# Patient Record
Sex: Female | Born: 1960 | Race: White | Hispanic: No | Smoking: Former smoker
Health system: Southern US, Community
[De-identification: ages and names within clinical notes are randomized; demographics above are authoritative.]

## PROBLEM LIST (undated history)

## (undated) DIAGNOSIS — I491 Atrial premature depolarization: Secondary | ICD-10-CM

## (undated) DIAGNOSIS — I493 Ventricular premature depolarization: Secondary | ICD-10-CM

## (undated) DIAGNOSIS — M545 Low back pain, unspecified: Secondary | ICD-10-CM

## (undated) DIAGNOSIS — I509 Heart failure, unspecified: Secondary | ICD-10-CM

## (undated) DIAGNOSIS — J40 Bronchitis, not specified as acute or chronic: Secondary | ICD-10-CM

## (undated) DIAGNOSIS — I38 Endocarditis, valve unspecified: Secondary | ICD-10-CM

## (undated) DIAGNOSIS — F419 Anxiety disorder, unspecified: Secondary | ICD-10-CM

## (undated) DIAGNOSIS — J9611 Chronic respiratory failure with hypoxia: Secondary | ICD-10-CM

## (undated) DIAGNOSIS — Z8701 Personal history of pneumonia (recurrent): Secondary | ICD-10-CM

## (undated) DIAGNOSIS — R002 Palpitations: Secondary | ICD-10-CM

## (undated) DIAGNOSIS — R001 Bradycardia, unspecified: Secondary | ICD-10-CM

## (undated) DIAGNOSIS — M5030 Other cervical disc degeneration, unspecified cervical region: Secondary | ICD-10-CM

## (undated) DIAGNOSIS — H9190 Unspecified hearing loss, unspecified ear: Secondary | ICD-10-CM

## (undated) DIAGNOSIS — I4719 Other supraventricular tachycardia: Secondary | ICD-10-CM

## (undated) DIAGNOSIS — E875 Hyperkalemia: Secondary | ICD-10-CM

## (undated) DIAGNOSIS — J449 Chronic obstructive pulmonary disease, unspecified: Secondary | ICD-10-CM

## (undated) DIAGNOSIS — I719 Aortic aneurysm of unspecified site, without rupture: Secondary | ICD-10-CM

## (undated) DIAGNOSIS — G8929 Other chronic pain: Secondary | ICD-10-CM

## (undated) DIAGNOSIS — W57XXXA Bitten or stung by nonvenomous insect and other nonvenomous arthropods, initial encounter: Secondary | ICD-10-CM

## (undated) DIAGNOSIS — I351 Nonrheumatic aortic (valve) insufficiency: Secondary | ICD-10-CM

## (undated) DIAGNOSIS — I2510 Atherosclerotic heart disease of native coronary artery without angina pectoris: Secondary | ICD-10-CM

## (undated) DIAGNOSIS — R0602 Shortness of breath: Secondary | ICD-10-CM

## (undated) DIAGNOSIS — R58 Hemorrhage, not elsewhere classified: Secondary | ICD-10-CM

## (undated) DIAGNOSIS — I4892 Unspecified atrial flutter: Secondary | ICD-10-CM

## (undated) DIAGNOSIS — R350 Frequency of micturition: Secondary | ICD-10-CM

## (undated) DIAGNOSIS — I5032 Chronic diastolic (congestive) heart failure: Secondary | ICD-10-CM

## (undated) DIAGNOSIS — I350 Nonrheumatic aortic (valve) stenosis: Secondary | ICD-10-CM

## (undated) DIAGNOSIS — I471 Supraventricular tachycardia: Secondary | ICD-10-CM

## (undated) DIAGNOSIS — R112 Nausea with vomiting, unspecified: Secondary | ICD-10-CM

## (undated) DIAGNOSIS — F32A Depression, unspecified: Secondary | ICD-10-CM

## (undated) DIAGNOSIS — M1990 Unspecified osteoarthritis, unspecified site: Secondary | ICD-10-CM

## (undated) DIAGNOSIS — F329 Major depressive disorder, single episode, unspecified: Secondary | ICD-10-CM

## (undated) DIAGNOSIS — I499 Cardiac arrhythmia, unspecified: Secondary | ICD-10-CM

## (undated) DIAGNOSIS — Z9889 Other specified postprocedural states: Secondary | ICD-10-CM

## (undated) DIAGNOSIS — G43909 Migraine, unspecified, not intractable, without status migrainosus: Secondary | ICD-10-CM

## (undated) DIAGNOSIS — N393 Stress incontinence (female) (male): Secondary | ICD-10-CM

## (undated) DIAGNOSIS — K219 Gastro-esophageal reflux disease without esophagitis: Secondary | ICD-10-CM

## (undated) HISTORY — DX: Chronic respiratory failure with hypoxia: J96.11

## (undated) HISTORY — PX: ABDOMINAL HYSTERECTOMY: SHX81

## (undated) HISTORY — DX: Unspecified atrial flutter: I48.92

## (undated) HISTORY — DX: Heart failure, unspecified: I50.9

## (undated) HISTORY — DX: Supraventricular tachycardia: I47.1

## (undated) HISTORY — DX: Hemorrhage, not elsewhere classified: R58

## (undated) HISTORY — DX: Chronic diastolic (congestive) heart failure: I50.32

## (undated) HISTORY — PX: TYMPANOSTOMY TUBE PLACEMENT: SHX32

## (undated) HISTORY — DX: Bitten or stung by nonvenomous insect and other nonvenomous arthropods, initial encounter: W57.XXXA

## (undated) HISTORY — DX: Endocarditis, valve unspecified: I38

## (undated) HISTORY — PX: ARTHROSCOPIC REPAIR ACL: SUR80

## (undated) HISTORY — DX: Other chronic pain: G89.29

## (undated) HISTORY — PX: TUBAL LIGATION: SHX77

## (undated) HISTORY — DX: Nonrheumatic aortic (valve) insufficiency: I35.1

## (undated) HISTORY — DX: Atherosclerotic heart disease of native coronary artery without angina pectoris: I25.10

## (undated) HISTORY — DX: Chronic obstructive pulmonary disease, unspecified: J44.9

## (undated) HISTORY — DX: Other supraventricular tachycardia: I47.19

## (undated) HISTORY — PX: ANTERIOR CRUCIATE LIGAMENT REPAIR: SHX115

## (undated) HISTORY — PX: KNEE ARTHROSCOPY: SUR90

## (undated) HISTORY — DX: Bradycardia, unspecified: R00.1

## (undated) HISTORY — DX: Low back pain, unspecified: M54.50

## (undated) HISTORY — PX: CHOLECYSTECTOMY: SHX55

## (undated) HISTORY — PX: MYRINGOPLASTY W/ FAT GRAFT: SHX2058

## (undated) HISTORY — DX: Hyperkalemia: E87.5

## (undated) HISTORY — PX: SIGMOIDOSCOPY: SUR1295

## (undated) HISTORY — PX: AORTIC VALVE REPLACEMENT: SHX41

## (undated) HISTORY — DX: Nonrheumatic aortic (valve) stenosis: I35.0

## (undated) HISTORY — DX: Ventricular premature depolarization: I49.3

## (undated) HISTORY — PX: DIAGNOSTIC LAPAROSCOPY: SUR761

## (undated) HISTORY — DX: Bronchitis, not specified as acute or chronic: J40

## (undated) HISTORY — DX: Migraine, unspecified, not intractable, without status migrainosus: G43.909

## (undated) HISTORY — DX: Palpitations: R00.2

## (undated) HISTORY — DX: Unspecified osteoarthritis, unspecified site: M19.90

## (undated) HISTORY — DX: Atrial premature depolarization: I49.1

---
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
## 1997-08-06 ENCOUNTER — Ambulatory Visit: Admission: RE | Admit: 1997-08-06 | Discharge: 1997-08-06 | Payer: Self-pay | Admitting: Pulmonary Disease

## 2000-08-21 ENCOUNTER — Ambulatory Visit (HOSPITAL_COMMUNITY): Admission: RE | Admit: 2000-08-21 | Discharge: 2000-08-21 | Payer: Self-pay | Admitting: Cardiology

## 2000-12-29 ENCOUNTER — Ambulatory Visit (HOSPITAL_COMMUNITY): Admission: RE | Admit: 2000-12-29 | Discharge: 2000-12-29 | Payer: Self-pay | Admitting: Pulmonary Disease

## 2002-09-20 ENCOUNTER — Ambulatory Visit (HOSPITAL_COMMUNITY): Admission: RE | Admit: 2002-09-20 | Discharge: 2002-09-20 | Payer: Self-pay | Admitting: Cardiology

## 2002-09-20 ENCOUNTER — Ambulatory Visit (HOSPITAL_COMMUNITY): Admission: RE | Admit: 2002-09-20 | Discharge: 2002-09-20 | Payer: Self-pay | Admitting: Pulmonary Disease

## 2002-12-19 ENCOUNTER — Encounter: Payer: Self-pay | Admitting: Emergency Medicine

## 2002-12-19 ENCOUNTER — Emergency Department (HOSPITAL_COMMUNITY): Admission: EM | Admit: 2002-12-19 | Discharge: 2002-12-19 | Payer: Self-pay | Admitting: Emergency Medicine

## 2003-03-01 HISTORY — PX: TONSILLECTOMY: SUR1361

## 2004-03-05 ENCOUNTER — Ambulatory Visit (HOSPITAL_COMMUNITY): Admission: RE | Admit: 2004-03-05 | Discharge: 2004-03-05 | Payer: Self-pay | Admitting: Pulmonary Disease

## 2004-10-19 ENCOUNTER — Ambulatory Visit: Payer: Self-pay | Admitting: Cardiology

## 2004-11-04 ENCOUNTER — Ambulatory Visit: Payer: Self-pay | Admitting: Cardiology

## 2004-11-04 ENCOUNTER — Ambulatory Visit: Payer: Self-pay

## 2005-09-09 ENCOUNTER — Ambulatory Visit: Payer: Self-pay | Admitting: Gastroenterology

## 2005-09-19 ENCOUNTER — Ambulatory Visit: Payer: Self-pay | Admitting: Gastroenterology

## 2005-09-19 ENCOUNTER — Ambulatory Visit (HOSPITAL_COMMUNITY): Admission: RE | Admit: 2005-09-19 | Discharge: 2005-09-19 | Payer: Self-pay | Admitting: Gastroenterology

## 2005-10-13 ENCOUNTER — Ambulatory Visit (HOSPITAL_COMMUNITY): Admission: RE | Admit: 2005-10-13 | Discharge: 2005-10-13 | Payer: Self-pay | Admitting: Gastroenterology

## 2005-10-13 HISTORY — PX: COLONOSCOPY: SHX174

## 2005-11-09 ENCOUNTER — Encounter
Admission: RE | Admit: 2005-11-09 | Discharge: 2006-02-07 | Payer: Self-pay | Admitting: Physical Medicine & Rehabilitation

## 2005-11-09 ENCOUNTER — Ambulatory Visit: Payer: Self-pay | Admitting: Physical Medicine & Rehabilitation

## 2005-12-13 ENCOUNTER — Ambulatory Visit: Payer: Self-pay | Admitting: Gastroenterology

## 2005-12-20 ENCOUNTER — Ambulatory Visit: Payer: Self-pay | Admitting: Cardiology

## 2005-12-29 ENCOUNTER — Ambulatory Visit: Payer: Self-pay | Admitting: Cardiology

## 2006-01-02 ENCOUNTER — Ambulatory Visit: Payer: Self-pay | Admitting: Cardiology

## 2006-01-12 ENCOUNTER — Ambulatory Visit: Payer: Self-pay | Admitting: Cardiology

## 2006-01-17 ENCOUNTER — Ambulatory Visit: Payer: Self-pay | Admitting: Gastroenterology

## 2006-01-26 ENCOUNTER — Ambulatory Visit: Payer: Self-pay | Admitting: Physical Medicine & Rehabilitation

## 2006-02-07 ENCOUNTER — Ambulatory Visit: Payer: Self-pay | Admitting: Cardiology

## 2006-02-16 ENCOUNTER — Encounter (INDEPENDENT_AMBULATORY_CARE_PROVIDER_SITE_OTHER): Payer: Self-pay | Admitting: Gastroenterology

## 2006-02-16 ENCOUNTER — Ambulatory Visit: Payer: Self-pay | Admitting: Gastroenterology

## 2006-03-02 ENCOUNTER — Ambulatory Visit: Payer: Self-pay | Admitting: Gastroenterology

## 2006-05-26 ENCOUNTER — Encounter
Admission: RE | Admit: 2006-05-26 | Discharge: 2006-08-24 | Payer: Self-pay | Admitting: Physical Medicine & Rehabilitation

## 2006-05-30 ENCOUNTER — Ambulatory Visit: Payer: Self-pay | Admitting: Physical Medicine & Rehabilitation

## 2006-07-19 ENCOUNTER — Ambulatory Visit: Payer: Self-pay | Admitting: Gastroenterology

## 2006-08-23 ENCOUNTER — Ambulatory Visit: Payer: Self-pay | Admitting: Cardiology

## 2006-09-12 ENCOUNTER — Encounter: Payer: Self-pay | Admitting: Cardiology

## 2006-09-12 ENCOUNTER — Ambulatory Visit: Payer: Self-pay

## 2006-09-19 ENCOUNTER — Ambulatory Visit: Payer: Self-pay | Admitting: Cardiology

## 2006-09-19 LAB — CONVERTED CEMR LAB
BUN: 14 mg/dL (ref 6–23)
Bilirubin, Direct: 0.1 mg/dL (ref 0.0–0.3)
Cholesterol: 239 mg/dL (ref 0–200)
GFR calc Af Amer: 87 mL/min
HDL: 40.9 mg/dL (ref >39.0)
Potassium: 3.7 meq/L (ref 3.5–5.1)
Total Bilirubin: 1.2 mg/dL (ref 0.3–1.2)
Total CHOL/HDL Ratio: 5.8

## 2007-07-30 ENCOUNTER — Ambulatory Visit: Payer: Self-pay | Admitting: Cardiology

## 2007-08-08 ENCOUNTER — Ambulatory Visit: Payer: Self-pay

## 2007-08-08 ENCOUNTER — Ambulatory Visit: Payer: Self-pay | Admitting: Cardiology

## 2007-08-08 ENCOUNTER — Encounter: Payer: Self-pay | Admitting: Cardiology

## 2007-08-08 LAB — CONVERTED CEMR LAB
AST: 19 units/L (ref 0–37)
Albumin: 3.8 g/dL (ref 3.5–5.2)
Alkaline Phosphatase: 59 units/L (ref 39–117)
BUN: 12 mg/dL (ref 6–23)
Bilirubin, Direct: 0.1 mg/dL (ref 0.0–0.3)
CO2: 30 meq/L (ref 19–32)
Creatinine, Ser: 0.9 mg/dL (ref 0.4–1.2)
Glucose, Bld: 78 mg/dL (ref 70–99)
LDL Cholesterol: 77 mg/dL (ref 0–99)
Potassium: 4.5 meq/L (ref 3.5–5.1)
Sodium: 137 meq/L (ref 135–145)
Total Bilirubin: 0.7 mg/dL (ref 0.3–1.2)
Total CHOL/HDL Ratio: 3.4
Total Protein: 6.7 g/dL (ref 6.0–8.3)
Triglycerides: 158 mg/dL — ABNORMAL HIGH (ref 0–149)
VLDL: 32 mg/dL (ref 0–40)

## 2007-09-06 ENCOUNTER — Ambulatory Visit: Payer: Self-pay | Admitting: Cardiology

## 2007-10-04 ENCOUNTER — Ambulatory Visit: Payer: Self-pay | Admitting: Cardiology

## 2008-04-03 ENCOUNTER — Ambulatory Visit: Payer: Self-pay | Admitting: Cardiology

## 2008-04-18 ENCOUNTER — Emergency Department (HOSPITAL_COMMUNITY): Admission: EM | Admit: 2008-04-18 | Discharge: 2008-04-18 | Payer: Self-pay | Admitting: Emergency Medicine

## 2008-04-27 ENCOUNTER — Emergency Department (HOSPITAL_COMMUNITY): Admission: EM | Admit: 2008-04-27 | Discharge: 2008-04-27 | Payer: Self-pay | Admitting: Emergency Medicine

## 2008-04-28 ENCOUNTER — Emergency Department (HOSPITAL_COMMUNITY): Admission: EM | Admit: 2008-04-28 | Discharge: 2008-04-28 | Payer: Self-pay | Admitting: Emergency Medicine

## 2008-07-18 ENCOUNTER — Encounter: Admission: RE | Admit: 2008-07-18 | Discharge: 2008-07-18 | Payer: Self-pay | Admitting: Orthopedic Surgery

## 2008-08-21 DIAGNOSIS — J45909 Unspecified asthma, uncomplicated: Secondary | ICD-10-CM | POA: Insufficient documentation

## 2008-08-21 DIAGNOSIS — R002 Palpitations: Secondary | ICD-10-CM | POA: Insufficient documentation

## 2008-08-21 DIAGNOSIS — J449 Chronic obstructive pulmonary disease, unspecified: Secondary | ICD-10-CM | POA: Insufficient documentation

## 2008-08-21 DIAGNOSIS — J40 Bronchitis, not specified as acute or chronic: Secondary | ICD-10-CM | POA: Insufficient documentation

## 2008-08-21 DIAGNOSIS — M129 Arthropathy, unspecified: Secondary | ICD-10-CM | POA: Insufficient documentation

## 2008-08-21 DIAGNOSIS — G43909 Migraine, unspecified, not intractable, without status migrainosus: Secondary | ICD-10-CM | POA: Insufficient documentation

## 2008-08-21 DIAGNOSIS — I38 Endocarditis, valve unspecified: Secondary | ICD-10-CM | POA: Insufficient documentation

## 2008-08-21 DIAGNOSIS — I509 Heart failure, unspecified: Secondary | ICD-10-CM | POA: Insufficient documentation

## 2008-08-29 ENCOUNTER — Ambulatory Visit: Payer: Self-pay | Admitting: Cardiology

## 2008-08-29 DIAGNOSIS — R0989 Other specified symptoms and signs involving the circulatory and respiratory systems: Secondary | ICD-10-CM

## 2008-08-29 DIAGNOSIS — R0609 Other forms of dyspnea: Secondary | ICD-10-CM | POA: Insufficient documentation

## 2008-08-29 DIAGNOSIS — R0602 Shortness of breath: Secondary | ICD-10-CM | POA: Insufficient documentation

## 2008-09-09 ENCOUNTER — Telehealth (INDEPENDENT_AMBULATORY_CARE_PROVIDER_SITE_OTHER): Payer: Self-pay

## 2008-09-10 ENCOUNTER — Encounter: Payer: Self-pay | Admitting: Cardiology

## 2008-09-10 ENCOUNTER — Ambulatory Visit: Payer: Self-pay

## 2008-12-16 ENCOUNTER — Ambulatory Visit: Payer: Self-pay | Admitting: Pain Medicine

## 2008-12-24 ENCOUNTER — Ambulatory Visit: Payer: Self-pay | Admitting: Pain Medicine

## 2008-12-24 ENCOUNTER — Encounter: Payer: Self-pay | Admitting: Cardiology

## 2009-01-27 ENCOUNTER — Ambulatory Visit: Payer: Self-pay | Admitting: Pain Medicine

## 2009-02-02 ENCOUNTER — Ambulatory Visit: Payer: Self-pay | Admitting: Pain Medicine

## 2009-02-25 ENCOUNTER — Ambulatory Visit: Payer: Self-pay | Admitting: Cardiology

## 2009-02-25 DIAGNOSIS — F17200 Nicotine dependence, unspecified, uncomplicated: Secondary | ICD-10-CM | POA: Insufficient documentation

## 2009-02-25 DIAGNOSIS — I712 Thoracic aortic aneurysm, without rupture, unspecified: Secondary | ICD-10-CM | POA: Insufficient documentation

## 2009-03-05 ENCOUNTER — Ambulatory Visit: Payer: Self-pay | Admitting: Pain Medicine

## 2009-07-16 ENCOUNTER — Ambulatory Visit (HOSPITAL_COMMUNITY): Admission: RE | Admit: 2009-07-16 | Discharge: 2009-07-16 | Payer: Self-pay | Admitting: Pulmonary Disease

## 2009-08-13 ENCOUNTER — Ambulatory Visit: Payer: Self-pay | Admitting: Cardiology

## 2009-09-22 ENCOUNTER — Encounter (INDEPENDENT_AMBULATORY_CARE_PROVIDER_SITE_OTHER): Payer: Self-pay | Admitting: *Deleted

## 2009-09-22 ENCOUNTER — Encounter: Payer: Self-pay | Admitting: Cardiology

## 2009-09-22 ENCOUNTER — Ambulatory Visit: Payer: Self-pay

## 2009-09-22 ENCOUNTER — Ambulatory Visit: Payer: Self-pay | Admitting: Cardiovascular Disease

## 2009-09-22 ENCOUNTER — Ambulatory Visit (HOSPITAL_COMMUNITY): Admission: RE | Admit: 2009-09-22 | Discharge: 2009-09-22 | Payer: Self-pay | Admitting: Cardiology

## 2009-10-16 ENCOUNTER — Ambulatory Visit (HOSPITAL_COMMUNITY): Admission: RE | Admit: 2009-10-16 | Discharge: 2009-10-16 | Payer: Self-pay | Admitting: Pulmonary Disease

## 2010-02-12 ENCOUNTER — Ambulatory Visit: Payer: Self-pay | Admitting: Cardiology

## 2010-02-12 ENCOUNTER — Encounter: Payer: Self-pay | Admitting: Cardiology

## 2010-03-21 ENCOUNTER — Encounter: Payer: Self-pay | Admitting: Pulmonary Disease

## 2010-04-01 NOTE — Assessment & Plan Note (Signed)
Summary: per checko ut/sf  Medications Added ZYRTEC ALLERGY 10 MG TABS (CETIRIZINE HCL) 1 tab once daily FLEXERIL 10 MG TABS (CYCLOBENZAPRINE HCL) 2 tab at bedtime HYDROXYZINE HCL 10 MG TABS (HYDROXYZINE HCL) 1 tab once daily QVAR 40 MCG/ACT AERS (BECLOMETHASONE DIPROPIONATE) take 2 puffs once daily MOBIC 15 MG TABS (MELOXICAM) 1 tab once daily        Visit Type:  6 mo f/u Primary Provider:  Shaune Pollack MD  CC:  pt's weight is up 9 lb's since 01/2009.....sob...Marland KitchenMarland KitchenMarland Kitchenedema/feet/legs....cp.  History of Present Illness: Lindsay Bonilla returns today for followup of her multiple cardiac and pulmonary issues.  Please see assessment and plan.  Her dyspnea is actually improved and she is cut way back on smoking. She's also on Xolair which also seems to be helpful.  She denies any palpitations. She's had no syncope or presyncope. She denies any orthopnea, PND or edema.  Current Medications (verified): 1)  Singulair 10 Mg Tabs (Montelukast Sodium) .Marland Kitchen.. 1 Tab Once Daily 2)  Zyrtec Allergy 10 Mg Tabs (Cetirizine Hcl) .Marland Kitchen.. 1 Tab Once Daily 3)  Klor-Con M20 20 Meq Cr-Tabs (Potassium Chloride Crys Cr) .Marland Kitchen.. 1 Tab Two Times A Day 4)  Xolair 150 Mg Solr (Omalizumab) .Marland Kitchen.. 1 Injection 2 Times A Month 5)  Crestor 10 Mg Tabs (Rosuvastatin Calcium) .Marland Kitchen.. 1 Tab Once Daily 6)  Lasix 40 Mg Tabs (Furosemide) .Marland Kitchen.. 1 Tab Two Times A Day 7)  Symbicort 160-4.5 Mcg/act Aero (Budesonide-Formoterol Fumarate) .... Use As Directed 8)  Nexium 40 Mg Cpdr (Esomeprazole Magnesium) .Marland Kitchen.. 1 Tab Two Times A Day 9)  Epipen 0.3 Mg/0.91ml (1:1000) Devi (Epinephrine Hcl (Anaphylaxis)) .... As Needed 10)  Optivar 0.05 % Soln (Azelastine Hcl) .... As Needed 11)  Hydrocodone-Acetaminophen 10-500 Mg Tabs (Hydrocodone-Acetaminophen) .... As Needed 12)  Replax 40mg  .... As Needed 13)  Xanax 1 Mg Tabs (Alprazolam) .... As Needed 14)  Flexeril 10 Mg Tabs (Cyclobenzaprine Hcl) .... 2 Tab At Bedtime 15)  Hydroxyzine Hcl 10 Mg Tabs (Hydroxyzine  Hcl) .Marland Kitchen.. 1 Tab Once Daily 16)  Ventolin Hfa 108 (90 Base) Mcg/act Aers (Albuterol Sulfate) .... Use As Directed 17)  Sertraline Hcl 100 Mg Tabs (Sertraline Hcl) .Marland Kitchen.. 1 Tab Once Daily 18)  Amoxicillin 500 Mg Caps (Amoxicillin) .... Take As Directed For Dental Appt 19)  Qvar 40 Mcg/act Aers (Beclomethasone Dipropionate) .... Take 2 Puffs Once Daily 20)  Mobic 15 Mg Tabs (Meloxicam) .Marland Kitchen.. 1 Tab Once Daily  Allergies: 1)  ! Cleocin 2)  ! Septra 3)  ! Cipro 4)  ! * Avelox 5)  ! Beta Blockers 6)  ! * Bee Stings 7)  ! * Peanuts 8)  ! 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 09/19/2005 Colonoscopy....10/13/2005 aortic valve replacement Cholecystectomy hysterectomy Knee Arthroscopy Tonsillectomy tubal ligation   Family History: Last updated: 09/08/2008 Father: Deceased at 7 ? heart attack Family History of Cancer: Mother..type unkown  Social History: Last updated: 2008/09/08 Tobacco Use - Yes.  Alcohol Use - yes Drug Use - no  Risk Factors: Smoking Status: current (Sep 08, 2008)  Review of Systems       negative other than history of present illness  Vital Signs:  Patient profile:   50 year old female Height:  64 inches Weight:      165 pounds BMI:     28.42 Pulse rate:   74 / minute Pulse rhythm:   irregular BP sitting:   122 / 60  (left arm) Cuff size:   large  Vitals Entered By: Lindsay Bonilla, CMA (August 13, 2009 1:55 PM)  Physical Exam  General:  looks lower than stated age, Head:  normocephalic and atraumatic Eyes:  PERRLA/EOM intact; conjunctiva and lids  normal. Neck:  Neck supple, no JVD. No masses, thyromegaly or abnormal cervical nodes. Chest [name]:  no deformities or breast masses noted Lungs:  less rhonchi and wheezes in usual, decreased breath sounds throughout Heart:  PMI nondisplaced, regular rate and rhythm, normally splitting S2 with aspiration, 3/6 diastolic murmur along the left upper sternal border. No gallop Abdomen:  Bowel sounds positive; abdomen soft and non-tender without masses, organomegaly, or hernias noted. No hepatosplenomegaly. Msk:  Back normal, normal gait. Muscle strength and tone normal. Pulses:  pulses normal in all 4 extremities Extremities:  No clubbing or cyanosis. Neurologic:  Alert and oriented x 3. Skin:  Intact without lesions or rashes. Psych:  Normal affect.   Impression & Recommendations:  Problem # 1:  VALVULAR HEART DISEASE (ICD-424.90) Assessment Unchanged her That diastolic murmur sounds worse. Obtain 2-D echocardiogram has been a year. No change in treatment today. 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 1 tab two times a day  Problem # 3:  * PULMONARY VALVE HOMOGRAFT Assessment: Unchanged  Problem # 4:  TOBACCO USER (ICD-305.1) Assessment: Improved  Problem # 5:  DYSPNEA ON EXERTION (ICD-786.09) Assessment: Improved  Her updated medication list for this problem includes:    Lasix 40 Mg Tabs (Furosemide) .Marland Kitchen... 1 tab two times a day  Problem # 6:  ASCENDING AORTIC ANEURYSM (ICD-441.2) Assessment: Unchanged Ascending aorta dilatation on last echo last year. We'll reevaluate with repeat echo  Other Orders: EKG w/ Interpretation (93000)  Patient Instructions: 1)  Your physician recommends that you schedule a follow-up appointment in: 6 MONTHS WITH DR Kristjan Derner 2)  Your physician recommends that you continue on  your current medications as directed. Please refer to the Current Medication list given to you today. 3)  Your physician has requested that you have an echocardiogram.  Echocardiography is a painless test that uses sound waves to create images of your heart. It provides your doctor with information about the size and shape of your heart and how well your heart's chambers and valves are working.  This procedure takes approximately one hour. There are no restrictions for this procedure.

## 2010-04-01 NOTE — Assessment & Plan Note (Signed)
Summary: 6 m o f/u ./cy  Medications Added SYMBICORT 160-4.5 MCG/ACT AERO (BUDESONIDE-FORMOTEROL FUMARATE) 2 puffs two times a day PATADAY 0.2 % SOLN (OLOPATADINE HCL) as directed PERCOCET 10-325 MG TABS (OXYCODONE-ACETAMINOPHEN) as needed CYMBALTA 60 MG CPEP (DULOXETINE HCL) 1 cap once daily AMBIEN 10 MG TABS (ZOLPIDEM TARTRATE) 1 tab at bedtime OPANA ER 20 MG XR12H-TAB (OXYMORPHONE HCL) 1 tab two times a day METOPROLOL SUCCINATE 25 MG XR24H-TAB (METOPROLOL SUCCINATE) Take one tablet by mouth daily at bedtime        Visit Type:  6 mo f/u Primary Provider:  Shaune Pollack MD  CC:  cold sweats...heart racing...sob...edema/hands yesterday.  History of Present Illness: Ms Lindsay Bonilla comes in today for nocturnal palpitations. There she says her heart races at night and she just can't get any sleep. Ambien is not even helping.  She remembers taking Toprol in the past which was the only thing that helped or palpitations. He is listed as an allergy but she denies any wheezing or any other symptoms of respiratory compromise. She really like to go back on it.  A- stress Myoview in July of 2010 was negative for ischemia and she had a stable echocardiogram in July. I reviewed these results with her. Her aortic insufficiency is only mild. Her ascending aortic root is stable. Findings compared to last year's echocardiogram.  She is now smoking a half-pack service a day as opposed to a couple of PACs. Encouraged her to quit.  Current Medications (verified): 1)  Singulair 10 Mg Tabs (Montelukast Sodium) .Marland Kitchen.. 1 Tab Once Daily 2)  Zyrtec Allergy 10 Mg Tabs (Cetirizine Hcl) .Marland Kitchen.. 1 Tab Once Daily 3)  Klor-Con M20 20 Meq Cr-Tabs (Potassium Chloride Crys Cr) .Marland Kitchen.. 1 Tab Two Times A Day 4)  Xolair 150 Mg Solr (Omalizumab) .Marland Kitchen.. 1 Injection 2 Times A Month 5)  Crestor 10 Mg Tabs (Rosuvastatin Calcium) .Marland Kitchen.. 1 Tab Once Daily 6)  Lasix 40 Mg Tabs (Furosemide) .Marland Kitchen.. 1 Tab Two Times A Day 7)  Symbicort 160-4.5 Mcg/act  Aero (Budesonide-Formoterol Fumarate) .... 2 Puffs Two Times A Day 8)  Nexium 40 Mg Cpdr (Esomeprazole Magnesium) .Marland Kitchen.. 1 Tab Two Times A Day 9)  Epipen 0.3 Mg/0.62ml (1:1000) Devi (Epinephrine Hcl (Anaphylaxis)) .... As Needed 10)  Pataday 0.2 % Soln (Olopatadine Hcl) .... As Directed 11)  Percocet 10-325 Mg Tabs (Oxycodone-Acetaminophen) .... As Needed 12)  Replax 40mg  .... As Needed 13)  Xanax 1 Mg Tabs (Alprazolam) .... As Needed 14)  Flexeril 10 Mg Tabs (Cyclobenzaprine Hcl) .... 2 Tab At Bedtime 15)  Hydroxyzine Hcl 10 Mg Tabs (Hydroxyzine Hcl) .Marland Kitchen.. 1 Tab Once Daily 16)  Ventolin Hfa 108 (90 Base) Mcg/act Aers (Albuterol Sulfate) .... Use As Directed 17)  Cymbalta 60 Mg Cpep (Duloxetine Hcl) .Marland Kitchen.. 1 Cap Once Daily 18)  Amoxicillin 500 Mg Caps (Amoxicillin) .... Take As Directed For Dental Appt 19)  Qvar 40 Mcg/act Aers (Beclomethasone Dipropionate) .... Take 2 Puffs Once Daily 20)  Mobic 15 Mg Tabs (Meloxicam) .Marland Kitchen.. 1 Tab Once Daily 21)  Ambien 10 Mg Tabs (Zolpidem Tartrate) .Marland Kitchen.. 1 Tab At Bedtime 22)  Opana Er 20 Mg Xr12h-Tab (Oxymorphone Hcl) .Marland Kitchen.. 1 Tab Two Times A Day  Allergies: 1)  ! Cleocin 2)  ! Septra 3)  ! Cipro 4)  ! * Avelox 5)  ! Beta Blockers 6)  ! * Bee Stings 7)  ! * Peanuts 8)  ! 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 09/19/2005 Colonoscopy....10/13/2005 aortic valve replacement Cholecystectomy hysterectomy Knee Arthroscopy Tonsillectomy tubal ligation   Family History: Last updated: 09-10-2008 Father: Deceased at 38 ? heart attack Family History of Cancer:  Mother..type unkown  Social History: Last updated: September 10, 2008 Tobacco Use - Yes.  Alcohol Use - yes Drug Use - no  Risk Factors: Smoking Status: current (09-10-08)  Review of Systems       negative other history of present illness  Clinical Reports Reviewed:  Nuclear Study:  09/10/2008:  Excerise capacity: Adenosine study with no exercise  Blood Pressure response: Normal blood pressure response  Clinical symptoms: Chest burn  ECG impression: No significant ST segment change suggestive of ischemia  Overall impression: The images are similar to 2006. There is no scar or ischemia. It is of interest that the nuclear uptake is not uniform. There is question of interference from visceral nulcear material. This gives the appearance of decreased activity in the anterior Tom Rags[place]. But there is no evidence that this is scar or ischemia  Willa Rough, MF, [facility]  11/04/2004:  Interpretation: 1. No clearly diagnostic ST-segment changes noted during adenosine infusion. The patient was symptomatic as reported. No sustained arrhythmias were noted. Small, fixed apical defect that is most likely due to attenuation artifact. There were no large reverisble perfusion defects to suggest ischemia. The ejction fraction is normal at 65%.  Jonelle Sidle, MD, [facility]   Vital Signs:  Patient profile:   50 year old female Height:      64 inches Weight:      164 pounds BMI:     28.25 Pulse rate:   73 / minute Pulse rhythm:   irregular BP sitting:   106 / 60  (left arm) Cuff size:   large  Vitals Entered By: Danielle Rankin, CMA (February 12, 2010 11:22 AM)  Physical Exam  General:  no acute distress, Head:  normocephalic and atraumatic Eyes:  PERRLA/EOM intact; conjunctiva and lids normal. Neck:  Neck supple, no JVD. No masses, thyromegaly or abnormal cervical nodes. Chest Kingsly Kloepfer:  no deformities or breast masses noted Lungs:  no wheezes or rhonchi, decreased breath sounds throughout Heart:  PMI  nondisplaced, regular rate and rhythm, minimal aortic insufficiency heard. S2 splits. No carotid bruits Msk:  Back normal, normal gait. Muscle strength and tone normal. Pulses:  pulses normal in all 4 extremities Extremities:  No clubbing or cyanosis. Neurologic:  Alert and oriented x 3. Skin:  Intact without lesions or rashes. Psych:  Normal affect.   Impression & Recommendations:  Problem # 1:  ASCENDING AORTIC ANEURYSM (ICD-441.2) Assessment Unchanged  Problem # 2:  TOBACCO USER (ICD-305.1) Assessment: Improved  Problem # 3:  DYSPNEA ON EXERTION (ICD-786.09) Assessment: Unchanged  Her updated medication list for this problem includes:    Lasix 40 Mg Tabs (Furosemide) .Marland Kitchen... 1 tab two times a day    Metoprolol Succinate 25 Mg Xr24h-tab (Metoprolol succinate) .Marland Kitchen... 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 1 tab two times a day    Metoprolol Succinate 25 Mg Xr24h-tab (Metoprolol  succinate) .Marland Kitchen... Take one tablet by mouth daily at bedtime  Problem # 6:  PALPITATIONS (ICD-785.1) Assessment: Deteriorated  We will try low-dose beta blocker with metoprolol succinate 25 mg q.h.s. If she has any wheezing she's been instructed to stop altogether.  Her updated medication list for this problem includes:    Metoprolol Succinate 25 Mg Xr24h-tab (Metoprolol succinate) .Marland Kitchen... Take one tablet by mouth daily at bedtime  Other Orders: EKG w/ Interpretation (93000)  Patient Instructions: 1)  Your physician recommends that you schedule a follow-up appointment in: 3 months with Dr. Daleen Squibb 2)  Your physician has recommended you make the following change in your medication:  3)  Your physician discussed the hazards of tobacco use.  Tobacco use  cessation is recommended and techniques and options to help you quit were discussed. Prescriptions: METOPROLOL SUCCINATE 25 MG XR24H-TAB (METOPROLOL SUCCINATE) Take one tablet by mouth daily at bedtime  #30 x 11   Entered by:   Lisabeth Devoid RN   Authorized by:   Gaylord Shih, MD, [facility]   Signed by:   Lisabeth Devoid RN on 02/12/2010   Method used:   Electronically to        [facility]* (retail)       [address]       [place], [place]  [address]       Ph: [phone]       Fax: [phone]   RxID:   [id number]

## 2010-04-01 NOTE — Letter (Signed)
Summary: Outpatient Coinsurance Notice  Outpatient Coinsurance Notice   Imported By: Marylou Mccoy 10/07/2009 17:46:23  _____________________________________________________________________  External Attachment:    Type:   Image     Comment:   External Document

## 2010-04-12 DIAGNOSIS — I359 Nonrheumatic aortic valve disorder, unspecified: Secondary | ICD-10-CM

## 2010-05-13 ENCOUNTER — Ambulatory Visit (INDEPENDENT_AMBULATORY_CARE_PROVIDER_SITE_OTHER): Payer: Medicaid Other | Admitting: Cardiology

## 2010-05-13 ENCOUNTER — Encounter: Payer: Self-pay | Admitting: Cardiology

## 2010-05-13 DIAGNOSIS — F17200 Nicotine dependence, unspecified, uncomplicated: Secondary | ICD-10-CM

## 2010-05-13 DIAGNOSIS — R079 Chest pain, unspecified: Secondary | ICD-10-CM | POA: Insufficient documentation

## 2010-05-18 NOTE — Assessment & Plan Note (Signed)
Summary: f50m/dfg/agh  Medications Added HYDROXYZINE HCL 10 MG TABS (HYDROXYZINE HCL) 1 tab every 6 hours as needed      Allergies Added:   Visit Type:  3 mo follow up, [facility] Primary Provider:  Shaune Pollack MD  CC:  chest pain and pressure few weeks ago. sob. Marland Kitchen  History of Present Illness: [name] returns after post hospital a [facility]. She presented ith [facility] that awoke her during the night. She was admitted and ruled out. She signed out AMA because dissatisfaction with care. I have received no records. She was told she ruled out for MI.  She has had no further CP. She has been under a tremendous amount of stress.  Current Medications (verified): 1)  Singulair 10 Mg Tabs (Montelukast Sodium) .Marland Kitchen.. 1 Tab Once Daily 2)  Zyrtec Allergy 10 Mg Tabs (Cetirizine Hcl) .Marland Kitchen.. 1 Tab Once Daily 3)  Klor-Con M20 20 Meq Cr-Tabs (Potassium Chloride Crys Cr) .Marland Kitchen.. 1 Tab Two Times A Day 4)  Xolair 150 Mg Solr (Omalizumab) .Marland Kitchen.. 1 Injection 2 Times A Month 5)  Crestor 10 Mg Tabs (Rosuvastatin Calcium) .Marland Kitchen.. 1 Tab Once Daily 6)  Lasix 40 Mg Tabs (Furosemide) .Marland Kitchen.. 1 Tab Two Times A Day 7)  Symbicort 160-4.5 Mcg/act Aero (Budesonide-Formoterol Fumarate) .... 2 Puffs Two Times A Day 8)  Nexium 40 Mg Cpdr (Esomeprazole Magnesium) .Marland Kitchen.. 1 Tab Two Times A Day 9)  Epipen 0.3 Mg/0.77ml (1:1000) Devi (Epinephrine Hcl (Anaphylaxis)) .... As Needed 10)  Pataday 0.2 % Soln (Olopatadine Hcl) .... As Directed 11)  Percocet 10-325 Mg Tabs (Oxycodone-Acetaminophen) .... As Needed 12)  Replax 40mg  .... As Needed 13)  Xanax 1 Mg Tabs (Alprazolam) .... As Needed 14)  Flexeril 10 Mg Tabs (Cyclobenzaprine Hcl) .... 2 Tab At Bedtime 15)  Hydroxyzine Hcl 10 Mg Tabs (Hydroxyzine Hcl) .Marland Kitchen.. 1 Tab Every 6 Hours As Needed 16)  Ventolin Hfa 108 (90 Base) Mcg/act Aers (Albuterol Sulfate) .... Use As Directed 17)  Cymbalta 60 Mg Cpep (Duloxetine Hcl) .Marland Kitchen.. 1 Cap Once Daily 18)  Amoxicillin 500 Mg Caps (Amoxicillin) .... Take As  Directed For Dental Appt 19)  Qvar 40 Mcg/act Aers (Beclomethasone Dipropionate) .... Take 2 Puffs Once Daily 20)  Mobic 15 Mg Tabs (Meloxicam) .Marland Kitchen.. 1 Tab Once Daily 21)  Ambien 10 Mg Tabs (Zolpidem Tartrate) .Marland Kitchen.. 1 Tab At Bedtime 22)  Opana Er 20 Mg Xr12h-Tab (Oxymorphone Hcl) .Marland Kitchen.. 1 Tab Two Times A Day 23)  Metoprolol Succinate 25 Mg Xr24h-Tab (Metoprolol Succinate) .... Take One Tablet By Mouth Daily At Bedtime  Allergies (verified): 1)  ! Cleocin 2)  ! Septra 3)  ! Cipro 4)  ! * Avelox 5)  ! Beta Blockers 6)  ! * Bee Stings 7)  ! * Peanuts 8)  ! 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 09/19/2005 Colonoscopy....10/13/2005 aortic valve replacement Cholecystectomy hysterectomy Knee Arthroscopy Tonsillectomy tubal ligation   Family History: Last updated: 26-Aug-2008 Father: Deceased at 47 ? heart attack Family History of Cancer: Mother..type unkown  Social History: Last updated: 2008/08/26 Tobacco Use - Yes.  Alcohol Use - yes Drug Use - no  Risk Factors: Smoking Status: current (08/26/2008)  Review of Systems       negative other than HPI  Vital Signs:  Patient profile:   50 year old female Height:      64 inches Weight:      166.75 pounds BMI:     28.73 Pulse rate:   60 / minute Resp:     18 per minute BP sitting:   102 / 70  (left arm) Cuff size:   large  Vitals Entered By: Celestia Khat, CMA (May 13, 2010 11:32 AM)  Physical Exam  General:  NAD Head:  normocephalic and atraumatic Eyes:  PERRLA/EOM intact; conjunctiva and lids normal. Neck:  Neck supple,  no JVD. No masses, thyromegaly or abnormal cervical nodes. Lungs:  INSP EXP RHONCHI Heart:  RRR, NL S1 AND S2 , No AI heard, no bruits Msk:  Back normal, normal gait. Muscle strength and tone normal. Pulses:  pulses normal in all 4 extremities Extremities:  No clubbing or cyanosis. Neurologic:  Alert and oriented x 3. Skin:  Intact without lesions or rashes. Psych:  anxious.     Problems:  Medical Problems Added: 1)  Dx of Chest Pain-unspecified  (ICD-786.50)  Impression & Recommendations:  Problem # 1:  CHEST PAIN-UNSPECIFIED (ICD-786.50) Assessment Improved Does not sound cardiac. EKG is stable today. Reassurance. Continue omeprazole and stop smoking. Her updated medication list for this problem includes:    Metoprolol Succinate 25 Mg Xr24h-tab (Metoprolol succinate) .Marland Kitchen... Take one tablet by mouth daily at bedtime  Orders: Echocardiogram (Echo)  Problem # 2:  TOBACCO USER (ICD-305.1) Assessment: Unchanged Encouraged to stop.  Problem # 3:  ASCENDING AORTIC ANEURYSM (ICD-441.2) Assessment: Unchanged No AI heard. Post stenotic dilatation. 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 1 tab two times a day    Metoprolol Succinate 25 Mg Xr24h-tab (Metoprolol succinate) .Marland Kitchen... Take one tablet by mouth daily at bedtime  Patient Instructions: 1)  Your physician recommends that you schedule a follow-up appointment in: July 2012 with Dr. Daleen Squibb 2)  Your physician recommends that you continue on your current medications as directed. Please refer to the Current Medication list given to you today. 3)  Your physician has requested that you have an echocardiogram.  Echocardiography is a painless test that uses sound waves to create images of your heart. It  provides your doctor with information about the size and shape of your heart and how well your heart's chambers and valves are working.  This procedure takes approximately one hour. There are no restrictions for this procedure. SAME DAY AS APPT WITH DR. Daleen Squibb

## 2010-06-15 LAB — CBC
HCT: 41.7 % (ref 36.0–46.0)
Hemoglobin: 14.2 g/dL (ref 12.0–15.0)
RBC: 4.63 MIL/uL (ref 3.87–5.11)

## 2010-06-15 LAB — DIFFERENTIAL: Lymphocytes Relative: 40 % (ref 12–46)

## 2010-07-13 NOTE — Assessment & Plan Note (Signed)
A 50 year old female returns today who had sacroiliac injections at last  visit on June 15, 2006.  She states that she actually felt more pain  for a couple of days after the injection but now it has eased off.  She  has some good days, some bad days, and in fact today she feels like her  pain is zero out of 10 at rest but may increase up to 8 out of 10 with  activity.  She has had no significant relief from prior medial branch  blocks as well as L2-L3 transluminal lumbar epidural steroid injections.  She did have an L4-L5 transluminal injection which gave her 2 weeks of  pain relief.  She has had spine surgery evaluation and not felt to be a  good candidate for surgical intervention.   She has had no new medical complications in the interval time.  She  would like, however, for our office to take over pain management in  terms of medication prescriptions.   ALLERGIES:  1. CIPRO.  2. PERCOCET.  3. AVELOX.  4. SEPTRA.  5. CLEOCIN.   She states her current medication for pain is hydrocodone 10/500 q.i.d.  but has not brought in her last prescription bottle.  She is using a  TENS unit at home and has done some physical therapy and has noted this  has been helpful.   PHYSICAL EXAMINATION:  GENERAL:  No acute distress.  Mood and affect  appropriate.  BACK:  Some mild tenderness in PSIS area bilaterally.  She has pain with  forward flexion and extension.  Lower extremity strength and range of  motion is normal and deep tendon reflexes are normal.   IMPRESSION:  Lumbar pain.  It does not appear to be facet mediated.  She  had some relief with sacroiliac injection but it is difficult to say  whether this is true pain generator.  In fact, no obvious pain generator  identified thus far.  She has not had any diskogram but given that she  is felt to need surgery at this time, would not order this to further  evaluate for diskogenic type pain.   PLAN:  1. We will go ahead and continue  physical therapy.  2. We will get a urine drug screen and if showing just the prescribed      and no illicit drugs or undisclosed opiates, can assume      prescription and adjust meds as needed.  3. Given her pain is more episodic, would not use a long-acting      narcotic analgesic.  4. I will see her back in approximately one month.  5. She will bring in her pill bottles next week for the nurses to do a      check on.      Erick Colace, M.D.  Electronically Signed     AEK/MedQ  D:  07/10/2006 16:25:10  T:  07/10/2006 23:28:50  Job #:  [id number]   cc:   Ramon Dredge L. Juanetta Gosling, M.D.  Fax: [phone]

## 2010-07-13 NOTE — Assessment & Plan Note (Signed)
[facility]                            CARDIOLOGY OFFICE NOTE   Lindsay, Bonilla                       MRN:          [id number]  DATE:07/30/2007                            DOB:          1960/12/18    Lindsay Bonilla comes in today for followup.   1. History of congenital bicuspid aortic valve stenosis status post      Ross procedure at [place] in 1998.  A 2-D echocardiogram a year ago      was stable with mild aortic insufficiency.  No significant gradient      across the aortic valve or the pulmonic valve.  Her left      ventricular chamber size was normal as was the function.  There was      mild aortic root dilatation with ascending aortic diameter of 41      mmHg.  There was mild mitral regurgitation.  She is having some      atypical chest pain well localized under the left breast.  This is      not exertion related.  She has dyspnea on exertion, but she is a      heavy smoker and has a history of asthma.  2. History of tachy palpitations under good control.  3. Lower extremity edema, well controlled with Lasix.  4. Hyperlipidemia.  She is now taking Crestor and has been intolerant      of other statins in the past.  She is due lipids.  5. History of asthma.  She has been advised not to take beta blockers.   CURRENT MEDICATIONS:  1. Singulair 10 mg a day.  2. Xyzal 5 mg q.a.m.  3. Potassium 20 mEq b.i.d.  4. Zegerid 2 daily.  5. Pulmicort 1 mg 1 p.o. daily.  6. Xolair shots two times a month.  7. Diltiazem extended release 120 daily.  8. Symbicort b.i.d.  9. Lasix 40 mg p.o. b.i.d.  10.Crestor 10 mg daily.   PHYSICAL EXAMINATION:  VITAL SIGNS:  Blood pressure 110/64, pulse 72 and  regular.  Weight is 166.  Respiratory rate is 20.  HEENT:  Normocephalic atraumatic.  PERRLA.  Extraocular movements  intact.  Sclerae are slightly injected.  Facial symmetry is normal.  Dentition is satisfactory.  She is extremely tan from a tanning bed.  GENERAL:   She smells heavily of cigarettes.  NECK:  Carotid upstrokes were equal bilaterally without bruits, no JVD.  Thyroid is not enlarged.  Neck is supple.  LUNGS:  Revealed inspiratory and expiratory rhonchi.  HEART:  Reveals a nondisplaced PMI.  She has a tambouric S2 with a  diastolic murmur that is louder than I remember.  It is along the left  sternal border and right upper sternal border.  S2 does split with  respiration.  There is no obvious gallop.  ABDOMEN:  Soft, good bowel sounds.  No midline bruit.  No hepatomegaly.  EXTREMITIES:  No cyanosis, clubbing or edema.  Pulses are intact.  NEUROLOGIC:  Exam is intact.   This EKG shows normal sinus rhythm with a rightward axis, right  atrial  enlargement with a pulmonary disease pattern.  There has been no  substantial change from before.   ASSESSMENT AND PLAN:  I am very concerned about this more intense  diastolic murmur.  I am worried she may have more aortic insufficiency.   PLAN:  1. A 2-D echocardiogram.  2. Fasting comprehensive metabolic panel and lipids.  3. I will see her back in several weeks so we can discuss this.  We      will call with results on an interim basis.     Thomas C. Daleen Squibb, MD, [facility]  Electronically Signed    TCW/MedQ  DD: 07/30/2007  DT: 07/30/2007  Job #: [id number]   cc:   Ramon Dredge L. Juanetta Gosling, M.D.

## 2010-07-13 NOTE — Assessment & Plan Note (Signed)
Lindsay Bonilla                            CARDIOLOGY OFFICE NOTE   Lindsay Bonilla, Lindsay Bonilla                       MRN:          [id number]  DATE:10/04/2007                            DOB:          [birth year]    Miel comes in today for followup of her palpitations.  These are  mostly at night.   We placed her on verapamil at 180 mg nightly.  She has had significant  improvement.   She is also walking on a regular basis.  Her family thinks she is over-  walking.   Her blood pressure today is 124/82, her pulse is 84 and regular.  Her  weight is 163, down 6.  GENERAL APPEARANCE:  She looks remarkably good.  LUNGS:  Her physical examination is remarkable for mild  inspiratory/expiratory rhonchi.  HEART:  Regular rate and rhythm.  No gallop.  ABDOMEN:  Abdominal exam is soft.  EXTREMITIES:  There is no edema.  Pulses are intact.   I am delighted that she has gotten a good response to verapamil.  We  have made no changes.  I will see her back in 6 months.     Thomas C. Daleen Squibb, MD, [facility]  Electronically Signed    TCW/MedQ  DD: 10/04/2007  DT: 10/05/2007  Job #: [id number]   cc:   Ramon Dredge L. Juanetta Gosling, M.D.

## 2010-07-13 NOTE — Assessment & Plan Note (Signed)
[place] [place] [facility]                         GASTROENTEROLOGY OFFICE NOTE   Lindsay Bonilla, Lindsay Bonilla                       MRN:          [id number]  DATE:07/19/2006                            DOB:          1960-09-21    Lindsay Bonilla comes in on May 21.  She says she has been having some choking  feeling in her throat.  She says her symptoms have gotten worse  recently.  We talked about her last endoscopic procedure, which revealed  a 2 cm hernia but either with the Schatzki's ring or the stricture.  There is scar tissue, which I think is probably more likely she had some  mild enteritis.  This was done in December of last year.  She has had  this before.  I told her that being in [place], that she could get the  barium swallow tablet there, and she was satisfied with that.  Then if  she needed an endoscopic procedure there, she could get it done by Dr.  Karilyn Cota, or she could come back there, that we would be more than happy  to do it for her.  She seemed to be satisfied with this.  I told I had  the utmost confidence in Dr. Karilyn Cota and that he could certainly do a  satisfactory procedure for her if she was so inclined.   PHYSICAL EXAMINATION:  GENERAL:  She looks about the same.  VITAL SIGNS:  Weight 166.  Blood pressure 120/72.  Pulse 80 and regular.  NECK:  Unremarkable.  HEART:  A grade 2/3 systolic ejection murmur.  EXTREMITIES:  Unremarkable.   IMPRESSION:  1. Gastroesophageal reflux disease with dysphagia related to the      hiatal hernia and stricture.  2. Irritable bowel syndrome with minimal diverticular changes.  3. Aortic valve replacement.  4. Status post hysterectomy and tubal ligation.  5. Multiple allergies.  6. Arthritis.  7. Asthma.  8. History of anxiety and depression.  9. Gastroesophageal reflux disease with dysphagia, as described.  10.History of rectal prolapse.   RECOMMENDATIONS:  Get the barium swallow, as prescribed, and have the  procedure  done, if necessary, as determined from the barium swallow, as  noted.     Ulyess Mort, MD  Electronically Signed    SML/MedQ  DD: 07/19/2006  DT: 07/20/2006  Job #: [id number]

## 2010-07-13 NOTE — Assessment & Plan Note (Signed)
[facility]                            CARDIOLOGY OFFICE NOTE   NAME:ODELL, DANISSA RUNDLE                       MRN:          [id number]  DATE:09/06/2007                            DOB:          Apr 18, 1960    Kealohilani comes back in today to review the results of her echocardiogram  and also because of increased palpitations particularly at night when  she is lying in a tanning bed.   She cannot take beta-blockers because of the history of bronchospasm and  asthma.  Diltiazem really did not help her in the past.   Her echocardiogram was surprisingly stable.  She does have mild-to-  moderate aortic insufficiency, which is louder on exam.  Her pulmonic  valve is stable.  Her LV function is normal.   MEDICATIONS:  1. She currently takes Symbicort 160/4.5 two puffs b.i.d.  2. Lasix 40 mg p.o. b.i.d.  3. Crestor 10 mg a day.  4. Xolair shots 2 times a month.  5. Pulmicort 1 mg q.a.m.  6. Zegerid 2 daily.  7. Potassium 20 mEq b.i.d.  8. Xyzal 5 mg q.a.m.  9. Singulair 10 mg a day.   Her last lipids showed a total cholesterol of 154; triglycerides 158;  and HDL 45, which is actually an improvement.  LDL was 77, this was on  Crestor, which has done a great job for her.  LFTs and chemistries were  normal.  Her potassium was specifically 4.5.   PHYSICAL EXAMINATION:  GENERAL:  Her exam compared to her last exam is  unchanged.  VITAL SIGNS:  Her blood pressure is 115/72, her pulse is 85 and regular,  and weight is 169 up 3.  LUNGS:  Decreased breath sounds, but no rhonchi or wheezes.  HEART:  Regular rate and rhythm.  No extrasystoles.  EXTREMITIES:  No edema.  Pulses were intact.   Unfortunately, I think Persis will not tolerate beta-blockers with her  pulmonary disease.  Diltiazem did not work in the past.  We will try  verapamil SR 180 at bedtime since they happen mostly at night.  I have  told her that hopefully this will help about 75%, but not to  expect  total cessation.   I will see her back in about 4 weeks for careful followup.     Thomas C. Daleen Squibb, MD, [facility]  Electronically Signed    TCW/MedQ  DD: 09/06/2007  DT: 09/06/2007  Job #: [id number]

## 2010-07-13 NOTE — Assessment & Plan Note (Signed)
[facility]                            CARDIOLOGY OFFICE NOTE   Lindsay Bonilla, Lindsay Bonilla                       MRN:          [id number]  DATE:04/03/2008                            DOB:          Sep 30, 1960    Lindsay Bonilla comes in today.  She has been under a tremendous amount of  stress having had her husband leave her on New Year's Day.  She has  other issues at home including a challenging situation with her mentally  challenged son.   She has been smoking a lot more than usual.   She complains of intermittent numbness in all of her fingers in her left  hand.  It is usually in the tips.  Sometimes, it extends up into the  arm.  She has no other associated neurological symptoms.   She carries a diagnosis of valvular heart disease.  Specifically, she  has a history of severe aortic stenosis with bicuspid valve and has had  a previous Ross procedure at [place] in 1998.  Her last echo showed mild-to-  moderate aortic insufficiency with no significant stenosis.  She has a  pulmonary valve homograft.  There was no significant gradient.  EF was  55-60%.   She has had no previous history of stroke or TIAs.  She has no known  carotid disease.  Again, she does smoke, however.  There is no major  history of hypertension.   The verapamil caused her to feel nauseous.  She quit it.  She is having  some palpitations, but this does not seem to be a major issue.   Her meds are unchanged from last visit except she is no longer on  verapamil.   PHYSICAL EXAMINATION:  She looks really tired and stressed.  Her blood  pressure is 122/74.  She has a ruddy complexion.  Her hands and feet  have a very healthy red color tone.  Blood pressure is 122/74, pulse 70  and regular, weight is 168.  HEENT is normal, nonfocal exam.  Neck is  supple.  Carotid upstrokes were equal bilaterally without bruits.  No  JVD.  Thyroid is not enlarged.  Lungs are clear with some expiratory  rhonchi.  She has good pulses in both upper extremities.  There is no  obvious subclavian bruits.  Heart reveals a nondisplaced PMI.  She has a  soft systolic murmur along the left sternal border.  She has a diastolic  murmur that is short, left upper sternal border.  S2 splits.  There is  no right ventricular lift.  Abdominal exam is soft.  Good bowel sounds.  No midline bruits.  Extremities reveal no cyanosis, clubbing, or edema.  Pulses are present but reduced.  She is independent rubor.  There is no  sign of DVT.  Skin is unremarkable.   ASSESSMENT AND PLAN:  I am very concerned that Lindsay Bonilla could be having  some intermittent transient ischemic attacks they could be lacunar in  nature.  This could very well be from her smoking, since she is really  not hypertensive or diabetic.  Her heart is stable at this point in  time.   She stopped taking her aspirin which I have asked her to renew at 81 mg  a day.  She has been told that if she has prolonged numbness in her hand  or her body to report to the emergency room.  I have asked her to  decrease her smoking again.  We have made no other changes in her  medical program.  I will plan on seeing her back again in 6 months.     Thomas C. Daleen Squibb, MD, [facility]  Electronically Signed    TCW/MedQ  DD: 04/03/2008  DT: 04/03/2008  Job #: [id number]

## 2010-07-13 NOTE — Assessment & Plan Note (Signed)
[facility]                            CARDIOLOGY OFFICE NOTE   Lindsay Bonilla, Lindsay Bonilla                       MRN:          [id number]  DATE:08/23/2006                            DOB:          21-Jan-1961    Lindsay Bonilla comes in for management and followup of the following issues.  1. History of congenital aortic stenosis status post Ross procedure at      [facility] in 1998.  Her last 2D echocardiogram was stable.  She is due a      followup.  She has normal left ventricular function and relative      hypokinesia to the septum, probably post surgical.  2. History of tachy palpitations, which have been under fairly good      control.  3. Lower extremity edema requiring sometimes twice a day Lasix.  She      is on potassium supplementation.  4. Hyperlipidemia.  She has been intolerant of Statins and is no      longer taking same.  5. History of asthma.  She has been advised not to take beta blockers.   She had taken verapamil in the past for her tachy palpitations.  She  says it makes her nauseated.  Will try Diltiazem.  She has multiple drug  allergies.   MEDICATIONS:  1. Singulair 10 mg a day.  2. Xyzal 5 mg 1 p.o. q. a.m.  3. Viramist 2 puffs daily.  4. Potassium 20 mEq b.i.d.  5. Lasix 40 mg p.o. daily.  6. Zegerid 40 mg 2 daily.  7. Albuterol neb p.r.n.  8. Pulmicort neb 1 mg p.o. q. a.m.  9. Brovana nebulizer 2 times a day.  10.Xolair shots 2 times a month.  11.She carries an EpiPen.   EXAM:  Blood pressure is 113/70, pulse is 81 and regular, weight is 164,  which is significantly up.  HEENT:  Normocephalic, atraumatic.  PERRLA.  Extraocular movements are  intact.  Sclerae clear.  Facial symmetry is normal.  Dentition is  satisfactory.  Carotid upstrokes are equal bilaterally without bruits.  No JVD.  Thyroid is not enlarged.  Trachea is midline.  LUNGS:  Clear with no rhonchi or wheezes.  HEART:  Normal S1, S2.  No obvious aortic  insufficiency.  She has a soft  systolic murmur along the left sternal border.  ABDOMEN:  Soft with good bowel sounds.  No midline bruit.  No  hepatomegaly.  EXTREMITIES:  No cyanosis, clubbing, or edema.  Pulses are intact.  NEURO:  Intact.  SKIN:  Markedly tan.  No other lesions.   ELECTROCARDIOGRAM:  Essentially normal, except for a rightward axis and  poor R wave progression across the anterior precordium.  This is  unchanged.   ASSESSMENT AND PLAN:  Lindsay Bonilla is doing well.  She really wants to take  Lasix sometimes twice a day because of lower extremity edema.  I have  told her when she does that to take an extra 20 mEq of potassium.   Because she cannot take a beta blocker, but is having palpitations, I  have prescribed Diltiazem XR 120 mg p.o. daily.  Verapamil makes her  nauseated.  I hope she can tolerate this.   I have also arranged for a 2D echocardiogram to follow up on her  congenital aortic stenosis and Ross procedure.   Assuming she is stable, I will see her back in a year.     Thomas C. Daleen Squibb, MD, [facility]  Electronically Signed    TCW/MedQ  DD: 08/23/2006  DT: 08/23/2006  Job #: [id number]   cc:   Ramon Dredge L. Juanetta Gosling, M.D.

## 2010-07-16 NOTE — Assessment & Plan Note (Signed)
[place]                         GASTROENTEROLOGY OFFICE NOTE   JEANNETTA, CERUTTI                       MRN:          [id number]  DATE:03/02/2006                            DOB:          11-25-1960    Lindsay Bonilla comes in March 02, 2006, after endoscopy and sigmoidoscopy.  I  reviewed all of this with her and her findings.  We were not sure what  we would find since she said she was so sleepy.  I told her about her  hiatal hernia and her stricture and/or Schatzki's ring, the gastritis or  antritis, and I explained to her about the ring.  I also told her about  the colon exam being normal as she was worried but that is well.  She  says she is doing fairly well.  She said that she had been taking  Zegerid.  She does not have any more left.  She does get some burping  and belching and we told her that maybe besides the Zegerid we could add  a little Reglan, 1/2 before meals and see if this would give her any  more satisfaction.  I treated her previously with some Xifaxan and some  Flora-Q.  I told her to try the above measures and see how they work and  to let me know what is not improved.  She seemed to be satisfied with  this.  She does take her other medicines including:  Benadryl, Xanax,  Relpax, hydrocodone, Lasix, Flovent, ProAir, Toprol, and potassium as  well as Lasix.     Ulyess Mort, MD  Electronically Signed    SML/MedQ  DD: 03/02/2006  DT: 03/02/2006  Job #: [id number]   cc:   Thomas C. Wall, MD, [facility]

## 2010-07-16 NOTE — Procedures (Signed)
Lindsay Bonilla, Lindsay Bonilla                ACCOUNT NO.:  [id number]   MEDICAL RECORD NO.:  [id number]          PATIENT TYPE:  REC   LOCATION:  [facility]                          FACILITY:  [facility]   PHYSICIAN:  Erick Colace, M.D.DATE OF BIRTH:  11-Nov-1960   DATE OF PROCEDURE:  12/05/2005  DATE OF DISCHARGE:                                 OPERATIVE REPORT   PROCEDURE:  L3-4 left paramedian lumbar epidural steroid injection under  fluoroscopic guidance, contrast-enhanced.   INDICATIONS:  Back pain, mainly axial, only partially relieved by oral  medications.  No significant improvements with lumbar facet injections.   Pain level is 6/10.   Informed consent was obtained after describing risks and benefits of the  procedure with the patient.  These include bleeding, bruising, infection,  loss of bowel and bladder function, temporary or permanent paralysis.  She  elects to proceed and has given written consent.   The patient was placed prone on fluoroscopy table, Betadine prep, sterile  drape.  A 25-gauge 1-1/2 inch needle was used to anesthetize the skin and  subcu with 1% lidocaine x2 mL.  Then an 18-gauge Hustead needle was inserted  into the L4-5 translaminar space, paramedian approach.  AP and lateral  imaging utilized.  Loss of resistance utilized, positive loss of resistance  at 7 cm.  Omnipaque 180 under live fluoroscopy demonstrated a good epidural  spread, no intravascular uptake.  Then a solution containing 1.5 mL of 40  mg/mL Depo-Medrol and 2 mL of 1% MPF lidocaine were injected.  The patient  tolerated the procedure well.  Postinjection pain level was 1/10.  Return in  3-4 weeks for repeat injection.  I have written for her to be off her  classes until I see her back and repeat injection if needed.  Postinjection  instructions given.      Erick Colace, M.D.  Electronically Signed     AEK/MEDQ  D:  12/05/2005 11:32:19  T:  12/06/2005 11:52:57  Job:  [id number]   cc:    Benn Moulder, M.D.   Edward L. Juanetta Gosling, M.D.  Fax: [phone]

## 2010-07-16 NOTE — Group Therapy Note (Signed)
MEDICAL RECORD NUMBER:  [id number]   [name] is a 50 year old female with history of low back pain that she  states is about 20 years in duration.  She does not indicate any abrupt  onset to this pain.  She states that it has gotten worse over the last year  or so.  She indicates there is a pain that is centered in the low back which  intermittently is severe, but she has a constant and less severe pain in the  mid back region.  She states her pain is a 10/10 on average and interferes  with general activity, relationship with others and enjoyment of life at a  9/10 level.  Her sleep is poor.  She walks without an assistive device.  She  drives, she is not employed.  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  She needs assistance with shopping and household duties  but is able to do all her basic self-care.   PAST MEDICAL HISTORY:  As noted above.  Aortic valve replacement in 1998.  Hysterectomy in 1998.  She had ACL repair 2000 on the right, gallbladder  surgery 2006.   SOCIAL HISTORY:  She was married in June, lives with her son.  She smokes a  half a pack a day.   FAMILY HISTORY:  Heart disease and diabetes.  Mother died of cancer  recently.   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  Her thoracic MRI was report unremarkable March 13, 2004; no  significant cord abnormalities or significant abnormalities noted in the  spine.  Her lumbar MRI March 17, 2005, showed superior endplate  abnormalities L1 and L3 thought to be Schmorl's nodes, no disk herniations.  I do not have the original films on these.   She has undergone orthopedic evaluation and has been sent for spine  injections.   EXAMINATION:  Blood pressure 126/86, pulse 80, respirations 18, O2  saturation 98% room air.  In general, no acute distress, mood and affect  appropriate, although frustrated.   Her back has tenderness at the lumbosacral  junction with increases with  extension and is not painful with forward flexion; in fact, she is able to  touch her toes but can lean backward only slightly.  She is able to toe-walk  and heel-walk.  She has full strength in the lower extremities and negative  straight leg raising sign.  She has normal deep tendon reflexes and normal  sensation.   IMPRESSION:  Lumbar axial pain, no radicular component.  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 L4-5 and L5-  S1.   PLAN:  Will do lumbar facet medial branch blocks diagnostically and add  corticosteroid to the solution for a therapeutic effect.  I will see her  back neck week for this, given that she does not have a driver for today's  appointment.      Erick Colace, M.D.  Electronically Signed     AEK/MedQ  D:  11/10/2005 17:56:21  T:  11/11/2005 17:06:00  Job #:  [id number]

## 2010-07-16 NOTE — Procedures (Signed)
NAMEPAULENA, SERVAIS                ACCOUNT NO.:  [id number]   MEDICAL RECORD NO.:  [id number]          PATIENT TYPE:  REC   LOCATION:  [facility]                          FACILITY:  [facility]   PHYSICIAN:  Erick Colace, M.D.DATE OF BIRTH:  [birth year]   DATE OF PROCEDURE:  06/15/2006  DATE OF DISCHARGE:                               OPERATIVE REPORT   PROCEDURE:  Bilateral sacroiliac injection under fluoroscopic guidance.   INDICATIONS:  Buttocks pain unrelieved by hydrocodone.  Has pain with  favor maneuver.  Informed consent was obtained after describing the  risks.  The pain is inhibiting her ability to participate in physical  therapy.   Informed consent was obtained after describing risks and benefits of the  procedure to the patient including bleeding, bruising, infection, loss  of bowel and bladder function, temporary or permanent paralysis.  She  elects proceed and has given her consent.   DESCRIPTION OF PROCEDURE:  The patient placed prone on fluoroscopy  table.  Betadine prep, sterile drape.  Because of discomfort with the  prone position, we added another pillow which helped somewhat Betadine  prep, sterile drape.  A 25-gauge inch and a half needle was used to  incise skin and subcutaneous tissue and  2 mL of lidocaine at each of  two sites.  Then a 25-gauge Tuohy two and half inch spinal needle was  inserted under fluoroscopic guidance.  First is the left SI joint.  AP  and lateral imaging demonstrated proper needle location.  Omnipaque 18 x  1.5 mL demonstrated good joint outline.  No intravascular uptake.  Then  a solution containing 1/2 mL of 40 mL Depo-Medrol and 1 mL of 2% MPF  lidocaine was injected.  The same procedure was repeated on the right  side using same equipment and technique as well as injectate.  The  patient tolerated procedure well.  Preinjection pain level 5/10.  Post  injection 10/10 which she attributes to lying on her stomach.  Will have  her keep  track of her pain.  Follow up in 3 weeks.  Possible  reinjection.  If this procedure was not helpful, would with consider  intracuticular facet injections rather than medial branch blocks given  they were not particularly helpful last time.      Erick Colace, M.D.  Electronically Signed     AEK/MEDQ  D:  06/15/2006 09:37:55  T:  06/15/2006 14:09:17  Job:  [id number]

## 2010-07-16 NOTE — Consult Note (Signed)
NAMELILLION, Lindsay                ACCOUNT NO.:  [id number]   MEDICAL RECORD NO.:  [id number]           PATIENT TYPE:  AMB   LOCATION:                                FACILITY:  [facility]   PHYSICIAN:  Kassie Mends, M.D.      DATE OF BIRTH:  1960-08-24   DATE OF CONSULTATION:  DATE OF DISCHARGE:                                   CONSULTATION   Dear Dr. Ralph Dowdy:   I am seeing Lindsay Bonilla as a new the patient in consultation per your request.  I am seeing her for rectal bleeding.   Lindsay Bonilla is a 50 year old female who has had rectal bleeding off and on  since 1998.  She has complaints of bulging in her rectum when she has a  bowel movement.  She reports being told she had colitis in the past.  She  sees blood when she wipes often.  She sees blood in the toilet one to two  times a month.  She denies any diarrhea or constipation.  She has to one to  two bowel movements a day, and then perhaps couple of days later she will  have no bowel movement.  She has some mild abdominal pain off and on,  associated with her ovarian cyst, and sexual intercourse.  She denies any  rectal pain.  She had an intentional 60-pound weight loss.  She attributes  her rectal itching to a yeast infection.Marland Kitchen   PAST MEDICAL HISTORY:  1.  Asthma.  2.  Allergies.  3.  Ovarian cyst.  4.  Hyperlipidemia.  5.  Anxiety.  6.  Gallstones.  7.  History of rectocele.   PAST SURGICAL HISTORY:  1.  Open heart surgery for valvular disease.  2.  Hysterectomy, with rectocele repair.  3.  Cholecystectomy.  4.  Right knee surgery.   She is allergic to CIPRO, SEPTRA, and CLEOCIN.   MEDICATION LIST:  1.  Lasix 40 mg a day.  2.  Flovent.  3.  Maxair.  4.  Xanax 1 mg 1 q.i.d.  5.  Crestor.  6.  Benadryl.  7.  Vicodin 10/500, 1 q. 6 h. as needed.  8.  Relpax.  9.  Allergy Shots.   She has no family history of colon polyps.  She had an uncle who had colon  cancer at an unknown age.  She denies any breast or uterine cancer in  the  family.  She had a maternal aunt with ovarian cancer.  Her father died at 22  from an MI, and her mother diet at 37 from lung cancer.  She has three  children and is married.  She has been disabled from her heart disease and  asthma.  She smokes half a pack a day.  She denies any alcohol use.   REVIEW OF SYSTEMS:  Per the HPI. Otherwise, all systems are negative.   PHYSICAL EXAMINATION:  VITAL SIGNS:  Weight is 165 pounds, height 5 feet 5  inches, temperature 98, blood pressure 122/80,  pulse 70.  GENERAL:  She is in no apparent  distress, and alert and oriented x4.  HEENT:  Atraumatic, normocephalic.  Pupils equal and reactive to light.  Mouth:  No  oral lesions. Posterior pharynx without erythema or exudate.  NECK:  Full  range of motion, and no lymphadenopathy.  LUNGS:  Clear to auscultation  bilaterally.CARDIOVASCULAR:  Regular rhythm, no murmur, normal S1 and S2.  ABDOMEN:  Bowel sounds are present, soft, nontender, nondistended.  No  rebound or guarding, no hepatosplenomegaly.  NEUROLOGIC:  She has no focal  neurologic deficits.EXTREMITIES:  Without cyanosis, clubbing, or edema.  RECTAL:  Positive for internal hemorrhoids visualized with the Valsalva  manuever, heme negative, no masses felt, good sphincter tone.   Lindsay Bonilla is a 50 year old female who has had rectal bleeding for 8-9 years.  The differential diagnosis includes internal hemorrhoids and rectal polyp  and a low likelihood of proctitis.  Thank you for allowing me to see [name] in consultation.  My list of recommendations follows.   I recommend taken to [facility] on today.  She should be scheduled for a flexible  sigmoidoscopy.  That will be arranged for next week.  I recommend stool  softeners 100 mg b.i.d., and she may titrate to have soft stool.  I do not  want her to strain, and I explained that to her, to have a bowel movement.  She will follow up in 3 months.   Please feel free to contact me with additional  questions, [phone].      Kassie Mends, M.D.  Electronically Signed     SM/MEDQ  D:  09/09/2005  T:  09/09/2005  Job:  [id number]   cc:   Ernestina Penna  Fax: [phone]

## 2010-07-16 NOTE — Procedures (Signed)
Lindsay Bonilla, MORDECAI                           ACCOUNT NO.:  [id number]   MEDICAL RECORD NO.:  [id number]                    PATIENT TYPE:  OUT   LOCATION:                                       FACILITY:   PHYSICIAN:  Vida Roller, M.D.                DATE OF BIRTH:   DATE OF PROCEDURE:  09/20/2002  DATE OF DISCHARGE:                                  ECHOCARDIOGRAM   TAPE NUMBER:  [id number]   TAPE COUNT:  [id number]   CLINICAL INFORMATION:  This is a 50 year old woman status post a Ross  procedure for congenital aortic stenosis.  This is a follow up  echocardiogram.   TECHNICAL QUALITY:  Adequate.   M-MODE TRACINGS:  1. The aorta is 36 mm.  2. The left atrium is 33 mm.  3. The septum is 12 mm.  4. The left ventricular posterior wall is 10 mm.  5. The left ventricular diastolic dimension is 44 mm.  6. The left ventricular systolic dimension is 28 mm.   2-D AND DOPPLER IMAGING:  1. The left ventricle is normal size.  The left ventricular function     estimated at 50-55%.  There is evidence of paradoxical septal motion     which may be related to a bundle branch block.  The septum, however,     thickens normally.  Diastolic function was not assessed.  2. The right ventricle is normal size with normal systolic function.  3. Both atria appear to be normal size.  4. The aortic valve appears to be bioprosthetic.  There is mild     insufficiency in a linear manner from 9 o'clock to 3 o'clock on the clock     face in the short access view.  No stenosis is seen.  5. The mitral valve is morphologically unremarkable with trace-to-mild     insufficiency.  No stenosis is seen.  6. The tricuspid valve is not well seen.  7. The pulmonic valve is not well seen.  8. The ascending aorta appears to be mildly-to-moderately dilated.  It is     clearly postoperative. I am uncertain as to whether or not there was a     conduit placed here.  9.     The pericardial surfaces appear normal.  10.       The inferior vena cava is normal size.  11.      In review of the previous echocardiogram there appears to be a mild     decrement in left ventricular function.                                               Vida Roller, M.D.    JH/MEDQ  D:  09/20/2002  T:  09/20/2002  Job:  [id number]

## 2010-07-16 NOTE — Procedures (Signed)
Lindsay Bonilla, Lindsay Bonilla                ACCOUNT NO.:  [id number]   MEDICAL RECORD NO.:  [id number]          PATIENT TYPE:  REC   LOCATION:  [facility]                          FACILITY:  [facility]   PHYSICIAN:  Erick Colace, M.D.DATE OF BIRTH:  25-Sep-1960   DATE OF PROCEDURE:  11/14/2005  DATE OF DISCHARGE:                                 OPERATIVE REPORT   PROCEDURE:  Bilateral L4 and L3 medial branch blocks as well as bilateral L5  dorsal ramus injection under fluoroscopic guidance.   INDICATIONS:  Low back pain, axial, with facet arthropathy noted, L3-4, L4-  5, on prior lumbar MR in 2004, no radicular discomfort.  The pain is only  partially responsive to medication management.  She is not taking any  anticoagulants or any antibiotics at the current time.   Informed consent was obtained after describing risks and benefits of the  procedure to the patient.  These include bleeding, bruising, loss of bowel  and bladder function, temporary or permanent paralysis.  She elected to  proceed and has given her consent.   The patient was placed prone on the fluoroscopy table, Betadine prep and  sterile drape.  A 25-gauge 1-1/2 inch needle was used to anesthetize the  skin and subcu tissues.  Lidocaine 1% x2 mL at each site, then a 22-gauge 3-  1/2 inch spinal needle was inserted under fluoroscopic guidance, first  targeting the left S1 SAP-sacral ala junction, bone contact made, confirmed  with lateral imaging.  Omnipaque 180 x0.5 mL demonstrated no intravascular  uptake.  Then 0.5 mL of a Depo-Medrol-lidocaine solution was injected.  Next, the right S1 SAP-transverse process junction was targeted, bone  contact made, confirmed with lateral imaging, Omnipaque 180 x0.5 mL  demonstrated no intravascular uptake.  Then 0.5 mL of the Depo-Medrol-  lidocaine solution injected.  Next the right L5 SAP-transverse process  junction targeted, bone contact made, confirmed with lateral imaging.  Omnipaque  180 x0.5 mL demonstrated no intravascular uptake.  Then 0.5 mL of  the Depo-Medrol-lidocaine solution was injected.  Next the right L4 SAP-  transverse process junction targeted, bone contact made, confirmed with  lateral imaging, Omnipaque 180 0.5 mL demonstrated no intravascular uptake.  Then 0.5 mL of the Depo-Medrol-lidocaine solution was injected.  Then the  left L4 SAP-transverse process junction targeted and bone contact made,  confirmed with lateral imaging.  Omnipaque 180 x0.5 mL demonstrated no  intravascular uptake.  Then 0.5 mL of the Depo-Medrol-lidocaine solution  injected.  Then the left L5 SAP-transverse process junction targeted and  bone contact made, confirmed with lateral imaging.  Omnipaque 180 x0.5 mL  demonstrated no intravascular uptake.  Then 0.5 mL of the Depo-Medrol-  lidocaine solution was injected.  The patient tolerated the procedure well.  Post injection instructions were given.      Erick Colace, M.D.  Electronically Signed     AEK/MEDQ  D:  11/14/2005 14:31:06  T:  11/15/2005 11:34:32  Job:  [id number]

## 2010-07-16 NOTE — Assessment & Plan Note (Signed)
[facility]                              CARDIOLOGY OFFICE NOTE   Lindsay Bonilla, Lindsay Bonilla                       MRN:          [id number]  DATE:01/12/2006                            DOB:          [birth year]    Lindsay Bonilla comes in today for further management and followup of the  following issues:   PROBLEMS:  1. Tachy palpitations.  She was seen on December 20, 2005 at our office      here and was found to have a potassium of 3.1.  After this was      replaced, felt that the rapid heart rate decreased, but she is still      having palpitations.  It keeps her awake at night, and she is not      sleeping well.  2. History of congenital aortic stenosis, status post Ross procedure at      [place] in 1998.  She recently had a stress echo of her chest pain in [facility]      by Dr. Diona Browner on January 02, 2006.  This showed normal left      ventricular function, relative hypokinesia of the anterior septum,      probably post surgical.  No obvious aortic valve problems.  No evidence      of ischemia with dobutamine infusion.  3. Hyperlipidemia:  Treated with Crestor.  Lipitor at goal recently.  4. Chronic back pain.  5. History of asthma.   She had an endoscopy about three months ago in [place].  Ever since then, she  has had abdominal pain and diarrhea.  She has lost weight.  She seems very  distressed and depressed.   Of note, she did have a TSH and BMP checked in October, which were within  normal limits except for the potassium.  A follow-up potassium was at 4.1.  This was after potassium supplementation of 20 mEq b.i.d.   Her medicines today are Benadryl 50 mg p.o. q.i.d., allergy shots two times  a week, albuterol inhaler, Crestor 10 mg a day, Lasix 40 mg a day, Carafate  q.i.d., Zantac over-the-counter, potassium 20 mEq b.i.d.   PHYSICAL EXAMINATION:  VITAL SIGNS:  Her blood pressure today is 120/84.  Pulse is 90 and irregular.  She is having frequent PACs  just listening to  her.  Her weight is down 5 pounds since October to 146.  HEENT:  Normocephalic and atraumatic.  Extraocular movements are intact.  PERRLA.  She looks extremely tired.  Dentition satisfactory.  NECK:  Carotids are equal bilaterally without bruits.  No JVD.  Thyroid is  not enlarged.  LUNGS:  Clear.  HEART:  A systolic murmur along the left upper sternal border.  S2 splits.  There is a soft diastolic component.  There are frequent extra systoles.  ABDOMEN:  Soft with good bowel sounds.  There is no tenderness, no  hepatomegaly.  EXTREMITIES:  No clubbing, cyanosis or edema.  Pulses are intact.   I spent about 20 minutes talking with Lindsay Bonilla and her husband, per her  request.  I  have made the following recommendations:   RECOMMENDATIONS:  1. Second opinion from a gastroenterologist from our group.  She mentioned      this, and I have followed up on it.  2. Check potassium and magnesium today.  3. Metoprolol extended release 25 mg per day.  4. Discontinue Crestor.  It could be that this is causing some      extraordinary fatigue and possibly some GI side effects.  I told her      that if it was not markedly better in two weeks, it was not the      Crestor.   I am going to follow up with her in about 4-6 weeks to see how she is doing.     Thomas C. Daleen Squibb, MD, [facility]  Electronically Signed    TCW/MedQ  DD: 01/12/2006  DT: 01/12/2006  Job #: [id number]   cc:   Ramon Dredge L. Juanetta Gosling, M.D.

## 2010-07-16 NOTE — Op Note (Signed)
Lindsay Bonilla, Lindsay Bonilla                ACCOUNT NO.:  [id number]   MEDICAL RECORD NO.:  [id number]          PATIENT TYPE:  AMB   LOCATION:  DAY                           FACILITY:  [facility]   PHYSICIAN:  Kassie Mends, M.D.      DATE OF BIRTH:  04-10-1960   DATE OF PROCEDURE:  10/13/2005  DATE OF DISCHARGE:                                 OPERATIVE REPORT   PROCEDURE:  Sigmoidoscopy.   REFERRING PHYSICIAN:  Ernestina Penna, M.D.   INDICATIONS FOR EXAM:  [name] IS a 50 year old female with rectal  bleeding.   MEDICATIONS:  1. Demerol 150 mg IV.  2. Versed 10 mg IV.  3. Phenergan 50 mg IV.   FINDINGS:  The scope advanced into the sigmoid colon.  Even after large  doses of Demerol, Versed and Phenergan, the patient was unable to tolerate  the exam due to abdominal pain.  She requested the examination be stopped.  Norma sigmoid colon.   RECOMMENDATIONS:  1. Return for colonoscopy with propofol.  2. Follow up with Dr. Ralph Dowdy.   PROCEDURE TECHNIQUE:  Physical exam was performed.  Informed consent was  obtained from the patient after explaining the risks, benefits and  alternatives to the procedure.  The patient was connected to the monitoring  device and placed in the left lateral position.  Continuous oxygen was  provided by nasal cannula and IV medicine administered through an indwelling  cannula.  After rectal exam and sedation, the patient's rectum was intubated  and  the scope was advanced under direct visualization to the sigmoid colon.  The  scope was subsequently removed slowly by carefully examining the color,  texture, anatomy and integrity of the mucosa on the way out.  The patient  was recovered in the endoscopy suite and discharged to home in satisfactory  condition.      Kassie Mends, M.D.  Electronically Signed     SM/MEDQ  D:  10/13/2005  T:  10/13/2005  Job:  [id number]   cc:   Ernestina Penna  Fax: [phone]

## 2010-07-16 NOTE — Procedures (Signed)
Lindsay Bonilla, Lindsay Bonilla                ACCOUNT NO.:  [id number]   MEDICAL RECORD NO.:  [id number]          PATIENT TYPE:  REC   LOCATION:  [facility]                          FACILITY:  [facility]   PHYSICIAN:  Erick Colace, M.D.DATE OF BIRTH:  Jul 10, 1960   DATE OF PROCEDURE:  01/26/2006  DATE OF DISCHARGE:                               OPERATIVE REPORT   PROCEDURE:  L2-L3 translaminar lumbar epidural steroid injection under  fluoroscopic guidance.   REASON FOR PROCEDURE:  She has had prior L4-L5 injection which was  performed December 05, 2005.  She went from a 6 to a 1 and this was a 2-  week pain relief.  She does note some pain a little bit higher up in her  spine.  She had no show earlier this month as well.  She had facet  injections performed on November 14, 2005, without significant  improvement.   Informed consent was obtained after describing risks and benefits of the  procedure to the patient.  These include bleeding, bruising, infection,  loss of bowel and bladder function, temporary or paralysis.  She elects  to proceed and has given written consent.  The patient placed prone on  fluoroscopy table with Betadine prep and sterile drape.  A 25 gauge, 1.5-  inch needle was used to incise skin and subcutaneous tissue with 1%  lidocaine x2 mL.  Then an 18 gauge Hustead needle was inserted under  fluoroscopic guidance targeting the inferior lamina of L2.  Bone contact  made and needle redirected.  Loss of resistance technique utilized along  with lateral imaging.  Positive loss of resistance obtained.  Under live  fluoroscopy, 1.5 mL of Omnipaque 180 was injected showing good epidural  spread.  This was followed by an injection of solution containing 2 mL  of 40 mg/cc of Depo-Medrol and 2 mL of 1% preservative-free lidocaine  and 1 mL of normal saline preservative-free.  The patient tolerated the  procedure well.  She will follow up and further treatment will be  determined.      Erick Colace, M.D.  Electronically Signed     AEK/MEDQ  D:  01/26/2006 17:36:16  T:  01/26/2006 21:34:36  Job:  [id number]   cc:   [facility],    Edward L. Juanetta Gosling, M.D.  Fax: [phone]

## 2010-07-16 NOTE — Op Note (Signed)
NAMEQUANTIA, Bonilla                ACCOUNT NO.:  [id number]   MEDICAL RECORD NO.:  [id number]          PATIENT TYPE:  AMB   LOCATION:  DAY                           FACILITY:  [facility]   PHYSICIAN:  Kassie Mends, M.D.      DATE OF BIRTH:  December 24, 1960   DATE OF PROCEDURE:  10/13/2005  DATE OF DISCHARGE:                                 OPERATIVE REPORT   PROCEDURE:  Colonoscopy.   REFERRING PHYSICIAN:  Dr. Ralph Dowdy.   INDICATIONS FOR EXAMINATION:  [name] is a 50 year old female with rectal  bleeding since 1998.  She has never had a full colonoscopy.  She had an  attempt at colonoscopy with conscious sedation but was unable to be  adequately sedated.  She has been rescheduled on today for colonoscopy with  propofol.   MEDICATIONS:  Provided by anesthesia.   FINDINGS:  1. Normal colon without evidence of polyps, masses, inflammatory changes      or vascular ectasias.  2. Small internal hemorrhoids.   RECOMMENDATIONS:  1. High-fiber diet.  2. Screening colonoscopy in 10 years.  3. Follow up with Dr. Ralph Dowdy.   PROCEDURE TECHNIQUE:  A physical exam was performed, and informed consent  was obtained from the patient after explaining all the benefits, risks and  alternatives to the procedure.  The patient was connected to the monitoring  device and placed in the left lateral position after airway was secured by  anesthesia.  Continuous oxygen and IV sedation was provided by anesthesia.  After administration of sedation and rectal exam, the patient's rectum  was intubated, and the scope was advanced under direct visualization to the  cecum.  The scope was subsequently removed slowly by carefully examining the  color, texture, anatomy and integrity of the mucosa on the way out.  The  patient was recovered in the endoscopy suite and discharged home in  satisfactory condition.      Kassie Mends, M.D.  Electronically Signed     SM/MEDQ  D:  10/13/2005  T:  10/13/2005  Job:  [id number]   cc:   Ernestina Penna  Fax: [phone]

## 2010-07-16 NOTE — Assessment & Plan Note (Signed)
[facility]                           GASTROENTEROLOGY OFFICE NOTE   Lindsay Bonilla, Lindsay Bonilla                       MRN:          [id number]  DATE:01/17/2006                            DOB:          10/16/1960    This very nice patient was referred by Dr. Juanito Doom.  He has been following  her for her cardiac disorders.  She has a history of severe aortic stenosis  with a bicuspid aortic valve, and has undergone a Ross procedure at [facility].  This was done in 1998.  She has had some recent complaints of palpitation  intermittently.  When she was seen by Dr. Daleen Squibb recently she indicated that  she had been having some gnawing epigastric pain and diarrhea, with some  weight loss.  Because of this, Dr. Daleen Squibb has suggested she get further  consultation, and referred her to our GI division.  She did describe some  difficult studies done at [facility], which was humiliating to her, which  she was unhappy with, and thus she was hoping to see a different doctor.  In  any case, she notes GI symptoms going back to 2 years ago, when she had a  cholecystectomy, but she said she really has had no problems after the  surgical procedure.  She says her diarrhea did not start until a recent  colonoscopic examination, and she describes quite a difficult time with the  procedure, and it appears that she only had it partially completed when she  was brought to the hospital and put under full anesthesia for it to be  completed.  She describes her watery stools starting at that time.  She says  it occurs 2 to 3 times a day, always seems to depend on her eating.  She  says almost every time she eats this precipitates cramping abdominal pain  and diarrhea.  She has seen no blood in her stools, no mucus.  It is always  just watery, loose, and she has lost approximately 20 pounds since August,  with a decrease in her appetite.  She has noted some bright red blood per  rectum, but relates  this to probable hemorrhoidal causes.  She says that she  has pain over her entire stomach, tenderness, not in any one specific place.   As far as her upper gastrointestinal system is concerned, she denies any  heartburn, but she has had some nausea and chest pain for several weeks,  which has not been thought of as related to any cardiac condition.  For her  upper GI symptoms, she has tried Carafate which made her sick, and she says  Nexium was of no help to her as far as reducing her symptoms.   PAST MEDICAL HISTORY:  Reveals that she has had some arthritis, asthma, had  a number of allergies, is status post cholecystectomy as we indicated,  hysterectomy, a tubal ligation, and she has had the Ross heart valve surgery  as we described as well.   SOCIAL HISTORY:  Reveals she does smoke a half pack to a pack a day, even  though she knows better.  She has had a college education.  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  She has 3 children.   REVIEW OF SYSTEMS:  Reveals some sleeping problems, severe fatigue,  shortness of breath, and some muscle cramps.   FAMILY HISTORY:  For GI disease is really noncontributory.   PHYSICAL EXAMINATION:  She is 5 feet 4 inches, weighs 147, blood pressure  108/68, pulse is 78 and regular.  Her neck, heart, extremities are all unremarkable except for the abdomen  being slightly tender on palpation overall.  There were no bruits or rubs.  EXTREMITIES:  Unremarkable.  RECTAL:  Deferred.   IMPRESSION:  1. Recurrent nausea and chest discomfort after eating as well as abdominal      pain of questionable etiology, rule out alkaline gastritis secondary to      her cholecystectomy, gastroesophageal reflux disease.  2. Diarrhea post colonoscopy, rule out irritable bowel syndrome,      infectious colitis, inflammatory bowel disease.  3. Aortic valve replacement.  4.  Status post hysterectomy, tubal ligation.  5. History of multiple allergies - CIPRO.  6. Arthritis.  7. Asthma in a patient who is continuing to smoke.  8. Probable anxiety-depression.  9. Probable gastroesophageal reflux disease.  10.History of possible rectal prolapse.   RECOMMENDATION:  I am going to try her on some Xifaxan for the diarrhea,  since she is ALLERGIC TO CIPRO, and I thought this might be more helpful.  I  am going to use 200 mg, just to take 1 twice daily.  We will give her some  NuLev to take 1 to 2 before meals, give her some AnaMantle cream for her  hemorrhoids, switch her from Nexium, which causes diarrhea at times, to  Zegerid, although she said her diarrhea did not start until right after, and  she had been on Nexium for quite some time before the diarrhea.  I am also  going to put her on some fluoroq daily, schedule her for a upper endoscopy  and a flexible sigmoid, and I would like desperately to get more information  from Rowan Blase, and her previous gastroenterologist who did the  colonoscopic examination on her.  There had to be some lower GI  symptomatology that would precipitate a colonoscopy on a 50 year old  initially, therefore I would like to get his notes to have a better  understanding of his reasoning for these procedures, and his findings and  thoughts after the procedures.  I will follow her back after the above  studies are done and the treatment is instituted.  I also will obtain some  stools for O&P and culture on her, and ColoScreens, to check her stools for  blood as well.     Ulyess Mort, MD  Electronically Signed    SML/MedQ  DD: 01/17/2006  DT: 01/18/2006  Job #: [id number]   cc:   Jesse Sans. Wall, MD, [facility]

## 2010-07-16 NOTE — Op Note (Signed)
NAMECHARLA, CRISCIONE                ACCOUNT NO.:  [id number]   MEDICAL RECORD NO.:  [id number]          PATIENT TYPE:  AMB   LOCATION:  DAY                           FACILITY:  [facility]   PHYSICIAN:  Kassie Mends, M.D.      DATE OF BIRTH:  November 26, 1960   DATE OF PROCEDURE:  09/19/2005  DATE OF DISCHARGE:                                 OPERATIVE REPORT   REFERRING PHYSICIAN:  Dr. Ralph Dowdy.   PROCEDURE:  Sigmoidoscopy.   INDICATION FOR EXAM:  Mrs. [name] is a 50 year old female, who presents with  constipation and intermittent rectal bleeding since 1998.  She has no family  history of colon cancer or  colon polyps.   MEDICATIONS:  1.  Demerol 50 mg IV.  2.  Versed 4 mg IV.   FINDINGS:  1.  Normal sigmoidoscopy.  No diverticula, polyps, masses, inflammatory      changes or gastric vascular ectasia seen.  2.  Normal retroflex view of the rectum, no source for intermittent rectal      bleeding identified.   RECOMMENDATIONS:  1.  Continue high-fiber diet.  2.  Mrs. Odell has a follow-up appointment with me to reassess the nature of      her rectal bleeding and determine whether a colonoscopy is warranted.   PROCEDURE TECHNIQUE:  A physical exam was performed and informed consent was  obtained from the patient after explaining all risks, benefits and  alternatives to the procedure, which the patient appeared to understand and  so stated.  The patient was connected to the monitoring device and placed in  the left lateral position.  Continuous suction was provided by nasal cannula  and IV medicine administered through an indwelling cannula.  After  administration of sedation and rectal exam, the patient's rectum was  intubated.  The scope was advanced under direct visualization to the  proximal descending colon.  The scope was subsequently removed slowly by  carefully examining the color, texture, anatomy and integrity of the mucosa  on the way out.  The patient was recovered in the endoscopy  suite  and  discharged home in satisfactory condition.     Kassie Mends, M.D.  Electronically Signed    SM/MEDQ  D:  09/20/2005  T:  09/21/2005  Job:  [id number]

## 2010-07-16 NOTE — Assessment & Plan Note (Signed)
[facility]                            CARDIOLOGY OFFICE NOTE   Lindsay Bonilla, Lindsay Bonilla                       MRN:          [id number]  DATE:02/07/2006                            DOB:          06/27/1960    Lindsay Bonilla returns today for further management of problems listed on  January 12, 2006.  We started her on low dose beta blocker which has  helped significantly with her palpitations.  We told her stay off of her  Crestor because of generalized fatigue and weakness.  This seems to have  improved.   I also sent her to Dr. Victorino Dike for a second opinion with all her  diarrhea and other issues.  It is well outlined by him on his note  January 24, 2006.  Please refer to that.   She is scheduled for endoscopy and sigmoid on the 20th of December.   She has lost a significant amount of weight, down from 146 to 121.   VITAL SIGNS:  Her blood pressure today is 123/78.  Pulse is 58, and she  is in sinus brady.  EKG is stable.  Her weight is 121.  NECK:  Her carotid upstrokes are equal bilaterally without bruits. There  is no JVD.  Thyroid is not enlarged.  HEART:  She has a soft systolic murmur with a split S2 along the left  sternal border.  LUNGS:  Remarkable for inspiratory/expiratory rhonchi.  ABDOMEN:  Soft.  EXTREMITIES:  Reveal no edema.  Pulses are present.   I am delighted that Lindsay Bonilla is better from the tachy palpitations  and the fatigue.  For now we will continue Toprol XL 25 mg a day, and  keep her off of her Crestor.  I plan on seeing her back in 6 months.  She will follow up with GI.     Thomas C. Daleen Squibb, MD, [facility]  Electronically Signed    TCW/MedQ  DD: 02/07/2006  DT: 02/07/2006  Job #: [id number]   cc:   Ramon Dredge L. Juanetta Gosling, M.D.

## 2010-07-16 NOTE — Assessment & Plan Note (Signed)
The patient is a 50 year old female who I know from previous  evaluations.  I last saw her January 26, 2006, at which time I did an  L2-3 translaminar epidural steroid injection with fluoroscopic guidance.  This did not result in significant pain relief.  Her prior injection,  which was done at L4-5 level, resulted in reduction in pain from a 6 to  a 1 that lasted at least 2 weeks.  She, in the interval time has  followed up with Dr. Juanetta Gosling and had been referred to Dr. Alveda Reasons, spine  surgeon here in [place].  Dr. Alveda Reasons did a new x-ray of her lumbar  spine showing some traction spurs at L2-3, very mild thoracal scoliosis  at T9 and L4.  He did not feel any surgical intervention was necessary  or likely to be of benefit.  She has had MRI of the lumbar spine in  2007, which had been previously reviewed.  The patient complains of a  catch in her low back down near her sacral area, per her report.  She  does not have any lower extremity weakness or significant radiating  pain.  She has some constant pain in the mid-back area.  Her current  pain is rated as 7/10 but can get up to a 10/10 at times when it  catches.  She is not employed and on disability since 1999.  Problems  with tachycardia, COPD, asthma.  She has a history of aortic valve  replacement but is not on Coumadin.   REVIEW OF SYSTEMS:  Positive for anxiety.  Negative for depression.  She  is on Zoloft 50 mg a day started last fall but no escalation in dose  recently.   SOCIAL HISTORY:  Married, lives with her son and step-daughter.  Smokes.  She had been caring for her elderly mother and has a son with behavioral  problems.   EXAMINATION:  VITAL SIGNS:  Blood pressure 114/73, pulse 80,  respirations 16, O2 sat 97% on room air.  GENERAL:  No acute distress.  Mood and affect art moderately labile.  Gait is normal.  Strength is normal in upper and lower extremities.  Range of motion of neck is normal.  Back range of motion is  extremely  limited, very hesitant and scared to move in any direction.  She has no  significant tenderness to palpation in the lumbar spine.  She has normal  deep tendon reflexes and sensation in lower extremities.  Faber's  testing shows no significant pain with this maneuver, negative straight-  leg raising.   IMPRESSION:  The patient with lumbar axial pain, not clearly related to  facet problems given that she has had not significant relief with medial  branch blocks.  Therefore, I do not think radiofrequency neurotomy would  be indicated.  She does not have significantly degenerated disks on her  MRI, which leaves the question of whether or not her pain may be from  the sacroiliac joint.   PLAN:  We will have her come back for sacroiliac joint injection under  fluoroscopic guidance and failing this, would likely lead to make some  changes in her medication management.  In addition, I think she would  benefit from  the TENS unit and have sent her to physical therapy on 2-3 visits to  accomplish this.  I will see her back for the injections.      Erick Colace, M.D.  Electronically Signed     AEK/MedQ  D:  05/30/2006 14:34:30  T:  05/30/2006 15:21:04  Job #:  [id number]   cc:   Ramon Dredge L. Juanetta Gosling, M.D.  Fax: [phone]   Dr. Meredith/[facility]  [place], [place]

## 2010-07-16 NOTE — Assessment & Plan Note (Signed)
[facility]                              CARDIOLOGY OFFICE NOTE   Lindsay Bonilla, Lindsay Bonilla                       MRN:          [id number]  DATE:12/20/2005                            DOB:          1960-05-29    HISTORY OF PRESENT ILLNESS:  This is a 50 year old married white female,  patient of Dr. Juanito Doom, who has a history of severe aortic stenosis with a  bicuspid aortic valve, who underwent a Ross procedure at [facility] in  1998.  She had a cath at that time that was within normal limits.  She was  last evaluated here a year ago, at which time she had an adenosine Myoview  that was negative for ischemia, and a 2D echo that showed normal LV  function, ejection fraction 50%-55%, aortic homograph is well seated with  normal gradients and mild regurgitation.  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   The patient comes in today with complaints of palpitations.  She says she  has had these off and on over the years, but in the past 2 weeks it has  progressively worsened.  She says it is worse at night when she lays down,  but she had an episode that kept her awake until 4 a.m. and she claims that  her pulse was 160 beats per minute and almost went to the emergency room.  This was her worst episode.  She has occasional palpitations during the day,  but it is not as noticeable as at night when she is lying down.  During the  day, if she has them, it may last a few minutes, subside, and then come  back, but it has never been prolonged like this.  She denies any excessive  caffeine intake.  She drinks about a half a cup of coffee a day.  No sodas  or tea.  She has recently been seen by GI for a lot of gnawing epigastric  pain and diarrhea, and has had some weight loss with this.  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  When she has the palpitations, she does become diaphoretic  and short of breath.  She has not had any syncope.  She has occasional  sharp, shooting chest pains not associated with the palpitations, but they  are short-lived and stabbing in nature.   CURRENT MEDICATIONS:  1. Benadryl 50 mg q.i.d.  2. Allergy shots twice a week.  3. Albuterol inhaler.  4. Crestor 10 mg nightly.  5. Lasix 40 mg daily.   PHYSICAL EXAM:  This is a pleasant 50 year old white female in no acute  distress.  Blood pressure 108/70, pulse 76, weight 151.  NECK:  Without JVD, HR, bruit, or thyroid enlargement.  LUNGS:  Decreased breath sounds, but they are clear anterior, posterior, and  lateral.  HEART:  Regular rate and rhythm at 76  beats per minute.  Normal S1, S2 with  a 2/6 systolic murmur at the right sternal border and left sternal border.  I cannot appreciate a diastolic murmur or gallop.  ABDOMEN:  Soft without organomegaly, masses, lesions, or abnormal  tenderness.  EXTREMITIES:  Without cyanosis, clubbing, or edema.  She has good distal  pulses.   EKG:  Normal sinus rhythm with right atrial enlargement, rightward axis,  poor R wave progression, no acute change.   IMPRESSION:  1. Palpitations with heart rate up to 160, per patient, lasting several      hours.  2. 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  Last echo in September 2006 with mild AI.  3. Tobacco abuse, ongoing.  4. Hypercholesterolemia, treated.  5. Chronic back pain.  6. History of asthma.   PLAN:  At this time, we will check a BMET, TSH, fasting lipid panel, and  LFTs today.  Will also place an event recorder on her to document any  arrhythmias.  She has been on Cardizem in the past for palpitations and this  has not helped at all.  She will then followup with Dr. Daleen Squibb in 1 month.  She is  told to come to the emergency room if she has prolonged tachycardia.     ______________________________  Jacolyn Reedy, PA-C    ______________________________  Bevelyn Buckles. Bensimhon, MD   ML/MedQ  DD: 12/20/2005  DT: 12/20/2005  Job #: [id number]

## 2010-09-16 ENCOUNTER — Encounter: Payer: Self-pay | Admitting: Cardiology

## 2010-09-16 ENCOUNTER — Other Ambulatory Visit (HOSPITAL_COMMUNITY): Payer: Self-pay | Admitting: Cardiology

## 2010-09-16 DIAGNOSIS — Z952 Presence of prosthetic heart valve: Secondary | ICD-10-CM

## 2010-09-20 ENCOUNTER — Encounter: Payer: Self-pay | Admitting: Cardiology

## 2010-09-20 ENCOUNTER — Ambulatory Visit (HOSPITAL_COMMUNITY): Payer: Medicaid Other | Attending: Cardiology | Admitting: Radiology

## 2010-09-20 ENCOUNTER — Ambulatory Visit (INDEPENDENT_AMBULATORY_CARE_PROVIDER_SITE_OTHER): Payer: Medicaid Other | Admitting: Cardiology

## 2010-09-20 VITALS — BP 118/60 | HR 79 | Ht 64.0 in | Wt 159.0 lb

## 2010-09-20 DIAGNOSIS — F17200 Nicotine dependence, unspecified, uncomplicated: Secondary | ICD-10-CM

## 2010-09-20 DIAGNOSIS — R002 Palpitations: Secondary | ICD-10-CM

## 2010-09-20 DIAGNOSIS — I38 Endocarditis, valve unspecified: Secondary | ICD-10-CM

## 2010-09-20 DIAGNOSIS — I080 Rheumatic disorders of both mitral and aortic valves: Secondary | ICD-10-CM | POA: Insufficient documentation

## 2010-09-20 DIAGNOSIS — R079 Chest pain, unspecified: Secondary | ICD-10-CM

## 2010-09-20 DIAGNOSIS — I359 Nonrheumatic aortic valve disorder, unspecified: Secondary | ICD-10-CM

## 2010-09-20 DIAGNOSIS — Z952 Presence of prosthetic heart valve: Secondary | ICD-10-CM

## 2010-09-20 NOTE — Progress Notes (Signed)
HPI Ms Lindsay Bonilla returns today for evaluation management history of bicuspid aortic valve, severe aortic stenosis, status post homograft aortic valve replacement, history of palpitations, history of aortic root dilatation in stable, tobacco use, and history of atypical chest pain. He  Has had a negative stress nuclear study in the past.  She's cut way back on smoking. Her dyspnea is improved and she's had no chest pain or palpitations. She had a 2-D echocardiogram today, results pending.  EKG today shows normal sinus rhythm, normal EKG other than some left atrium enlargement and a slight rightward axis, stable. Past Medical History  Diagnosis Date  . Palpitations   . 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 Aortic stenosis, severe     Bicuspid valve  . Valvular heart disease   . Migraine headache   . Bronchitis   . COPD (chronic obstructive pulmonary disease)   . CHF (congestive heart failure)   . Asthma     Past Surgical History  Procedure Date  . Sigmoidoscopy 10/13/05, 09/19/05  . Colonoscopy 10/13/05  . Aortic valve replacement   . Cholecystectomy   . Abdominal hysterectomy   . Knee arthroscopy   . Tonsillectomy   . Tubal ligation     Family History  Problem Relation Age of Onset  . Cancer Mother     Type unknown  . Heart attack Father   . Diabetes      family history  . Asthma Brother   . Hyperlipidemia      family history    History   Social History  . Marital Status: Legally Separated    Spouse Name: N/A    Number of Children: N/A  . Years of Education: N/A   Occupational History  . Not on file.   Social History Main Topics  . Smoking status: Current Everyday Smoker  . Smokeless tobacco: Not on file  . Alcohol Use: Yes  . Drug Use: No  . Sexually Active:    Other Topics Concern  . Not on file   Social History Narrative  . No narrative on file    Allergies  Allergen Reactions  . Beta Adrenergic Blockers   . Ciprofloxacin    . Moxifloxacin   . Peanut-Containing Drug Products   . Shrimp (Shellfish Allergy)   . Sulfamethoxazole W/Trimethoprim     Current Outpatient Prescriptions  Medication Sig Dispense Refill  . albuterol (VENTOLIN HFA) 108 (90 BASE) MCG/ACT inhaler Inhale into the lungs as directed.        Marland Kitchen ALPRAZolam (XANAX) 1 MG tablet Take by mouth as needed.        Marland Kitchen amoxicillin (AMOXIL) 500 MG capsule Take by mouth as directed. For dental appt.       . beclomethasone (QVAR) 40 MCG/ACT inhaler Inhale 2 puffs into the lungs daily.        . cetirizine (ZYRTEC) 10 MG tablet Take 10 mg by mouth daily.        . cyclobenzaprine (FLEXERIL) 10 MG tablet Take 20 mg by mouth at bedtime.        . DULoxetine (CYMBALTA) 60 MG capsule Take 60 mg by mouth daily.        Marland Kitchen EPINEPHrine (EPIPEN) 0.3 mg/0.3 mL DEVI Inject into the muscle as needed.        Marland Kitchen esomeprazole (NEXIUM) 40 MG capsule Take 40 mg by mouth 2 (two) times daily.        . furosemide (LASIX) 40  MG tablet Take 40 mg by mouth 2 (two) times daily.        . hydrOXYzine (ATARAX) 10 MG tablet Take 10 mg by mouth every 6 (six) hours as needed.        . meloxicam (MOBIC) 15 MG tablet Take 15 mg by mouth daily.        . montelukast (SINGULAIR) 10 MG tablet Take 10 mg by mouth daily.        Marland Kitchen omalizumab (XOLAIR) 150 MG injection Inject 150 mg into the skin. 2 times a month.       . oxyCODONE-acetaminophen (PERCOCET) 10-325 MG per tablet Take by mouth as needed.        Marland Kitchen oxymorphone (OPANA ER) 20 MG 12 hr tablet Take 20 mg by mouth 2 (two) times daily.        . potassium chloride SA (K-DUR,KLOR-CON) 20 MEQ tablet Take 20 mEq by mouth 2 (two) times daily.        . rosuvastatin (CRESTOR) 10 MG tablet Take 10 mg by mouth daily.        Marland Kitchen zolpidem (AMBIEN) 10 MG tablet Take 10 mg by mouth at bedtime.          ROS Negative other than HPI.   PE General Appearance: well developed, well nourished in no acute distress HEENT: symmetrical face, PERRLA, good dentition    Neck: no JVD, thyromegaly, or adenopathy, trachea midline Chest: symmetric without deformity Cardiac: PMI non-displaced, RRR, normal S1, S2, 2/6 systolic murmur along the right upper sternal border, 2/6 diastolic murmur consistent with aortic insufficiency. Lung: clear to ausculation and percussion Vascular: all pulses full without bruits  Abdominal: nondistended, nontender, good bowel sounds, no HSM, no bruits Extremities: no cyanosis, clubbing or edema, no sign of DVT, no varicosities  Skin: normal color, no rashes Neuro: alert and oriented x 3, non-focal Pysch: normal affect Filed Vitals:   09/20/10 1421  BP: 118/60  Pulse: 79  Height: 5\' 4"  (1.626 m)  Weight: 159 lb (72.122 kg)    EKG  Labs and Studies Reviewed.   Lab Results  Component Value Date   WBC 8.3 04/18/2008   HGB 14.2 04/18/2008   HCT 41.7 04/18/2008   MCV 90.1 04/18/2008   PLT 197 04/18/2008      Chemistry      Component Value Date/Time   NA 137 08/08/2007 0933   K 4.5 08/08/2007 0933   CL 101 08/08/2007 0933   CO2 30 08/08/2007 0933   BUN 12 08/08/2007 0933   CREATININE 0.9 08/08/2007 0933      Component Value Date/Time   CALCIUM 8.8 08/08/2007 0933   ALKPHOS 59 08/08/2007 0933   AST 19 08/08/2007 0933   ALT 15 08/08/2007 0933   BILITOT 0.7 08/08/2007 0933       Lab Results  Component Value Date   CHOL 154 08/08/2007   CHOL 239* 09/19/2006   Lab Results  Component Value Date   HDL 45.5 08/08/2007   HDL 40.9 09/19/2006   Lab Results  Component Value Date   LDLCALC 77 08/08/2007   Lab Results  Component Value Date   TRIG 158* 08/08/2007   TRIG 142 09/19/2006   Lab Results  Component Value Date   CHOLHDL 3.4 CALC 08/08/2007   CHOLHDL 5.8 CALC 09/19/2006   No results found for this basename: HGBA1C   Lab Results  Component Value Date   ALT 15 08/08/2007   AST 19 08/08/2007   ALKPHOS 59  08/08/2007   BILITOT 0.7 08/08/2007   No results found for this basename: TSH

## 2010-09-20 NOTE — Assessment & Plan Note (Signed)
Improved

## 2010-09-20 NOTE — Assessment & Plan Note (Signed)
Her aortic valve sounds stable on exam with mild regurgitation. 2-D echocardiogram was performed today and results are pending. We will review in color. We'll also look at her aortic root. Do not feel contrast study is necessary if this looks stable.

## 2010-09-20 NOTE — Patient Instructions (Addendum)
Your physician recommends that you schedule a follow-up appointment in: 6 months with Dr. Daleen Squibb  Your physician discussed the hazards of tobacco use. Tobacco use cessation is recommended and techniques and options to help you quit were discussed. Congratulations on reducing your smoking!! Continue your hard work !!

## 2010-09-20 NOTE — Assessment & Plan Note (Signed)
She has cut way back. Positive reinforcement given and encouraged to quit.

## 2010-09-22 ENCOUNTER — Telehealth: Payer: Self-pay | Admitting: *Deleted

## 2010-09-22 NOTE — Telephone Encounter (Signed)
lmtcb Debbie Leovanni Bjorkman RN  

## 2010-09-22 NOTE — Telephone Encounter (Signed)
Message copied by Theda Belfast on Wed Sep 22, 2010 12:46 PM ------      Message from: Valera Castle C      Created: Wed Sep 22, 2010 10:40 AM       Stable. Reassurance

## 2010-09-22 NOTE — Telephone Encounter (Signed)
Pt is aware of test results. Reassurance given. Mylo Red RN

## 2010-11-27 ENCOUNTER — Other Ambulatory Visit: Payer: Self-pay | Admitting: Cardiology

## 2011-01-28 ENCOUNTER — Other Ambulatory Visit: Payer: Self-pay | Admitting: Cardiology

## 2011-01-28 NOTE — Telephone Encounter (Signed)
..   Requested Prescriptions   Pending Prescriptions Disp Refills  . CRESTOR 10 MG tablet [Pharmacy Med Name: CRESTOR 10MG  TABLET] 30 each 6    Sig: TAKE (1) TABLET BY MOUTH ONCE DAILY.

## 2011-03-15 ENCOUNTER — Ambulatory Visit (INDEPENDENT_AMBULATORY_CARE_PROVIDER_SITE_OTHER): Payer: Medicaid Other | Admitting: Cardiology

## 2011-03-15 ENCOUNTER — Encounter: Payer: Self-pay | Admitting: Cardiology

## 2011-03-15 VITALS — BP 112/82 | HR 84 | Ht 64.0 in | Wt 156.0 lb

## 2011-03-15 DIAGNOSIS — I712 Thoracic aortic aneurysm, without rupture, unspecified: Secondary | ICD-10-CM

## 2011-03-15 DIAGNOSIS — R0609 Other forms of dyspnea: Secondary | ICD-10-CM

## 2011-03-15 DIAGNOSIS — I38 Endocarditis, valve unspecified: Secondary | ICD-10-CM

## 2011-03-15 DIAGNOSIS — F17200 Nicotine dependence, unspecified, uncomplicated: Secondary | ICD-10-CM

## 2011-03-15 DIAGNOSIS — J449 Chronic obstructive pulmonary disease, unspecified: Secondary | ICD-10-CM

## 2011-03-15 NOTE — Progress Notes (Signed)
HPI [name] returns for evaluation and management of her history of severe bicuspid aortic stenosis, status post pulmonary homograft replacement of the aortic valve, palpitations, history of subclinical CAD, COPD, ongoing tobacco use, and mild aortic root dilatation.  She has cut back on smoking. Her dyspnea stable. She denies any angina or chest pain. Her edema has improved.  Her biggest complaint is distress at time with her son and also going through menopause.  Echocardiogram in July 2012 was stable. She has mild aortic insufficiency with normal left ventricular chamber size and function. She is mild aortic root dilatation at 44 mm.  Past Medical History  Diagnosis Date  . Palpitations   . 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 Aortic stenosis, severe     Bicuspid valve  . Valvular heart disease   . Migraine headache   . Bronchitis   . COPD (chronic obstructive pulmonary disease)   . CHF (congestive heart failure)   . Asthma     Current Outpatient Prescriptions  Medication Sig Dispense Refill  . albuterol (VENTOLIN HFA) 108 (90 BASE) MCG/ACT inhaler Inhale into the lungs as directed.        Marland Kitchen ALPRAZolam (XANAX) 1 MG tablet Take by mouth as needed.        Marland Kitchen amoxicillin (AMOXIL) 500 MG capsule Take by mouth as directed. For dental appt.       Marland Kitchen azelastine (OPTIVAR) 0.05 % ophthalmic solution Place 1 drop into both eyes 2 (two) times daily.      . benzonatate (TESSALON) 100 MG capsule Take 200 mg by mouth 3 (three) times daily as needed.      . cetirizine (ZYRTEC) 10 MG tablet Take 10 mg by mouth daily.        . CRESTOR 10 MG tablet TAKE (1) TABLET BY MOUTH ONCE DAILY.  30 each  6  . cyclobenzaprine (FLEXERIL) 10 MG tablet Take 20 mg by mouth at bedtime.        Marland Kitchen EPINEPHrine (EPIPEN) 0.3 mg/0.3 mL DEVI Inject into the muscle as needed.        Marland Kitchen esomeprazole (NEXIUM) 40 MG capsule Take 40 mg by mouth 2 (two) times daily.        . hydrOXYzine (ATARAX)  10 MG tablet Take 10 mg by mouth every 6 (six) hours as needed.        Marland Kitchen LASIX 40 MG tablet TAKE (1) TABLET TWICE DAILY.  60 each  12  . lubiprostone (AMITIZA) 24 MCG capsule Take 24 mcg by mouth 2 (two) times daily with a meal.      . meloxicam (MOBIC) 15 MG tablet Take 15 mg by mouth daily.        . montelukast (SINGULAIR) 10 MG tablet Take 10 mg by mouth daily.        Marland Kitchen oxyCODONE-acetaminophen (PERCOCET) 10-325 MG per tablet Take by mouth as needed.        Marland Kitchen oxymorphone (OPANA ER) 20 MG 12 hr tablet Take 20 mg by mouth 2 (two) times daily.        . potassium chloride SA (K-DUR,KLOR-CON) 20 MEQ tablet Take 20 mEq by mouth 2 (two) times daily.        . sertraline (ZOLOFT) 100 MG tablet Take 100 mg by mouth daily.      . SUMAtriptan (IMITREX) 100 MG tablet Take 100 mg by mouth as needed.        Allergies  Allergen Reactions  .  Avelox (Moxifloxacin Hcl In Nacl)   . Beta Adrenergic Blockers   . Ciprofloxacin   . Moxifloxacin   . Peanut-Containing Drug Products   . Shrimp (Shellfish Allergy)   . Sulfamethoxazole W/Trimethoprim     Family History  Problem Relation Age of Onset  . Cancer Mother     Type unknown  . Heart attack Father   . Diabetes      family history  . Asthma Brother   . Hyperlipidemia      family history    History   Social History  . Marital Status: Legally Separated    Spouse Name: N/A    Number of Children: N/A  . Years of Education: N/A   Occupational History  . Not on file.   Social History Main Topics  . Smoking status: Current Everyday Smoker  . Smokeless tobacco: Not on file  . Alcohol Use: Yes  . Drug Use: No  . Sexually Active:    Other Topics Concern  . Not on file   Social History Narrative  . No narrative on file    ROS ALL NEGATIVE EXCEPT THOSE NOTED IN HPI  PE  General Appearance: well developed, well nourished in no acute distress HEENT: symmetrical face, PERRLA, good dentition  Neck: no JVD, thyromegaly, or adenopathy,  trachea midline Chest: symmetric without deformity Cardiac: PMI non-displaced, RRR, normal S1, S2, 2/6 systolic murmur, soft diastolic murmur left upper sternal border and right upper sternal border Lung: clear to ausculation and percussion Vascular: all pulses full without bruits  Abdominal: nondistended, nontender, good bowel sounds, no HSM, no bruits Extremities: no cyanosis, clubbing or edema, no sign of DVT, no varicosities  Skin: normal color, no rashes Neuro: alert and oriented x 3, non-focal Pysch: normal affect  EKG  Not repeated BMET    Component Value Date/Time   NA 137 08/08/2007 0933   K 4.5 08/08/2007 0933   CL 101 08/08/2007 0933   CO2 30 08/08/2007 0933   GLUCOSE 78 08/08/2007 0933   BUN 12 08/08/2007 0933   CREATININE 0.9 08/08/2007 0933   CALCIUM 8.8 08/08/2007 0933   GFRNONAA 72 08/08/2007 0933   GFRAA 87 08/08/2007 0933    Lipid Panel     Component Value Date/Time   CHOL 154 08/08/2007 0933   TRIG 158* 08/08/2007 0933   HDL 45.5 08/08/2007 0933   CHOLHDL 3.4 CALC 08/08/2007 0933   VLDL 32 08/08/2007 0933   LDLCALC 77 08/08/2007 0933    CBC    Component Value Date/Time   WBC 8.3 04/18/2008 2054   RBC 4.63 04/18/2008 2054   HGB 14.2 04/18/2008 2054   HCT 41.7 04/18/2008 2054   PLT 197 04/18/2008 2054   MCV 90.1 04/18/2008 2054   MCHC 34.1 04/18/2008 2054   RDW 12.8 04/18/2008 2054   LYMPHSABS 3.3 04/18/2008 2054   MONOABS 0.6 04/18/2008 2054   EOSABS 0.1 04/18/2008 2054   BASOSABS 0.1 04/18/2008 2054

## 2011-03-15 NOTE — Patient Instructions (Signed)
Your physician wants you to follow-up in: 6 months (July 2013). You will receive a reminder letter in the mail two months in advance. If you don't receive a letter, please call our office to schedule the follow-up appointment.  Your physician has requested that you have an echocardiogram just prior to July 2013 follow-up appt.. Echocardiography is a painless test that uses sound waves to create images of your heart. It provides your doctor with information about the size and shape of your heart and how well your heart's chambers and valves are working. This procedure takes approximately one hour. There are no restrictions for this procedure.

## 2011-03-17 ENCOUNTER — Other Ambulatory Visit: Payer: Self-pay | Admitting: *Deleted

## 2011-03-17 ENCOUNTER — Encounter: Payer: Self-pay | Admitting: *Deleted

## 2011-03-17 DIAGNOSIS — E782 Mixed hyperlipidemia: Secondary | ICD-10-CM

## 2011-03-17 MED ORDER — ATORVASTATIN CALCIUM 20 MG PO TABS: 20.0000 mg | ORAL_TABLET | Freq: Every day | ORAL | Status: DC

## 2011-03-21 ENCOUNTER — Other Ambulatory Visit: Payer: Self-pay | Admitting: Cardiology

## 2011-06-01 ENCOUNTER — Other Ambulatory Visit: Payer: Self-pay | Admitting: Cardiology

## 2011-06-02 LAB — LIPID PANEL
LDL Cholesterol: 116 mg/dL — ABNORMAL HIGH (ref 0–99)
VLDL: 26 mg/dL (ref 0–40)

## 2011-06-09 ENCOUNTER — Telehealth: Payer: Self-pay | Admitting: *Deleted

## 2011-06-09 MED ORDER — ROSUVASTATIN CALCIUM 10 MG PO TABS: 10.0000 mg | ORAL_TABLET | Freq: Every day | ORAL | Status: DC

## 2011-06-09 NOTE — Telephone Encounter (Signed)
Message copied by Barrie Folk on Thu Jun 09, 2011  4:23 PM ------      Message from: Valera Castle C      Created: Mon Jun 06, 2011  7:59 AM       Is she taking her cholesterol medicine.

## 2011-06-09 NOTE — Telephone Encounter (Signed)
I talked with pharmacist at [facility] regarding prior authorization on Crestor 10mg  for pt. He has run it and it went through. They will call pt and she will restart her Crestor. Pt was very concerned that Lipitor did not work as well as her Crestor. Mylo Red RN

## 2011-07-06 ENCOUNTER — Other Ambulatory Visit (HOSPITAL_COMMUNITY): Payer: Self-pay | Admitting: Pulmonary Disease

## 2011-07-06 DIAGNOSIS — R200 Anesthesia of skin: Secondary | ICD-10-CM

## 2011-07-08 ENCOUNTER — Ambulatory Visit (HOSPITAL_COMMUNITY)
Admission: RE | Admit: 2011-07-08 | Discharge: 2011-07-08 | Disposition: A | Discharge status: Home / Self Care | Payer: Medicaid Other | Source: Ambulatory Visit | Attending: Pulmonary Disease | Admitting: Pulmonary Disease

## 2011-07-08 DIAGNOSIS — M5020 Other cervical disc displacement, unspecified cervical region: Secondary | ICD-10-CM | POA: Insufficient documentation

## 2011-07-08 DIAGNOSIS — R200 Anesthesia of skin: Secondary | ICD-10-CM

## 2011-07-08 DIAGNOSIS — M47812 Spondylosis without myelopathy or radiculopathy, cervical region: Secondary | ICD-10-CM | POA: Insufficient documentation

## 2011-07-08 DIAGNOSIS — M542 Cervicalgia: Secondary | ICD-10-CM | POA: Insufficient documentation

## 2011-07-08 DIAGNOSIS — R209 Unspecified disturbances of skin sensation: Secondary | ICD-10-CM | POA: Insufficient documentation

## 2011-07-09 ENCOUNTER — Emergency Department (HOSPITAL_COMMUNITY)
Admission: EM | Admit: 2011-07-09 | Discharge: 2011-07-09 | Disposition: A | Discharge status: Home / Self Care | Payer: Medicaid Other | Attending: Emergency Medicine | Admitting: Emergency Medicine

## 2011-07-09 ENCOUNTER — Encounter (HOSPITAL_COMMUNITY): Payer: Self-pay | Admitting: *Deleted

## 2011-07-09 DIAGNOSIS — S30860A Insect bite (nonvenomous) of lower back and pelvis, initial encounter: Secondary | ICD-10-CM | POA: Insufficient documentation

## 2011-07-09 DIAGNOSIS — W57XXXA Bitten or stung by nonvenomous insect and other nonvenomous arthropods, initial encounter: Secondary | ICD-10-CM | POA: Insufficient documentation

## 2011-07-09 DIAGNOSIS — J449 Chronic obstructive pulmonary disease, unspecified: Secondary | ICD-10-CM | POA: Insufficient documentation

## 2011-07-09 DIAGNOSIS — J4489 Other specified chronic obstructive pulmonary disease: Secondary | ICD-10-CM | POA: Insufficient documentation

## 2011-07-09 DIAGNOSIS — F17200 Nicotine dependence, unspecified, uncomplicated: Secondary | ICD-10-CM | POA: Insufficient documentation

## 2011-07-09 MED ORDER — DOXYCYCLINE HYCLATE 100 MG PO CAPS: 100.0000 mg | ORAL_CAPSULE | Freq: Two times a day (BID) | ORAL | Status: AC

## 2011-07-09 MED ORDER — DOXYCYCLINE HYCLATE 100 MG PO TABS
100.0000 mg | ORAL_TABLET | Freq: Once | ORAL | Status: AC
  Administered 2011-07-09: 100 mg via ORAL
  Filled 2011-07-09: qty 1

## 2011-07-09 MED ORDER — DOXYCYCLINE HYCLATE 100 MG PO TABS
ORAL_TABLET | ORAL | Status: AC
  Administered 2011-07-09: 200 mg
  Filled 2011-07-09: qty 2

## 2011-07-09 NOTE — ED Provider Notes (Signed)
History     CSN: [id number]  Arrival date & time 07/09/11  1527   First MD Initiated Contact with Patient 07/09/11 1615      Chief Complaint  Patient presents with  . Insect Bite    (Consider location/radiation/quality/duration/timing/severity/associated sxs/prior treatment) HPI Comments: Pt pulled engorged tick off her back today.  Concerned that "head is still in there".  Currently on ceftin for "bronchitis".  Has h/o COPD.  Feels nauseous from ceftin.  "i would rather be treated for possible tick diseases".  The history is provided by the patient. No language interpreter was used.    Past Medical History  Diagnosis Date  . Palpitations   . 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 Aortic stenosis, severe     Bicuspid valve  . Valvular heart disease   . Migraine headache   . Bronchitis   . COPD (chronic obstructive pulmonary disease)   . CHF (congestive heart failure)   . Asthma     Past Surgical History  Procedure Date  . Sigmoidoscopy 10/13/05, 09/19/05  . Colonoscopy 10/13/05  . Aortic valve replacement   . Cholecystectomy   . Abdominal hysterectomy   . Knee arthroscopy   . Tonsillectomy   . Tubal ligation     Family History  Problem Relation Age of Onset  . Cancer Mother     Type unknown  . Heart attack Father   . Diabetes      family history  . Asthma Brother   . Hyperlipidemia      family history    History  Substance Use Topics  . Smoking status: Current Everyday Smoker  . Smokeless tobacco: Not on file  . Alcohol Use: Yes    OB History    Grav Para Term Preterm Abortions TAB SAB Ect Mult Living                  Review of Systems  Constitutional: Negative for fever and chills.  Skin: Positive for wound.  All other systems reviewed and are negative.    Allergies  Avelox; Beta adrenergic blockers; Ciprofloxacin; Moxifloxacin; Peanut-containing drug products; Shrimp; and Sulfamethoxazole w-trimethoprim  Home  Medications   Current Outpatient Rx  Name Route Sig Dispense Refill  . ALBUTEROL SULFATE HFA 108 (90 BASE) MCG/ACT IN AERS Inhalation Inhale into the lungs as directed.      Marland Kitchen ALPRAZOLAM 1 MG PO TABS Oral Take by mouth as needed.      . AMOXICILLIN 500 MG PO CAPS Oral Take by mouth as directed. For dental appt.     . AZELASTINE HCL 0.05 % OP SOLN Both Eyes Place 1 drop into both eyes 2 (two) times daily.    Marland Kitchen BENZONATATE 100 MG PO CAPS Oral Take 200 mg by mouth 3 (three) times daily as needed.    Marland Kitchen CETIRIZINE HCL 10 MG PO TABS Oral Take 10 mg by mouth daily.      . CYCLOBENZAPRINE HCL 10 MG PO TABS Oral Take 20 mg by mouth at bedtime.      Marland Kitchen DOXYCYCLINE HYCLATE 100 MG PO CAPS Oral Take 1 capsule (100 mg total) by mouth 2 (two) times daily. 20 capsule 0  . EPINEPHRINE 0.3 MG/0.3ML IJ DEVI Intramuscular Inject into the muscle as needed.      Marland Kitchen ESOMEPRAZOLE MAGNESIUM 40 MG PO CPDR Oral Take 40 mg by mouth 2 (two) times daily.      Marland Kitchen HYDROXYZINE HCL 10  MG PO TABS Oral Take 10 mg by mouth every 6 (six) hours as needed.      Marland Kitchen K-DUR 20 MEQ PO TBCR  TAKE (1) TABLET TWICE DAILY. 60 each 5  . LASIX 40 MG PO TABS  TAKE (1) TABLET TWICE DAILY. 60 each 12  . LUBIPROSTONE 24 MCG PO CAPS Oral Take 24 mcg by mouth 2 (two) times daily with a meal.    . MELOXICAM 15 MG PO TABS Oral Take 15 mg by mouth daily.      Marland Kitchen MONTELUKAST SODIUM 10 MG PO TABS Oral Take 10 mg by mouth daily.      . OXYCODONE-ACETAMINOPHEN 10-325 MG PO TABS Oral Take by mouth as needed.      Marland Kitchen OXYMORPHONE HCL ER 20 MG PO TB12 Oral Take 20 mg by mouth 2 (two) times daily.      Marland Kitchen ROSUVASTATIN CALCIUM 10 MG PO TABS Oral Take 1 tablet (10 mg total) by mouth at bedtime. 30 tablet 6  . SERTRALINE HCL 100 MG PO TABS Oral Take 100 mg by mouth daily.    . SUMATRIPTAN SUCCINATE 100 MG PO TABS Oral Take 100 mg by mouth as needed.      BP 133/85  Pulse 78  Temp(Src) 98.1 F (36.7 C) (Oral)  Resp 18  Ht 5\' 5"  (1.651 m)  Wt 143 lb (64.864 kg)   BMI 23.80 kg/m2  SpO2 98%  Physical Exam  Nursing note and vitals reviewed. Constitutional: She is oriented to person, place, and time. She appears well-developed and well-nourished. No distress.  HENT:  Head: Normocephalic and atraumatic.  Eyes: EOM are normal.  Neck: Normal range of motion.  Cardiovascular: Normal rate, regular rhythm and normal heart sounds.   Pulmonary/Chest: Effort normal and breath sounds normal.  Abdominal: Soft. She exhibits no distension. There is no tenderness.  Musculoskeletal: Normal range of motion.       Back:  Neurological: She is alert and oriented to person, place, and time.  Skin: Skin is warm and dry.  Psychiatric: She has a normal mood and affect. Judgment normal.    ED Course  Procedures (including critical care time)  Labs Reviewed - No data to display Mr Cervical Spine Wo Contrast  07/08/2011  *RADIOLOGY REPORT*  Clinical Data: Chronic neck pain with bilateral hand numbness, worse on the right.  No known injury or prior relevant surgery.  MRI CERVICAL SPINE WITHOUT CONTRAST  Technique:  Multiplanar and multiecho pulse sequences of the cervical spine, to include the craniocervical junction and cervicothoracic junction, were obtained according to standard protocol without intravenous contrast.  Comparison: Cervical spine radiographs 07/16/2009.  Findings: Study is mildly motion degraded but sufficiently diagnostic.  The cervical alignment is normal.  There is no evidence of acute fracture or paraspinal abnormality.  The craniocervical junction appears normal.  The cervical cord is normal in signal and caliber.  There are bilateral vertebral artery flow voids.  There are no significant disc space findings at or above C4-C5.  C5-C6:  There is spondylosis with loss of disc height and posterior osteophytes covering diffusely bulging disc material.  There is no resulting cord deformity.  However, mild foraminal stenosis is present bilaterally.  There is mild  bilateral facet hypertrophy.  C6-C7:  There is spondylosis with loss of disc height and posterior osteophytes covering diffusely bulging disc material.  There is a small central disc protrusion.  There is no resulting cord deformity or significant foraminal stenosis.  C7-T1:  Normal  interspace.  Small perineural cysts are noted in the upper thoracic region.  IMPRESSION:  1.  Mild cervical spondylosis inferiorly as described.  There is mild biforaminal stenosis at C5-C6 and a small central disc protrusion at C6-C7. 2.  No large disc herniation or cord deformity. 3.  No acute osseous findings or malalignment.  Original Report Authenticated By: Gerrianne Scale, M.D.     1. Tick bite       MDM  Pt told to d/c ceftin and take   Doxycycline.    She is to follow up with dr. Juanetta Gosling prn.        Worthy Rancher, PA 07/09/11 1643  Worthy Rancher, PA 07/09/11 [id number]

## 2011-07-09 NOTE — ED Notes (Signed)
Tick bite to left side of back

## 2011-07-09 NOTE — Discharge Instructions (Signed)
Insect Bite Mosquitoes, flies, fleas, bedbugs, and many other insects can bite. Insect bites are different from insect stings. A sting is when venom is injected into the skin. Some insect bites can transmit infectious diseases. SYMPTOMS  Insect bites usually turn red, swell, and itch for 2 to 4 days. They often go away on their own. TREATMENT  Your caregiver may prescribe antibiotic medicines if a bacterial infection develops in the bite. HOME CARE INSTRUCTIONS  Do not scratch the bite area.   Keep the bite area clean and dry. Wash the bite area thoroughly with soap and water.   Put ice or cool compresses on the bite area.   Put ice in a plastic bag.   Place a towel between your skin and the bag.   Leave the ice on for 20 minutes, 4 times a day for the first 2 to 3 days, or as directed.   You may apply a baking soda paste, cortisone cream, or calamine lotion to the bite area as directed by your caregiver. This can help reduce itching and swelling.   Only take over-the-counter or prescription medicines as directed by your caregiver.   If you are given antibiotics, take them as directed. Finish them even if you start to feel better.  You may need a tetanus shot if:  You cannot remember when you had your last tetanus shot.   You have never had a tetanus shot.   The injury broke your skin.  If you get a tetanus shot, your arm may swell, get red, and feel warm to the touch. This is common and not a problem. If you need a tetanus shot and you choose not to have one, there is a rare chance of getting tetanus. Sickness from tetanus can be serious. SEEK IMMEDIATE MEDICAL CARE IF:   You have increased pain, redness, or swelling in the bite area.   You see a red line on the skin coming from the bite.   You have a fever.   You have joint pain.   You have a headache or neck pain.   You have unusual weakness.   You have a rash.   You have chest pain or shortness of breath.   You  have abdominal pain, nausea, or vomiting.   You feel unusually tired or sleepy.  MAKE SURE YOU:   Understand these instructions.   Will watch your condition.   Will get help right away if you are not doing well or get worse.  Document Released: 03/24/2004 Document Revised: 02/03/2011 Document Reviewed: 09/15/2010 [facility] Patient Information 2012 [place], [place].   Take the doxycycline as directed.  Follow up with dr. Juanetta Gosling as needed.

## 2011-07-09 NOTE — ED Notes (Signed)
Pt states pharmacy closed today and tomorrow. Per r miller pa can give 2 doxy to go.

## 2011-07-09 NOTE — ED Provider Notes (Signed)
Medical screening examination/treatment/procedure(s) were performed by non-physician practitioner and as supervising physician I was immediately available for consultation/collaboration.  Dolly Harbach, MD 07/09/11 2033 

## 2011-07-14 ENCOUNTER — Encounter (HOSPITAL_COMMUNITY): Payer: Self-pay | Admitting: *Deleted

## 2011-07-14 ENCOUNTER — Emergency Department (HOSPITAL_COMMUNITY)
Admission: EM | Admit: 2011-07-14 | Discharge: 2011-07-15 | Disposition: A | Discharge status: Home / Self Care | Payer: Medicaid Other | Attending: Emergency Medicine | Admitting: Emergency Medicine

## 2011-07-14 DIAGNOSIS — J4489 Other specified chronic obstructive pulmonary disease: Secondary | ICD-10-CM | POA: Insufficient documentation

## 2011-07-14 DIAGNOSIS — R5381 Other malaise: Secondary | ICD-10-CM | POA: Insufficient documentation

## 2011-07-14 DIAGNOSIS — IMO0001 Reserved for inherently not codable concepts without codable children: Secondary | ICD-10-CM | POA: Insufficient documentation

## 2011-07-14 DIAGNOSIS — I509 Heart failure, unspecified: Secondary | ICD-10-CM | POA: Insufficient documentation

## 2011-07-14 DIAGNOSIS — B349 Viral infection, unspecified: Secondary | ICD-10-CM

## 2011-07-14 DIAGNOSIS — R1310 Dysphagia, unspecified: Secondary | ICD-10-CM | POA: Insufficient documentation

## 2011-07-14 DIAGNOSIS — J449 Chronic obstructive pulmonary disease, unspecified: Secondary | ICD-10-CM | POA: Insufficient documentation

## 2011-07-14 DIAGNOSIS — B9789 Other viral agents as the cause of diseases classified elsewhere: Secondary | ICD-10-CM | POA: Insufficient documentation

## 2011-07-14 DIAGNOSIS — J040 Acute laryngitis: Secondary | ICD-10-CM | POA: Insufficient documentation

## 2011-07-14 DIAGNOSIS — R5383 Other fatigue: Secondary | ICD-10-CM | POA: Insufficient documentation

## 2011-07-14 NOTE — ED Provider Notes (Signed)
History   This chart was scribed for [employer]. Colon Branch, MD by Shari Heritage. The patient was seen in room APA15/APA15. Patient's care was started at 2158.     CSN: [id number]  Arrival date & time 07/14/11  2158   First MD Initiated Contact with Patient 07/14/11 2308      Chief Complaint  Patient presents with  . Generalized Body Aches    (Consider location/radiation/quality/duration/timing/severity/associated sxs/prior treatment) The history is provided by the patient. No language interpreter was used.   Lindsay Bonilla is a 51 y.o. female who presents to the Emergency Department complaining of persistent, mild malaise and laryngitis 7 days ago associated with nausea. Patient visited the ED on week ago after being bitten by a tick 10 days ago and is currently on antibiotics (both Ceftin and doxycycline). Patient fears she has lyme disease or [facility] fever. She and her daughter are asking that blood work be done.  Patient is also hoarse. Patient denies fever, chills, abdominal pain, chest pain, HA, visual disturbance and congestion. Patient with h/o COPD, aortic stenosis, CHF, asthma, aortic valve replacement, cholecystectomy and abdominal hysterectomy. Patient is a current every day smoker.  PCP - Juanetta Gosling  Past Medical History  Diagnosis Date  . Palpitations   . 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 Aortic stenosis, severe     Bicuspid valve  . Valvular heart disease   . Migraine headache   . Bronchitis   . COPD (chronic obstructive pulmonary disease)   . CHF (congestive heart failure)   . Asthma     Past Surgical History  Procedure Date  . Sigmoidoscopy 10/13/05, 09/19/05  . Colonoscopy 10/13/05  . Aortic valve replacement   . Cholecystectomy   . Abdominal hysterectomy   . Knee arthroscopy   . Tonsillectomy   . Tubal ligation     Family History  Problem Relation Age of Onset  . Cancer Mother     Type unknown  . Heart attack Father     . Diabetes      family history  . Asthma Brother   . Hyperlipidemia      family history    History  Substance Use Topics  . Smoking status: Current Everyday Smoker  . Smokeless tobacco: Not on file  . Alcohol Use: Yes    OB History    Grav Para Term Preterm Abortions TAB SAB Ect Mult Living                  Review of Systems  Constitutional: Negative for fever.       10 Systems reviewed and are negative for acute change except as noted in the HPI. General malaise  HENT: Positive for voice change. Negative for congestion.   Eyes: Negative for discharge and redness.  Respiratory: Negative for cough and shortness of breath.   Cardiovascular: Negative for chest pain.  Gastrointestinal: Negative for vomiting and abdominal pain.  Musculoskeletal: Negative for back pain.  Skin: Negative for rash.  Neurological: Negative for syncope, numbness and headaches.  Psychiatric/Behavioral:       No behavior change.     Allergies  Beta adrenergic blockers; Peanut-containing drug products; Shrimp; Avelox; Ciprofloxacin; Moxifloxacin; and Sulfamethoxazole w-trimethoprim  Home Medications   Current Outpatient Rx  Name Route Sig Dispense Refill  . ALBUTEROL SULFATE HFA 108 (90 BASE) MCG/ACT IN AERS Inhalation Inhale 1 puff into the lungs daily as needed. For shortness of breath    .  ALPRAZOLAM 1 MG PO TABS Oral Take 1 mg by mouth 4 (four) times daily.     . AMOXICILLIN 500 MG PO CAPS Oral Take by mouth as directed. For dental appt.     . AZELASTINE HCL 0.05 % OP SOLN Both Eyes Place 1 drop into both eyes 2 (two) times daily.    Marland Kitchen BENZONATATE 100 MG PO CAPS Oral Take 200 mg by mouth 3 (three) times daily as needed.    . CEFTIN PO Oral Take 1 tablet by mouth 2 (two) times daily.    Marland Kitchen CETIRIZINE HCL 10 MG PO TABS Oral Take 10 mg by mouth daily.      . CYCLOBENZAPRINE HCL 10 MG PO TABS Oral Take 20 mg by mouth at bedtime.      Marland Kitchen DOXYCYCLINE HYCLATE 100 MG PO CAPS Oral Take 1 capsule (100  mg total) by mouth 2 (two) times daily. 20 capsule 0  . ESOMEPRAZOLE MAGNESIUM 40 MG PO CPDR Oral Take 40 mg by mouth 2 (two) times daily.      Marland Kitchen HYDROXYZINE HCL 10 MG PO TABS Oral Take 10 mg by mouth every 6 (six) hours as needed.      Marland Kitchen K-DUR 20 MEQ PO TBCR  TAKE (1) TABLET TWICE DAILY. 60 each 5  . LASIX 40 MG PO TABS  TAKE (1) TABLET TWICE DAILY. 60 each 12  . LUBIPROSTONE 24 MCG PO CAPS Oral Take 24 mcg by mouth 2 (two) times daily with a meal.    . MELOXICAM 15 MG PO TABS Oral Take 15 mg by mouth daily.      Marland Kitchen MONTELUKAST SODIUM 10 MG PO TABS Oral Take 10 mg by mouth daily.      . OXYCODONE-ACETAMINOPHEN 10-325 MG PO TABS Oral Take 1 tablet by mouth 5 (five) times daily.     Marland Kitchen OXYMORPHONE HCL ER 20 MG PO TB12 Oral Take 20 mg by mouth 2 (two) times daily.      Marland Kitchen ROSUVASTATIN CALCIUM 10 MG PO TABS Oral Take 1 tablet (10 mg total) by mouth at bedtime. 30 tablet 6  . SERTRALINE HCL 100 MG PO TABS Oral Take 100 mg by mouth daily.    . SUMATRIPTAN SUCCINATE 100 MG PO TABS Oral Take 100 mg by mouth as needed.    Marland Kitchen EPINEPHRINE 0.3 MG/0.3ML IJ DEVI Intramuscular Inject into the muscle as needed.        BP 129/73  Pulse 78  Temp(Src) 98.1 F (36.7 C) (Oral)  Resp 18  Ht 5\' 5"  (1.651 m)  Wt 143 lb (64.864 kg)  BMI 23.80 kg/m2  SpO2 99%  Physical Exam  Nursing note and vitals reviewed. Constitutional: She is oriented to person, place, and time. She appears well-developed and well-nourished.  HENT:  Head: Normocephalic and atraumatic.  Eyes: Conjunctivae and EOM are normal. Pupils are equal, round, and reactive to light.  Neck: Normal range of motion. Neck supple.  Cardiovascular: Normal rate and regular rhythm.   Murmur (Systolic murmur) heard. Pulmonary/Chest: Effort normal and breath sounds normal.  Abdominal: Soft. Bowel sounds are normal.  Musculoskeletal: Normal range of motion.  Neurological: She is alert and oriented to person, place, and time.  Skin: Skin is warm and dry.    Psychiatric: She has a normal mood and affect.    ED Course  Procedures (including critical care time) DIAGNOSTIC STUDIES: Oxygen Saturation is 99% on room air, normal by my interpretation.    COORDINATION OF CARE: 11:55PM -  Patient informed of current plan for treatment and evaluation and agrees with plan at this time.       MDM  Patient here with complaint of general malaise and laryngitis. She is being treated for bronchitis with Ceftin by her primary care physician and doxycycline for prevention [facility] spotted fever or due to a tick bite. She and her daughter both are asking that labs be drawn to show that she does or does not have either RSMF or Lyme disease. I spoke with them about titers, the frequency and course of the illness. They still are asking that labs be drawn. CBC, BMET, acute and convalescent titers for RSMF and the Lyme antibody have been drawn. Follow up will be with her PCP. Pt stable in ED with no significant deterioration in condition.The patient appears reasonably screened and/or stabilized for discharge and I doubt any other medical condition or other [facility] requiring further screening, evaluation, or treatment in the ED at this time prior to discharge.  I personally performed the services described in this documentation, which was scribed in my presence. The recorded information has been reviewed and considered.   MDM Reviewed: nursing note and vitals Interpretation: labs           Nicoletta Dress. Colon Branch, MD 07/15/11 2130

## 2011-07-14 NOTE — ED Notes (Signed)
Pt reports she was here a week ago after having a tick bite, reports she is currently on abts but does not feel like she is getting any better

## 2011-07-15 LAB — BASIC METABOLIC PANEL
Chloride: 99 mEq/L (ref 96–112)
Creatinine, Ser: 0.79 mg/dL (ref 0.50–1.10)
GFR calc Af Amer: >90 mL/min (ref >90)
GFR calc non Af Amer: >90 mL/min (ref >90)
Potassium: 3.7 mEq/L (ref 3.5–5.1)

## 2011-07-15 LAB — DIFFERENTIAL
Basophils Absolute: 0 10*3/uL (ref 0.0–0.1)
Basophils Relative: 0 % (ref 0–1)
Eosinophils Absolute: 0.2 10*3/uL (ref 0.0–0.7)
Monocytes Absolute: 0.6 10*3/uL (ref 0.1–1.0)
Neutro Abs: 3 10*3/uL (ref 1.7–7.7)
Neutrophils Relative %: 41 % — ABNORMAL LOW (ref 43–77)

## 2011-07-15 LAB — CBC
MCHC: 34.3 g/dL (ref 30.0–36.0)
RDW: 12.7 % (ref 11.5–15.5)

## 2011-07-15 NOTE — Discharge Instructions (Signed)
Drink lots of fluids. Finish all of your antibiotics. Your bloodwork has been drawn. Dr. Juanetta Gosling can check those results. Followup with Dr. Abbe Amsterdam.

## 2011-07-17 LAB — ROCKY MTN SPOTTED FVR AB, IGG-BLOOD: RMSF IgG: 0.14 IV

## 2011-08-29 ENCOUNTER — Ambulatory Visit (HOSPITAL_COMMUNITY): Payer: Medicaid Other | Attending: Cardiology | Admitting: Radiology

## 2011-08-29 DIAGNOSIS — I319 Disease of pericardium, unspecified: Secondary | ICD-10-CM | POA: Insufficient documentation

## 2011-08-29 DIAGNOSIS — J4489 Other specified chronic obstructive pulmonary disease: Secondary | ICD-10-CM | POA: Insufficient documentation

## 2011-08-29 DIAGNOSIS — I517 Cardiomegaly: Secondary | ICD-10-CM | POA: Insufficient documentation

## 2011-08-29 DIAGNOSIS — J449 Chronic obstructive pulmonary disease, unspecified: Secondary | ICD-10-CM | POA: Insufficient documentation

## 2011-08-29 DIAGNOSIS — I2510 Atherosclerotic heart disease of native coronary artery without angina pectoris: Secondary | ICD-10-CM | POA: Insufficient documentation

## 2011-08-29 DIAGNOSIS — I059 Rheumatic mitral valve disease, unspecified: Secondary | ICD-10-CM | POA: Insufficient documentation

## 2011-08-29 DIAGNOSIS — R0989 Other specified symptoms and signs involving the circulatory and respiratory systems: Secondary | ICD-10-CM | POA: Insufficient documentation

## 2011-08-29 DIAGNOSIS — R0609 Other forms of dyspnea: Secondary | ICD-10-CM | POA: Insufficient documentation

## 2011-08-29 DIAGNOSIS — F17200 Nicotine dependence, unspecified, uncomplicated: Secondary | ICD-10-CM | POA: Insufficient documentation

## 2011-08-29 DIAGNOSIS — I38 Endocarditis, valve unspecified: Secondary | ICD-10-CM

## 2011-08-29 NOTE — Progress Notes (Signed)
Echocardiogram performed.  

## 2011-08-31 ENCOUNTER — Encounter: Payer: Self-pay | Admitting: Cardiology

## 2011-08-31 ENCOUNTER — Ambulatory Visit (INDEPENDENT_AMBULATORY_CARE_PROVIDER_SITE_OTHER): Payer: Medicaid Other | Admitting: Cardiology

## 2011-08-31 VITALS — BP 112/70 | HR 91 | Ht 65.0 in | Wt 144.0 lb

## 2011-08-31 DIAGNOSIS — R002 Palpitations: Secondary | ICD-10-CM

## 2011-08-31 DIAGNOSIS — R0609 Other forms of dyspnea: Secondary | ICD-10-CM

## 2011-08-31 DIAGNOSIS — J4489 Other specified chronic obstructive pulmonary disease: Secondary | ICD-10-CM

## 2011-08-31 DIAGNOSIS — J449 Chronic obstructive pulmonary disease, unspecified: Secondary | ICD-10-CM

## 2011-08-31 DIAGNOSIS — F17200 Nicotine dependence, unspecified, uncomplicated: Secondary | ICD-10-CM

## 2011-08-31 DIAGNOSIS — I38 Endocarditis, valve unspecified: Secondary | ICD-10-CM

## 2011-08-31 DIAGNOSIS — I712 Thoracic aortic aneurysm, without rupture, unspecified: Secondary | ICD-10-CM

## 2011-08-31 DIAGNOSIS — R0989 Other specified symptoms and signs involving the circulatory and respiratory systems: Secondary | ICD-10-CM

## 2011-08-31 NOTE — Assessment & Plan Note (Signed)
Increased for echocardiography. Clinical exam is stable and she only has mild aortic insufficiency. Repeat in one year.

## 2011-08-31 NOTE — Progress Notes (Signed)
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  She has no complaints he said going through menopause. She denies any chest pain, palpitations, presyncope or syncope orthopnea PND or edema.  Her echocardiogram last summer was stable. Her aortic root measured 44 mm. Of  Past Medical History  Diagnosis Date  . Palpitations   . 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 Aortic stenosis, severe     Bicuspid valve  . Valvular heart disease   . Migraine headache   . Bronchitis   . COPD (chronic obstructive pulmonary disease)   . CHF (congestive heart failure)   . Asthma     Current Outpatient Prescriptions  Medication Sig Dispense Refill  . albuterol (VENTOLIN HFA) 108 (90 BASE) MCG/ACT inhaler Inhale 1 puff into the lungs daily as needed. For shortness of breath      . ALPRAZolam (XANAX) 1 MG tablet Take 1 mg by mouth 4 (four) times daily.       Marland Kitchen amoxicillin (AMOXIL) 500 MG capsule Take by mouth as directed. For dental appt.       Marland Kitchen azelastine (OPTIVAR) 0.05 % ophthalmic solution Place 1 drop into both eyes 2 (two) times daily.      . benzonatate (TESSALON) 100 MG capsule Take 200 mg by mouth 3 (three) times daily as needed.      . cetirizine (ZYRTEC) 10 MG tablet Take 10 mg by mouth daily.        . cyclobenzaprine (FLEXERIL) 10 MG tablet Take 20 mg by mouth at bedtime.        Marland Kitchen EPINEPHrine (EPIPEN) 0.3 mg/0.3 mL DEVI Inject into the muscle as needed.        Marland Kitchen esomeprazole (NEXIUM) 40 MG capsule Take 40 mg by mouth 2 (two) times daily.        . hydrOXYzine (ATARAX) 10 MG tablet Take 10 mg by mouth every 6 (six) hours as needed.        Marland Kitchen K-DUR 20 MEQ tablet TAKE (1) TABLET TWICE DAILY.  60 each  5  . LASIX 40 MG tablet TAKE  (1) TABLET TWICE DAILY.  60 each  12  . lubiprostone (AMITIZA) 24 MCG capsule Take 24 mcg by mouth 2 (two) times daily with a meal.      . meloxicam (MOBIC) 15 MG tablet Take 15 mg by mouth daily.        . montelukast (SINGULAIR) 10 MG tablet Take 10 mg by mouth daily.        Marland Kitchen oxyCODONE-acetaminophen (PERCOCET) 10-325 MG per tablet Take 1 tablet by mouth 5 (five) times daily.       Marland Kitchen oxymorphone (OPANA ER) 20 MG 12 hr tablet Take 20 mg by mouth 2 (two) times daily.        . rosuvastatin (CRESTOR) 10 MG tablet Take 1 tablet (10 mg total) by mouth at bedtime.  30 tablet  6  . sertraline (ZOLOFT) 100 MG tablet Take 100 mg by mouth daily.      . SUMAtriptan (IMITREX) 100 MG tablet Take 100 mg by mouth as needed.        Allergies  Allergen Reactions  . Beta Adrenergic Blockers Anaphylaxis  . Peanut-Containing Drug Products Shortness Of Breath and Swelling    Swelling  of the throat  . Shrimp (Shellfish Allergy) Anaphylaxis  . Avelox (Moxifloxacin Hcl In Nacl) Rash  . Ciprofloxacin Rash  . Moxifloxacin Rash  . Sulfamethoxazole W-Trimethoprim Rash    Family History  Problem Relation Age of Onset  . Cancer Mother     Type unknown  . Heart attack Father   . Diabetes      family history  . Asthma Brother   . Hyperlipidemia      family history    History   Social History  . Marital Status: Legally Separated    Spouse Name: N/A    Number of Children: N/A  . Years of Education: N/A   Occupational History  . Not on file.   Social History Main Topics  . Smoking status: Current Everyday Smoker  . Smokeless tobacco: Not on file  . Alcohol Use: Yes  . Drug Use: No  . Sexually Active:    Other Topics Concern  . Not on file   Social History Narrative  . No narrative on file    ROS ALL NEGATIVE EXCEPT THOSE NOTED IN HPI  PE  General Appearance: well developed, well nourished in no acute distress HEENT: symmetrical face, PERRLA, good dentition  Neck: no JVD, thyromegaly,  or adenopathy, trachea midline Chest: symmetric without deformity Cardiac: PMI non-displaced, RRR, normal S1, S2, no gallop or murmur Lung: clear to ausculation and percussion Vascular: all pulses full without bruits  Abdominal: nondistended, nontender, good bowel sounds, no HSM, no bruits Extremities: no cyanosis, clubbing or edema, no sign of DVT, no varicosities  Skin: normal color, no rashes Neuro: alert and oriented x 3, non-focal Pysch: normal affect  EKG Normal sinus rhythm, biatrial enlargement, rightward axis, pulmonary disease pattern, nonspecific ST segment changes BMET    Component Value Date/Time   NA 135 07/15/2011 0041   K 3.7 07/15/2011 0041   CL 99 07/15/2011 0041   CO2 27 07/15/2011 0041   GLUCOSE 132* 07/15/2011 0041   BUN 23 07/15/2011 0041   CREATININE 0.79 07/15/2011 0041   CALCIUM 9.0 07/15/2011 0041   GFRNONAA >90 07/15/2011 0041   GFRAA >90 07/15/2011 0041    Lipid Panel     Component Value Date/Time   CHOL 193 06/01/2011 1330   TRIG 129 06/01/2011 1330   HDL 51 06/01/2011 1330   CHOLHDL 3.8 06/01/2011 1330   VLDL 26 06/01/2011 1330   LDLCALC 116* 06/01/2011 1330    CBC    Component Value Date/Time   WBC 7.2 07/15/2011 0041   RBC 4.91 07/15/2011 0041   HGB 14.8 07/15/2011 0041   HCT 43.2 07/15/2011 0041   PLT 217 07/15/2011 0041   MCV 88.0 07/15/2011 0041   MCH 30.1 07/15/2011 0041   MCHC 34.3 07/15/2011 0041   RDW 12.7 07/15/2011 0041   LYMPHSABS 3.4 07/15/2011 0041   MONOABS 0.6 07/15/2011 0041   EOSABS 0.2 07/15/2011 0041   BASOSABS 0.0 07/15/2011 0041

## 2011-08-31 NOTE — Assessment & Plan Note (Signed)
Echocardiogram from 08/29/2011 shows an EF of 55-60%, no LV dilatation, no regional Yehia Mcbain motion abnormalities, mild diastolic dysfunction, mild aortic regurgitation with a slightly thickened aortic valve homograft, and moderate dilatation of the descending aorta. The aorta now measures 48.6.  Is stable overall clinically. She'll need repeat echocardiogram in a year. She developed significant aortic insufficiency and/or aortic root greater than 55 mm she'll need surgical referral. Discussed with her today.

## 2011-08-31 NOTE — Patient Instructions (Addendum)
Your physician discussed the hazards of tobacco use. Tobacco use cessation is recommended and techniques and options to help you quit were discussed.   Your physician wants you to follow-up in: 1 year. You will receive a reminder letter in the mail two months in advance. If you don't receive a letter, please call our office to schedule the follow-up appointment.   Your physician recommends that you continue on your current medications as directed. Please refer to the Current Medication list given to you today.

## 2011-08-31 NOTE — Assessment & Plan Note (Signed)
Advised to quit.  

## 2011-09-05 ENCOUNTER — Telehealth: Payer: Self-pay | Admitting: Cardiology

## 2011-09-05 ENCOUNTER — Encounter: Payer: Self-pay | Admitting: *Deleted

## 2011-09-05 NOTE — Telephone Encounter (Signed)
Please return call to patient at [phone] regarding med treatment questions.

## 2011-09-05 NOTE — Telephone Encounter (Signed)
Pt calls upset about what she has read on Web MD about aneuyrsm.  She has been reassured that she can continue to care for her son. She does not have to lift her son. She would like to come back to the office in 6 months not 1 year. 6 month recall placed for pt. Reassurance given to pt. She will call back if she has any concerns/problems. Mylo Red RN

## 2011-09-05 NOTE — Telephone Encounter (Signed)
Patient called stated she was confused about what Dr.Wall told her at her last visit.Stated she was told her thoracic aneurysm had increased to a 48 and Dr.Wall said he did not want it to get past a 50.States she normally sees Dr.Wall every 6 months and this time was told to come back in 1 year.Patient states she did not understand since aneurysm had increased why she coming back in 1 year and not 6 months.Also would like to know her restrictions, can she be lifting objects and she has a handicapp son can she be helping him which requires lifting.Patient was told Dr.Wall's nurse not in office today will forward message to her and she will call her back Wed 09/07/11.

## 2011-09-23 ENCOUNTER — Encounter: Payer: Self-pay | Admitting: *Deleted

## 2011-09-23 ENCOUNTER — Telehealth: Payer: Self-pay | Admitting: Cardiology

## 2011-09-23 ENCOUNTER — Telehealth: Payer: Self-pay | Admitting: *Deleted

## 2011-09-23 NOTE — Telephone Encounter (Signed)
Pt walked in requesting a need for surgical Clearance from Dr Daleen Squibb for neck surgery before date can be set. Letter to go to Dr Jeral Fruit, please call pt and fax letter. Will route to DR/Nurse

## 2011-09-23 NOTE — Telephone Encounter (Signed)
Pt aware she has been cleared for surgery by Dr. Daleen Squibb. Letter of clearance sent to Dr. Jeral Fruit. Mylo Red RN

## 2011-09-23 NOTE — Telephone Encounter (Signed)
Walk In pt Form " Pt Needs Surgical Clearance" sent to Message Nurse 09/23/11/KM

## 2011-09-23 NOTE — Telephone Encounter (Signed)
She can be cleared. She stopped smoking for at least a couple weeks before surgery. That is her greatest risk for surgery.

## 2011-09-26 ENCOUNTER — Encounter (HOSPITAL_COMMUNITY): Payer: Self-pay | Admitting: Pharmacy Technician

## 2011-09-26 ENCOUNTER — Other Ambulatory Visit: Payer: Self-pay | Admitting: Neurosurgery

## 2011-09-27 ENCOUNTER — Other Ambulatory Visit: Payer: Self-pay | Admitting: Cardiology

## 2011-09-29 NOTE — Consult Note (Addendum)
Anesthesia Chart Review:  Patient is a 51 year old female posted for two level ACDF by Dr. Jeral Fruit on 10/05/11.  Her PAT visit is on 09/30/11.  Today I am reviewing her available Cardiac notes.  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  Her Cardiologist is Dr. Daleen Squibb.  He saw her on 08/31/11.  EKG then showed Normal sinus rhythm, biatrial enlargement, rightward axis, pulmonary disease pattern, nonspecific ST segment changes. He cleared her for this procedure without any additional cardiac testing until next year.  He plans to refer her for CT surgery evaluation when and if she develops significant aortic insufficiency and/or aortic root greater than 55 mm.  2D echo on 08/29/11 showed: - Left ventricle: The cavity size was normal. Wall thickness was normal. Systolic function was normal. The estimated ejection fraction was in the range of 55% to 60%. Wall motion was normal; there were no regional wall motion abnormalities. Features are consistent with a pseudonormal left ventricular filling pattern, with concomitant abnormal relaxation and increased filling pressure (grade 2 diastolic dysfunction). - Aortic valve: Mild regurgitation. - Ascending aorta: The ascending aorta was moderately dilated. (Mid-AAo 48.62 mm)  - Mitral valve: Calcified annulus. Mild regurgitation. - Pulmonic valve: Peak gradient: 14mm Hg (S). Impressions: - S/P Ross procedure; no significant PS; mild AI; ascending aorta moderately dilated; suggest CTA or MRA to better Assess.  (Dr. Daleen Squibb did not order a CTA or MRI.  His note says he will reassess in 1 year.)  Additional studies pending her PAT visit.  Shonna Chock, PA-C 09/29/11 1727  Addendum: 10/04/11 1445 Preoperative labs noted.    CXR from 09/30/11 showed: 1. No acute cardiopulmonary disease.  2. Faint  density at the left apex suspicious for small pulmonary nodule. Follow-up noncontrast chest CT recommended.  This was called to Shanda Bumps at Dr. Cassandria Santee office earlier this week.  Patient subsequently had a chest CT without contrast on 10/04/11 that showed: 1. Moderate to severe emphysema.  2. Small nodule in the left upper lobe measures 3.9 mm.If the patient is at high risk for bronchogenic carcinoma, follow-up chest CT at 1 year is recommended. If the patient is at low risk, no follow-up is needed. This recommendation follows the consensus statement: Guidelines for Management of Small Pulmonary Nodules Detected on CT Scans: A Statement from the Fleischner Society as  published in Radiology 2005; 237:395-400.  Dr. Jeral Fruit can make recommendations regarding future follow-up.  If not acute cardiopulmonary symptoms then anticipate she can proceed as planned.

## 2011-09-30 ENCOUNTER — Encounter (HOSPITAL_COMMUNITY)
Admission: RE | Admit: 2011-09-30 | Discharge: 2011-09-30 | Disposition: A | Discharge status: Home / Self Care | Payer: Medicaid Other | Source: Ambulatory Visit | Attending: Neurosurgery | Admitting: Neurosurgery

## 2011-09-30 ENCOUNTER — Encounter (HOSPITAL_COMMUNITY): Payer: Self-pay

## 2011-09-30 ENCOUNTER — Other Ambulatory Visit: Payer: Self-pay | Admitting: Cardiology

## 2011-09-30 HISTORY — DX: Cardiac arrhythmia, unspecified: I49.9

## 2011-09-30 HISTORY — DX: Anxiety disorder, unspecified: F41.9

## 2011-09-30 HISTORY — DX: Gastro-esophageal reflux disease without esophagitis: K21.9

## 2011-09-30 HISTORY — DX: Unspecified osteoarthritis, unspecified site: M19.90

## 2011-09-30 HISTORY — DX: Aortic aneurysm of unspecified site, without rupture: I71.9

## 2011-09-30 LAB — SURGICAL PCR SCREEN: MRSA, PCR: NEGATIVE

## 2011-09-30 LAB — CBC
HCT: 44.1 % (ref 36.0–46.0)
Hemoglobin: 15 g/dL (ref 12.0–15.0)
MCH: 30.1 pg (ref 26.0–34.0)
MCHC: 34 g/dL (ref 30.0–36.0)
MCV: 88.6 fL (ref 78.0–100.0)
RDW: 13.9 % (ref 11.5–15.5)

## 2011-09-30 LAB — BASIC METABOLIC PANEL
BUN: 14 mg/dL (ref 6–23)
Chloride: 101 mEq/L (ref 96–112)
Creatinine, Ser: 0.78 mg/dL (ref 0.50–1.10)
GFR calc Af Amer: >90 mL/min (ref >90)
Glucose, Bld: 100 mg/dL — ABNORMAL HIGH (ref 70–99)

## 2011-09-30 MED ORDER — ROSUVASTATIN CALCIUM 10 MG PO TABS: 10.0000 mg | ORAL_TABLET | Freq: Every day | ORAL | Status: DC

## 2011-09-30 NOTE — Progress Notes (Addendum)
Note from dr wall, echo, ekg, stress in epic from 7/13, clearance on chart

## 2011-09-30 NOTE — Telephone Encounter (Signed)
Patient calling for refill of crestor. She has not had her medicine in over a month now  Due to prior authorization.  Please return call to patient who would like refill expedited as well as crestor samples. She can be reached at hm#

## 2011-09-30 NOTE — Pre-Procedure Instructions (Addendum)
21 Lindsay Bonilla  09/30/2011   Your procedure is scheduled on:  10/05/11  Report to [facility] at 700 AM.  Call this number if you have problems the morning of surgery: [phone]   Remember:   Do not eat food or drink:After Midnight.    Take these medicines the morning of surgery with A SIP OF WATER: singulair,zrytec, inhaler, xanax, eye drops, pain med, flexeril, nexium  STOP nsaids, aspirin, herbal meds , blood thinners now   Do not wear jewelry, make-up or nail polish.  Do not wear lotions, powders, or perfumes. You may wear deodorant.  Do not shave 48 hours prior to surgery. Men may shave face and neck.  Do not bring valuables to the hospital.  Contacts, dentures or bridgework may not be worn into surgery.  Leave suitcase in the car. After surgery it may be brought to your room.  For patients admitted to the hospital, checkout time is 11:00 AM the day of discharge.   Patients discharged the day of surgery will not be allowed to drive home.  Name and phone number of your driver: Marchelle Folks dtr [phone]  Spouse danny [phone]  Special Instructions: CHG Shower Use Special Wash: 1/2 bottle night before surgery and 1/2 bottle morning of surgery.   Please read over the following fact sheets that you were given: Pain Booklet, Coughing and Deep Breathing, MRSA Information and Surgical Site Infection Prevention

## 2011-09-30 NOTE — Telephone Encounter (Signed)
Patient walked in office wanting crestor samples.Crestor 10 mg samples given to patient.

## 2011-10-03 ENCOUNTER — Other Ambulatory Visit (HOSPITAL_COMMUNITY): Payer: Self-pay | Admitting: Neurosurgery

## 2011-10-03 DIAGNOSIS — R911 Solitary pulmonary nodule: Secondary | ICD-10-CM

## 2011-10-04 ENCOUNTER — Ambulatory Visit (HOSPITAL_COMMUNITY)
Admission: RE | Admit: 2011-10-04 | Discharge: 2011-10-04 | Disposition: A | Discharge status: Home / Self Care | Payer: Medicaid Other | Source: Ambulatory Visit | Attending: Neurosurgery | Admitting: Neurosurgery

## 2011-10-04 DIAGNOSIS — R911 Solitary pulmonary nodule: Secondary | ICD-10-CM

## 2011-10-04 MED ORDER — CEFAZOLIN SODIUM-DEXTROSE 2-3 GM-% IV SOLR
2.0000 g | INTRAVENOUS | Status: AC
  Administered 2011-10-05: 2 g via INTRAVENOUS
  Filled 2011-10-04: qty 50

## 2011-10-05 ENCOUNTER — Encounter (HOSPITAL_COMMUNITY): Payer: Self-pay

## 2011-10-05 ENCOUNTER — Inpatient Hospital Stay (HOSPITAL_COMMUNITY): Payer: Medicaid Other

## 2011-10-05 ENCOUNTER — Inpatient Hospital Stay (HOSPITAL_COMMUNITY): Payer: Medicaid Other | Admitting: Vascular Surgery

## 2011-10-05 ENCOUNTER — Encounter (HOSPITAL_COMMUNITY): Payer: Self-pay | Admitting: *Deleted

## 2011-10-05 ENCOUNTER — Encounter (HOSPITAL_COMMUNITY)
Admission: RE | Disposition: A | Discharge status: Home / Self Care | Payer: Self-pay | Source: Ambulatory Visit | Attending: Neurosurgery

## 2011-10-05 ENCOUNTER — Encounter (HOSPITAL_COMMUNITY): Payer: Self-pay | Admitting: Vascular Surgery

## 2011-10-05 ENCOUNTER — Inpatient Hospital Stay (HOSPITAL_COMMUNITY)
Admission: RE | Admit: 2011-10-05 | Discharge: 2011-10-07 | DRG: 473 | Disposition: A | Discharge status: Home / Self Care | Payer: Medicaid Other | Source: Ambulatory Visit | Attending: Neurosurgery | Admitting: Neurosurgery

## 2011-10-05 DIAGNOSIS — F411 Generalized anxiety disorder: Secondary | ICD-10-CM | POA: Diagnosis present

## 2011-10-05 DIAGNOSIS — F17200 Nicotine dependence, unspecified, uncomplicated: Secondary | ICD-10-CM | POA: Diagnosis present

## 2011-10-05 DIAGNOSIS — I509 Heart failure, unspecified: Secondary | ICD-10-CM | POA: Diagnosis present

## 2011-10-05 DIAGNOSIS — Z882 Allergy status to sulfonamides status: Secondary | ICD-10-CM

## 2011-10-05 DIAGNOSIS — Z90710 Acquired absence of both cervix and uterus: Secondary | ICD-10-CM

## 2011-10-05 DIAGNOSIS — J4489 Other specified chronic obstructive pulmonary disease: Secondary | ICD-10-CM | POA: Diagnosis present

## 2011-10-05 DIAGNOSIS — M5020 Other cervical disc displacement, unspecified cervical region: Principal | ICD-10-CM | POA: Diagnosis present

## 2011-10-05 DIAGNOSIS — J449 Chronic obstructive pulmonary disease, unspecified: Secondary | ICD-10-CM | POA: Diagnosis present

## 2011-10-05 DIAGNOSIS — K219 Gastro-esophageal reflux disease without esophagitis: Secondary | ICD-10-CM | POA: Diagnosis present

## 2011-10-05 DIAGNOSIS — M129 Arthropathy, unspecified: Secondary | ICD-10-CM | POA: Diagnosis present

## 2011-10-05 DIAGNOSIS — Z888 Allergy status to other drugs, medicaments and biological substances status: Secondary | ICD-10-CM

## 2011-10-05 DIAGNOSIS — R911 Solitary pulmonary nodule: Secondary | ICD-10-CM | POA: Diagnosis present

## 2011-10-05 DIAGNOSIS — Z9089 Acquired absence of other organs: Secondary | ICD-10-CM

## 2011-10-05 DIAGNOSIS — Z954 Presence of other heart-valve replacement: Secondary | ICD-10-CM

## 2011-10-05 DIAGNOSIS — M5030 Other cervical disc degeneration, unspecified cervical region: Secondary | ICD-10-CM | POA: Diagnosis present

## 2011-10-05 DIAGNOSIS — Z881 Allergy status to other antibiotic agents status: Secondary | ICD-10-CM

## 2011-10-05 HISTORY — PX: ANTERIOR CERVICAL DECOMP/DISCECTOMY FUSION: SHX1161

## 2011-10-05 HISTORY — DX: Shortness of breath: R06.02

## 2011-10-05 SURGERY — ANTERIOR CERVICAL DECOMPRESSION/DISCECTOMY FUSION 2 LEVELS
Anesthesia: General | Site: Neck | Wound class: Clean

## 2011-10-05 MED ORDER — PANTOPRAZOLE SODIUM 40 MG PO TBEC
40.0000 mg | DELAYED_RELEASE_TABLET | Freq: Every day | ORAL | Status: DC
  Administered 2011-10-06 – 2011-10-07 (×2): 40 mg via ORAL
  Filled 2011-10-05: qty 1

## 2011-10-05 MED ORDER — ROCURONIUM BROMIDE 100 MG/10ML IV SOLN
INTRAVENOUS | Status: DC | PRN
  Administered 2011-10-05: 50 mg via INTRAVENOUS

## 2011-10-05 MED ORDER — PNEUMOCOCCAL VAC POLYVALENT 25 MCG/0.5ML IJ INJ
0.5000 mL | INJECTION | INTRAMUSCULAR | Status: AC
  Administered 2011-10-06: 0.5 mL via INTRAMUSCULAR
  Filled 2011-10-05: qty 0.5

## 2011-10-05 MED ORDER — FENTANYL CITRATE 0.05 MG/ML IJ SOLN
INTRAMUSCULAR | Status: DC | PRN
  Administered 2011-10-05 (×3): 50 ug via INTRAVENOUS
  Administered 2011-10-05: 100 ug via INTRAVENOUS

## 2011-10-05 MED ORDER — PROMETHAZINE HCL 25 MG/ML IJ SOLN: 6.2500 mg | INTRAMUSCULAR | Status: DC | PRN

## 2011-10-05 MED ORDER — HYDROXYZINE HCL 10 MG PO TABS
10.0000 mg | ORAL_TABLET | Freq: Four times a day (QID) | ORAL | Status: DC | PRN
  Filled 2011-10-05: qty 1

## 2011-10-05 MED ORDER — LACTATED RINGERS IV SOLN
INTRAVENOUS | Status: DC | PRN
  Administered 2011-10-05 (×2): via INTRAVENOUS

## 2011-10-05 MED ORDER — ONDANSETRON HCL 4 MG/2ML IJ SOLN: 4.0000 mg | INTRAMUSCULAR | Status: DC | PRN

## 2011-10-05 MED ORDER — ALBUTEROL SULFATE HFA 108 (90 BASE) MCG/ACT IN AERS
1.0000 | INHALATION_SPRAY | RESPIRATORY_TRACT | Status: DC | PRN
  Filled 2011-10-05: qty 6.7

## 2011-10-05 MED ORDER — DEXAMETHASONE SODIUM PHOSPHATE 4 MG/ML IJ SOLN
4.0000 mg | Freq: Four times a day (QID) | INTRAMUSCULAR | Status: DC
  Administered 2011-10-05 – 2011-10-06 (×3): 4 mg via INTRAVENOUS
  Filled 2011-10-05 (×11): qty 1

## 2011-10-05 MED ORDER — MIDAZOLAM HCL 5 MG/5ML IJ SOLN
INTRAMUSCULAR | Status: DC | PRN
  Administered 2011-10-05: 2 mg via INTRAVENOUS

## 2011-10-05 MED ORDER — FUROSEMIDE 40 MG PO TABS
40.0000 mg | ORAL_TABLET | Freq: Two times a day (BID) | ORAL | Status: DC
  Administered 2011-10-05 – 2011-10-07 (×4): 40 mg via ORAL
  Filled 2011-10-05 (×6): qty 1

## 2011-10-05 MED ORDER — HYDROMORPHONE HCL PF 1 MG/ML IJ SOLN
0.5000 mg | INTRAMUSCULAR | Status: DC | PRN
  Administered 2011-10-05 – 2011-10-06 (×5): 0.5 mg via INTRAVENOUS
  Filled 2011-10-05 (×5): qty 1

## 2011-10-05 MED ORDER — DIAZEPAM 5 MG PO TABS
5.0000 mg | ORAL_TABLET | Freq: Four times a day (QID) | ORAL | Status: DC | PRN
  Administered 2011-10-05 (×2): 5 mg via ORAL
  Administered 2011-10-05: 13:00:00 via ORAL
  Administered 2011-10-06 (×2): 5 mg via ORAL
  Filled 2011-10-05 (×3): qty 1

## 2011-10-05 MED ORDER — MONTELUKAST SODIUM 10 MG PO TABS
10.0000 mg | ORAL_TABLET | Freq: Every day | ORAL | Status: DC
  Administered 2011-10-05 – 2011-10-06 (×2): 10 mg via ORAL
  Filled 2011-10-05 (×3): qty 1

## 2011-10-05 MED ORDER — MEPERIDINE HCL 25 MG/ML IJ SOLN: 6.2500 mg | INTRAMUSCULAR | Status: DC | PRN

## 2011-10-05 MED ORDER — GABAPENTIN 300 MG PO CAPS
300.0000 mg | ORAL_CAPSULE | Freq: Three times a day (TID) | ORAL | Status: DC
  Administered 2011-10-05 – 2011-10-07 (×6): 300 mg via ORAL
  Filled 2011-10-05 (×8): qty 1

## 2011-10-05 MED ORDER — THROMBIN 5000 UNITS EX SOLR
CUTANEOUS | Status: DC | PRN
  Administered 2011-10-05 (×3): 5000 [IU] via TOPICAL

## 2011-10-05 MED ORDER — SODIUM CHLORIDE 0.9 % IV SOLN
INTRAVENOUS | Status: DC
  Administered 2011-10-05: 14:00:00 via INTRAVENOUS

## 2011-10-05 MED ORDER — GLYCOPYRROLATE 0.2 MG/ML IJ SOLN
INTRAMUSCULAR | Status: DC | PRN
  Administered 2011-10-05: .4 mg via INTRAVENOUS

## 2011-10-05 MED ORDER — SODIUM CHLORIDE 0.9 % IJ SOLN: 3.0000 mL | INTRAMUSCULAR | Status: DC | PRN

## 2011-10-05 MED ORDER — ATORVASTATIN CALCIUM 10 MG PO TABS
10.0000 mg | ORAL_TABLET | Freq: Every day | ORAL | Status: DC
  Administered 2011-10-06: 10 mg via ORAL
  Filled 2011-10-05 (×2): qty 1

## 2011-10-05 MED ORDER — ONDANSETRON HCL 4 MG/2ML IJ SOLN
INTRAMUSCULAR | Status: DC | PRN
  Administered 2011-10-05: 4 mg via INTRAVENOUS

## 2011-10-05 MED ORDER — 0.9 % SODIUM CHLORIDE (POUR BTL) OPTIME
TOPICAL | Status: DC | PRN
  Administered 2011-10-05: 1000 mL

## 2011-10-05 MED ORDER — LIDOCAINE HCL (CARDIAC) 20 MG/ML IV SOLN
INTRAVENOUS | Status: DC | PRN
  Administered 2011-10-05: 80 mg via INTRAVENOUS

## 2011-10-05 MED ORDER — ACETAMINOPHEN 325 MG PO TABS: 650.0000 mg | ORAL_TABLET | ORAL | Status: DC | PRN

## 2011-10-05 MED ORDER — SUMATRIPTAN SUCCINATE 100 MG PO TABS
100.0000 mg | ORAL_TABLET | ORAL | Status: DC | PRN
  Filled 2011-10-05: qty 1

## 2011-10-05 MED ORDER — HYDROMORPHONE HCL PF 1 MG/ML IJ SOLN
0.2500 mg | INTRAMUSCULAR | Status: DC | PRN
  Administered 2011-10-05 (×3): 0.5 mg via INTRAVENOUS

## 2011-10-05 MED ORDER — HYDROMORPHONE HCL PF 1 MG/ML IJ SOLN
INTRAMUSCULAR | Status: AC
  Administered 2011-10-05: 14:00:00
  Filled 2011-10-05: qty 1

## 2011-10-05 MED ORDER — MORPHINE SULFATE 4 MG/ML IJ SOLN
4.0000 mg | INTRAMUSCULAR | Status: DC | PRN
  Filled 2011-10-05: qty 1

## 2011-10-05 MED ORDER — LORATADINE 10 MG PO TABS
10.0000 mg | ORAL_TABLET | Freq: Every day | ORAL | Status: DC
  Administered 2011-10-06 – 2011-10-07 (×2): 10 mg via ORAL
  Filled 2011-10-05 (×2): qty 1

## 2011-10-05 MED ORDER — SODIUM CHLORIDE 0.9 % IJ SOLN
3.0000 mL | Freq: Two times a day (BID) | INTRAMUSCULAR | Status: DC
  Administered 2011-10-05: 16:00:00 via INTRAVENOUS
  Administered 2011-10-06 – 2011-10-07 (×3): 3 mL via INTRAVENOUS

## 2011-10-05 MED ORDER — POTASSIUM CHLORIDE CRYS ER 20 MEQ PO TBCR
20.0000 meq | EXTENDED_RELEASE_TABLET | Freq: Every day | ORAL | Status: DC
  Administered 2011-10-06 – 2011-10-07 (×2): 20 meq via ORAL
  Filled 2011-10-05 (×2): qty 1

## 2011-10-05 MED ORDER — ACETAMINOPHEN 650 MG RE SUPP: 650.0000 mg | RECTAL | Status: DC | PRN

## 2011-10-05 MED ORDER — BENZONATATE 100 MG PO CAPS
100.0000 mg | ORAL_CAPSULE | Freq: Three times a day (TID) | ORAL | Status: DC
  Administered 2011-10-05 – 2011-10-07 (×6): 100 mg via ORAL
  Filled 2011-10-05 (×8): qty 1

## 2011-10-05 MED ORDER — DEXAMETHASONE SODIUM PHOSPHATE 4 MG/ML IJ SOLN
INTRAMUSCULAR | Status: DC | PRN
  Administered 2011-10-05: 4 mg via INTRAVENOUS

## 2011-10-05 MED ORDER — PROPOFOL 10 MG/ML IV EMUL
INTRAVENOUS | Status: DC | PRN
  Administered 2011-10-05: 100 mg via INTRAVENOUS
  Administered 2011-10-05: 50 mg via INTRAVENOUS

## 2011-10-05 MED ORDER — MENTHOL 3 MG MT LOZG: 1.0000 | LOZENGE | OROMUCOSAL | Status: DC | PRN

## 2011-10-05 MED ORDER — DEXAMETHASONE 4 MG PO TABS
4.0000 mg | ORAL_TABLET | Freq: Four times a day (QID) | ORAL | Status: DC
  Administered 2011-10-06 – 2011-10-07 (×5): 4 mg via ORAL
  Filled 2011-10-05 (×11): qty 1

## 2011-10-05 MED ORDER — PHENOL 1.4 % MT LIQD
1.0000 | OROMUCOSAL | Status: DC | PRN
  Administered 2011-10-05: 1 via OROMUCOSAL
  Filled 2011-10-05: qty 177

## 2011-10-05 MED ORDER — LUBIPROSTONE 24 MCG PO CAPS
24.0000 ug | ORAL_CAPSULE | Freq: Two times a day (BID) | ORAL | Status: DC
  Administered 2011-10-06 – 2011-10-07 (×3): 24 ug via ORAL
  Filled 2011-10-05 (×6): qty 1

## 2011-10-05 MED ORDER — DIAZEPAM 5 MG PO TABS
ORAL_TABLET | ORAL | Status: AC
  Filled 2011-10-05: qty 1

## 2011-10-05 MED ORDER — OXYCODONE-ACETAMINOPHEN 5-325 MG PO TABS
1.0000 | ORAL_TABLET | ORAL | Status: DC | PRN
  Administered 2011-10-05 – 2011-10-07 (×9): 2 via ORAL
  Filled 2011-10-05 (×9): qty 2

## 2011-10-05 MED ORDER — SODIUM CHLORIDE 0.9 % IV SOLN
250.0000 mL | INTRAVENOUS | Status: DC
  Administered 2011-10-05: 14:00:00 via INTRAVENOUS

## 2011-10-05 MED ORDER — NEOSTIGMINE METHYLSULFATE 1 MG/ML IJ SOLN
INTRAMUSCULAR | Status: DC | PRN
  Administered 2011-10-05: 3 mg via INTRAVENOUS

## 2011-10-05 MED ORDER — HYDROMORPHONE HCL PF 1 MG/ML IJ SOLN
INTRAMUSCULAR | Status: AC
  Administered 2011-10-05: 13:00:00
  Filled 2011-10-05: qty 1

## 2011-10-05 MED ORDER — CEFAZOLIN SODIUM 1-5 GM-% IV SOLN
1.0000 g | Freq: Three times a day (TID) | INTRAVENOUS | Status: AC
  Administered 2011-10-05 – 2011-10-06 (×2): 1 g via INTRAVENOUS
  Filled 2011-10-05 (×3): qty 50

## 2011-10-05 SURGICAL SUPPLY — 52 items
APL SKNCLS STERI-STRIP NONHPOA (GAUZE/BANDAGES/DRESSINGS) ×1
BANDAGE GAUZE ELAST BULKY 4 IN (GAUZE/BANDAGES/DRESSINGS) ×4 IMPLANT
BENZOIN TINCTURE PRP APPL 2/3 (GAUZE/BANDAGES/DRESSINGS) ×2 IMPLANT
BIT DRILL SM SPINE QC 12 (BIT) ×1 IMPLANT
BLADE ULTRA TIP 2M (BLADE) ×2 IMPLANT
BUR BARREL STRAIGHT FLUTE 4.0 (BURR) IMPLANT
BUR MATCHSTICK NEURO 3.0 LAGG (BURR) ×2 IMPLANT
CANISTER SUCTION 2500CC (MISCELLANEOUS) ×2 IMPLANT
CLOTH BEACON ORANGE TIMEOUT ST (SAFETY) ×2 IMPLANT
CONT SPEC 4OZ CLIKSEAL STRL BL (MISCELLANEOUS) ×2 IMPLANT
COVER MAYO STAND STRL (DRAPES) ×2 IMPLANT
DRAPE LAPAROTOMY 100X72 PEDS (DRAPES) ×2 IMPLANT
DRAPE MICROSCOPE LEICA (MISCELLANEOUS) ×2 IMPLANT
DRAPE POUCH INSTRU U-SHP 10X18 (DRAPES) ×2 IMPLANT
DURAPREP 6ML APPLICATOR 50/CS (WOUND CARE) ×2 IMPLANT
ELECT REM PT RETURN 9FT ADLT (ELECTROSURGICAL) ×2
ELECTRODE REM PT RTRN 9FT ADLT (ELECTROSURGICAL) ×1 IMPLANT
GAUZE SPONGE 4X4 16PLY XRAY LF (GAUZE/BANDAGES/DRESSINGS) IMPLANT
GLOVE BIOGEL M 8.0 STRL (GLOVE) ×2 IMPLANT
GLOVE BIOGEL PI IND STRL 8 (GLOVE) IMPLANT
GLOVE BIOGEL PI INDICATOR 8 (GLOVE) ×3
GLOVE ECLIPSE 7.5 STRL STRAW (GLOVE) ×1 IMPLANT
GLOVE EXAM NITRILE LRG STRL (GLOVE) IMPLANT
GLOVE EXAM NITRILE MD LF STRL (GLOVE) IMPLANT
GLOVE EXAM NITRILE XL STR (GLOVE) IMPLANT
GLOVE EXAM NITRILE XS STR PU (GLOVE) IMPLANT
GLOVE INDICATOR 8.0 STRL GRN (GLOVE) ×1 IMPLANT
GOWN BRE IMP SLV AUR LG STRL (GOWN DISPOSABLE) ×3 IMPLANT
GOWN BRE IMP SLV AUR XL STRL (GOWN DISPOSABLE) ×1 IMPLANT
GOWN STRL REIN 2XL LVL4 (GOWN DISPOSABLE) IMPLANT
HEAD HALTER (SOFTGOODS) ×2 IMPLANT
HEMOSTAT POWDER KIT SURGIFOAM (HEMOSTASIS) ×1 IMPLANT
KIT BASIN OR (CUSTOM PROCEDURE TRAY) ×2 IMPLANT
KIT ROOM TURNOVER OR (KITS) ×2 IMPLANT
NS IRRIG 1000ML POUR BTL (IV SOLUTION) ×2 IMPLANT
PACK LAMINECTOMY NEURO (CUSTOM PROCEDURE TRAY) ×2 IMPLANT
PATTIES SURGICAL .5 X1 (DISPOSABLE) ×2 IMPLANT
PLATE ANT CERV XTEND 2 LV 36 (Plate) ×1 IMPLANT
PUTTY BONE GRAFT KIT 2.5ML (Bone Implant) ×1 IMPLANT
RUBBERBAND STERILE (MISCELLANEOUS) ×4 IMPLANT
SCREW XTD VAR 4.2 SELF TAP 12 (Screw) ×6 IMPLANT
SPACER ACDF SM LORDOTIC 7 (Spacer) ×2 IMPLANT
SPONGE GAUZE 4X4 12PLY (GAUZE/BANDAGES/DRESSINGS) ×2 IMPLANT
SPONGE INTESTINAL PEANUT (DISPOSABLE) ×2 IMPLANT
SPONGE SURGIFOAM ABS GEL SZ50 (HEMOSTASIS) ×2 IMPLANT
STRIP CLOSURE SKIN 1/2X4 (GAUZE/BANDAGES/DRESSINGS) ×2 IMPLANT
SUT VIC AB 3-0 SH 8-18 (SUTURE) ×3 IMPLANT
SYR 20ML ECCENTRIC (SYRINGE) ×2 IMPLANT
TAPE CLOTH SURG 4X10 WHT LF (GAUZE/BANDAGES/DRESSINGS) ×1 IMPLANT
TOWEL OR 17X24 6PK STRL BLUE (TOWEL DISPOSABLE) ×2 IMPLANT
TOWEL OR 17X26 10 PK STRL BLUE (TOWEL DISPOSABLE) ×2 IMPLANT
WATER STERILE IRR 1000ML POUR (IV SOLUTION) ×2 IMPLANT

## 2011-10-05 NOTE — Progress Notes (Signed)
Stable  No weakness  

## 2011-10-05 NOTE — Anesthesia Preprocedure Evaluation (Addendum)
Anesthesia Evaluation  Patient identified by MRN, date of birth, ID band Patient awake    Reviewed: Allergy & Precautions, H&P , NPO status   Airway Mallampati: I TM Distance: >3 FB Neck ROM: Full    Dental  (+) Dental Advisory Given, Teeth Intact and Partial Upper   Pulmonary asthma , COPD COPD inhaler, Current Smoker,  breath sounds clear to auscultation        Cardiovascular +CHF Rhythm:Regular Rate:Normal + Systolic murmurs and + Diastolic murmurs S/p Ross procedure at [facility] 98', echo shows EF 55-60%, mild aortic regurge, mild aortic dilitation   Neuro/Psych  Headaches, PSYCHIATRIC DISORDERS Anxiety    GI/Hepatic GERD-  ,  Endo/Other    Renal/GU      Musculoskeletal   Abdominal   Peds  Hematology   Anesthesia Other Findings   Reproductive/Obstetrics                         Anesthesia Physical Anesthesia Plan  ASA: III  Anesthesia Plan: General   Post-op Pain Management:    Induction: Intravenous  Airway Management Planned: Oral ETT  Additional Equipment:   Intra-op Plan:   Post-operative Plan: Extubation in OR  Informed Consent: I have reviewed the patients History and Physical, chart, labs and discussed the procedure including the risks, benefits and alternatives for the proposed anesthesia with the patient or authorized representative who has indicated his/her understanding and acceptance.   Dental advisory given  Plan Discussed with: Anesthesiologist, Surgeon and CRNA  Anesthesia Plan Comments:        Anesthesia Quick Evaluation

## 2011-10-05 NOTE — Progress Notes (Signed)
Orthopedic Tech Progress Note Patient Details:  Lindsay Bonilla [birth year] [id number]  Patient ID: Lindsay Bonilla, female   DOB: Jul 09, 1960, 51 y.o.   MRN: [id number]   [name] 10/05/2011, 10:38 AM Drop off in OR

## 2011-10-05 NOTE — Anesthesia Postprocedure Evaluation (Signed)
  Anesthesia Post-op Note  Patient: Lindsay Bonilla  Procedure(s) Performed: Procedure(s) (LRB): ANTERIOR CERVICAL DECOMPRESSION/DISCECTOMY FUSION 2 LEVELS (N/A)  Patient Location: PACU  Anesthesia Type: General  Level of Consciousness: awake  Airway and Oxygen Therapy: Patient Spontanous Breathing  Post-op Pain: mild  Post-op Assessment: Post-op Vital signs reviewed  Post-op Vital Signs: stable  Complications: No apparent anesthesia complications

## 2011-10-05 NOTE — Preoperative (Signed)
Beta Blockers   Reason not to administer Beta Blockers:Not Applicable 

## 2011-10-05 NOTE — Anesthesia Procedure Notes (Signed)
Procedure Name: Intubation Date/Time: 10/05/2011 10:22 AM Performed by: Elon Alas Pre-anesthesia Checklist: Patient identified, Timeout performed, Emergency Drugs available, Suction available and Patient being monitored Patient Re-evaluated:Patient Re-evaluated prior to inductionOxygen Delivery Method: Circle system utilized Preoxygenation: Pre-oxygenation with 100% oxygen Intubation Type: IV induction Ventilation: Mask ventilation without difficulty Laryngoscope Size: Mac and 3 Grade View: Grade I Tube type: Oral Tube size: 7.5 mm Number of attempts: 1 Airway Equipment and Method: Stylet Placement Confirmation: positive ETCO2,  ETT inserted through vocal cords under direct vision and breath sounds checked- equal and bilateral Secured at: 21 cm Tube secured with: Tape Dental Injury: Teeth and Oropharynx as per pre-operative assessment

## 2011-10-05 NOTE — Progress Notes (Signed)
RN notified of abnormal BP

## 2011-10-05 NOTE — Transfer of Care (Signed)
Immediate Anesthesia Transfer of Care Note  Patient: Lindsay Bonilla  Procedure(s) Performed: Procedure(s) (LRB): ANTERIOR CERVICAL DECOMPRESSION/DISCECTOMY FUSION 2 LEVELS (N/A)  Patient Location: PACU  Anesthesia Type: General  Level of Consciousness: sedated and patient cooperative  Airway & Oxygen Therapy: Patient Spontanous Breathing and Patient connected to face mask  Post-op Assessment: Report given to PACU RN and Patient moving all extremities X 4  Post vital signs: Reviewed and stable  Complications: No apparent anesthesia complications

## 2011-10-05 NOTE — Progress Notes (Signed)
Op note [phone]

## 2011-10-05 NOTE — H&P (Signed)
Lindsay Bonilla is an 51 y.o. female.   Chief Complaint: neck pain HPI: patient came with the history on neck pain with radiation to both upper extremities associated with weakness and no better with treatment  Past Medical History  Diagnosis Date  . Palpitations   . 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 Aortic stenosis, severe     Bicuspid valve  . Valvular heart disease   . Migraine headache   . Bronchitis   . COPD (chronic obstructive pulmonary disease)   . CHF (congestive heart failure)   . Asthma   . Aortic aneurysm   . Dysrhythmia     palpitations  . Anxiety   . GERD (gastroesophageal reflux disease)   . Arthritis   . Shortness of breath     Nodule left lung CT done 8/6    Past Surgical History  Procedure Date  . Sigmoidoscopy 10/13/05, 09/19/05  . Colonoscopy 10/13/05  . Aortic valve replacement   . Cholecystectomy   . Abdominal hysterectomy   . Knee arthroscopy     rt x4  x1 lft  . Tonsillectomy   . Tubal ligation   . Arthroscopic repair acl rt  . Diagnostic laparoscopy   . Tympanostomy tube placement   . Myringoplasty w/ fat graft     graft from behind ear    Family History  Problem Relation Age of Onset  . Cancer Mother     Type unknown  . Heart attack Father   . Diabetes      family history  . Asthma Brother   . Hyperlipidemia      family history   Social History:  reports that she has been smoking Cigarettes.  She has a 30 pack-year smoking history. She does not have any smokeless tobacco history on file. She reports that she drinks alcohol. She reports that she does not use illicit drugs.  Allergies:  Allergies  Allergen Reactions  . Beta Adrenergic Blockers Anaphylaxis  . Peanut-Containing Drug Products Shortness Of Breath and Swelling    Swelling of the throat  . Shrimp (Shellfish Allergy) Anaphylaxis  . Avelox (Moxifloxacin Hcl In Nacl) Rash  . Ciprofloxacin Nausea And Vomiting  . Cleocin (Clindamycin Hcl) Rash   . Moxifloxacin Rash  . Sulfamethoxazole W-Trimethoprim Rash  . Tape     White clear itches, rash    Medications Prior to Admission  Medication Sig Dispense Refill  . albuterol (VENTOLIN HFA) 108 (90 BASE) MCG/ACT inhaler Inhale 1 puff into the lungs 4 (four) times daily as needed. For shortness of breath      . ALPRAZolam (XANAX) 1 MG tablet Take 1 mg by mouth 4 (four) times daily as needed. For anxiety      . azelastine (OPTIVAR) 0.05 % ophthalmic solution Place 1 drop into both eyes 2 (two) times daily as needed. For allergies      . benzonatate (TESSALON) 100 MG capsule Take 200 mg by mouth 3 (three) times daily as needed. For cough      . cetirizine (ZYRTEC) 10 MG tablet Take 10 mg by mouth daily.        . cyclobenzaprine (FLEXERIL) 10 MG tablet Take 20 mg by mouth at bedtime.        Marland Kitchen esomeprazole (NEXIUM) 40 MG capsule Take 40 mg by mouth 2 (two) times daily.        . furosemide (LASIX) 40 MG tablet Take 40 mg by mouth 2 (two)  times daily.      Marland Kitchen gabapentin (NEURONTIN) 300 MG capsule Take 300 mg by mouth 3 (three) times daily.      . hydrOXYzine (ATARAX) 10 MG tablet Take 10 mg by mouth every 6 (six) hours as needed. For itching from allergies      . K-DUR 20 MEQ tablet TAKE (1) TABLET TWICE DAILY.  60 each  11  . lubiprostone (AMITIZA) 24 MCG capsule Take 24 mcg by mouth 2 (two) times daily with a meal.      . montelukast (SINGULAIR) 10 MG tablet Take 10 mg by mouth daily.        Marland Kitchen oxyCODONE-acetaminophen (PERCOCET) 10-325 MG per tablet Take 1 tablet by mouth 5 (five) times daily. As needed for pain      . oxymorphone (OPANA ER) 20 MG 12 hr tablet Take 20 mg by mouth 2 (two) times daily.        . rosuvastatin (CRESTOR) 10 MG tablet Take 1 tablet (10 mg total) by mouth daily.  30 tablet  6  . amoxicillin (AMOXIL) 500 MG capsule Take 500 mg by mouth once. For dental appt.      Marland Kitchen EPINEPHrine (EPIPEN) 0.3 mg/0.3 mL DEVI Inject 0.3 mg into the muscle as needed. For allergic reaction        . SUMAtriptan (IMITREX) 100 MG tablet Take 100 mg by mouth as needed. For migraines        No results found for this or any previous visit (from the past 48 hour(s)). Ct Chest Wo Contrast  10/04/2011  *RADIOLOGY REPORT*  Clinical Data: Solitary pulmonary nodule.  CT CHEST WITHOUT CONTRAST  Technique:  Multidetector CT imaging of the chest was performed following the standard protocol without IV contrast.  Comparison: None  Findings:  No axillary or its there is no mediastinal or hilar adenopathy. Calcified atherosclerotic disease affects the thoracic aorta.  No pericardial or pleural effusion.  Moderate to severe changes of emphysema.  No airspace consolidation or pulmonary mass identified.  Within the left upper lobe there is a nodule which measures 3.9 mm, image number 11.  No additional nodules or masses identified.  Review of the visualized bony structures is unremarkable.  Limited images through the upper abdomen shows cholecystectomy clips.  IMPRESSION:  1.  Moderate to severe emphysema. 2.  Small nodule in the left upper lobe measures 3.9 mm.If the patient is at high risk for bronchogenic carcinoma, follow-up chest CT at 1 year is recommended.  If the patient is at low risk, no follow-up is needed.  This recommendation follows the consensus statement: Guidelines for Management of Small Pulmonary Nodules Detected on CT Scans:  A Statement from the Fleischner Society as published in Radiology 2005; 237:395-400.  Original Report Authenticated By: Rosealee Albee, M.D.    Review of Systems  Constitutional: Negative.   HENT: Positive for neck pain.   Eyes: Negative.   Respiratory:       Copd  Cardiovascular:       Aorta valve disease  Gastrointestinal: Negative.   Genitourinary: Negative.   Skin: Negative.   Neurological: Positive for focal weakness.  Endo/Heme/Allergies: Negative.   Psychiatric/Behavioral: Negative.     Blood pressure 121/73, pulse 76, temperature 97.8 F (36.6 C), SpO2  94.00%. Physical Exam hentdtr, 1 plusradiological w/u shows lack of lordosis, ddd at c56 and 67, nl. Neck, decrease of movement secondary to pain. Cv, nl, lungs, wheezing bilaterally. Abdomen,normal. Extremities, nl.NEURO weakness of both biceps and wrist extensors.  Assessment/Plan Decompression and fusion at c56 and67 . Patient knows about risks and benefits.  Addylynn Balin M 10/05/2011, 9:45 AM

## 2011-10-06 MED ORDER — ENSURE COMPLETE PO LIQD
237.0000 mL | Freq: Two times a day (BID) | ORAL | Status: DC
  Administered 2011-10-06: 237 mL via ORAL

## 2011-10-06 NOTE — Progress Notes (Signed)
Patient ID: Lindsay Bonilla, female   DOB: 03/19/1960, 51 y.o.   MRN: [id number] Swallows better, c/o pain between shoulders. No weakness. To be discharge in am. One of my partners to take over her care. To see me in 4 weeks

## 2011-10-06 NOTE — Op Note (Signed)
NAMEJERAE, Lindsay Bonilla                ACCOUNT NO.:  [id number]  MEDICAL RECORD NO.:  [id number]  LOCATION:  [id number]                        FACILITY:  [facility]  PHYSICIAN:  Hilda Lias, M.D.   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  Chronic obstructive pulmonary disease.  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  Chronic obstructive pulmonary disease.  PROCEDURE:  Anterior 5-6, 6-7 diskectomy, decompression of spinal cord, bilateral foraminotomy, interbody fusion with cages, plate.  Microscope.  SURGEON:  Hilda Lias, M.D.  ASSISTANT:  Hewitt Shorts, M.D.  CLINICAL HISTORY:  [name] is a lady who came to my office complaining of neck pain radiates to both upper extremity associated with weakness of the biceps.  The pain is getting worse.  X-ray showed that she has a degenerative disk disease at the level of C5-6, C6-7.  The patient was cleared by the cardiologist.  She has had a chest CT scan, which showed no tumor of the lung.  The report was sent to her primary doctor, Dr. Juanetta Gosling.  PROCEDURE:  The patient was taken to the OR, and after intubation, the left side of the neck was cleaned with DuraPrep.  Drapes were applied. A transverse incision through the skin, subcutaneous tissue, platysma was carried down to the cervical spine.  X-rays showed that we were at the level of L5-6.  The patient had a large osteophyte, which was removed.  We brought the microscope into the area and with the drill, we went straight posteriorly to the posterior ligament.  The patient had quite a bit of osteophyte and decompression of the spinal cord as well as the C6 nerve root was achieved.  The endplate were drilled.  Then, at the level of C6-7, the same procedure was done with the decompression  of the C7 nerve root as well as the spinal cord.  The endplates were drilled and 2 cages with 7 mm height, lordotic with autograft and morselized bone was inserted.  This was followed by a plate using 6 screws.  Lateral cervical spine x-ray showed good position of the plate and screws and the cages.  The area was irrigated.  We waited 10 minutes just to be sure that we achieved good hemostasis.  Once this was done, the wound was closed with Vicryl and Steri-Strips.         ______________________________ Hilda Lias, M.D.    EB/MEDQ  D:  10/05/2011  T:  10/06/2011  Job:  [id number]

## 2011-10-06 NOTE — Care Management Note (Signed)
    Page 1 of 2   10/06/2011     1:59:17 PM   CARE MANAGEMENT NOTE 10/06/2011  Patient:  Lindsay Bonilla, Lindsay Bonilla   Account Number:  [id number]  Date Initiated:  10/06/2011  Documentation initiated by:  Anette Guarneri  Subjective/Objective Assessment:   POD#1 s/p ACDF  needs [facility] services and DME     Action/Plan:   Home with [facility] services   Anticipated DC Date:  10/07/2011   Anticipated DC Plan:  HOME W HOME HEALTH SERVICES      DC Planning Services  CM consult      [facility] Choice  DURABLE MEDICAL EQUIPMENT  HOME HEALTH   Choice offered to / List presented to:  C-1 Patient   DME arranged  3-N-1  Ronita Hipps      DME agency  [facility]     HH arranged  HH-2 PT      [facility] agency  [facility]   Status of service:  Completed, signed off Medicare Important Message given?   (If response is "NO", the following Medicare IM given date fields will be blank) Date Medicare IM given:   Date Additional Medicare IM given:    Discharge Disposition:  HOME W HOME HEALTH SERVICES  Per UR Regulation:  Reviewed for med. necessity/level of care/duration of stay  If discussed at Long Length of Stay Meetings, dates discussed:    Comments:  10/06/11  13:56  Anette Guarneri RN/CM spoke with patient regarding d/c needs. Patient choice for [facility] services is [facility] Patient and OT request shower chair and BSC Contacted [facility] for HHPT, services to be started after discharge shower chair and BSC to be delivered to room prior to discharge.

## 2011-10-06 NOTE — Progress Notes (Signed)
Utilization review completed. Berthold Glace, RN, BSN. 

## 2011-10-06 NOTE — Progress Notes (Signed)
Occupational Therapy Evaluation Patient Details Name: Lindsay Bonilla MRN: [id number] DOB: [birth year] Today's Date: 10/06/2011 Time: 4098-1191 OT Time Calculation (min): 30 min  OT Assessment / Plan / Recommendation Clinical Impression  Pt s/p cervical fusion thus affecting PLOF.  Pt able to perform BADLs and functionaly mobility within room at supervision level.  Educated pt on cervical precautions, and pt verbalized understanding.  No further acute OT needs.    OT Assessment  Patient does not need any further OT services    Follow Up Recommendations  Supervision/Assistance - 24 hour    Barriers to Discharge      Equipment Recommendations  3 in 1 bedside comode;Tub/shower seat (TBD by PT)    Recommendations for Other Services    Frequency       Precautions / Restrictions Precautions Precautions: Fall;Cervical Precaution Comments: Pt given cervical precaution handout.  Educated pt on cervical precautions and pt verbalized understanding. Required Braces or Orthoses: Cervical Brace Cervical Brace: Soft collar   Pertinent Vitals/Pain See vitals    ADL  Eating/Feeding: Performed;Modified independent Where Assessed - Eating/Feeding: Chair Grooming: Performed;Wash/dry hands;Supervision/safety Where Assessed - Grooming: Unsupported standing Lower Body Bathing: Simulated;Supervision/safety Where Assessed - Lower Body Bathing: Unsupported sitting Lower Body Dressing: Performed;Supervision/safety Where Assessed - Lower Body Dressing: Unsupported sitting Toilet Transfer: Performed;Supervision/safety Toilet Transfer Method: Sit to Barista: Comfort height toilet Toileting - Clothing Manipulation and Hygiene: Performed;Supervision/safety Where Assessed - Engineer, mining and Hygiene: Standing Tub/Shower Transfer: Landscape architect Method: Ambulating Equipment Used:  (cervical collar) Transfers/Ambulation Related to  ADLs: Supervision throughout room ADL Comments: Educated on ADL techniques to assist in maintaining cervical precautions.  Pt able to cross ankles over knees to perform LB bathing/dressing.  Discussed with pt use of shower chair in tub to maximize safety and independence.  Educated pt on home safety awareness and adaptations to minimize fall risk.    OT Diagnosis:    OT Problem List:   OT Treatment Interventions:     OT Goals    Visit Information  Last OT Received On: 10/06/11 Assistance Needed: +1    Subjective Data      Prior Functioning  Vision/Perception  Home Living Lives With: Daughter Available Help at Discharge: Other (Comment);Available PRN/intermittently (ex-husband can help all the time, daughter works) Type of Home: House Home Access: Stairs to enter [employer] of Steps: 3 Entrance Stairs-Rails: Right Home Layout: Other (Comment);One level (split level) Alternate Level Stairs-Number of Steps: 3 steps to go up to bedroom Alternate Level Stairs-Rails: Right Bathroom Shower/Tub: Engineer, manufacturing systems: Handicapped height Home Adaptive Equipment: Grab bars in shower Prior Function Level of Independence: Independent Able to Take Stairs?: Yes Driving: Yes Vocation: On disability Comments: has been limited recently Communication Communication: No difficulties Dominant Hand: Right      Cognition  Overall Cognitive Status: Appears within functional limits for tasks assessed/performed Arousal/Alertness: Awake/alert Orientation Level: Appears intact for tasks assessed Behavior During Session: [facility] for tasks performed    Extremity/Trunk Assessment Right Upper Extremity Assessment RUE ROM/Strength/Tone: WFL for tasks assessed RUE Sensation: WFL - Light Touch;WFL - Proprioception RUE Coordination: WFL - gross/fine motor Left Upper Extremity Assessment LUE ROM/Strength/Tone: Within functional levels LUE Sensation: WFL - Light Touch;WFL -  Proprioception LUE Coordination: WFL - gross/fine motor   Mobility Bed Mobility Bed Mobility: Not assessed Supine to Sit: 5: Supervision;HOB elevated (70 degree) Details for Bed Mobility Assistance: cues for safety with cervical precautions Transfers Transfers: Sit to Stand;Stand to Sit Sit  to Stand: 5: Supervision;From chair/3-in-1;From toilet;With upper extremity assist Stand to Sit: 5: Supervision;To chair/3-in-1;With upper extremity assist;With armrests;To toilet Details for Transfer Assistance: superivsion for safety   Exercise    Balance Static Standing Balance Static Standing - Balance Support: No upper extremity supported Static Standing - Level of Assistance: 5: Stand by assistance  End of Session OT - End of Session Equipment Utilized During Treatment: Gait belt Activity Tolerance: Patient tolerated treatment well Patient left: in chair;with call bell/phone within reach  GO    10/06/2011 Cipriano Mile OTR/L Pager [phone] Office [phone]  Cipriano Mile 10/06/2011, 1:58 PM

## 2011-10-06 NOTE — Evaluation (Addendum)
Physical Therapy Evaluation Patient Details Name: Lindsay Bonilla MRN: [id number] DOB: 12-20-1960 Today's Date: 10/06/2011 Time:  4098-1191    PT Assessment / Plan / Recommendation Clinical Impression  [name] is 51 y/o female s/p cervical fusion. Presents with limited mobility following surgery with generalized weakness and pain. Will benefit physical therapy in the acute setting to address these and the below impairments so as to maximize functional independence and safety for d/c home. Rec HHPT and supervision for mobility.     PT Assessment  Patient needs continued PT services    Follow Up Recommendations  Home health PT;Supervision for mobility/OOB    Barriers to Discharge Decreased caregiver support      Equipment Recommendations  Other (comment) (tbd)    Recommendations for Other Services OT consult   Frequency Min 5X/week    Precautions / Restrictions Precautions Precautions: Fall;Cervical Required Braces or Orthoses: Cervical Brace Cervical Brace: Soft collar         Mobility  Bed Mobility Bed Mobility: Supine to Sit Supine to Sit: 5: Supervision;HOB elevated (70 degree) Details for Bed Mobility Assistance: cues for safety with cervical precautions Transfers Transfers: Sit to Stand;Stand to Sit Sit to Stand: 4: Min assist;With upper extremity assist;From bed Stand to Sit: 5: Supervision;With upper extremity assist;To bed;To chair/3-in-1 Details for Transfer Assistance: cues for safety,and hand placement, minA for stability upon standing Ambulation/Gait Ambulation/Gait Assistance: 4: Min guard;4: Min assist Ambulation Distance (Feet): 80 Feet Assistive device: None Ambulation/Gait Assistance Details: minA-mingaurdA for stability during gait Gait Pattern: Wide base of support;Right flexed knee in stance;Left flexed knee in stance Gait velocity: slow General Gait Details: decreased step height    Exercises     PT Diagnosis: Abnormality of gait;Generalized  weakness;Acute pain;Difficulty walking  PT Problem List: Decreased activity tolerance;Decreased balance;Decreased mobility;Decreased knowledge of use of DME;Decreased knowledge of precautions;Pain PT Treatment Interventions: DME instruction;Gait training;Stair training;Functional mobility training;Therapeutic activities;Therapeutic exercise;Patient/family education;Neuromuscular re-education;Balance training   PT Goals Acute Rehab PT Goals PT Goal Formulation: With patient Time For Goal Achievement: 10/13/11 Potential to Achieve Goals: Good Pt will Roll Supine to Right Side: with modified independence PT Goal: Rolling Supine to Right Side - Progress: Goal set today Pt will Roll Supine to Left Side: with modified independence PT Goal: Rolling Supine to Left Side - Progress: Goal set today Pt will go Supine/Side to Sit: with modified independence PT Goal: Supine/Side to Sit - Progress: Goal set today Pt will go Sit to Supine/Side: with modified independence PT Goal: Sit to Supine/Side - Progress: Goal set today Pt will go Sit to Stand: with modified independence PT Goal: Sit to Stand - Progress: Goal set today Pt will go Stand to Sit: with modified independence PT Goal: Stand to Sit - Progress: Goal set today Pt will Transfer Bed to Chair/Chair to Bed: with modified independence PT Transfer Goal: Bed to Chair/Chair to Bed - Progress: Goal set today Pt will Ambulate: >150 feet;with modified independence;with least restrictive assistive device PT Goal: Ambulate - Progress: Goal set today Pt will Go Up / Down Stairs: 3-5 stairs;with supervision;with rail(s) PT Goal: Up/Down Stairs - Progress: Goal set today Additional Goals Additional Goal #1: Pt will verbalize and demonstrate knowledge of cervical precautions with all mobility.  PT Goal: Additional Goal #1 - Progress: Goal set today  Visit Information  Last PT Received On: 10/06/11 Assistance Needed: +1    Subjective Data  Subjective:  Im hurting.  Patient Stated Goal: home   Prior Functioning  Home Living  Lives With: Daughter Available Help at Discharge: Other (Comment);Available PRN/intermittently (ex-husband can help all the time, daughter works) Type of Home: House Home Access: Stairs to enter [employer] of Steps: 3 Entrance Stairs-Rails: Right Home Layout: Other (Comment);One level (split level) Alternate Level Stairs-Number of Steps: 3 steps to go up to bedroom Alternate Level Stairs-Rails: Right Bathroom Shower/Tub: Tub/shower unit Home Adaptive Equipment: Grab bars in shower Prior Function Level of Independence: Independent Able to Take Stairs?: Yes Driving: Yes Vocation: On disability Comments: has been limited recently Communication Communication: No difficulties    Cognition  Overall Cognitive Status: Appears within functional limits for tasks assessed/performed Arousal/Alertness: Awake/alert Orientation Level: Appears intact for tasks assessed Behavior During Session: [facility] for tasks performed    Extremity/Trunk Assessment Right Upper Extremity Assessment RUE ROM/Strength/Tone: WFL for tasks assessed RUE Sensation: WFL - Light Touch RUE Coordination: WFL - gross/fine motor Left Upper Extremity Assessment LUE ROM/Strength/Tone: WFL for tasks assessed LUE Sensation: WFL - Light Touch LUE Coordination: WFL - gross/fine motor Right Lower Extremity Assessment RLE ROM/Strength/Tone: WFL for tasks assessed RLE Sensation: WFL - Light Touch RLE Coordination: WFL - gross/fine motor Left Lower Extremity Assessment LLE ROM/Strength/Tone: WFL for tasks assessed LLE Sensation: WFL - Light Touch LLE Coordination: WFL - gross/fine motor Trunk Assessment Trunk Assessment: Kyphotic   Balance Static Standing Balance Static Standing - Balance Support: No upper extremity supported Static Standing - Level of Assistance: 5: Stand by assistance  End of Session PT - End of Session Equipment  Utilized During Treatment: Gait belt Activity Tolerance: Patient tolerated treatment well Patient left: in chair;with call bell/phone within reach Nurse Communication: Mobility status  GP     [facility] HELEN 10/06/2011, 10:53 AM

## 2011-10-06 NOTE — Progress Notes (Signed)
INITIAL ADULT NUTRITION ASSESSMENT Date: 10/06/2011   Time: 12:16 PM Reason for Assessment: Nutrition Risk - weight loss  INTERVENTION: Ensure Complete po BID, each supplement provides 350 kcal and 13 grams of protein. Encouraged supplement intake at home if po intake is poor at a meal, pt agreeable.    ASSESSMENT: Female 51 y.o.  Dx: Neck Pain  Hx:  Past Medical History  Diagnosis Date  . Palpitations   . 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 Aortic stenosis, severe     Bicuspid valve  . Valvular heart disease   . Migraine headache   . Bronchitis   . COPD (chronic obstructive pulmonary disease)   . CHF (congestive heart failure)   . Asthma   . Aortic aneurysm   . Dysrhythmia     palpitations  . Anxiety   . GERD (gastroesophageal reflux disease)   . Arthritis   . Shortness of breath     Nodule left lung CT done 8/6   Past Surgical History  Procedure Date  . Sigmoidoscopy 10/13/05, 09/19/05  . Colonoscopy 10/13/05  . Aortic valve replacement   . Cholecystectomy   . Abdominal hysterectomy   . Knee arthroscopy     rt x4  x1 lft  . Tonsillectomy   . Tubal ligation   . Arthroscopic repair acl rt  . Diagnostic laparoscopy   . Tympanostomy tube placement   . Myringoplasty w/ fat graft     graft from behind ear    Related Meds:     . atorvastatin  10 mg Oral q1800  . benzonatate  100 mg Oral TID  .  ceFAZolin (ANCEF) IV  1 g Intravenous Q8H  . dexamethasone  4 mg Intravenous Q6H   Or  . dexamethasone  4 mg Oral Q6H  . furosemide  40 mg Oral BID  . gabapentin  300 mg Oral TID  . HYDROmorphone      . HYDROmorphone      . loratadine  10 mg Oral Daily  . lubiprostone  24 mcg Oral BID WC  . montelukast  10 mg Oral QHS  . pantoprazole  40 mg Oral Daily  . pneumococcal 23 valent vaccine  0.5 mL Intramuscular Tomorrow-1000  . potassium chloride SA  20 mEq Oral Daily  . sodium chloride  3 mL Intravenous Q12H   Ht: 5\' 3"  (160  cm)  Wt: 150 lb (68.04 kg)  Ideal Wt: 52.2 kg % Ideal Wt: 130%  Usual Wt:  Wt Readings from Last 10 Encounters:  10/05/11 150 lb (68.04 kg)  10/05/11 150 lb (68.04 kg)  09/30/11 151 lb 8 oz (68.72 kg)  08/31/11 144 lb (65.318 kg)  07/14/11 143 lb (64.864 kg)  07/09/11 143 lb (64.864 kg)  03/15/11 156 lb (70.761 kg)  09/20/10 159 lb (72.122 kg)  05/13/10 166 lb 12 oz (75.637 kg)  02/12/10 164 lb (74.39 kg)   % Usual Wt: 90%  Body mass index is 26.57 kg/(m^2).  Food/Nutrition Related Hx: Pt reports that she has had a 10% wt loss over the last 17 months. She states that her weight loss is likely due to variable intake which is dependent on her pain level.   Labs:  CMP     Component Value Date/Time   NA 138 09/30/2011 1411   K 4.0 09/30/2011 1411   CL 101 09/30/2011 1411   CO2 30 09/30/2011 1411   GLUCOSE 100* 09/30/2011 1411   BUN  14 09/30/2011 1411   CREATININE 0.78 09/30/2011 1411   CALCIUM 8.9 09/30/2011 1411   PROT 6.7 08/08/2007 0933   ALBUMIN 3.8 08/08/2007 0933   AST 19 08/08/2007 0933   ALT 15 08/08/2007 0933   ALKPHOS 59 08/08/2007 0933   BILITOT 0.7 08/08/2007 0933   GFRNONAA >90 09/30/2011 1411   GFRAA >90 09/30/2011 1411  CBG (last 3)  No results found for this basename: GLUCAP:3 in the last 72 hours Lipid Panel     Component Value Date/Time   CHOL 193 06/01/2011 1330   TRIG 129 06/01/2011 1330   HDL 51 06/01/2011 1330   CHOLHDL 3.8 06/01/2011 1330   VLDL 26 06/01/2011 1330   LDLCALC 116* 06/01/2011 1330    Intake/Output Summary (Last 24 hours) at 10/06/11 1218 Last data filed at 10/06/11 0319  Gross per 24 hour  Intake    100 ml  Output      0 ml  Net    100 ml     Diet Order: General  Supplements/Tube Feeding: none  IVF:    sodium chloride Last Rate: 1 mL/hr at 10/05/11 1425  sodium chloride Last Rate: 75 mL/hr at 10/05/11 1832    Estimated Nutritional Needs:   Kcal:  1700-1900 Protein:  80-90 grams Fluid:  >1.7 L/day  NUTRITION DIAGNOSIS: -Inadequate oral  intake (NI-2.1).  Status: Ongoing  RELATED TO: pain  AS EVIDENCE BY: weight loss PTA  MONITORING/EVALUATION(Goals): Goal: Pt to meet >/= 90% of their estimated nutrition needs. Monitor: PO intake, weight  EDUCATION NEEDS: -Education needs addressed.    DOCUMENTATION CODES Per approved criteria  -Not Applicable    Kendell Bane RD, LDN, CNSC [phone] Pager [phone] After Hours Pager  10/06/2011, 12:16 PM

## 2011-10-07 ENCOUNTER — Encounter (HOSPITAL_COMMUNITY): Payer: Self-pay | Admitting: Neurosurgery

## 2011-10-07 NOTE — Progress Notes (Signed)
Physical Therapy Treatment Patient Details Name: Lindsay Bonilla MRN: [id number] DOB: [birth year] Today's Date: 10/07/2011 Time: 0752-0806 PT Time Calculation (min): 14 min  PT Assessment / Plan / Recommendation Comments on Treatment Session  Pt's gait stability improved over yesterday's session and pt reports feeling more stable. Pt's ambulation distance improved and pt encouraged to ambulate with nursing 2-3 times today.    Follow Up Recommendations  No PT follow up;Supervision for mobility/OOB    Barriers to Discharge        Equipment Recommendations  None recommended by PT (defer to OT)    Recommendations for Other Services    Frequency Min 5X/week   Plan Discharge plan needs to be updated    Precautions / Restrictions Precautions Precautions: Cervical Precaution Comments: Pt demonstrated cervical precautions during functional mobility. Required Braces or Orthoses: Cervical Brace Cervical Brace: Soft collar       Mobility  Bed Mobility Bed Mobility: Sitting - Scoot to Edge of Bed Sitting - Scoot to Edge of Bed: 6: Modified independent (Device/Increase time) Details for Bed Mobility Assistance: Pt sitting up in bed upon presentation and was modified independent with scooting to EOB. Transfers Transfers: Sit to Stand;Stand to Sit Sit to Stand: From bed;With upper extremity assist;5: Supervision Stand to Sit: To chair/3-in-1;With upper extremity assist;5: Supervision Details for Transfer Assistance: required supervision for safety during transfers. Ambulation/Gait Ambulation/Gait Assistance: 5: Supervision Ambulation Distance (Feet): 400 Feet Assistive device: None Ambulation/Gait Assistance Details: Pt more stable during gait this session, requiring supervision for safety. Gait Pattern: Step-through pattern;Right flexed knee in stance;Left flexed knee in stance Stairs: Yes Stairs Assistance: 5: Supervision Stairs Assistance Details (indicate cue type and reason): Pt  required supervision for safety and verbal cues for step to pattern. Stair Management Technique: One rail Right Number of Stairs: 6       PT Goals Acute Rehab PT Goals PT Goal: Sit to Stand - Progress: Progressing toward goal PT Goal: Stand to Sit - Progress: Progressing toward goal PT Goal: Ambulate - Progress: Progressing toward goal PT Goal: Up/Down Stairs - Progress: Met Additional Goals PT Goal: Additional Goal #1 - Progress: Progressing toward goal  Visit Information  Last PT Received On: 10/07/11 Assistance Needed: +1    Subjective Data  Subjective: "I am feeling much better."   Cognition  Overall Cognitive Status: Appears within functional limits for tasks assessed/performed Arousal/Alertness: Awake/alert Orientation Level: Appears intact for tasks assessed Behavior During Session: [facility] for tasks performed    Balance     End of Session PT - End of Session Equipment Utilized During Treatment: Gait belt Activity Tolerance: Patient tolerated treatment well Patient left: in chair;with call bell/phone within reach   GP     Lindsay Bonilla 10/07/2011, 8:20 AM

## 2011-10-07 NOTE — Progress Notes (Signed)
Clinical Social Worker received referral for possible placement needs. CSW reviewed chart and patient is to be discharge home with home health services. CSW to sign off.   Rozetta Nunnery MSW, LCSWA ([address] [place]) [phone]

## 2011-10-07 NOTE — Discharge Summary (Signed)
Physician Discharge Summary  Patient ID: Lindsay Bonilla MRN: [id number] DOB/AGE: January 10, 1961 51 y.o.  Admit date: 10/05/2011 Discharge date: 10/07/2011  Admission Diagnoses: Cervical HNP    Discharge Diagnoses: Cervical HNP   Discharged Condition: good  Hospital Course: Patient was admitted by Dr. Hilda Lias who performed a 2 level CV-6 and C6-7 ACDF. Patient had moderate pain postoperatively which is diminished. She is up and ambulating, and is asking to be discharged. She's been given instructions regarding wound care and activities. A 3 in one was recommended by the physical therapist, and has been ordered. She is to followup with Dr. turned the office. Dressing was removed and wound is clean and dry.   Discharge Exam: Blood pressure 130/81, pulse 81, temperature 98 F (36.7 C), temperature source Oral, resp. rate 18, height 5\' 3"  (1.6 m), weight 68.04 kg (150 lb), SpO2 96.00%.   Disposition: Home    Medication List  As of 10/07/2011  8:22 AM   TAKE these medications         ALPRAZolam 1 MG tablet   Commonly known as: XANAX   Take 1 mg by mouth 4 (four) times daily as needed. For anxiety      AMITIZA 24 MCG capsule   Generic drug: lubiprostone   Take 24 mcg by mouth 2 (two) times daily with a meal.      amoxicillin 500 MG capsule   Commonly known as: AMOXIL   Take 500 mg by mouth once. For dental appt.      azelastine 0.05 % ophthalmic solution   Commonly known as: OPTIVAR   Place 1 drop into both eyes 2 (two) times daily as needed. For allergies      benzonatate 100 MG capsule   Commonly known as: TESSALON   Take 200 mg by mouth 3 (three) times daily as needed. For cough      cetirizine 10 MG tablet   Commonly known as: ZYRTEC   Take 10 mg by mouth daily.      cyclobenzaprine 10 MG tablet   Commonly known as: FLEXERIL   Take 20 mg by mouth at bedtime.      EPIPEN 0.3 mg/0.3 mL Devi   Generic drug: EPINEPHrine   Inject 0.3 mg into the muscle as needed. For  allergic reaction      esomeprazole 40 MG capsule   Commonly known as: NEXIUM   Take 40 mg by mouth 2 (two) times daily.      furosemide 40 MG tablet   Commonly known as: LASIX   Take 40 mg by mouth 2 (two) times daily.      gabapentin 300 MG capsule   Commonly known as: NEURONTIN   Take 300 mg by mouth 3 (three) times daily.      hydrOXYzine 10 MG tablet   Commonly known as: ATARAX/VISTARIL   Take 10 mg by mouth every 6 (six) hours as needed. For itching from allergies      K-DUR 20 MEQ tablet   Generic drug: potassium chloride SA   TAKE (1) TABLET TWICE DAILY.      montelukast 10 MG tablet   Commonly known as: SINGULAIR   Take 10 mg by mouth daily.      oxyCODONE-acetaminophen 10-325 MG per tablet   Commonly known as: PERCOCET   Take 1 tablet by mouth 5 (five) times daily. As needed for pain      oxymorphone 20 MG 12 hr tablet   Commonly known as: OPANA  ER   Take 20 mg by mouth 2 (two) times daily.      rosuvastatin 10 MG tablet   Commonly known as: CRESTOR   Take 1 tablet (10 mg total) by mouth daily.      SUMAtriptan 100 MG tablet   Commonly known as: IMITREX   Take 100 mg by mouth as needed. For migraines      VENTOLIN HFA 108 (90 BASE) MCG/ACT inhaler   Generic drug: albuterol   Inhale 1 puff into the lungs 4 (four) times daily as needed. For shortness of breath             Signed: Hewitt Shorts, MD 10/07/2011, 8:22 AM

## 2011-10-07 NOTE — Progress Notes (Signed)
I have read and agree with below treatment note and plan.  Strider Vallance Helen Whitlow PT, DPT Pager: [phone] 

## 2011-11-30 ENCOUNTER — Other Ambulatory Visit: Payer: Self-pay | Admitting: Cardiology

## 2012-01-24 ENCOUNTER — Other Ambulatory Visit: Payer: Self-pay | Admitting: *Deleted

## 2012-01-24 MED ORDER — FUROSEMIDE 40 MG PO TABS: 40.0000 mg | ORAL_TABLET | Freq: Every day | ORAL | Status: DC

## 2012-03-01 ENCOUNTER — Other Ambulatory Visit: Payer: Self-pay

## 2012-03-01 DIAGNOSIS — J449 Chronic obstructive pulmonary disease, unspecified: Secondary | ICD-10-CM

## 2012-03-08 ENCOUNTER — Ambulatory Visit (HOSPITAL_COMMUNITY): Payer: Medicaid Other

## 2012-03-15 ENCOUNTER — Ambulatory Visit (HOSPITAL_COMMUNITY)
Admission: RE | Admit: 2012-03-15 | Discharge: 2012-03-15 | Disposition: A | Discharge status: Home / Self Care | Payer: Medicaid Other | Source: Ambulatory Visit | Attending: Pulmonary Disease | Admitting: Pulmonary Disease

## 2012-03-15 DIAGNOSIS — J4489 Other specified chronic obstructive pulmonary disease: Secondary | ICD-10-CM | POA: Insufficient documentation

## 2012-03-15 DIAGNOSIS — J449 Chronic obstructive pulmonary disease, unspecified: Secondary | ICD-10-CM | POA: Insufficient documentation

## 2012-03-15 LAB — BLOOD GAS, ARTERIAL
Patient temperature: 37
pCO2 arterial: 38.9 mmHg (ref 35.0–45.0)
pH, Arterial: 7.389 (ref 7.350–7.450)

## 2012-03-15 MED ORDER — ALBUTEROL SULFATE (5 MG/ML) 0.5% IN NEBU
2.5000 mg | INHALATION_SOLUTION | Freq: Once | RESPIRATORY_TRACT | Status: AC
  Administered 2012-03-15: 2.5 mg via RESPIRATORY_TRACT

## 2012-03-19 ENCOUNTER — Ambulatory Visit: Payer: Medicaid Other | Admitting: Cardiology

## 2012-03-29 NOTE — Procedures (Signed)
NAMEERCIA, CRISAFULLI                ACCOUNT NO.:  [id number]  MEDICAL RECORD NO.:  [id number]  LOCATION:                                 FACILITY:  PHYSICIAN:  Jaceyon Strole L. Juanetta Gosling, M.D.DATE OF BIRTH:  04-30-1960  DATE OF PROCEDURE:  03/28/2012 DATE OF DISCHARGE:                           PULMONARY FUNCTION TEST   PULMONARY FUNCTION TEST:  Reason for pulmonary function testing is COPD.  1. Spirometry shows a severe ventilatory defect with evidence of     airflow obstruction. 2. Lung volumes show severe air trapping. 3. DLCO is mildly reduced. 4. Airway resistance is elevated confirming the presence of airflow     obstruction. 5. Arterial blood gas shows mild hypoxemia. 6. There is significant bronchodilator improvement.     Marianita Botkin L. Juanetta Gosling, M.D.     [facility]/MEDQ  D:  03/28/2012  T:  03/28/2012  Job:  [id number]

## 2012-04-03 ENCOUNTER — Other Ambulatory Visit (HOSPITAL_COMMUNITY): Payer: Self-pay | Admitting: Pulmonary Disease

## 2012-04-03 DIAGNOSIS — M545 Low back pain, unspecified: Secondary | ICD-10-CM

## 2012-04-03 DIAGNOSIS — R911 Solitary pulmonary nodule: Secondary | ICD-10-CM

## 2012-04-05 ENCOUNTER — Ambulatory Visit (HOSPITAL_COMMUNITY)
Admission: RE | Admit: 2012-04-05 | Discharge: 2012-04-05 | Disposition: A | Discharge status: Home / Self Care | Payer: Medicaid Other | Source: Ambulatory Visit | Attending: Pulmonary Disease | Admitting: Pulmonary Disease

## 2012-04-05 ENCOUNTER — Encounter (HOSPITAL_COMMUNITY): Payer: Self-pay

## 2012-04-05 ENCOUNTER — Ambulatory Visit (HOSPITAL_COMMUNITY): Admission: RE | Admit: 2012-04-05 | Payer: Medicaid Other | Source: Ambulatory Visit

## 2012-04-05 DIAGNOSIS — M5126 Other intervertebral disc displacement, lumbar region: Secondary | ICD-10-CM | POA: Insufficient documentation

## 2012-04-05 DIAGNOSIS — M545 Low back pain, unspecified: Secondary | ICD-10-CM | POA: Insufficient documentation

## 2012-04-05 LAB — PULMONARY FUNCTION TEST

## 2012-07-18 ENCOUNTER — Other Ambulatory Visit: Payer: Self-pay | Admitting: Cardiology

## 2012-07-29 ENCOUNTER — Other Ambulatory Visit: Payer: Self-pay | Admitting: Cardiology

## 2012-08-06 ENCOUNTER — Telehealth: Payer: Self-pay | Admitting: Cardiology

## 2012-08-06 NOTE — Telephone Encounter (Signed)
Lindsay Bonilla from [facility] requested records on 08/03/2012  asw  Faxed 14 pages; last Office Note, ekg, echo, and labs on 08/03/2012  asw

## 2012-08-23 ENCOUNTER — Telehealth: Payer: Self-pay | Admitting: Cardiology

## 2012-08-23 NOTE — Telephone Encounter (Signed)
Pt will see Dr. Daleen Squibb first.

## 2012-08-23 NOTE — Telephone Encounter (Signed)
New Problem  Pt has an upcoming appt with Dr Daleen Squibb and she said she usually has an echo before seeing him. She wants to know does she need to have one before this appt.

## 2012-08-29 ENCOUNTER — Ambulatory Visit (INDEPENDENT_AMBULATORY_CARE_PROVIDER_SITE_OTHER): Payer: Medicaid Other | Admitting: Cardiology

## 2012-08-29 ENCOUNTER — Encounter: Payer: Self-pay | Admitting: Cardiology

## 2012-08-29 VITALS — BP 114/72 | HR 80 | Ht 63.0 in | Wt 163.0 lb

## 2012-08-29 DIAGNOSIS — I712 Thoracic aortic aneurysm, without rupture, unspecified: Secondary | ICD-10-CM

## 2012-08-29 DIAGNOSIS — R0609 Other forms of dyspnea: Secondary | ICD-10-CM

## 2012-08-29 DIAGNOSIS — R0989 Other specified symptoms and signs involving the circulatory and respiratory systems: Secondary | ICD-10-CM

## 2012-08-29 DIAGNOSIS — F17200 Nicotine dependence, unspecified, uncomplicated: Secondary | ICD-10-CM

## 2012-08-29 DIAGNOSIS — I2510 Atherosclerotic heart disease of native coronary artery without angina pectoris: Secondary | ICD-10-CM

## 2012-08-29 DIAGNOSIS — I38 Endocarditis, valve unspecified: Secondary | ICD-10-CM

## 2012-08-29 NOTE — Assessment & Plan Note (Signed)
Status post Ross procedure. Stable. We'll schedule followup with Dr.McAlhaney in one year.

## 2012-08-29 NOTE — Assessment & Plan Note (Signed)
With a lung nodule which needs followup with a CT scan, and moderate severe emphysema, wearing oxygen at night, she has to quit. I have spent a long time encouraging her today.

## 2012-08-29 NOTE — Assessment & Plan Note (Signed)
We'll repeat CT with contrast to assess this. He has never been called an aneurysm but moderate dilatation.

## 2012-08-29 NOTE — Assessment & Plan Note (Signed)
I made her aware that her CT scan showed calcification aorta and she most likely has some coronary artery disease.

## 2012-08-29 NOTE — Patient Instructions (Addendum)
Non-Cardiac CT of the chest scanning at [facility], (CAT scanning), is a noninvasive, special x-ray that produces cross-sectional images of the body using x-rays and a computer. CT scans help physicians diagnose and treat medical conditions. For some CT exams, a contrast material is used to enhance visibility in the area of the body being studied. CT scans provide greater clarity and reveal more details than regular x-ray exams.  Your physician recommends that you continue on your current medications as directed. Please refer to the Current Medication list given to you today.  Your physician wants you to follow-up in: 1 year with Dr. Verne Carrow.  You will receive a reminder letter in the mail two months in advance. If you don't receive a letter, please call our office to schedule the follow-up appointment.  Your physician discussed the hazards of tobacco use. Tobacco use cessation is recommended and techniques and options to help you quit were discussed.

## 2012-08-29 NOTE — Assessment & Plan Note (Signed)
Secondary to COPD

## 2012-08-29 NOTE — Progress Notes (Signed)
HPI Lindsay Bonilla comes in today for evaluation and management of her history of a bicuspid aortic valve, Ross procedure, history of a descending aortic root dilatation, minimal aortic insufficiency, severe COPD, and cardiac risk factors.  She has dyspnea on exertion which is unchanged. She's always had atypical chest pain. CT scan last August showed moderate to severe emphysema with a left upper lobe nodule. No mention was made of her aorta but it was a noncontrast study. She did have calcification aorta and coronary arteries.  Most recent echo was a year ago and was stable. She has good left ventricular function.  Past Medical History  Diagnosis Date  . Palpitations   . 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 Aortic stenosis, severe     Bicuspid valve  . Valvular heart disease   . Migraine headache   . Bronchitis   . COPD (chronic obstructive pulmonary disease)   . CHF (congestive heart failure)   . Asthma   . Aortic aneurysm   . Dysrhythmia     palpitations  . Anxiety   . GERD (gastroesophageal reflux disease)   . Arthritis   . Shortness of breath     Nodule left lung CT done 8/6    Current Outpatient Prescriptions  Medication Sig Dispense Refill  . albuterol (VENTOLIN HFA) 108 (90 BASE) MCG/ACT inhaler Inhale 1 puff into the lungs 4 (four) times daily as needed. For shortness of breath      . ALPRAZolam (XANAX) 1 MG tablet Take 1 mg by mouth 4 (four) times daily as needed. For anxiety      . amoxicillin (AMOXIL) 500 MG capsule Take 500 mg by mouth once. For dental appt.      Marland Kitchen azelastine (OPTIVAR) 0.05 % ophthalmic solution Place 1 drop into both eyes 2 (two) times daily as needed. For allergies      . benzonatate (TESSALON) 100 MG capsule Take 200 mg by mouth 3 (three) times daily as needed. For cough      . Budesonide-Formoterol Fumarate (SYMBICORT IN) Inhale into the lungs as directed.      . cetirizine (ZYRTEC) 10 MG tablet Take 10 mg by mouth daily.         . CRESTOR 10 MG tablet TAKE (1) TABLET BY MOUTH ONCE DAILY.  30 tablet  2  . cyclobenzaprine (FLEXERIL) 10 MG tablet Take 20 mg by mouth at bedtime.        Marland Kitchen EPINEPHrine (EPIPEN) 0.3 mg/0.3 mL DEVI Inject 0.3 mg into the muscle as needed. For allergic reaction      . esomeprazole (NEXIUM) 40 MG capsule Take 40 mg by mouth 2 (two) times daily.        Marland Kitchen gabapentin (NEURONTIN) 300 MG capsule Take 300 mg by mouth 3 (three) times daily.      . hydrOXYzine (ATARAX) 10 MG tablet Take 10 mg by mouth every 6 (six) hours as needed. For itching from allergies      . K-DUR 20 MEQ tablet TAKE (1) TABLET TWICE DAILY.  60 each  11  . lubiprostone (AMITIZA) 24 MCG capsule Take 24 mcg by mouth 2 (two) times daily with a meal.      . montelukast (SINGULAIR) 10 MG tablet Take 10 mg by mouth daily.        Marland Kitchen oxyCODONE-acetaminophen (PERCOCET) 10-325 MG per tablet Take 1 tablet by mouth 5 (five) times daily. As needed for pain      .  oxymorphone (OPANA ER) 20 MG 12 hr tablet Take 20 mg by mouth 2 (two) times daily.        . SUMAtriptan (IMITREX) 100 MG tablet Take 100 mg by mouth as needed. For migraines      . furosemide (LASIX) 40 MG tablet TAKE (1) TABLET TWICE DAILY.  60 tablet  5   No current facility-administered medications for this visit.    Allergies  Allergen Reactions  . Beta Adrenergic Blockers Anaphylaxis  . Peanut-Containing Drug Products Shortness Of Breath and Swelling    Swelling of the throat  . Shrimp (Shellfish Allergy) Anaphylaxis  . Avelox (Moxifloxacin Hcl In Nacl) Rash  . Ciprofloxacin Nausea And Vomiting  . Cleocin (Clindamycin Hcl) Rash  . Moxifloxacin Rash  . Sulfamethoxazole W-Trimethoprim Rash  . Tape     White clear itches, rash    Family History  Problem Relation Age of Onset  . Cancer Mother     Type unknown  . Heart attack Father   . Diabetes      family history  . Asthma Brother   . Hyperlipidemia      family history    History   Social History  .  Marital Status: Legally Separated    Spouse Name: N/A    Number of Children: N/A  . Years of Education: N/A   Occupational History  . Not on file.   Social History Main Topics  . 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 Smokeless tobacco: Not on file  . Alcohol Use: Yes  . Drug Use: No  . Sexually Active: No   Other Topics Concern  . Not on file   Social History Narrative  . No narrative on file    ROS ALL NEGATIVE EXCEPT THOSE NOTED IN HPI  PE  General Appearance: well developed, well nourished in no acute distress, looks older than stated age. HEENT: symmetrical face, PERRLA, good dentition  Neck: no JVD, thyromegaly, or adenopathy, trachea midline Chest: symmetric without deformity Cardiac: PMI non-displaced, RRR, normal S1, S2, no gallop or murmur Lung: Decreased breath sounds throughout Vascular: all pulses full without bruits  Abdominal: nondistended, nontender, good bowel sounds, no HSM, no bruits Extremities: no cyanosis, clubbing, minimal edema, no sign of DVT, no varicosities  Skin: normal color, no rashes Neuro: alert and oriented x 3, non-focal Pysch: normal affect, emotional  EKG Normal sinus rhythm, biatrial enlargement, rightward axis, pulmonary disease pattern BMET    Component Value Date/Time   NA 138 09/30/2011 1411   K 4.0 09/30/2011 1411   CL 101 09/30/2011 1411   CO2 30 09/30/2011 1411   GLUCOSE 100* 09/30/2011 1411   BUN 14 09/30/2011 1411   CREATININE 0.78 09/30/2011 1411   CALCIUM 8.9 09/30/2011 1411   GFRNONAA >90 09/30/2011 1411   GFRAA >90 09/30/2011 1411    Lipid Panel     Component Value Date/Time   CHOL 193 06/01/2011 1330   TRIG 129 06/01/2011 1330   HDL 51 06/01/2011 1330   CHOLHDL 3.8 06/01/2011 1330   VLDL 26 06/01/2011 1330   LDLCALC 116* 06/01/2011 1330    CBC    Component Value Date/Time   WBC 9.7 09/30/2011 1411   RBC 4.98 09/30/2011 1411   HGB 15.0 09/30/2011 1411   HCT 44.1 09/30/2011 1411    PLT 256 09/30/2011 1411   MCV 88.6 09/30/2011 1411   MCH 30.1 09/30/2011 1411  MCHC 34.0 09/30/2011 1411   RDW 13.9 09/30/2011 1411   LYMPHSABS 3.4 07/15/2011 0041   MONOABS 0.6 07/15/2011 0041   EOSABS 0.2 07/15/2011 0041   BASOSABS 0.0 07/15/2011 0041

## 2012-09-05 ENCOUNTER — Ambulatory Visit (HOSPITAL_COMMUNITY)
Admission: RE | Admit: 2012-09-05 | Discharge: 2012-09-05 | Disposition: A | Discharge status: Home / Self Care | Payer: Medicaid Other | Source: Ambulatory Visit | Attending: Cardiology | Admitting: Cardiology

## 2012-09-05 DIAGNOSIS — I712 Thoracic aortic aneurysm, without rupture, unspecified: Secondary | ICD-10-CM | POA: Insufficient documentation

## 2012-09-05 DIAGNOSIS — R911 Solitary pulmonary nodule: Secondary | ICD-10-CM | POA: Insufficient documentation

## 2012-09-05 DIAGNOSIS — J438 Other emphysema: Secondary | ICD-10-CM | POA: Insufficient documentation

## 2012-09-05 DIAGNOSIS — Z954 Presence of other heart-valve replacement: Secondary | ICD-10-CM | POA: Insufficient documentation

## 2012-09-05 MED ORDER — IOHEXOL 350 MG/ML SOLN
100.0000 mL | Freq: Once | INTRAVENOUS | Status: AC | PRN
  Administered 2012-09-05: 100 mL via INTRAVENOUS

## 2012-10-08 ENCOUNTER — Other Ambulatory Visit: Payer: Self-pay | Admitting: *Deleted

## 2012-10-08 ENCOUNTER — Other Ambulatory Visit: Payer: Self-pay | Admitting: Cardiology

## 2012-10-08 MED ORDER — POTASSIUM CHLORIDE ER 20 MEQ PO TBCR: 20.0000 meq | EXTENDED_RELEASE_TABLET | Freq: Two times a day (BID) | ORAL | Status: DC

## 2012-11-22 ENCOUNTER — Telehealth: Payer: Self-pay | Admitting: *Deleted

## 2012-11-22 NOTE — Telephone Encounter (Signed)
Approval from [facility] Medicaid Tracks for Crestor 10mg   through 11/22/2013, [facility] notified, they run through computer for $3.00 and will notify patient.

## 2013-01-30 ENCOUNTER — Other Ambulatory Visit: Payer: Self-pay | Admitting: Cardiology

## 2013-05-04 ENCOUNTER — Emergency Department (HOSPITAL_COMMUNITY): Payer: Medicaid Other

## 2013-05-04 ENCOUNTER — Encounter (HOSPITAL_COMMUNITY): Payer: Self-pay | Admitting: Emergency Medicine

## 2013-05-04 ENCOUNTER — Emergency Department (HOSPITAL_COMMUNITY)
Admission: EM | Admit: 2013-05-04 | Discharge: 2013-05-05 | Disposition: A | Discharge status: Home / Self Care | Payer: Medicaid Other | Attending: Emergency Medicine | Admitting: Emergency Medicine

## 2013-05-04 DIAGNOSIS — J441 Chronic obstructive pulmonary disease with (acute) exacerbation: Secondary | ICD-10-CM

## 2013-05-04 DIAGNOSIS — G43909 Migraine, unspecified, not intractable, without status migrainosus: Secondary | ICD-10-CM | POA: Insufficient documentation

## 2013-05-04 DIAGNOSIS — F411 Generalized anxiety disorder: Secondary | ICD-10-CM | POA: Insufficient documentation

## 2013-05-04 DIAGNOSIS — M129 Arthropathy, unspecified: Secondary | ICD-10-CM | POA: Insufficient documentation

## 2013-05-04 DIAGNOSIS — IMO0002 Reserved for concepts with insufficient information to code with codable children: Secondary | ICD-10-CM | POA: Insufficient documentation

## 2013-05-04 DIAGNOSIS — I509 Heart failure, unspecified: Secondary | ICD-10-CM | POA: Insufficient documentation

## 2013-05-04 DIAGNOSIS — F17200 Nicotine dependence, unspecified, uncomplicated: Secondary | ICD-10-CM | POA: Insufficient documentation

## 2013-05-04 DIAGNOSIS — K219 Gastro-esophageal reflux disease without esophagitis: Secondary | ICD-10-CM | POA: Insufficient documentation

## 2013-05-04 DIAGNOSIS — Z79899 Other long term (current) drug therapy: Secondary | ICD-10-CM | POA: Insufficient documentation

## 2013-05-04 DIAGNOSIS — Z792 Long term (current) use of antibiotics: Secondary | ICD-10-CM | POA: Insufficient documentation

## 2013-05-04 LAB — CBC WITH DIFFERENTIAL/PLATELET
Basophils Absolute: 0 10*3/uL (ref 0.0–0.1)
Basophils Relative: 0 % (ref 0–1)
EOS ABS: 0.1 10*3/uL (ref 0.0–0.7)
EOS PCT: 2 % (ref 0–5)
HEMATOCRIT: 40.5 % (ref 36.0–46.0)
HEMOGLOBIN: 13.8 g/dL (ref 12.0–15.0)
LYMPHS ABS: 2.4 10*3/uL (ref 0.7–4.0)
Lymphocytes Relative: 47 % — ABNORMAL HIGH (ref 12–46)
MCH: 30.1 pg (ref 26.0–34.0)
MCHC: 34.1 g/dL (ref 30.0–36.0)
MCV: 88.4 fL (ref 78.0–100.0)
MONOS PCT: 9 % (ref 3–12)
Monocytes Absolute: 0.5 10*3/uL (ref 0.1–1.0)
Neutro Abs: 2.2 10*3/uL (ref 1.7–7.7)
Neutrophils Relative %: 42 % — ABNORMAL LOW (ref 43–77)
Platelets: 197 10*3/uL (ref 150–400)
RBC: 4.58 MIL/uL (ref 3.87–5.11)
RDW: 12.5 % (ref 11.5–15.5)
WBC: 5.1 10*3/uL (ref 4.0–10.5)

## 2013-05-04 LAB — BASIC METABOLIC PANEL
BUN: 23 mg/dL (ref 6–23)
CALCIUM: 8.8 mg/dL (ref 8.4–10.5)
CO2: 25 mEq/L (ref 19–32)
Chloride: 102 mEq/L (ref 96–112)
Creatinine, Ser: 1.06 mg/dL (ref 0.50–1.10)
GFR calc Af Amer: 69 mL/min — ABNORMAL LOW (ref >90)
GFR, EST NON AFRICAN AMERICAN: 60 mL/min — AB (ref >90)
GLUCOSE: 103 mg/dL — AB (ref 70–99)
POTASSIUM: 4 meq/L (ref 3.7–5.3)
Sodium: 141 mEq/L (ref 137–147)

## 2013-05-04 MED ORDER — CLARITHROMYCIN 500 MG PO TABS
500.0000 mg | ORAL_TABLET | Freq: Once | ORAL | Status: AC
  Administered 2013-05-04: 500 mg via ORAL
  Filled 2013-05-04: qty 1

## 2013-05-04 MED ORDER — ALBUTEROL SULFATE (2.5 MG/3ML) 0.083% IN NEBU
2.5000 mg | INHALATION_SOLUTION | Freq: Once | RESPIRATORY_TRACT | Status: AC
  Administered 2013-05-04: 2.5 mg via RESPIRATORY_TRACT
  Filled 2013-05-04: qty 3

## 2013-05-04 MED ORDER — PREDNISONE 50 MG PO TABS
60.0000 mg | ORAL_TABLET | Freq: Once | ORAL | Status: AC
  Administered 2013-05-04: 60 mg via ORAL
  Filled 2013-05-04 (×2): qty 1

## 2013-05-04 MED ORDER — HYDROCOD POLST-CHLORPHEN POLST 10-8 MG/5ML PO LQCR
5.0000 mL | Freq: Once | ORAL | Status: AC
  Administered 2013-05-04: 5 mL via ORAL
  Filled 2013-05-04: qty 5

## 2013-05-04 NOTE — ED Notes (Signed)
Cough, SOB x 1 week w/myalgias.  Has taken Tamiflu full dosing and Keflex since speaking w/Dr. Luan Pulling on Monday.  Symptoms have not gotten any better.

## 2013-05-04 NOTE — ED Provider Notes (Signed)
CSN: [id number]     Arrival date & time 05/04/13  2058 History   First MD Initiated Contact with Patient 05/04/13 2218     Chief Complaint  Patient presents with  . Cough  . Shortness of Breath     (Consider location/radiation/quality/duration/timing/severity/associated sxs/prior Treatment) HPI Comments: Patient is a 53 year old female with a history of chronic obstructive pulmonary disease who presents to the emergency department with one week of cough, shortness of breath, body aches, and generally not feeling well. The patient states that earlier during the week she called her doctor's office and was sent Keflex and Tamiflu. She had mentioned to the doctor's office personnel that she had brownish-looking phlegm present. The patient is not aware of any high fevers reported. She does note however that her shortness of breath seems to be getting progressively worse. She usually only uses oxygen at night, and over the last few days has had to use oxygen all day. She has albuterol nebulizers at home, and has had to use them on a more frequent basis than usual. No hemoptysis reported.  Patient is a 53 y.o. female presenting with cough and shortness of breath. The history is provided by the patient and a relative.  Cough Associated symptoms: shortness of breath   Associated symptoms: no chest pain, no eye discharge and no wheezing   Shortness of Breath Associated symptoms: cough   Associated symptoms: no abdominal pain, no chest pain, no neck pain and no wheezing     Past Medical History  Diagnosis Date  . Palpitations   . 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 Aortic stenosis, severe     Bicuspid valve  . Valvular heart disease   . Migraine headache   . Bronchitis   . COPD (chronic obstructive pulmonary disease)   . CHF (congestive heart failure)   . Asthma   . Aortic aneurysm   . Dysrhythmia     palpitations  . Anxiety   . GERD (gastroesophageal reflux  disease)   . Arthritis   . Shortness of breath     Nodule left lung CT done 8/6   Past Surgical History  Procedure Laterality Date  . Sigmoidoscopy  10/13/05, 09/19/05  . Colonoscopy  10/13/05  . Aortic valve replacement    . Cholecystectomy    . Abdominal hysterectomy    . Knee arthroscopy      rt x4  x1 lft  . Tonsillectomy    . Tubal ligation    . Arthroscopic repair acl  rt  . Diagnostic laparoscopy    . Tympanostomy tube placement    . Myringoplasty w/ fat graft      graft from behind ear  . Anterior cervical decomp/discectomy fusion  10/05/2011    Procedure: ANTERIOR CERVICAL DECOMPRESSION/DISCECTOMY FUSION 2 LEVELS;  Surgeon: Floyce Stakes, MD;  Location: [facility];  Service: Neurosurgery;  Laterality: N/A;  Cervical five - six , six - seven Anterior cervical decompression/diskectomy/fusion/plate   Family History  Problem Relation Age of Onset  . Cancer Mother     Type unknown  . Heart attack Father   . Diabetes      family history  . Asthma Brother   . Hyperlipidemia      family history   History  Substance Use Topics  . Smoking status: Current Every Day Smoker -- 0.10 packs/day for 30 years    Types: Cigarettes  . Smokeless tobacco: Not on file  .  Alcohol Use: No   OB History   Grav Para Term Preterm Abortions TAB SAB Ect Mult Living                 Review of Systems  Constitutional: Negative for activity change.       All ROS Neg except as noted in HPI  HENT: Positive for congestion. Negative for nosebleeds.   Eyes: Negative for photophobia and discharge.  Respiratory: Positive for cough and shortness of breath. Negative for wheezing.   Cardiovascular: Negative for chest pain and palpitations.  Gastrointestinal: Negative for abdominal pain and blood in stool.  Genitourinary: Negative for dysuria, frequency and hematuria.  Musculoskeletal: Negative for arthralgias, back pain and neck pain.  Skin: Negative.   Neurological: Negative for dizziness,  seizures and speech difficulty.  Psychiatric/Behavioral: Negative for hallucinations and confusion.      Allergies  Beta adrenergic blockers; Peanut-containing drug products; Shrimp; Avelox; Ciprofloxacin; Cleocin; Moxifloxacin; Sulfamethoxazole-trimethoprim; and Tape  Home Medications   Current Outpatient Rx  Name  Route  Sig  Dispense  Refill  . albuterol (VENTOLIN HFA) 108 (90 BASE) MCG/ACT inhaler   Inhalation   Inhale 1 puff into the lungs 4 (four) times daily as needed. For shortness of breath         . ALPRAZolam (XANAX) 1 MG tablet   Oral   Take 1 mg by mouth 4 (four) times daily as needed. For anxiety         . amoxicillin (AMOXIL) 500 MG capsule   Oral   Take 500 mg by mouth once. For dental appt.         . benzonatate (TESSALON) 100 MG capsule   Oral   Take 200 mg by mouth 2 (two) times daily. For cough         . budesonide-formoterol (SYMBICORT) 160-4.5 MCG/ACT inhaler   Inhalation   Inhale 2 puffs into the lungs 2 (two) times daily.         Marland Kitchen buPROPion (WELLBUTRIN SR) 150 MG 12 hr tablet   Oral   Take 150 mg by mouth 2 (two) times daily.         . cephALEXin (KEFLEX) 500 MG capsule   Oral   Take 500 mg by mouth 3 (three) times daily. 10 day course starting on 04/26/2013. Patient completed day 7 of course on 05/03/2013. Patient had started course prior to MD prescription. Patient had this medication leftover from previous         . cetirizine (ZYRTEC) 10 MG tablet   Oral   Take 10 mg by mouth every morning.          . diphenhydrAMINE (BENADRYL) 25 MG tablet   Oral   Take 25 mg by mouth 2 (two) times daily as needed for itching or allergies.         Marland Kitchen EPINEPHrine (EPIPEN) 0.3 mg/0.3 mL DEVI   Intramuscular   Inject 0.3 mg into the muscle as needed. For allergic reaction         . esomeprazole (NEXIUM) 40 MG capsule   Oral   Take 40 mg by mouth 2 (two) times daily.           . furosemide (LASIX) 40 MG tablet   Oral   Take 40 mg  by mouth 2 (two) times daily.         Marland Kitchen ibuprofen (ADVIL,MOTRIN) 200 MG tablet   Oral   Take 200 mg by mouth every 6 (six) hours  as needed.         . montelukast (SINGULAIR) 10 MG tablet   Oral   Take 10 mg by mouth at bedtime.          Marland Kitchen oxyCODONE-acetaminophen (PERCOCET) 10-325 MG per tablet   Oral   Take 1 tablet by mouth 5 (five) times daily as needed for pain. As needed for pain         . oxymorphone (OPANA ER) 20 MG 12 hr tablet   Oral   Take 20 mg by mouth 2 (two) times daily.           . potassium chloride (K-DUR) 10 MEQ tablet   Oral   Take 20 mEq by mouth 2 (two) times daily.         . rosuvastatin (CRESTOR) 10 MG tablet   Oral   Take 10 mg by mouth every morning.         . SUMAtriptan (IMITREX) 100 MG tablet   Oral   Take 100 mg by mouth as needed. For migraines         . tiotropium (SPIRIVA) 18 MCG inhalation capsule   Inhalation   Place 18 mcg into inhaler and inhale at bedtime.          BP 116/86  Pulse 86  Temp(Src) 98.4 F (36.9 C) (Oral)  Resp 18  Ht 5\' 4"  (1.626 m)  Wt 153 lb (69.4 kg)  BMI 26.25 kg/m2  SpO2 95% Physical Exam  Nursing note and vitals reviewed. Constitutional: She is oriented to person, place, and time. She appears well-developed and well-nourished.  Non-toxic appearance.  HENT:  Head: Normocephalic.  Right Ear: Tympanic membrane and external ear normal.  Left Ear: Tympanic membrane and external ear normal.  Nasal congestion present.  Eyes: EOM and lids are normal. Pupils are equal, round, and reactive to light.  Neck: Normal range of motion. Neck supple. Carotid bruit is not present.  Cardiovascular: Normal rate, regular rhythm, normal heart sounds, intact distal pulses and normal pulses.   Pulmonary/Chest: Breath sounds normal. No respiratory distress.  Rhonchi and end expiratory wheezes present on auscultation. Respirations are mildly labored. Patient speaks however in complete sentences.  Abdominal: Soft.  Bowel sounds are normal. There is no tenderness. There is no guarding.  Musculoskeletal: Normal range of motion. She exhibits no edema.  Lymphadenopathy:       Head (right side): No submandibular adenopathy present.       Head (left side): No submandibular adenopathy present.    She has no cervical adenopathy.  Neurological: She is alert and oriented to person, place, and time. She has normal strength. No cranial nerve deficit or sensory deficit.  Skin: Skin is warm and dry.  Psychiatric: She has a normal mood and affect. Her speech is normal.    ED Course  Procedures (including critical care time) Labs Review Labs Reviewed  CBC WITH DIFFERENTIAL - Abnormal; Notable for the following:    Neutrophils Relative % 42 (*)    Lymphocytes Relative 47 (*)    All other components within normal limits  BASIC METABOLIC PANEL - Abnormal; Notable for the following:    Glucose, Bld 103 (*)    GFR calc non Af Amer 60 (*)    GFR calc Af Amer 69 (*)    All other components within normal limits   Imaging Review Dg Chest 2 View  05/04/2013   CLINICAL DATA:  Dyspnea and cough and wheezing, history of COPD, on  home oxygen  EXAM: CHEST  2 VIEW  COMPARISON:  CT ANGIO CHEST W/CM &/OR WO/CM dated 09/05/2012; DG CHEST 2 VIEW dated 09/30/2011  FINDINGS: The lungs remain hyperinflated and hyperlucent. There is no pneumothorax or pneumomediastinum or pleural effusion. The cardiac silhouette is normal in size. The pulmonary vascularity is not engorged. The mediastinum is normal in width. There are 7 intact sternal wires present. The patient has undergone lower anterior cervicothoracic fusion.  IMPRESSION: There is hyperinflation consistent with COPD. There is no evidence of pneumonia. One cannot exclude acute bronchitis in the appropriate clinical setting. There is no evidence of CHF.   Electronically Signed   By: David  Martinique   On: 05/04/2013 20:46     EKG Interpretation None     Pusle ox 95% on room air.WNL by my  interpretation. MDM The chest x-ray shows evidence of chronic obstructive pulmonary disease. There is no evidence for pneumonia or congestive heart failure. Bronchitis cannot be excluded. Patient was treated with prednisone, albuterol nebulizer, and Tussionex.  After treatment and observation, pt breathing some better. She feels she can management this at home with her nebulizer machine. Will add biaxin and decadron to current medications.   Final diagnoses:  None    *I have reviewed nursing notes, vital signs, and all appropriate lab and imaging results for this patient.Lenox Ahr, PA-C 05/05/13 1610

## 2013-05-05 MED ORDER — CLARITHROMYCIN 500 MG PO TABS: 500.0000 mg | ORAL_TABLET | Freq: Two times a day (BID) | ORAL | Status: DC

## 2013-05-05 MED ORDER — DEXAMETHASONE 4 MG PO TABS: 4.0000 mg | ORAL_TABLET | Freq: Two times a day (BID) | ORAL | Status: DC

## 2013-05-05 NOTE — Discharge Instructions (Signed)
Your chest x-ray is negative for pneumonia, there is some question of bronchitis. Please stop the Keflex, use Biaxin 2 times daily until all taken. Please add Decadron to your current medications. Please use your albuterol every 4 hours if needed for difficulty with her breathing. Please see your primary physician, or return to the emergency department if any changes in your breathing, high fever, or deterioration in your general condition.

## 2013-05-05 NOTE — ED Provider Notes (Signed)
Medical screening examination/treatment/procedure(s) were performed by non-physician practitioner and as supervising physician I was immediately available for consultation/collaboration.   EKG Interpretation   Date/Time:  Saturday May 04 2013 21:09:43 EST Ventricular Rate:  72 PR Interval:  142 QRS Duration: 98 QT Interval:  408 QTC Calculation: 446 R Axis:   89 Text Interpretation:  Normal sinus rhythm Possible Left atrial enlargement  Cannot rule out Anterior infarct , age undetermined No previous ECGs  available Confirmed by [facility]  MD, Jenny Reichmann ([id number]) on 05/04/2013 11:26:58 PM       Babette Relic, MD 05/05/13 1425

## 2013-05-06 NOTE — ED Provider Notes (Signed)
Medical screening examination/treatment/procedure(s) were performed by non-physician practitioner and as supervising physician I was immediately available for consultation/collaboration.   EKG Interpretation   Date/Time:  Saturday May 04 2013 21:09:43 EST Ventricular Rate:  72 PR Interval:  142 QRS Duration: 98 QT Interval:  408 QTC Calculation: 446 R Axis:   89 Text Interpretation:  Normal sinus rhythm Possible Left atrial enlargement  Cannot rule out Anterior infarct , age undetermined No previous ECGs  available Confirmed by [facility]  MD, Jenny Reichmann ([id number]) on 05/04/2013 11:26:58 PM       Babette Relic, MD 05/06/13 0225

## 2013-05-09 ENCOUNTER — Other Ambulatory Visit: Payer: Self-pay | Admitting: Cardiology

## 2013-09-04 ENCOUNTER — Ambulatory Visit: Payer: Medicaid Other | Admitting: Cardiovascular Disease

## 2013-09-30 ENCOUNTER — Other Ambulatory Visit: Payer: Self-pay | Admitting: Cardiovascular Disease

## 2013-10-21 ENCOUNTER — Ambulatory Visit (INDEPENDENT_AMBULATORY_CARE_PROVIDER_SITE_OTHER): Payer: Medicaid Other | Admitting: Cardiovascular Disease

## 2013-10-21 ENCOUNTER — Encounter: Payer: Self-pay | Admitting: Cardiovascular Disease

## 2013-10-21 VITALS — BP 124/68 | HR 75 | Ht 64.0 in | Wt 161.0 lb

## 2013-10-21 DIAGNOSIS — Z01810 Encounter for preprocedural cardiovascular examination: Secondary | ICD-10-CM

## 2013-10-21 DIAGNOSIS — I350 Nonrheumatic aortic (valve) stenosis: Secondary | ICD-10-CM

## 2013-10-21 DIAGNOSIS — I712 Thoracic aortic aneurysm, without rupture, unspecified: Secondary | ICD-10-CM

## 2013-10-21 DIAGNOSIS — F17200 Nicotine dependence, unspecified, uncomplicated: Secondary | ICD-10-CM

## 2013-10-21 DIAGNOSIS — I359 Nonrheumatic aortic valve disorder, unspecified: Secondary | ICD-10-CM

## 2013-10-21 LAB — BASIC METABOLIC PANEL
BUN: 24 mg/dL — ABNORMAL HIGH (ref 6–23)
CALCIUM: 9 mg/dL (ref 8.4–10.5)
CO2: 31 mEq/L (ref 19–32)
CREATININE: 0.8 mg/dL (ref 0.4–1.2)
Chloride: 102 mEq/L (ref 96–112)
GFR: 84.84 mL/min (ref >60.00)
Glucose, Bld: 76 mg/dL (ref 70–99)
Potassium: 4.5 mEq/L (ref 3.5–5.1)
Sodium: 139 mEq/L (ref 135–145)

## 2013-10-21 NOTE — Patient Instructions (Addendum)
Your physician wants you to follow-up in:  12 months.  You will receive a reminder letter in the mail two months in advance. If you don't receive a letter, please call our office to schedule the follow-up appointment.  Non-Cardiac CT Angiography (CTA), is a special type of CT scan that uses a computer to produce multi-dimensional views of major blood vessels throughout the body. In CT angiography, a contrast material is injected through an IV to help visualize the blood vessels. To be done in [place].  Your physician has requested that you have an echocardiogram. Echocardiography is a painless test that uses sound waves to create images of your heart. It provides your doctor with information about the size and shape of your heart and how well your heart's chambers and valves are working. This procedure takes approximately one hour. There are no restrictions for this procedure. To be done in [place].

## 2013-10-21 NOTE — Progress Notes (Signed)
History of Present Illness: 53 yo female with history of bicuspid aortic valve s/p Ross procedure in 1998, thoracic aortic aneurysm, O2 dependent COPD, GERD, anxiety, ongoing tobacco abuse here today for cardiac follow up. Lindsay Bonilla has been followed in the past by Dr. [name] and I am seeing her for the first time today. Lindsay Bonilla needs a right  knee replacement soon (Dr. Alvan Dame). Lindsay Bonilla has no chest pain or change in her breathing. Her knee pain is her main complaint. Overall feeling well.   Primary Care Physician: [name]  Last Lipid Profile: Followed in primary care   Past Medical History  Diagnosis Date  . Palpitations   . 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 Aortic stenosis, severe     Bicuspid valve  . Valvular heart disease   . Migraine headache   . Bronchitis   . COPD (chronic obstructive pulmonary disease)   . CHF (congestive heart failure)   . Asthma   . Aortic aneurysm   . Dysrhythmia     palpitations  . Anxiety   . GERD (gastroesophageal reflux disease)   . Arthritis   . Shortness of breath     Nodule left lung CT done 8/6    Past Surgical History  Procedure Laterality Date  . Sigmoidoscopy  10/13/05, 09/19/05  . Colonoscopy  10/13/05  . Aortic valve replacement    . Cholecystectomy    . Abdominal hysterectomy    . Knee arthroscopy      rt x4  x1 lft  . Tonsillectomy    . Tubal ligation    . Arthroscopic repair acl  rt  . Diagnostic laparoscopy    . Tympanostomy tube placement    . Myringoplasty w/ fat graft      graft from behind ear  . Anterior cervical decomp/discectomy fusion  10/05/2011    Procedure: ANTERIOR CERVICAL DECOMPRESSION/DISCECTOMY FUSION 2 LEVELS;  Surgeon: Floyce Stakes, MD;  Location: [facility];  Service: Neurosurgery;  Laterality: N/A;  Cervical five - six , six - seven Anterior cervical decompression/diskectomy/fusion/plate    Current Outpatient Prescriptions  Medication Sig Dispense Refill  . albuterol  (VENTOLIN HFA) 108 (90 BASE) MCG/ACT inhaler Inhale 1 puff into the lungs 4 (four) times daily as needed. For shortness of breath      . ALPRAZolam (XANAX) 1 MG tablet Take 1 mg by mouth 4 (four) times daily as needed. For anxiety      . cetirizine (ZYRTEC) 10 MG tablet Take 10 mg by mouth every morning.       Marland Kitchen EPINEPHrine (EPIPEN) 0.3 mg/0.3 mL DEVI Inject 0.3 mg into the muscle as needed. For allergic reaction      . esomeprazole (NEXIUM) 40 MG capsule Take 40 mg by mouth 2 (two) times daily.        . montelukast (SINGULAIR) 10 MG tablet Take 10 mg by mouth at bedtime.       Marland Kitchen oxyCODONE-acetaminophen (PERCOCET) 10-325 MG per tablet Take 1 tablet by mouth 5 (five) times daily as needed for pain. As needed for pain      . oxymorphone (OPANA ER) 20 MG 12 hr tablet Take 20 mg by mouth 2 (two) times daily.        . SUMAtriptan (IMITREX) 100 MG tablet Take 100 mg by mouth as needed. For migraines      . budesonide-formoterol (SYMBICORT) 160-4.5 MCG/ACT inhaler Inhale 2 puffs into the lungs 2 (two)  times daily.      Marland Kitchen buPROPion (WELLBUTRIN SR) 150 MG 12 hr tablet Take 150 mg by mouth 2 (two) times daily.      . CRESTOR 10 MG tablet TAKE 1 TABLET ONCE DAILY.  30 tablet  0  . cyclobenzaprine (FLEXERIL) 10 MG tablet Take 10 mg by mouth at bedtime. Taking two tablets daily at bedtime.      . diphenhydrAMINE (BENADRYL) 25 MG tablet Take 25 mg by mouth 2 (two) times daily as needed for itching or allergies.      . furosemide (LASIX) 40 MG tablet TAKE (1) TABLET TWICE DAILY.  60 tablet  3  . Mometasone Furoate (NASONEX NA) Place 1 spray into the nose daily.      . Olopatadine HCl (PATADAY) 0.2 % SOLN Apply 1 drop to eye as needed.      . potassium chloride (K-DUR) 10 MEQ tablet Take 20 mEq by mouth 2 (two) times daily.      . sertraline (ZOLOFT) 100 MG tablet Take 100 mg by mouth daily.      Marland Kitchen tiotropium (SPIRIVA) 18 MCG inhalation capsule Place 18 mcg into inhaler and inhale at bedtime.       No current  facility-administered medications for this visit.    Allergies  Allergen Reactions  . Beta Adrenergic Blockers Anaphylaxis  . Peanut-Containing Drug Products Shortness Of Breath and Swelling    Swelling of the throat  . Shrimp [Shellfish Allergy] Anaphylaxis  . Avelox [Moxifloxacin Hcl In Nacl] Rash  . Ciprofloxacin Nausea And Vomiting  . Cleocin [Clindamycin Hcl] Rash  . Moxifloxacin Rash  . Sulfamethoxazole-Trimethoprim Rash  . Tape     White clear itches, rash    History   Social History  . Marital Status: Legally Separated    Spouse Name: N/A    Number of Children: N/A  . Years of Education: N/A   Occupational History  . Not on file.   Social History Main Topics  . Smoking status: Current Every Day Smoker -- 0.10 packs/day for 30 years    Types: Cigarettes  . Smokeless tobacco: Not on file  . Alcohol Use: No  . Drug Use: No  . Sexual Activity: No   Other Topics Concern  . Not on file   Social History Narrative  . No narrative on file    Family History  Problem Relation Age of Onset  . Cancer Mother     Type unknown  . Heart attack Father   . Diabetes      family history  . Asthma Brother   . Hyperlipidemia      family history    Review of Systems:  As stated in the HPI and otherwise negative.   BP 124/68  Pulse 75  Ht 5\' 4"  (1.626 m)  Wt 161 lb (73.029 kg)  BMI 27.62 kg/m2  Physical Examination: General: Well developed, well nourished, NAD HEENT: OP clear, mucus membranes moist SKIN: warm, dry. No rashes. Neuro: No focal deficits Musculoskeletal: Muscle strength 5/5 all ext Psychiatric: Mood and affect normal Neck: No JVD, no carotid bruits, no thyromegaly, no lymphadenopathy. Lungs:Clear bilaterally, no wheezes, rhonci, crackles Cardiovascular: Regular rate and rhythm. No murmurs, gallops or rubs. Abdomen:Soft. Bowel sounds present. Non-tender.  Extremities: No lower extremity edema. Pulses are 2 + in the bilateral DP/PT.  Echo  08/29/11: Left ventricle: The cavity size was normal. Wall thickness was normal. Systolic function was normal. The estimated ejection fraction was in the range of  55% to 60%. Wall motion was normal; there were no regional wall motion abnormalities. Features are consistent with a pseudonormal left ventricular filling pattern, with concomitant abnormal relaxation and increased filling pressure (grade 2 diastolic dysfunction). - Aortic valve: Mild regurgitation. - Ascending aorta: The ascending aorta was moderately dilated. - Mitral valve: Calcified annulus. Mild regurgitation. - Pulmonic valve: Peak gradient: 49mm Hg (S). Impressions:  - S/P Ross procedure; no significant PS; mild AI; ascending aorta moderately dilated; suggest CTA or MRA to better assess.  Chest CTA:  08/29/12: 1. Stable 4 millimeter left upper lobe pulmonary nodule, considered  benign.  2. Status post Ross procedure. Unchanged enlargement of the  aortic root, 4.8 cm in maximal dimension. Unchanged mild  constriction at the level of the pulmonic valve.  3. Coronary artery atherosclerosis. Centrilobular emphysema.  Assessment and Plan:   1. Aortic valve disease: s/p Ross procedure. Appears stable. No echo since July 2013. Will repeat echo now.   2. Thoracic aortic aneurysm: Stable by CTA 2014. Will need repeat Chest CTA now before her planned surgery. Will arrange now.   3. Tobacco abuse, ongoing: Lindsay Bonilla is trying to stop smoking. Lindsay Bonilla is doing well and is now down to 5 cigarettes per week.   4. Pulmonary nodules: Will evaluate with CT.   5. Pre-operative cardiovascular examination: Will arrange testing and then write letter to Dr. Alvan Dame.

## 2013-10-23 ENCOUNTER — Ambulatory Visit (HOSPITAL_COMMUNITY)
Admission: RE | Admit: 2013-10-23 | Discharge: 2013-10-23 | Disposition: A | Discharge status: Home / Self Care | Payer: Medicaid Other | Source: Ambulatory Visit | Attending: Cardiovascular Disease | Admitting: Cardiovascular Disease

## 2013-10-23 DIAGNOSIS — I712 Thoracic aortic aneurysm, without rupture, unspecified: Secondary | ICD-10-CM | POA: Insufficient documentation

## 2013-10-23 DIAGNOSIS — I359 Nonrheumatic aortic valve disorder, unspecified: Secondary | ICD-10-CM

## 2013-10-23 DIAGNOSIS — R911 Solitary pulmonary nodule: Secondary | ICD-10-CM | POA: Diagnosis not present

## 2013-10-23 DIAGNOSIS — F17200 Nicotine dependence, unspecified, uncomplicated: Secondary | ICD-10-CM | POA: Diagnosis not present

## 2013-10-23 DIAGNOSIS — Z954 Presence of other heart-valve replacement: Secondary | ICD-10-CM | POA: Insufficient documentation

## 2013-10-23 DIAGNOSIS — I509 Heart failure, unspecified: Secondary | ICD-10-CM | POA: Diagnosis not present

## 2013-10-23 DIAGNOSIS — J438 Other emphysema: Secondary | ICD-10-CM | POA: Insufficient documentation

## 2013-10-23 DIAGNOSIS — I350 Nonrheumatic aortic (valve) stenosis: Secondary | ICD-10-CM

## 2013-10-23 MED ORDER — SODIUM CHLORIDE 0.9 % IJ SOLN
INTRAMUSCULAR | Status: AC
  Filled 2013-10-23: qty 45

## 2013-10-23 MED ORDER — IOHEXOL 350 MG/ML SOLN
100.0000 mL | Freq: Once | INTRAVENOUS | Status: AC | PRN
  Administered 2013-10-23: 100 mL via INTRAVENOUS

## 2013-10-23 NOTE — Progress Notes (Signed)
  Echocardiogram 2D Echocardiogram has been performed.  [place], [place] 10/23/2013, 11:43 AM

## 2013-10-24 ENCOUNTER — Encounter: Payer: Self-pay | Admitting: Cardiovascular Disease

## 2013-11-05 ENCOUNTER — Other Ambulatory Visit: Payer: Self-pay | Admitting: Cardiovascular Disease

## 2013-11-12 ENCOUNTER — Other Ambulatory Visit: Payer: Self-pay | Admitting: Cardiology

## 2013-12-12 ENCOUNTER — Telehealth: Payer: Self-pay | Admitting: Cardiovascular Disease

## 2013-12-12 ENCOUNTER — Telehealth: Payer: Self-pay | Admitting: *Deleted

## 2013-12-12 DIAGNOSIS — E785 Hyperlipidemia, unspecified: Secondary | ICD-10-CM

## 2013-12-12 NOTE — Telephone Encounter (Signed)
Crestor samples placed at the front desk for patient. 

## 2013-12-12 NOTE — Telephone Encounter (Signed)
Sent to refill CMA's

## 2013-12-12 NOTE — Telephone Encounter (Signed)
New message     Pt is out of crestor.  Did we get a prior authorization for this medication?  Please call and let pt know

## 2013-12-12 NOTE — Telephone Encounter (Signed)
Will check to see if prior auth was done or is progress

## 2013-12-13 NOTE — Telephone Encounter (Signed)
Crestor was approved last September for one year. I spoke with Medicaid--[phone] to do prior approval.  Pt needs to have tried 2 other preferred medications. She has tried Lipitor and failed this.  Other preferred medications are Lovastatin, Pravastatin and Simvastatin. I do not see in chart pt has been on these medications. I placed call to pt to see if she has ever been on these medications. Left message to call back

## 2013-12-17 NOTE — Telephone Encounter (Signed)
I will forward to dr/nurse 

## 2013-12-17 NOTE — Telephone Encounter (Signed)
Patient called and was very upset that her PA for crestor had not been approved yet. She stated that the pharmacy has sent it to [place] three times and it "looks bad" that it still is not approved. I made her aware that she needed to try two preferred medications before medicaid would approve it, but per our records she has only tried and failed lipitor. She said that she had tried another med but she cannot remember the name and she should not have to, it should be in her record. I made her aware that the emr system changed years ago, and I was unsure if anyone had access to this or not. I mentioned that Fraser Din called her last week and left her a vm to call back and discuss whether or not she had tried anything else, but she stated that she did not receive a call or a vm. She is going to consult with [employer] and get her call history printed to prove this. She says that she was without this medication for two weeks. She was offered samples last week but stated that she is not driving that far to pick them up. I reiterated that she could pick up at least a month supply of samples, but again she told me that she was not driving that far and it was sad that she had to get some from her pcp because prior auth had not been taken care of.  After a 25 minute conversation she hung up on me. Please advise. Thanks, MI

## 2013-12-18 MED ORDER — SIMVASTATIN 40 MG PO TABS
40.0000 mg | ORAL_TABLET | Freq: Every day | ORAL | Status: DC
Start: 2013-12-18 — End: 2014-12-25

## 2013-12-18 NOTE — Telephone Encounter (Signed)
Agree. cdm 

## 2013-12-18 NOTE — Telephone Encounter (Signed)
Spoke with pt. She thinks she may have been on another cholesterol medication in the past but does not know name.  She is willing to try Simvastatin. She will continue 2 week supply of Crestor given to her by primary care and then change to Simvastatin 40 mg by mouth daily. She will come in for fasting labs on March 11, 2014

## 2014-01-06 ENCOUNTER — Telehealth: Payer: Self-pay | Admitting: Cardiovascular Disease

## 2014-01-06 NOTE — Telephone Encounter (Signed)
New message           Pt needs an ok for knee replacement surgery to be faxed to [phone] to [facility] / pt would like for someone to notify her when it has been faxed

## 2014-01-06 NOTE — Progress Notes (Signed)
Patient ID: Lindsay Bonilla, female   DOB: June 07, 1960, 53 y.o.   MRN: [id number] Placed back samples on 11.6.15 that were placed up front on 10.15.15

## 2014-01-06 NOTE — Telephone Encounter (Signed)
Spoke with pt and told her letter had been sent to Dr. Alvan Dame. Pt states she saw Dr. Theda Sers today and he is going to do surgery now and was unable to find clearance letter. Will refax letter.  I spoke with [facility] and confirmed fax number listed below is correct.

## 2014-01-16 ENCOUNTER — Other Ambulatory Visit: Payer: Self-pay | Admitting: Cardiovascular Disease

## 2014-01-25 ENCOUNTER — Emergency Department (HOSPITAL_COMMUNITY)
Admission: EM | Admit: 2014-01-25 | Discharge: 2014-01-25 | Disposition: A | Discharge status: Home / Self Care | Payer: Medicaid Other | Attending: Emergency Medicine | Admitting: Emergency Medicine

## 2014-01-25 ENCOUNTER — Encounter (HOSPITAL_COMMUNITY): Payer: Self-pay | Admitting: Emergency Medicine

## 2014-01-25 DIAGNOSIS — Z792 Long term (current) use of antibiotics: Secondary | ICD-10-CM | POA: Insufficient documentation

## 2014-01-25 DIAGNOSIS — M1990 Unspecified osteoarthritis, unspecified site: Secondary | ICD-10-CM | POA: Insufficient documentation

## 2014-01-25 DIAGNOSIS — J449 Chronic obstructive pulmonary disease, unspecified: Secondary | ICD-10-CM | POA: Insufficient documentation

## 2014-01-25 DIAGNOSIS — K219 Gastro-esophageal reflux disease without esophagitis: Secondary | ICD-10-CM | POA: Diagnosis not present

## 2014-01-25 DIAGNOSIS — Z7952 Long term (current) use of systemic steroids: Secondary | ICD-10-CM | POA: Diagnosis not present

## 2014-01-25 DIAGNOSIS — R011 Cardiac murmur, unspecified: Secondary | ICD-10-CM | POA: Diagnosis not present

## 2014-01-25 DIAGNOSIS — I509 Heart failure, unspecified: Secondary | ICD-10-CM | POA: Diagnosis not present

## 2014-01-25 DIAGNOSIS — J45909 Unspecified asthma, uncomplicated: Secondary | ICD-10-CM | POA: Diagnosis not present

## 2014-01-25 DIAGNOSIS — F419 Anxiety disorder, unspecified: Secondary | ICD-10-CM | POA: Diagnosis not present

## 2014-01-25 DIAGNOSIS — Z720 Tobacco use: Secondary | ICD-10-CM | POA: Diagnosis not present

## 2014-01-25 DIAGNOSIS — J069 Acute upper respiratory infection, unspecified: Secondary | ICD-10-CM | POA: Diagnosis not present

## 2014-01-25 DIAGNOSIS — Z79899 Other long term (current) drug therapy: Secondary | ICD-10-CM | POA: Diagnosis not present

## 2014-01-25 DIAGNOSIS — R0981 Nasal congestion: Secondary | ICD-10-CM | POA: Diagnosis present

## 2014-01-25 MED ORDER — CEPHALEXIN 500 MG PO CAPS
500.0000 mg | ORAL_CAPSULE | Freq: Once | ORAL | Status: AC
  Administered 2014-01-25: 500 mg via ORAL
  Filled 2014-01-25: qty 1

## 2014-01-25 MED ORDER — OXYMETAZOLINE HCL 0.05 % NA SOLN
2.0000 | Freq: Once | NASAL | Status: AC
  Administered 2014-01-25: 2 via NASAL

## 2014-01-25 MED ORDER — CEPHALEXIN 250 MG PO CAPS: 250.0000 mg | ORAL_CAPSULE | Freq: Four times a day (QID) | ORAL | Status: DC

## 2014-01-25 NOTE — Discharge Instructions (Signed)
Please use afrin every 8 hours for 5 days ONLY, then wait 5 days before restarting. Keflex four times daily with food. See Dr Luan Pulling for evaluation and recheck. Upper Respiratory Infection, Adult An upper respiratory infection (URI) is also sometimes known as the common cold. The upper respiratory tract includes the nose, sinuses, throat, trachea, and bronchi. Bronchi are the airways leading to the lungs. Most people improve within 1 week, but symptoms can last up to 2 weeks. A residual cough may last even longer.  CAUSES Many different viruses can infect the tissues lining the upper respiratory tract. The tissues become irritated and inflamed and often become very moist. Mucus production is also common. A cold is contagious. You can easily spread the virus to others by oral contact. This includes kissing, sharing a glass, coughing, or sneezing. Touching your mouth or nose and then touching a surface, which is then touched by another person, can also spread the virus. SYMPTOMS  Symptoms typically develop 1 to 3 days after you come in contact with a cold virus. Symptoms vary from person to person. They may include:  Runny nose.  Sneezing.  Nasal congestion.  Sinus irritation.  Sore throat.  Loss of voice (laryngitis).  Cough.  Fatigue.  Muscle aches.  Loss of appetite.  Headache.  Low-grade fever. DIAGNOSIS  You might diagnose your own cold based on familiar symptoms, since most people get a cold 2 to 3 times a year. Your caregiver can confirm this based on your exam. Most importantly, your caregiver can check that your symptoms are not due to another disease such as strep throat, sinusitis, pneumonia, asthma, or epiglottitis. Blood tests, throat tests, and X-rays are not necessary to diagnose a common cold, but they may sometimes be helpful in excluding other more serious diseases. Your caregiver will decide if any further tests are required. RISKS AND COMPLICATIONS  You may be at  risk for a more severe case of the common cold if you smoke cigarettes, have chronic heart disease (such as heart failure) or lung disease (such as asthma), or if you have a weakened immune system. The very young and very old are also at risk for more serious infections. Bacterial sinusitis, middle ear infections, and bacterial pneumonia can complicate the common cold. The common cold can worsen asthma and chronic obstructive pulmonary disease (COPD). Sometimes, these complications can require emergency medical care and may be life-threatening. PREVENTION  The best way to protect against getting a cold is to practice good hygiene. Avoid oral or hand contact with people with cold symptoms. Wash your hands often if contact occurs. There is no clear evidence that vitamin C, vitamin E, echinacea, or exercise reduces the chance of developing a cold. However, it is always recommended to get plenty of rest and practice good nutrition. TREATMENT  Treatment is directed at relieving symptoms. There is no cure. Antibiotics are not effective, because the infection is caused by a virus, not by bacteria. Treatment may include:  Increased fluid intake. Sports drinks offer valuable electrolytes, sugars, and fluids.  Breathing heated mist or steam (vaporizer or shower).  Eating chicken soup or other clear broths, and maintaining good nutrition.  Getting plenty of rest.  Using gargles or lozenges for comfort.  Controlling fevers with ibuprofen or acetaminophen as directed by your caregiver.  Increasing usage of your inhaler if you have asthma. Zinc gel and zinc lozenges, taken in the first 24 hours of the common cold, can shorten the duration and lessen the  severity of symptoms. Pain medicines may help with fever, muscle aches, and throat pain. A variety of non-prescription medicines are available to treat congestion and runny nose. Your caregiver can make recommendations and may suggest nasal or lung inhalers for  other symptoms.  HOME CARE INSTRUCTIONS   Only take over-the-counter or prescription medicines for pain, discomfort, or fever as directed by your caregiver.  Use a warm mist humidifier or inhale steam from a shower to increase air moisture. This may keep secretions moist and make it easier to breathe.  Drink enough water and fluids to keep your urine clear or pale yellow.  Rest as needed.  Return to work when your temperature has returned to normal or as your caregiver advises. You may need to stay home longer to avoid infecting others. You can also use a face mask and careful hand washing to prevent spread of the virus. SEEK MEDICAL CARE IF:   After the first few days, you feel you are getting worse rather than better.  You need your caregiver's advice about medicines to control symptoms.  You develop chills, worsening shortness of breath, or brown or red sputum. These may be signs of pneumonia.  You develop yellow or brown nasal discharge or pain in the face, especially when you bend forward. These may be signs of sinusitis.  You develop a fever, swollen neck glands, pain with swallowing, or white areas in the back of your throat. These may be signs of strep throat. SEEK IMMEDIATE MEDICAL CARE IF:   You have a fever.  You develop severe or persistent headache, ear pain, sinus pain, or chest pain.  You develop wheezing, a prolonged cough, cough up blood, or have a change in your usual mucus (if you have chronic lung disease).  You develop sore muscles or a stiff neck. Document Released: 08/10/2000 Document Revised: 05/09/2011 Document Reviewed: 05/22/2013 [facility] Patient Information 2015 [place], [place]. This information is not intended to replace advice given to you by your health care provider. Make sure you discuss any questions you have with your health care provider.

## 2014-01-25 NOTE — ED Notes (Signed)
having congestion and right ear discomfort.

## 2014-01-25 NOTE — ED Provider Notes (Addendum)
CSN: [id number]     Arrival date & time 01/25/14  1559 History   First MD Initiated Contact with Patient 01/25/14 1647     Chief Complaint  Patient presents with  . Nasal Congestion  . Otalgia     (Consider location/radiation/quality/duration/timing/severity/associated sxs/prior Treatment) Patient is a 53 y.o. female presenting with URI. The history is provided by the patient.  URI Presenting symptoms: congestion and ear pain   Presenting symptoms: no cough   Congestion:    Location:  Nasal Severity:  Moderate Onset quality:  Gradual Duration:  3 days Timing:  Intermittent Progression:  Worsening Chronicity:  New Relieved by:  Nothing Worsened by:  Nothing tried Ineffective treatments:  OTC medications Associated symptoms: headaches   Associated symptoms: no arthralgias, no neck pain and no wheezing   Risk factors: sick contacts   Risk factors: no recent travel     Past Medical History  Diagnosis Date  . Palpitations   . 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 Aortic stenosis, severe     Bicuspid valve  . Valvular heart disease   . Migraine headache   . Bronchitis   . COPD (chronic obstructive pulmonary disease)   . CHF (congestive heart failure)   . Asthma   . Aortic aneurysm   . Dysrhythmia     palpitations  . Anxiety   . GERD (gastroesophageal reflux disease)   . Arthritis   . Shortness of breath     Nodule left lung CT done 8/6   Past Surgical History  Procedure Laterality Date  . Sigmoidoscopy  10/13/05, 09/19/05  . Colonoscopy  10/13/05  . Aortic valve replacement    . Cholecystectomy    . Abdominal hysterectomy    . Knee arthroscopy      rt x4  x1 lft  . Tonsillectomy    . Tubal ligation    . Arthroscopic repair acl  rt  . Diagnostic laparoscopy    . Tympanostomy tube placement    . Myringoplasty w/ fat graft      graft from behind ear  . Anterior cervical decomp/discectomy fusion  10/05/2011    Procedure: ANTERIOR  CERVICAL DECOMPRESSION/DISCECTOMY FUSION 2 LEVELS;  Surgeon: Floyce Stakes, MD;  Location: [facility];  Service: Neurosurgery;  Laterality: N/A;  Cervical five - six , six - seven Anterior cervical decompression/diskectomy/fusion/plate   Family History  Problem Relation Age of Onset  . Cancer Mother     Type unknown  . Heart attack Father   . Diabetes      family history  . Asthma Brother   . Hyperlipidemia      family history   History  Substance Use Topics  . Smoking status: Current Every Day Smoker -- 0.10 packs/day for 30 years    Types: Cigarettes  . Smokeless tobacco: Not on file  . Alcohol Use: No   OB History    No data available     Review of Systems  Constitutional: Negative for activity change.       All ROS Neg except as noted in HPI  HENT: Positive for congestion, ear pain and postnasal drip. Negative for nosebleeds.   Eyes: Negative for photophobia and discharge.  Respiratory: Positive for shortness of breath. Negative for cough and wheezing.   Cardiovascular: Positive for palpitations. Negative for chest pain.  Gastrointestinal: Negative for abdominal pain and blood in stool.  Genitourinary: Negative for dysuria, frequency and hematuria.  Musculoskeletal:  Negative for back pain, arthralgias and neck pain.  Skin: Negative.   Neurological: Positive for headaches. Negative for dizziness, seizures and speech difficulty.  Psychiatric/Behavioral: Negative for hallucinations and confusion.      Allergies  Beta adrenergic blockers; Peanut-containing drug products; Shrimp; Avelox; Ciprofloxacin; Cleocin; Moxifloxacin; Sulfamethoxazole-trimethoprim; and Tape  Home Medications   Prior to Admission medications   Medication Sig Start Date End Date Taking? Authorizing Provider  albuterol (PROVENTIL) (2.5 MG/3ML) 0.083% nebulizer solution Take 2.5 mg by nebulization every 6 (six) hours as needed for wheezing or shortness of breath.   Yes Historical Provider, MD   albuterol (VENTOLIN HFA) 108 (90 BASE) MCG/ACT inhaler Inhale 1 puff into the lungs 4 (four) times daily as needed. For shortness of breath   Yes Historical Provider, MD  ALPRAZolam Duanne Moron) 1 MG tablet Take 1 mg by mouth 4 (four) times daily as needed. For anxiety   Yes Historical Provider, MD  budesonide-formoterol (SYMBICORT) 160-4.5 MCG/ACT inhaler Inhale 2 puffs into the lungs 2 (two) times daily.   Yes Historical Provider, MD  buPROPion (WELLBUTRIN SR) 150 MG 12 hr tablet Take 150 mg by mouth 2 (two) times daily.   Yes Historical Provider, MD  cetirizine (ZYRTEC) 10 MG tablet Take 10 mg by mouth every morning.    Yes Historical Provider, MD  cyclobenzaprine (FLEXERIL) 10 MG tablet Take 20 mg by mouth at bedtime.    Yes Historical Provider, MD  diphenhydrAMINE (BENADRYL) 25 MG tablet Take 25 mg by mouth 2 (two) times daily as needed for itching or allergies.   Yes Historical Provider, MD  furosemide (LASIX) 40 MG tablet TAKE (1) TABLET TWICE DAILY. 01/17/14  Yes [name], MD  ibuprofen (ADVIL,MOTRIN) 200 MG tablet Take 200 mg by mouth every 6 (six) hours as needed for mild pain.   Yes Historical Provider, MD  Mometasone Furoate (NASONEX NA) Place 2 sprays into the nose daily.    Yes Historical Provider, MD  montelukast (SINGULAIR) 10 MG tablet Take 10 mg by mouth at bedtime.    Yes Historical Provider, MD  neomycin-colistin-hydrocortisone-thonzonium (CORTISPORIN TC) 3.04-30-08-0.5 MG/ML otic suspension Place 3 drops into both ears as needed.   Yes Historical Provider, MD  Olopatadine HCl (PATADAY) 0.2 % SOLN Apply 1 drop to eye as needed.   Yes Historical Provider, MD  omeprazole (PRILOSEC) 20 MG capsule Take 20 mg by mouth 2 (two) times daily before a meal.   Yes Historical Provider, MD  oxyCODONE-acetaminophen (PERCOCET) 10-325 MG per tablet Take 1 tablet by mouth 5 (five) times daily as needed for pain. As needed for pain   Yes Historical Provider, MD  potassium chloride (K-DUR)  10 MEQ tablet TAKE 2 TABLETS BY MOUTH TWICE DAILY. 11/13/13  Yes [name], MD  PRESCRIPTION MEDICATION Inject as directed every 30 (thirty) days. Allergy Injections   Yes Historical Provider, MD  simvastatin (ZOCOR) 40 MG tablet Take 1 tablet (40 mg total) by mouth at bedtime. 12/18/13  Yes [name], MD  Tiotropium Bromide Monohydrate (SPIRIVA RESPIMAT) 1.25 MCG/ACT AERS Inhale 1 spray into the lungs 2 (two) times daily.   Yes Historical Provider, MD  EPINEPHrine (EPIPEN) 0.3 mg/0.3 mL DEVI Inject 0.3 mg into the muscle as needed. For allergic reaction    Historical Provider, MD  SUMAtriptan (IMITREX) 100 MG tablet Take 100 mg by mouth as needed. For migraines    Historical Provider, MD   BP 133/72 mmHg  Pulse 91  Temp(Src) 98.6 F (37 C) (Oral)  Resp 18  Ht 5\' 4"  (1.626 m)  Wt 165 lb (74.844 kg)  BMI 28.31 kg/m2  SpO2 98% Physical Exam  Constitutional: She is oriented to person, place, and time. She appears well-developed and well-nourished.  Non-toxic appearance.  HENT:  Head: Normocephalic.  Right Ear: Tympanic membrane and external ear normal.  Left Ear: Tympanic membrane and external ear normal.  There is scaring of the right EAC. Question mild fluid behind the TM. No FB of the right or left . No drainage.  Eyes: EOM and lids are normal. Pupils are equal, round, and reactive to light.  Neck: Normal range of motion. Neck supple. Carotid bruit is not present.  Cardiovascular: Normal rate, regular rhythm, intact distal pulses and normal pulses.   Murmur heard.  Systolic murmur is present with a grade of 2/6  Pulmonary/Chest: Breath sounds normal. No respiratory distress.  Abdominal: Soft. Bowel sounds are normal. There is no tenderness. There is no guarding.  Musculoskeletal: Normal range of motion.  Lymphadenopathy:       Head (right side): No submandibular adenopathy present.       Head (left side): No submandibular adenopathy present.    She has no  cervical adenopathy.  Neurological: She is alert and oriented to person, place, and time. She has normal strength. No cranial nerve deficit or sensory deficit.  Skin: Skin is warm and dry.  Psychiatric: She has a normal mood and affect. Her speech is normal.  Nursing note and vitals reviewed.   ED Course  Procedures (including critical care time) Labs Review Labs Reviewed - No data to display  Imaging Review No results found.   EKG Interpretation None      MDM   No fever noted. There is no problems of the mastoid area of the right or the left ear. There is no drainage from the ears. There is nasal congestion present. The patient has a murmur in the aortic area, but this is not new, the patient has history of aortic valve problems. There is no increased redness of the posterior pharynx, the uvula is in the midline.  Suspect the patient has an upper respiratory infection causing discomfort and otalgia. The patient is encouraged to use Afrin spray for 5 days only. She is also treated with Keflex because of her aortic valve issue. She is encouraged to increase fluids, and use Tylenol or ibuprofen for her discomfort and for any flash fevers. She is further advised to return to the emergency department or see her primary physician if any changes, problems, or concerns.    Final diagnoses:  URI (upper respiratory infection)    **I have reviewed nursing notes, vital signs, and all appropriate lab and imaging results for this patient.Lenox Ahr, PA-C 01/25/14 [place], MD 01/25/14 2104  Lenox Ahr, PA-C 02/15/14 [place], MD 02/15/14 [phone]

## 2014-02-28 HISTORY — PX: JOINT REPLACEMENT: SHX530

## 2014-03-11 ENCOUNTER — Other Ambulatory Visit: Payer: Medicaid Other | Admitting: *Deleted

## 2014-03-25 ENCOUNTER — Other Ambulatory Visit (INDEPENDENT_AMBULATORY_CARE_PROVIDER_SITE_OTHER): Payer: Medicaid Other | Admitting: *Deleted

## 2014-03-25 DIAGNOSIS — E785 Hyperlipidemia, unspecified: Secondary | ICD-10-CM

## 2014-03-25 LAB — HEPATIC FUNCTION PANEL
ALT: 15 U/L (ref 0–35)
AST: 18 U/L (ref 0–37)
Albumin: 3.9 g/dL (ref 3.5–5.2)
Alkaline Phosphatase: 76 U/L (ref 39–117)
Bilirubin, Direct: 0.1 mg/dL (ref 0.0–0.3)
TOTAL PROTEIN: 6.4 g/dL (ref 6.0–8.3)
Total Bilirubin: 0.5 mg/dL (ref 0.2–1.2)

## 2014-03-25 LAB — LIPID PANEL
CHOL/HDL RATIO: 4
Cholesterol: 150 mg/dL (ref 0–200)
HDL: 35.9 mg/dL — ABNORMAL LOW (ref >39.00)
NonHDL: 114.1
Triglycerides: 257 mg/dL — ABNORMAL HIGH (ref 0.0–149.0)
VLDL: 51.4 mg/dL — ABNORMAL HIGH (ref 0.0–40.0)

## 2014-03-26 LAB — LDL CHOLESTEROL, DIRECT: Direct LDL: 79 mg/dL

## 2014-08-31 ENCOUNTER — Encounter (HOSPITAL_COMMUNITY): Payer: Self-pay | Admitting: *Deleted

## 2014-08-31 ENCOUNTER — Emergency Department (HOSPITAL_COMMUNITY)
Admission: EM | Admit: 2014-08-31 | Discharge: 2014-08-31 | Disposition: A | Discharge status: Home / Self Care | Payer: Medicaid Other | Attending: Emergency Medicine | Admitting: Emergency Medicine

## 2014-08-31 ENCOUNTER — Emergency Department (HOSPITAL_COMMUNITY): Payer: Medicaid Other

## 2014-08-31 DIAGNOSIS — F419 Anxiety disorder, unspecified: Secondary | ICD-10-CM | POA: Insufficient documentation

## 2014-08-31 DIAGNOSIS — Z791 Long term (current) use of non-steroidal anti-inflammatories (NSAID): Secondary | ICD-10-CM | POA: Insufficient documentation

## 2014-08-31 DIAGNOSIS — M1990 Unspecified osteoarthritis, unspecified site: Secondary | ICD-10-CM | POA: Insufficient documentation

## 2014-08-31 DIAGNOSIS — Z720 Tobacco use: Secondary | ICD-10-CM | POA: Insufficient documentation

## 2014-08-31 DIAGNOSIS — G43909 Migraine, unspecified, not intractable, without status migrainosus: Secondary | ICD-10-CM | POA: Insufficient documentation

## 2014-08-31 DIAGNOSIS — Z954 Presence of other heart-valve replacement: Secondary | ICD-10-CM | POA: Insufficient documentation

## 2014-08-31 DIAGNOSIS — I509 Heart failure, unspecified: Secondary | ICD-10-CM | POA: Diagnosis not present

## 2014-08-31 DIAGNOSIS — Z7951 Long term (current) use of inhaled steroids: Secondary | ICD-10-CM | POA: Diagnosis not present

## 2014-08-31 DIAGNOSIS — K219 Gastro-esophageal reflux disease without esophagitis: Secondary | ICD-10-CM | POA: Diagnosis not present

## 2014-08-31 DIAGNOSIS — Z79899 Other long term (current) drug therapy: Secondary | ICD-10-CM | POA: Insufficient documentation

## 2014-08-31 DIAGNOSIS — Z792 Long term (current) use of antibiotics: Secondary | ICD-10-CM | POA: Insufficient documentation

## 2014-08-31 DIAGNOSIS — J441 Chronic obstructive pulmonary disease with (acute) exacerbation: Secondary | ICD-10-CM | POA: Diagnosis not present

## 2014-08-31 DIAGNOSIS — R05 Cough: Secondary | ICD-10-CM | POA: Diagnosis present

## 2014-08-31 DIAGNOSIS — J40 Bronchitis, not specified as acute or chronic: Secondary | ICD-10-CM

## 2014-08-31 LAB — COMPREHENSIVE METABOLIC PANEL
ALBUMIN: 3.8 g/dL (ref 3.5–5.0)
ALT: 18 U/L (ref 14–54)
AST: 15 U/L (ref 15–41)
Alkaline Phosphatase: 72 U/L (ref 38–126)
Anion gap: 8 (ref 5–15)
BILIRUBIN TOTAL: 0.6 mg/dL (ref 0.3–1.2)
BUN: 26 mg/dL — ABNORMAL HIGH (ref 6–20)
CO2: 27 mmol/L (ref 22–32)
Calcium: 8.2 mg/dL — ABNORMAL LOW (ref 8.9–10.3)
Chloride: 106 mmol/L (ref 101–111)
Creatinine, Ser: 0.65 mg/dL (ref 0.44–1.00)
Glucose, Bld: 99 mg/dL (ref 65–99)
Potassium: 3.7 mmol/L (ref 3.5–5.1)
Sodium: 141 mmol/L (ref 135–145)
Total Protein: 6.2 g/dL — ABNORMAL LOW (ref 6.5–8.1)

## 2014-08-31 LAB — CBC WITH DIFFERENTIAL/PLATELET
Basophils Absolute: 0 10*3/uL (ref 0.0–0.1)
Basophils Relative: 0 % (ref 0–1)
EOS ABS: 0.2 10*3/uL (ref 0.0–0.7)
Eosinophils Relative: 1 % (ref 0–5)
HCT: 43.6 % (ref 36.0–46.0)
HEMOGLOBIN: 13.8 g/dL (ref 12.0–15.0)
LYMPHS PCT: 31 % (ref 12–46)
Lymphs Abs: 4.4 10*3/uL — ABNORMAL HIGH (ref 0.7–4.0)
MCH: 29.3 pg (ref 26.0–34.0)
MCHC: 31.7 g/dL (ref 30.0–36.0)
MCV: 92.6 fL (ref 78.0–100.0)
MONO ABS: 1.3 10*3/uL — AB (ref 0.1–1.0)
Monocytes Relative: 9 % (ref 3–12)
NEUTROS ABS: 8.3 10*3/uL — AB (ref 1.7–7.7)
NEUTROS PCT: 59 % (ref 43–77)
Platelets: 318 10*3/uL (ref 150–400)
RBC: 4.71 MIL/uL (ref 3.87–5.11)
RDW: 13.9 % (ref 11.5–15.5)
WBC: 14.1 10*3/uL — AB (ref 4.0–10.5)

## 2014-08-31 LAB — BRAIN NATRIURETIC PEPTIDE: B Natriuretic Peptide: 57 pg/mL (ref 0.0–100.0)

## 2014-08-31 MED ORDER — SODIUM CHLORIDE 0.9 % IV BOLUS (SEPSIS)
1000.0000 mL | Freq: Once | INTRAVENOUS | Status: AC
  Administered 2014-08-31: 1000 mL via INTRAVENOUS

## 2014-08-31 MED ORDER — PREDNISONE 20 MG PO TABS: ORAL_TABLET | ORAL | Status: DC

## 2014-08-31 MED ORDER — AZITHROMYCIN 250 MG PO TABS: ORAL_TABLET | ORAL | Status: DC

## 2014-08-31 MED ORDER — IPRATROPIUM-ALBUTEROL 0.5-2.5 (3) MG/3ML IN SOLN
3.0000 mL | Freq: Once | RESPIRATORY_TRACT | Status: AC
  Administered 2014-08-31: 3 mL via RESPIRATORY_TRACT
  Filled 2014-08-31: qty 3

## 2014-08-31 MED ORDER — METHYLPREDNISOLONE SODIUM SUCC 125 MG IJ SOLR
125.0000 mg | Freq: Once | INTRAMUSCULAR | Status: AC
  Administered 2014-08-31: 125 mg via INTRAVENOUS
  Filled 2014-08-31: qty 2

## 2014-08-31 MED ORDER — ALBUTEROL SULFATE (2.5 MG/3ML) 0.083% IN NEBU
2.5000 mg | INHALATION_SOLUTION | Freq: Once | RESPIRATORY_TRACT | Status: AC
Start: 2014-08-31 — End: 2014-08-31
  Administered 2014-08-31: 2.5 mg via RESPIRATORY_TRACT
  Filled 2014-08-31: qty 3

## 2014-08-31 MED ORDER — AZITHROMYCIN 250 MG PO TABS
500.0000 mg | ORAL_TABLET | Freq: Once | ORAL | Status: AC
  Administered 2014-08-31: 500 mg via ORAL
  Filled 2014-08-31: qty 2

## 2014-08-31 MED ORDER — DEXTROSE 5 % IV SOLN
1.0000 g | Freq: Once | INTRAVENOUS | Status: AC
  Administered 2014-08-31: 1 g via INTRAVENOUS
  Filled 2014-08-31: qty 10

## 2014-08-31 NOTE — Discharge Instructions (Signed)
Follow up with your md this week. °

## 2014-08-31 NOTE — ED Notes (Signed)
Pt states she is currently on antibiotics and steroids for bronchitis x 2 weeks. Pt states productive cough x 2 weeeks, now green and yellow in color.

## 2014-09-02 NOTE — ED Provider Notes (Signed)
CSN: [id number]     Arrival date & time 08/31/14  1834 History   First MD Initiated Contact with Patient 08/31/14 1957     Chief Complaint  Patient presents with  . Cough     (Consider location/radiation/quality/duration/timing/severity/associated sxs/prior Treatment) Patient is a 54 y.o. female presenting with cough. The history is provided by the patient (the pt complains of a cough and sob).  Cough Cough characteristics:  Productive Severity:  Moderate Onset quality:  Gradual Timing:  Constant Progression:  Waxing and waning Chronicity:  Recurrent Context: not animal exposure   Associated symptoms: no chest pain, no eye discharge, no headaches and no rash     Past Medical History  Diagnosis Date  . Palpitations   . 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 Aortic stenosis, severe     Bicuspid valve  . Valvular heart disease   . Migraine headache   . Bronchitis   . COPD (chronic obstructive pulmonary disease)   . CHF (congestive heart failure)   . Asthma   . Aortic aneurysm   . Dysrhythmia     palpitations  . Anxiety   . GERD (gastroesophageal reflux disease)   . Arthritis   . Shortness of breath     Nodule left lung CT done 8/6   Past Surgical History  Procedure Laterality Date  . Sigmoidoscopy  10/13/05, 09/19/05  . Colonoscopy  10/13/05  . Aortic valve replacement    . Cholecystectomy    . Abdominal hysterectomy    . Knee arthroscopy      rt x4  x1 lft  . Tonsillectomy    . Tubal ligation    . Arthroscopic repair acl  rt  . Diagnostic laparoscopy    . Tympanostomy tube placement    . Myringoplasty w/ fat graft      graft from behind ear  . Anterior cervical decomp/discectomy fusion  10/05/2011    Procedure: ANTERIOR CERVICAL DECOMPRESSION/DISCECTOMY FUSION 2 LEVELS;  Surgeon: Floyce Stakes, MD;  Location: [facility];  Service: Neurosurgery;  Laterality: N/A;  Cervical five - six , six - seven Anterior cervical  decompression/diskectomy/fusion/plate   Family History  Problem Relation Age of Onset  . Cancer Mother     Type unknown  . Heart attack Father   . Diabetes      family history  . Asthma Brother   . Hyperlipidemia      family history   History  Substance Use Topics  . Smoking status: Current Some Day Smoker -- 0.10 packs/day for 30 years    Types: Cigarettes  . Smokeless tobacco: Not on file  . Alcohol Use: No   OB History    No data available     Review of Systems  Constitutional: Negative for appetite change and fatigue.  HENT: Negative for congestion, ear discharge and sinus pressure.   Eyes: Negative for discharge.  Respiratory: Positive for cough.   Cardiovascular: Negative for chest pain.  Gastrointestinal: Negative for abdominal pain and diarrhea.  Genitourinary: Negative for frequency and hematuria.  Musculoskeletal: Negative for back pain.  Skin: Negative for rash.  Neurological: Negative for seizures and headaches.  Psychiatric/Behavioral: Negative for hallucinations.      Allergies  Beta adrenergic blockers; Peanut-containing drug products; Shrimp; Avelox; Ciprofloxacin; Cleocin; Moxifloxacin; Sulfamethoxazole-trimethoprim; and Tape  Home Medications   Prior to Admission medications   Medication Sig Start Date End Date Taking? Authorizing Provider  albuterol (PROVENTIL) (2.5 MG/3ML) 0.083%  nebulizer solution Take 2.5 mg by nebulization every 6 (six) hours as needed for wheezing or shortness of breath.   Yes Historical Provider, MD  albuterol (VENTOLIN HFA) 108 (90 BASE) MCG/ACT inhaler Inhale 1 puff into the lungs 4 (four) times daily as needed. For shortness of breath   Yes Historical Provider, MD  ALPRAZolam Duanne Moron) 1 MG tablet Take 1 mg by mouth 4 (four) times daily as needed. For anxiety   Yes Historical Provider, MD  budesonide-formoterol (SYMBICORT) 160-4.5 MCG/ACT inhaler Inhale 2 puffs into the lungs 2 (two) times daily.   Yes Historical Provider,  MD  buPROPion (WELLBUTRIN SR) 150 MG 12 hr tablet Take 150 mg by mouth 2 (two) times daily.   Yes Historical Provider, MD  cephALEXin (KEFLEX) 500 MG capsule Take 500 mg by mouth 3 (three) times daily.   Yes Historical Provider, MD  cetirizine (ZYRTEC) 10 MG tablet Take 10 mg by mouth every morning.    Yes Historical Provider, MD  furosemide (LASIX) 40 MG tablet TAKE (1) TABLET TWICE DAILY. 01/17/14  Yes [name], MD  ibuprofen (ADVIL,MOTRIN) 200 MG tablet Take 600 mg by mouth every 6 (six) hours as needed for mild pain.    Yes Historical Provider, MD  meloxicam (MOBIC) 15 MG tablet Take 15 mg by mouth daily.   Yes Historical Provider, MD  montelukast (SINGULAIR) 10 MG tablet Take 10 mg by mouth at bedtime.    Yes Historical Provider, MD  Olopatadine HCl (PATADAY) 0.2 % SOLN Apply 1 drop to eye as needed.   Yes Historical Provider, MD  omeprazole (PRILOSEC) 20 MG capsule Take 20 mg by mouth 2 (two) times daily before a meal.   Yes Historical Provider, MD  OVER THE COUNTER MEDICATION Place 2 sprays into both nostrils daily. Clarispray   Yes Historical Provider, MD  OxyCODONE (OXYCONTIN) 20 mg T12A 12 hr tablet Take 20 mg by mouth every 12 (twelve) hours.   Yes Historical Provider, MD  oxyCODONE-acetaminophen (PERCOCET) 10-325 MG per tablet Take 1 tablet by mouth 5 (five) times daily as needed for pain. As needed for pain   Yes Historical Provider, MD  potassium chloride (K-DUR) 10 MEQ tablet TAKE 2 TABLETS BY MOUTH TWICE DAILY. 11/13/13  Yes [name], MD  PRESCRIPTION MEDICATION Inject as directed every 30 (thirty) days. Allergy Injections   Yes Historical Provider, MD  simvastatin (ZOCOR) 40 MG tablet Take 1 tablet (40 mg total) by mouth at bedtime. 12/18/13  Yes [name], MD  Tiotropium Bromide Monohydrate (SPIRIVA RESPIMAT) 1.25 MCG/ACT AERS Inhale 2 sprays into the lungs daily.    Yes Historical Provider, MD  azithromycin (ZITHROMAX) 250 MG tablet Take one  pill a day 08/31/14   Milton Ferguson, MD  cephALEXin (KEFLEX) 250 MG capsule Take 1 capsule (250 mg total) by mouth 4 (four) times daily. 01/25/14   Lily Kocher, PA-C  EPINEPHrine (EPIPEN) 0.3 mg/0.3 mL DEVI Inject 0.3 mg into the muscle as needed. For allergic reaction    Historical Provider, MD  predniSONE (DELTASONE) 20 MG tablet 2 tabs po daily x 3 days 08/31/14   Milton Ferguson, MD  SUMAtriptan (IMITREX) 100 MG tablet Take 100 mg by mouth as needed. For migraines    Historical Provider, MD   BP 150/71 mmHg  Pulse 74  Temp(Src) 97.9 F (36.6 C) (Oral)  Resp 16  Ht 5\' 4"  (1.626 m)  Wt 164 lb (74.39 kg)  BMI 28.14 kg/m2  SpO2 100% Physical Exam  Constitutional: She is oriented to person, place, and time. She appears well-developed.  HENT:  Head: Normocephalic.  Eyes: Conjunctivae and EOM are normal. No scleral icterus.  Neck: Neck supple. No thyromegaly present.  Cardiovascular: Normal rate and regular rhythm.  Exam reveals no gallop and no friction rub.   No murmur heard. Pulmonary/Chest: No stridor. She has wheezes. She has no rales. She exhibits no tenderness.  Abdominal: She exhibits no distension. There is no tenderness. There is no rebound.  Musculoskeletal: Normal range of motion. She exhibits no edema.  Lymphadenopathy:    She has no cervical adenopathy.  Neurological: She is oriented to person, place, and time. She exhibits normal muscle tone. Coordination normal.  Skin: No rash noted. No erythema.  Psychiatric: She has a normal mood and affect. Her behavior is normal.    ED Course  Procedures (including critical care time) Labs Review Labs Reviewed  CBC WITH DIFFERENTIAL/PLATELET - Abnormal; Notable for the following:    WBC 14.1 (*)    Neutro Abs 8.3 (*)    Lymphs Abs 4.4 (*)    Monocytes Absolute 1.3 (*)    All other components within normal limits  COMPREHENSIVE METABOLIC PANEL - Abnormal; Notable for the following:    BUN 26 (*)    Calcium 8.2 (*)    Total  Protein 6.2 (*)    All other components within normal limits  BRAIN NATRIURETIC PEPTIDE    Imaging Review Dg Chest 2 View  08/31/2014   CLINICAL DATA:  Two week history of productive cough for which the patient is currently undergoing antibiotic and corticosteroid therapy. Prior aortic valve replacement. Known ascending thoracic aortic aneurysm.  EXAM: CHEST  2 VIEW  COMPARISON:  CTA chest 10/23/2013, 09/05/2012. Chest x-rays 07/24/2013 and earlier.  FINDINGS: Prior sternotomy. Cardiac silhouette normal in size, unchanged. Ectatic ascending thoracic aorta, unchanged in appearance. Thoracic aortic atherosclerosis, unchanged. Hilar and mediastinal contours otherwise unremarkable. Hyperinflation with emphysematous changes throughout both lungs, particularly the upper lobes, unchanged. Prominent bronchovascular markings diffusely and mild to moderate central peribronchial thickening, more so than on the prior examinations. No confluent airspace consolidation. No pleural effusions. No pneumothorax. Degenerative changes involving the thoracic spine.  IMPRESSION: 1. Mild to moderate changes of acute bronchitis and/or asthma superimposed upon COPD/emphysema. 2. Stable ectatic ascending thoracic aorta.   Electronically Signed   By: Evangeline Dakin M.D.   On: 08/31/2014 19:55     EKG Interpretation None      MDM   Final diagnoses:  Bronchitis    Bronchitis and bronchospasm,  tx with zpak and prednisone and follow up     Milton Ferguson, MD 09/02/14 1220

## 2014-09-03 ENCOUNTER — Telehealth: Payer: Self-pay | Admitting: Cardiovascular Disease

## 2014-09-03 NOTE — Telephone Encounter (Signed)
New MEssage  Pt calling about sched an echo and CT- pt is aware there is no order in the syst for the tests and requests to speak w/ RN. Please call back and discuss.

## 2014-09-03 NOTE — Telephone Encounter (Signed)
Spoke with pt. She was last seen in August 2015 with 12 month follow up planned. Pt thought she was due to see Dr. [name] in July and was calling to schedule this appt.  Pt reports she is being treated for bronchitis. Complains of times where she feels heart fluttering and an overwhelming feeling in chest. Reports she doesn't ever feel good.  I offered pt appt tomorrow with Dr. [name] but she would like to wait until August.  Appt made for October 23, 2014 at 9:15.

## 2014-10-22 NOTE — Progress Notes (Addendum)
Chief Complaint  Patient presents with  . Shortness of Breath    History of Present Illness: 54 yo female with history of bicuspid aortic valve s/p Ross procedure in 1998, thoracic aortic aneurysm, O2 dependent COPD, GERD, anxiety, ongoing tobacco abuse here today for cardiac follow up. She has been followed in the past by Dr. [name]. I met her in 2015 for pre-operative evaluation before a knee replacement. Echo 10/23/13 with normal LV systolic function. Moderate AI. Dilated aortic root. CTA 10/23/13 with dilated aortic root at 3.8 x 4.7 cm.   She is here today for follow up. She describes daily SOB. She wears supplemental oxygen during the day. She has mostly stopped smoking but still smoking some when around other smokers. She has rare episodes of mild chest pressure when she is dyspneic. She has some numbness in her arms. Similar to symptoms before cervical spine surgery.     Primary Care Physician: [name]  Last Lipid Profile: Followed in primary care   Past Medical History  Diagnosis Date  . Palpitations   . 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 Aortic stenosis, severe     Bicuspid valve  . Valvular heart disease   . Migraine headache   . Bronchitis   . COPD (chronic obstructive pulmonary disease)   . CHF (congestive heart failure)   . Asthma   . Aortic aneurysm   . Dysrhythmia     palpitations  . Anxiety   . GERD (gastroesophageal reflux disease)   . Arthritis   . Shortness of breath     Nodule left lung CT done 8/6    Past Surgical History  Procedure Laterality Date  . Sigmoidoscopy  10/13/05, 09/19/05  . Colonoscopy  10/13/05  . Aortic valve replacement    . Cholecystectomy    . Abdominal hysterectomy    . Knee arthroscopy      rt x4  x1 lft  . Tonsillectomy    . Tubal ligation    . Arthroscopic repair acl  rt  . Diagnostic laparoscopy    . Tympanostomy tube placement    . Myringoplasty w/ fat graft      graft from behind  ear  . Anterior cervical decomp/discectomy fusion  10/05/2011    Procedure: ANTERIOR CERVICAL DECOMPRESSION/DISCECTOMY FUSION 2 LEVELS;  Surgeon: Floyce Stakes, MD;  Location: [facility];  Service: Neurosurgery;  Laterality: N/A;  Cervical five - six , six - seven Anterior cervical decompression/diskectomy/fusion/plate    Current Outpatient Prescriptions  Medication Sig Dispense Refill  . albuterol (PROVENTIL) (2.5 MG/3ML) 0.083% nebulizer solution Take 2.5 mg by nebulization every 6 (six) hours as needed for wheezing or shortness of breath.    Marland Kitchen albuterol (VENTOLIN HFA) 108 (90 BASE) MCG/ACT inhaler Inhale 1 puff into the lungs 4 (four) times daily as needed. For shortness of breath    . ALPRAZolam (XANAX) 1 MG tablet Take 1 mg by mouth 4 (four) times daily as needed. For anxiety    . budesonide-formoterol (SYMBICORT) 160-4.5 MCG/ACT inhaler Inhale 2 puffs into the lungs 2 (two) times daily.    Marland Kitchen buPROPion (WELLBUTRIN SR) 150 MG 12 hr tablet Take 150 mg by mouth 2 (two) times daily.    . cetirizine (ZYRTEC) 10 MG tablet Take 10 mg by mouth every morning.     Marland Kitchen EPINEPHrine (EPIPEN) 0.3 mg/0.3 mL DEVI Inject 0.3 mg into the muscle as needed. For allergic reaction    .  furosemide (LASIX) 40 MG tablet TAKE (1) TABLET TWICE DAILY. 60 tablet 10  . meloxicam (MOBIC) 15 MG tablet Take 15 mg by mouth daily.    . montelukast (SINGULAIR) 10 MG tablet Take 10 mg by mouth at bedtime.     . Olopatadine HCl (PATADAY) 0.2 % SOLN Apply 1 drop to eye as needed.    Marland Kitchen omeprazole (PRILOSEC) 20 MG capsule Take 20 mg by mouth 2 (two) times daily before a meal.    . OVER THE COUNTER MEDICATION Place 2 sprays into both nostrils daily. Clarispray    . OxyCODONE (OXYCONTIN) 20 mg T12A 12 hr tablet Take 20 mg by mouth every 12 (twelve) hours.    Marland Kitchen oxyCODONE-acetaminophen (PERCOCET) 10-325 MG per tablet Take 1 tablet by mouth 5 (five) times daily as needed for pain. As needed for pain    . potassium chloride (K-DUR) 10  MEQ tablet TAKE 2 TABLETS BY MOUTH TWICE DAILY. 120 tablet 5  . PRESCRIPTION MEDICATION Inject as directed every 30 (thirty) days. Allergy Injections    . simvastatin (ZOCOR) 40 MG tablet Take 1 tablet (40 mg total) by mouth at bedtime. 30 tablet 11  . SUMAtriptan (IMITREX) 100 MG tablet Take 100 mg by mouth as needed. For migraines    . Tiotropium Bromide Monohydrate (SPIRIVA RESPIMAT) 1.25 MCG/ACT AERS Inhale 2 sprays into the lungs daily.      No current facility-administered medications for this visit.    Allergies  Allergen Reactions  . Beta Adrenergic Blockers Anaphylaxis  . Peanut-Containing Drug Products Anaphylaxis, Shortness Of Breath and Swelling    Swelling of the throat  . Shrimp [Shellfish Allergy] Anaphylaxis  . Avelox [Moxifloxacin Hcl In Nacl] Rash  . Ciprofloxacin Nausea And Vomiting  . Cleocin [Clindamycin Hcl] Rash  . Moxifloxacin Rash  . Sulfamethoxazole-Trimethoprim Rash  . Tape     White clear itches, rash    Social History   Social History  . Marital Status: Legally Separated    Spouse Name: N/A  . Number of Children: N/A  . Years of Education: N/A   Occupational History  . Not on file.   Social History Main Topics  . Smoking status: Current Some Day Smoker -- 0.10 packs/day for 30 years    Types: Cigarettes  . Smokeless tobacco: Not on file  . Alcohol Use: No  . Drug Use: No  . Sexual Activity: No   Other Topics Concern  . Not on file   Social History Narrative    Family History  Problem Relation Age of Onset  . Cancer Mother     Type unknown  . Heart attack Father   . Diabetes      family history  . Asthma Brother   . Hyperlipidemia      family history    Review of Systems:  As stated in the HPI and otherwise negative.   BP 128/72 mmHg  Pulse 90  Ht 5' 4" (1.626 m)  Wt 173 lb (78.472 kg)  BMI 29.68 kg/m2  Physical Examination: General: Well developed, well nourished, NAD HEENT: OP clear, mucus membranes moist SKIN:  warm, dry. No rashes. Neuro: No focal deficits Musculoskeletal: Muscle strength 5/5 all ext Psychiatric: Mood and affect normal Neck: No JVD, no carotid bruits, no thyromegaly, no lymphadenopathy. Lungs:Clear bilaterally, no wheezes, rhonci, crackles Cardiovascular: Regular rate and rhythm. Systolic murmur. No gallops or rubs. Abdomen:Soft. Bowel sounds present. Non-tender.  Extremities: No lower extremity edema. Pulses are 2 + in the bilateral  DP/PT.  Echo 10/23/13: Procedure narrative: Transthoracic echocardiography. Image quality was suboptimal. The study was technically difficult, as a result of poor sound wave transmission. - Left ventricle: The cavity size was normal. Wall thickness was increased in a pattern of mild LVH. Systolic function was normal. The estimated ejection fraction was in the range of 50% to 55%. Doppler parameters are consistent with abnormal left ventricular relaxation (grade 1 diastolic dysfunction). - Ventricular septum: Septal motion showed abnormal function and dyssynergy. These changes are consistent with a post-thoracotomy state. - Aortic valve: There was moderate regurgitation. Valve area (VTI): 2.76 cm^2. Valve area (Vmax): 2.67 cm^2. Valve area (Vmean): 2.81 cm^2. Regurgitation pressure half-time: 369 ms. - Aorta: Moderate ascending aortic dilatation. Maximum diameter 4.86 cm. - Mitral valve: Mildly calcified annulus. There was trivial regurgitation. - Pulmonic valve: Mildly thickened leaflets. - Pulmonary arteries: PA peak pressure: 20 mm Hg (S).  Impressions:  - When compared to the report dated 08/29/2011, aortic regurgitation has progressed from mild to moderate. LV systolic function remains normal.  Chest CTA:  10/23/13: Moderate emphysema. Small left upper lobe nodule not well seen today Stable ascending aortic aneurysm measuring 3.8 x 4.7 cm.  EKG:  EKG is not ordered today. The ekg ordered today demonstrates    Recent Labs: 08/31/2014: ALT 18; B Natriuretic Peptide 57.0; BUN 26*; Creatinine, Ser 0.65; Hemoglobin 13.8; Platelets 318; Potassium 3.7; Sodium 141   Lipid Panel    Component Value Date/Time   CHOL 150 03/25/2014 0947   TRIG 257.0* 03/25/2014 0947   HDL 35.90* 03/25/2014 0947   CHOLHDL 4 03/25/2014 0947   VLDL 51.4* 03/25/2014 0947   LDLCALC 116* 06/01/2011 1330   LDLDIRECT 79.0 03/25/2014 0947     Wt Readings from Last 3 Encounters:  10/23/14 173 lb (78.472 kg)  08/31/14 164 lb (74.39 kg)  01/25/14 165 lb (74.844 kg)     Other studies Reviewed: Additional studies/ records that were reviewed today include: . Review of the above records demonstrates:    Assessment and Plan:   1. Aortic valve disease: s/p Ross procedure. Appears stable by echo August 2015. Systolic murmur on exam. Will repeat echo now.   2. Thoracic aortic aneurysm: Stable by CTA 2015. Will repeat CTA now. BMET today.   3. Tobacco abuse, ongoing: She is trying to stop smoking. She is doing well and only smoking several cigarettes per day. 10 minutes spent counseling on smoking cessation.    4. Pulmonary nodules: Appears smaller on CT chest 2015. Repeat CT chest now.   Current medicines are reviewed at length with the patient today.  The patient does not have concerns regarding medicines.  The following changes have been made:  no change  Labs/ tests ordered today include:   Orders Placed This Encounter  Procedures  . CT ANGIO CHEST AORTA W/CM &/OR WO/CM  . Basic Metabolic Panel (BMET)  . Lipid Profile  . Hepatic function panel  . Echocardiogram    Disposition:   FU with me in 12 months  Signed, Lauree Chandler, MD 10/23/2014 10:19 AM    [facility] [place], [place], [place]  [address] Phone: [phone]; Fax: [phone]

## 2014-10-23 ENCOUNTER — Encounter: Payer: Self-pay | Admitting: Cardiovascular Disease

## 2014-10-23 ENCOUNTER — Ambulatory Visit (INDEPENDENT_AMBULATORY_CARE_PROVIDER_SITE_OTHER): Payer: Medicaid Other | Admitting: Cardiovascular Disease

## 2014-10-23 VITALS — BP 128/72 | HR 90 | Ht 64.0 in | Wt 173.0 lb

## 2014-10-23 DIAGNOSIS — I712 Thoracic aortic aneurysm, without rupture, unspecified: Secondary | ICD-10-CM

## 2014-10-23 DIAGNOSIS — Z720 Tobacco use: Secondary | ICD-10-CM | POA: Diagnosis not present

## 2014-10-23 DIAGNOSIS — F17200 Nicotine dependence, unspecified, uncomplicated: Secondary | ICD-10-CM

## 2014-10-23 DIAGNOSIS — E785 Hyperlipidemia, unspecified: Secondary | ICD-10-CM | POA: Diagnosis not present

## 2014-10-23 DIAGNOSIS — I359 Nonrheumatic aortic valve disorder, unspecified: Secondary | ICD-10-CM

## 2014-10-23 LAB — BASIC METABOLIC PANEL
BUN: 22 mg/dL (ref 6–23)
CALCIUM: 9.1 mg/dL (ref 8.4–10.5)
CO2: 30 mEq/L (ref 19–32)
CREATININE: 0.8 mg/dL (ref 0.40–1.20)
Chloride: 103 mEq/L (ref 96–112)
GFR: 79.65 mL/min (ref >60.00)
Glucose, Bld: 77 mg/dL (ref 70–99)
Potassium: 4.8 mEq/L (ref 3.5–5.1)
SODIUM: 138 meq/L (ref 135–145)

## 2014-10-23 NOTE — Patient Instructions (Addendum)
Medication Instructions:  Your physician recommends that you continue on your current medications as directed. Please refer to the Current Medication list given to you today.   Labwork: Lab work to be done today--BMP Your physician recommends that you return for fasting lab work in: January 2017 --Lipid and liver profiles   Testing/Procedures: Your physician has requested that you have an echocardiogram. Echocardiography is a painless test that uses sound waves to create images of your heart. It provides your doctor with information about the size and shape of your heart and how well your heart's chambers and valves are working. This procedure takes approximately one hour. There are no restrictions for this procedure.  Non-Cardiac CT Angiography (CTA), is a special type of CT scan that uses a computer to produce multi-dimensional views of major blood vessels throughout the body. In CT angiography, a contrast material is injected through an IV to help visualize the blood vessels  Follow-Up: Your physician wants you to follow-up in: 12 months.  You will receive a reminder letter in the mail two months in advance. If you don't receive a letter, please call our office to schedule the follow-up appointment.

## 2014-10-29 ENCOUNTER — Ambulatory Visit (HOSPITAL_COMMUNITY): Payer: Medicaid Other | Attending: Cardiovascular Disease

## 2014-10-29 ENCOUNTER — Other Ambulatory Visit: Payer: Self-pay

## 2014-10-29 ENCOUNTER — Ambulatory Visit (INDEPENDENT_AMBULATORY_CARE_PROVIDER_SITE_OTHER)
Admission: RE | Admit: 2014-10-29 | Discharge: 2014-10-29 | Disposition: A | Discharge status: Home / Self Care | Payer: Medicaid Other | Source: Ambulatory Visit | Attending: Cardiovascular Disease | Admitting: Cardiovascular Disease

## 2014-10-29 DIAGNOSIS — I712 Thoracic aortic aneurysm, without rupture, unspecified: Secondary | ICD-10-CM

## 2014-10-29 DIAGNOSIS — I340 Nonrheumatic mitral (valve) insufficiency: Secondary | ICD-10-CM | POA: Insufficient documentation

## 2014-10-29 DIAGNOSIS — I358 Other nonrheumatic aortic valve disorders: Secondary | ICD-10-CM | POA: Diagnosis present

## 2014-10-29 DIAGNOSIS — I77819 Aortic ectasia, unspecified site: Secondary | ICD-10-CM | POA: Insufficient documentation

## 2014-10-29 DIAGNOSIS — I351 Nonrheumatic aortic (valve) insufficiency: Secondary | ICD-10-CM | POA: Diagnosis not present

## 2014-10-29 DIAGNOSIS — I517 Cardiomegaly: Secondary | ICD-10-CM | POA: Diagnosis not present

## 2014-10-29 DIAGNOSIS — I359 Nonrheumatic aortic valve disorder, unspecified: Secondary | ICD-10-CM

## 2014-10-29 MED ORDER — IOHEXOL 350 MG/ML SOLN
100.0000 mL | Freq: Once | INTRAVENOUS | Status: AC | PRN
  Administered 2014-10-29: 100 mL via INTRAVENOUS

## 2014-11-06 ENCOUNTER — Other Ambulatory Visit: Payer: Self-pay | Admitting: *Deleted

## 2014-11-06 DIAGNOSIS — I359 Nonrheumatic aortic valve disorder, unspecified: Secondary | ICD-10-CM

## 2014-12-25 ENCOUNTER — Other Ambulatory Visit: Payer: Self-pay | Admitting: Cardiovascular Disease

## 2015-01-30 ENCOUNTER — Other Ambulatory Visit: Payer: Self-pay | Admitting: Cardiovascular Disease

## 2015-03-25 ENCOUNTER — Other Ambulatory Visit: Payer: Medicaid Other

## 2015-04-06 ENCOUNTER — Ambulatory Visit (INDEPENDENT_AMBULATORY_CARE_PROVIDER_SITE_OTHER): Payer: Medicaid Other | Admitting: Cardiovascular Disease

## 2015-04-06 ENCOUNTER — Encounter: Payer: Self-pay | Admitting: Cardiovascular Disease

## 2015-04-06 VITALS — BP 122/84 | HR 80 | Ht 64.0 in | Wt 170.6 lb

## 2015-04-06 DIAGNOSIS — I712 Thoracic aortic aneurysm, without rupture, unspecified: Secondary | ICD-10-CM

## 2015-04-06 DIAGNOSIS — I359 Nonrheumatic aortic valve disorder, unspecified: Secondary | ICD-10-CM | POA: Diagnosis not present

## 2015-04-06 DIAGNOSIS — R002 Palpitations: Secondary | ICD-10-CM

## 2015-04-06 DIAGNOSIS — F17201 Nicotine dependence, unspecified, in remission: Secondary | ICD-10-CM

## 2015-04-06 DIAGNOSIS — E785 Hyperlipidemia, unspecified: Secondary | ICD-10-CM | POA: Diagnosis not present

## 2015-04-06 NOTE — Patient Instructions (Signed)
Medication Instructions:  Your physician recommends that you continue on your current medications as directed. Please refer to the Current Medication list given to you today.   Labwork: Your physician recommends that you return for fasting lab work on day of echo --Lipid and liver profiles (originally scheduled for January 25)   Testing/Procedures:  Your physician has recommended that you wear a holter monitor. Holter monitors are medical devices that record the heart's electrical activity. Doctors most often use these monitors to diagnose arrhythmias. Arrhythmias are problems with the speed or rhythm of the heartbeat. The monitor is a small, portable device. You can wear one while you do your normal daily activities. This is usually used to diagnose what is causing palpitations/syncope (passing out).   Your physician has requested that you have an echocardiogram. Echocardiography is a painless test that uses sound waves to create images of your heart. It provides your doctor with information about the size and shape of your heart and how well your heart's chambers and valves are working. This procedure takes approximately one hour. There are no restrictions for this procedure.  Already scheduled for March 8.  Please move to earlier date    Follow-Up: Your physician wants you to follow-up in: 6 months.  You will receive a reminder letter in the mail two months in advance. If you don't receive a letter, please call our office to schedule the follow-up appointment. We will make referral to Dr. Cyndia Bent.   Any Other Special Instructions Will Be Listed Below (If Applicable).     If you need a refill on your cardiac medications before your next appointment, please call your pharmacy.

## 2015-04-06 NOTE — Progress Notes (Signed)
Chief Complaint  Patient presents with  . Follow-up    pt c/o chest pain and SOB    History of Present Illness: 55 yo female with history of bicuspid aortic valve s/p Ross procedure in 1998, thoracic aortic aneurysm, O2 dependent COPD, GERD, anxiety, ongoing tobacco abuse here today for cardiac follow up. She has been followed in the past by Dr. [name]. I met her in 2015 for pre-operative evaluation before a knee replacement. Echo 10/23/13 with normal LV systolic function. Moderate AI. Dilated aortic root. CTA 10/23/13 with dilated aortic root at 3.8 x 4.7 cm.   She is here today for follow up. She describes daily SOB. She wears supplemental oxygen during the day. She has stopped smoking. She has rare episodes of mild chest pressure when she is dyspneic. She describes   O2 sats on room air 96%, walking on RA 94%, walking on O2 96%.   Primary Care Physician: [name]  Last Lipid Profile: Followed in primary care   Past Medical History  Diagnosis Date  . Palpitations   . 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 Aortic stenosis, severe     Bicuspid valve  . Valvular heart disease   . Migraine headache   . Bronchitis   . COPD (chronic obstructive pulmonary disease) ([place])   . CHF (congestive heart failure) ([place])   . Asthma   . Aortic aneurysm ([place])   . Dysrhythmia     palpitations  . Anxiety   . GERD (gastroesophageal reflux disease)   . Arthritis   . Shortness of breath     Nodule left lung CT done 8/6    Past Surgical History  Procedure Laterality Date  . Sigmoidoscopy  10/13/05, 09/19/05  . Colonoscopy  10/13/05  . Aortic valve replacement    . Cholecystectomy    . Abdominal hysterectomy    . Knee arthroscopy      rt x4  x1 lft  . Tonsillectomy    . Tubal ligation    . Arthroscopic repair acl  rt  . Diagnostic laparoscopy    . Tympanostomy tube placement    . Myringoplasty w/ fat graft      graft from behind ear  . Anterior cervical  decomp/discectomy fusion  10/05/2011    Procedure: ANTERIOR CERVICAL DECOMPRESSION/DISCECTOMY FUSION 2 LEVELS;  Surgeon: Floyce Stakes, MD;  Location: [facility];  Service: Neurosurgery;  Laterality: N/A;  Cervical five - six , six - seven Anterior cervical decompression/diskectomy/fusion/plate    Current Outpatient Prescriptions  Medication Sig Dispense Refill  . cyclobenzaprine (FLEXERIL) 10 MG tablet Take 10 mg by mouth 3 (three) times daily as needed for muscle spasms.    Marland Kitchen docusate sodium (COLACE) 100 MG capsule Take 100 mg by mouth 2 (two) times daily as needed for mild constipation.    Marland Kitchen oxymorphone (OPANA) 10 MG tablet Take 10 mg by mouth every 8 (eight) hours as needed for pain.    Marland Kitchen albuterol (PROVENTIL) (2.5 MG/3ML) 0.083% nebulizer solution Take 2.5 mg by nebulization every 6 (six) hours as needed for wheezing or shortness of breath.    Marland Kitchen albuterol (VENTOLIN HFA) 108 (90 BASE) MCG/ACT inhaler Inhale 1 puff into the lungs 4 (four) times daily as needed. For shortness of breath    . ALPRAZolam (XANAX) 1 MG tablet Take 1 mg by mouth 4 (four) times daily as needed. For anxiety    . budesonide-formoterol (SYMBICORT) 160-4.5 MCG/ACT inhaler  Inhale 2 puffs into the lungs 2 (two) times daily.    Marland Kitchen buPROPion (WELLBUTRIN SR) 150 MG 12 hr tablet Take 150 mg by mouth 2 (two) times daily.    . cetirizine (ZYRTEC) 10 MG tablet Take 10 mg by mouth every morning.     Marland Kitchen EPINEPHrine (EPIPEN) 0.3 mg/0.3 mL DEVI Inject 0.3 mg into the muscle as needed. For allergic reaction    . furosemide (LASIX) 40 MG tablet TAKE (1) TABLET TWICE DAILY. 60 tablet 10  . meloxicam (MOBIC) 15 MG tablet Take 15 mg by mouth daily.    . montelukast (SINGULAIR) 10 MG tablet Take 10 mg by mouth at bedtime.     . Olopatadine HCl (PATADAY) 0.2 % SOLN Apply 1 drop to eye as needed.    Marland Kitchen omeprazole (PRILOSEC) 20 MG capsule Take 20 mg by mouth 2 (two) times daily before a meal.    . OVER THE COUNTER MEDICATION Place 2 sprays  into both nostrils daily. Clarispray    . OxyCODONE (OXYCONTIN) 20 mg T12A 12 hr tablet Take 20 mg by mouth every 12 (twelve) hours.    Marland Kitchen oxyCODONE-acetaminophen (PERCOCET) 10-325 MG per tablet Take 1 tablet by mouth 5 (five) times daily as needed for pain. As needed for pain    . potassium chloride (K-DUR) 10 MEQ tablet TAKE 2 TABLETS BY MOUTH TWICE DAILY. 120 tablet 7  . PRESCRIPTION MEDICATION Inject as directed every 30 (thirty) days. Allergy Injections    . simvastatin (ZOCOR) 40 MG tablet TAKE 1 TABLET BY MOUTH AT BEDTIME. 30 tablet 9  . SUMAtriptan (IMITREX) 100 MG tablet Take 100 mg by mouth as needed. For migraines    . Tiotropium Bromide Monohydrate (SPIRIVA RESPIMAT) 1.25 MCG/ACT AERS Inhale 2 sprays into the lungs daily.      No current facility-administered medications for this visit.    Allergies  Allergen Reactions  . Beta Adrenergic Blockers Anaphylaxis  . Peanut-Containing Drug Products Anaphylaxis, Shortness Of Breath and Swelling    Swelling of the throat  . Shrimp [Shellfish Allergy] Anaphylaxis  . Avelox [Moxifloxacin Hcl In Nacl] Rash  . Ciprofloxacin Nausea And Vomiting  . Cleocin [Clindamycin Hcl] Rash  . Moxifloxacin Rash  . Sulfamethoxazole-Trimethoprim Rash  . Tape     White clear itches, rash    Social History   Social History  . Marital Status: Legally Separated    Spouse Name: N/A  . Number of Children: N/A  . Years of Education: N/A   Occupational History  . Not on file.   Social History Main Topics  . Smoking status: Former Smoker -- 0.10 packs/day for 30 years    Types: Cigarettes  . Smokeless tobacco: Not on file  . Alcohol Use: No  . Drug Use: No  . Sexual Activity: No   Other Topics Concern  . Not on file   Social History Narrative    Family History  Problem Relation Age of Onset  . Cancer Mother     Type unknown  . Heart attack Father   . Diabetes      family history  . Asthma Brother   . Hyperlipidemia      family  history    Review of Systems:  As stated in the HPI and otherwise negative.   BP 122/84 mmHg  Pulse 80  Ht _0  (1.626 m)  Wt 170 lb 9.6 oz (77.384 kg)  BMI 29.27 kg/m2  SpO2 94%  Physical Examination: General: Well developed, well  nourished, NAD HEENT: OP clear, mucus membranes moist SKIN: warm, dry. No rashes. Neuro: No focal deficits Musculoskeletal: Muscle strength 5/5 all ext Psychiatric: Mood and affect normal Neck: No JVD, no carotid bruits, no thyromegaly, no lymphadenopathy. Lungs:Clear bilaterally, no wheezes, rhonci, crackles Cardiovascular: Regular rate and rhythm. Systolic murmur. No gallops or rubs. Abdomen:Soft. Bowel sounds present. Non-tender.  Extremities: No lower extremity edema. Pulses are 2 + in the bilateral DP/PT.  Echo 10/29/14: Left ventricle: The cavity size was normal. Wall thickness was normal. Systolic function was normal. The estimated ejection fraction was in the range of 50% to 55%. Left ventricular diastolic function parameters were normal. - Ventricular septum: Septal motion showed paradox. These changes are consistent with a post-thoracotomy state. 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 There was moderate to severe regurgitation directed centrally in the LVOT. Valve area (VTI): 2.89 cm^2. Valve area (Vmax): 3.02 cm^2. Valve area (Vmean): 2.48 cm^2. - Ascending aorta: The ascending aorta was moderately dilated. - Mitral valve: Calcified annulus. There was mild regurgitation. - Left atrium: The atrium was mildly dilated. - Pulmonic valve: Pulmonic homograft with normal function, minimally elevated velocities and no significant regurgitation. Peak gradient (S): 16 mm Hg.  Impressions:  - Aortic insufficiency appears to have worsened further compared to October 23, 2013 study.  Chest CTA  10/29/14: Mediastinum/Nodes: No supraclavicular adenopathy. Status post Ross procedure. Normal caliber great vessels. tortuosity of the thoracic aorta. Transverse measurement of the ascending aorta on the prior exam is artificially enlarged secondary to the tortuosity. Today, this measures 4.6 x 4.0 on image 40 of series 4. 4.7 x 3.8 cm on the prior exam. Based on coronal reformatted images, the aorta measures 3.1 cm at the level of the aortic valve, 3.8 cm just above the aortic valve, and 3.7 cm within the more distal ascending aorta. Example coronal image 37. When remeasured at the same level on the prior exam, the measurements are 2.9 cm, 3.4 cm, and 3.8 cm respectively. This suggests relative stability. Normal caliber of the transverse and descending segments.  Mild cardiomegaly with prior median sternotomy. No central pulmonary embolism, on this non-dedicated study. No mediastinal hematoma to suggest hemorrhage. No mediastinal or hilar adenopathy.  Lungs/Pleura: No pleural fluid. Minimal motion degradation. Lower lobe predominant bronchial wall thickening. Advanced centrilobular emphysema.  3 mm subpleural left lower lobe pulmonary nodule on image 55 of series 5 is unchanged and likely a subpleural lymph node. No lobar consolidation.  Upper abdomen: Normal imaged portions of the liver, spleen, stomach, pancreas, adrenal glands.  Musculoskeletal: Lower cervical spine fixation.  Review of the MIP images confirms the above findings.  IMPRESSION: 1. Similar appearance of ascending aortic dilatation and tortuosity. 2. Advanced centrilobular emphysema.   EKG:  EKG is not ordered today. The ekg ordered today demonstrates   Recent Labs: 08/31/2014: ALT 18; B Natriuretic Peptide 57.0; Hemoglobin 13.8; Platelets 318 10/23/2014: BUN 22; Creatinine, Ser 0.80; Potassium 4.8; Sodium 138   Lipid Panel    Component Value Date/Time   CHOL 150 03/25/2014 0947   TRIG 257.0*  03/25/2014 0947   HDL 35.90* 03/25/2014 0947   CHOLHDL 4 03/25/2014 0947   VLDL 51.4* 03/25/2014 0947   LDLCALC 116* 06/01/2011 1330   LDLDIRECT 79.0 03/25/2014 0947     Wt Readings from Last 3 Encounters:  04/06/15 170 lb 9.6 oz (77.384 kg)  10/23/14 173 lb (78.472 kg)  08/31/14 164 lb (  74.39 kg)     Other studies Reviewed: Additional studies/ records that were reviewed today include: . Review of the above records demonstrates:    Assessment and Plan:   1. Aortic valve disease: s/p Ross procedure. Appears stable by echo August 2016 however she does have at least moderate AI. Repeat echo now.    2. Thoracic aortic aneurysm: Stable by CTA August 2016. I will go ahead and refer to see Dr. Cyndia Bent and get a surgical opinion. She will need R/L heart cath before any surgical procedure but I will plan this after her initial surgical consultation.   3. Tobacco abuse, in remission: She is trying to stop smoking. She is doing well and only smoking several cigarettes per day. 10 minutes spent counseling on smoking cessation.    4. Pulmonary nodules: Appears smaller on CT chest 2016.   5. Palpitations: This occurs at night. Will arrange 48 hour monitor.   6. HLD: on statin. Needs repeat lipids and LFTs now.   See O2 sats above.   Current medicines are reviewed at length with the patient today.  The patient does not have concerns regarding medicines.  The following changes have been made:  no change  Labs/ tests ordered today include:   Orders Placed This Encounter  Procedures  . Ambulatory referral to Cardiothoracic Surgery  . Holter monitor - 48 hour    Disposition:   FU with me in 12 months  Signed, Lauree Chandler, MD 04/06/2015 11:15 AM    [facility] [place], [place], [place]  [address] Phone: [phone]; Fax: [phone]

## 2015-04-16 ENCOUNTER — Other Ambulatory Visit (INDEPENDENT_AMBULATORY_CARE_PROVIDER_SITE_OTHER): Payer: Medicaid Other | Admitting: *Deleted

## 2015-04-16 ENCOUNTER — Ambulatory Visit (HOSPITAL_COMMUNITY): Payer: Medicaid Other | Attending: Internal Medicine

## 2015-04-16 ENCOUNTER — Other Ambulatory Visit: Payer: Self-pay

## 2015-04-16 ENCOUNTER — Ambulatory Visit (INDEPENDENT_AMBULATORY_CARE_PROVIDER_SITE_OTHER): Payer: Medicaid Other

## 2015-04-16 DIAGNOSIS — I517 Cardiomegaly: Secondary | ICD-10-CM | POA: Diagnosis not present

## 2015-04-16 DIAGNOSIS — I371 Nonrheumatic pulmonary valve insufficiency: Secondary | ICD-10-CM | POA: Diagnosis not present

## 2015-04-16 DIAGNOSIS — I071 Rheumatic tricuspid insufficiency: Secondary | ICD-10-CM | POA: Insufficient documentation

## 2015-04-16 DIAGNOSIS — R002 Palpitations: Secondary | ICD-10-CM

## 2015-04-16 DIAGNOSIS — I2510 Atherosclerotic heart disease of native coronary artery without angina pectoris: Secondary | ICD-10-CM

## 2015-04-16 DIAGNOSIS — I359 Nonrheumatic aortic valve disorder, unspecified: Secondary | ICD-10-CM | POA: Diagnosis present

## 2015-04-16 DIAGNOSIS — I77810 Thoracic aortic ectasia: Secondary | ICD-10-CM | POA: Insufficient documentation

## 2015-04-16 DIAGNOSIS — I351 Nonrheumatic aortic (valve) insufficiency: Secondary | ICD-10-CM | POA: Diagnosis not present

## 2015-04-16 DIAGNOSIS — I340 Nonrheumatic mitral (valve) insufficiency: Secondary | ICD-10-CM | POA: Insufficient documentation

## 2015-04-16 LAB — HEPATIC FUNCTION PANEL
ALK PHOS: 77 U/L (ref 33–130)
ALT: 17 U/L (ref 6–29)
AST: 19 U/L (ref 10–35)
Albumin: 4 g/dL (ref 3.6–5.1)
Bilirubin, Direct: 0.2 mg/dL (ref <=0.2)
Indirect Bilirubin: 0.5 mg/dL (ref 0.2–1.2)
TOTAL PROTEIN: 6.2 g/dL (ref 6.1–8.1)
Total Bilirubin: 0.7 mg/dL (ref 0.2–1.2)

## 2015-04-16 LAB — LIPID PANEL
Cholesterol: 139 mg/dL (ref 125–200)
HDL: 45 mg/dL — AB (ref >=46)
LDL CALC: 67 mg/dL (ref <130)
Total CHOL/HDL Ratio: 3.1 Ratio (ref <=5.0)
Triglycerides: 133 mg/dL (ref <150)
VLDL: 27 mg/dL (ref <30)

## 2015-04-16 NOTE — Addendum Note (Signed)
Addended by: Eulis Foster on: 04/16/2015 07:54 AM   Modules accepted: Orders

## 2015-04-21 ENCOUNTER — Institutional Professional Consult (permissible substitution) (INDEPENDENT_AMBULATORY_CARE_PROVIDER_SITE_OTHER): Payer: Medicaid Other | Admitting: Surgery

## 2015-04-21 ENCOUNTER — Encounter: Payer: Self-pay | Admitting: Surgery

## 2015-04-21 VITALS — BP 134/85 | HR 92 | Resp 20 | Ht 64.0 in | Wt 170.0 lb

## 2015-04-21 DIAGNOSIS — I351 Nonrheumatic aortic (valve) insufficiency: Secondary | ICD-10-CM | POA: Diagnosis not present

## 2015-04-22 ENCOUNTER — Encounter: Payer: Self-pay | Admitting: Surgery

## 2015-04-22 NOTE — Progress Notes (Signed)
Cardiothoracic Surgery Consultation   PCP is HAWKINS,EDWARD Carlean Jews, MD Referring Provider is [name]*  Chief Complaint  Patient presents with  . Thoracic Aortic Aneurysm    Surgical eval, ECHO 04/16/15, HX of bicuspid aortic valve s/p Ross procedure in 1998  . Aortic Insuffiency    HPI:  The patient is a 55 year old woman with a history of oxygen dependent COPD, degenerative spine arthritis with chronic pain on multiple pain meds, anxiety, and bicuspid aortic valve disease who underwent Ross procedure in 1998 at [facility] by Dr. [name]. She was followed in the past by Dr. [name] and then by Dr. [name] since 2015 when she was evaluated for a knee replacement. An echo at that time showed normal LV systolic function with moderate AI and a dilated aortic root. A CTA showed a dilated aortic root with a diameter of 3.8 x 4.7 cm. Her most recent echo on 04/16/2015 showed moderate to severe AI with no stenosis. Her LVEF was 55-60% with normal cavity size. Her aortic root is 4.8 cm.  She wears oxygen during the day. She no longer smokes. She reports daily shortness of breath with exertion which has been chronic. She has had rare episodes of chest pressure associated with the shortness of breath. She currently has a URI which has been going on for a few weeks.    Past Medical History  Diagnosis Date  . Palpitations   . 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 Aortic stenosis, severe     Bicuspid valve  . Valvular heart disease   . Migraine headache   . Bronchitis   . COPD (chronic obstructive pulmonary disease) ([place])   . CHF (congestive heart failure) ([place])   . Asthma   . Aortic aneurysm ([place])   . Dysrhythmia     palpitations  . Anxiety   . GERD (gastroesophageal reflux disease)   . Arthritis   . Shortness of breath     Nodule left lung CT done 8/6    Past Surgical History  Procedure Laterality Date  . Sigmoidoscopy  10/13/05, 09/19/05  .  Colonoscopy  10/13/05  . Aortic valve replacement    . Cholecystectomy    . Abdominal hysterectomy    . Knee arthroscopy      rt x4  x1 lft  . Tonsillectomy    . Tubal ligation    . Arthroscopic repair acl  rt  . Diagnostic laparoscopy    . Tympanostomy tube placement    . Myringoplasty w/ fat graft      graft from behind ear  . Anterior cervical decomp/discectomy fusion  10/05/2011    Procedure: ANTERIOR CERVICAL DECOMPRESSION/DISCECTOMY FUSION 2 LEVELS;  Surgeon: Floyce Stakes, MD;  Location: [facility];  Service: Neurosurgery;  Laterality: N/A;  Cervical five - six , six - seven Anterior cervical decompression/diskectomy/fusion/plate    Family History  Problem Relation Age of Onset  . Cancer Mother     Type unknown  . Heart attack Father   . Diabetes      family history  . Asthma Brother   . Hyperlipidemia      family history    Social History Social History  Substance Use Topics  . Smoking status: Former Smoker -- 0.10 packs/day for 30 years    Types: Cigarettes  . Smokeless tobacco: None  . Alcohol Use: No    Current Outpatient Prescriptions  Medication Sig Dispense Refill  .  albuterol (PROVENTIL) (2.5 MG/3ML) 0.083% nebulizer solution Take 2.5 mg by nebulization every 6 (six) hours as needed for wheezing or shortness of breath.    Marland Kitchen albuterol (VENTOLIN HFA) 108 (90 BASE) MCG/ACT inhaler Inhale 1 puff into the lungs 4 (four) times daily as needed. For shortness of breath    . ALPRAZolam (XANAX) 1 MG tablet Take 1 mg by mouth 4 (four) times daily as needed. For anxiety    . budesonide-formoterol (SYMBICORT) 160-4.5 MCG/ACT inhaler Inhale 2 puffs into the lungs 2 (two) times daily.    Marland Kitchen buPROPion (WELLBUTRIN SR) 150 MG 12 hr tablet Take 150 mg by mouth 2 (two) times daily.    . cetirizine (ZYRTEC) 10 MG tablet Take 10 mg by mouth every morning.     . cyclobenzaprine (FLEXERIL) 10 MG tablet Take 10 mg by mouth 3 (three) times daily as needed for muscle spasms.    Marland Kitchen  docusate sodium (COLACE) 100 MG capsule Take 100 mg by mouth 2 (two) times daily as needed for mild constipation.    Marland Kitchen EPINEPHrine (EPIPEN) 0.3 mg/0.3 mL DEVI Inject 0.3 mg into the muscle as needed. For allergic reaction    . furosemide (LASIX) 40 MG tablet TAKE (1) TABLET TWICE DAILY. 60 tablet 10  . meloxicam (MOBIC) 15 MG tablet Take 15 mg by mouth daily.    . montelukast (SINGULAIR) 10 MG tablet Take 10 mg by mouth at bedtime.     . Olopatadine HCl (PATADAY) 0.2 % SOLN Apply 1 drop to eye as needed.    Marland Kitchen omeprazole (PRILOSEC) 20 MG capsule Take 20 mg by mouth 2 (two) times daily before a meal.    . OVER THE COUNTER MEDICATION Place 2 sprays into both nostrils daily. Clarispray    . oxyCODONE-acetaminophen (PERCOCET) 10-325 MG per tablet Take 1 tablet by mouth 5 (five) times daily as needed for pain. As needed for pain    . oxymorphone (OPANA) 10 MG tablet Take 10 mg by mouth every 8 (eight) hours as needed for pain.    . potassium chloride (K-DUR) 10 MEQ tablet TAKE 2 TABLETS BY MOUTH TWICE DAILY. 120 tablet 7  . PRESCRIPTION MEDICATION Inject as directed every 30 (thirty) days. Allergy Injections    . simvastatin (ZOCOR) 40 MG tablet TAKE 1 TABLET BY MOUTH AT BEDTIME. 30 tablet 9  . SUMAtriptan (IMITREX) 100 MG tablet Take 100 mg by mouth as needed. For migraines    . Tiotropium Bromide Monohydrate (SPIRIVA RESPIMAT) 1.25 MCG/ACT AERS Inhale 2 sprays into the lungs daily.      No current facility-administered medications for this visit.    Allergies  Allergen Reactions  . Beta Adrenergic Blockers Anaphylaxis  . Peanut-Containing Drug Products Anaphylaxis, Shortness Of Breath and Swelling    Swelling of the throat  . Shrimp [Shellfish Allergy] Anaphylaxis  . Avelox [Moxifloxacin Hcl In Nacl] Rash  . Ciprofloxacin Nausea And Vomiting  . Cleocin [Clindamycin Hcl] Rash  . Moxifloxacin Rash  . Sulfamethoxazole-Trimethoprim Rash  . Tape     White clear itches, rash    Review of  Systems  Constitutional: Positive for fatigue. Negative for fever, chills, activity change, appetite change and unexpected weight change.  HENT: Negative for dental problem.        Last saw her dentist 10/2014  Eyes: Negative.   Respiratory: Positive for cough, chest tightness, shortness of breath and wheezing.   Cardiovascular: Positive for chest pain, palpitations and leg swelling.  Gastrointestinal: Positive for abdominal pain  and constipation.       Reflux and hiatal hernia  Endocrine: Negative.   Genitourinary:       H/o UTI  Musculoskeletal: Positive for arthralgias.  Skin: Negative.   Allergic/Immunologic: Negative.   Neurological: Positive for numbness and headaches.       Chronic pain, memory problems  Hematological: Bruises/bleeds easily.  Psychiatric/Behavioral: Positive for dysphoric mood. The patient is nervous/anxious.     BP 134/85 mmHg  Pulse 92  Resp 20  Ht 5\' 4"  (1.626 m)  Wt 170 lb (77.111 kg)  BMI 29.17 kg/m2  SpO2 97% Physical Exam  Constitutional: She is oriented to person, place, and time. She appears well-developed and well-nourished. No distress.  HENT:  Head: Atraumatic.  Mouth/Throat: Oropharynx is clear and moist.  Eyes: EOM are normal. Pupils are equal, round, and reactive to light.  Neck: Normal range of motion. Neck supple. No JVD present. No thyromegaly present.  Cardiovascular: Normal rate, regular rhythm and intact distal pulses.   Murmur heard. 3/6 diastolic murmur at apex.  Pulmonary/Chest: Effort normal. No respiratory distress. She has no wheezes. She has no rales.  Decreased breath sounds throughout.  Abdominal: Soft. Bowel sounds are normal. She exhibits no distension and no mass. There is no tenderness.  Musculoskeletal: Normal range of motion. She exhibits no edema.  Lymphadenopathy:    She has no cervical adenopathy.  Neurological: She is alert and oriented to person, place, and time. She has normal strength. No cranial nerve  deficit or sensory deficit.  Skin: Skin is warm and dry.  Psychiatric:  anxious     Diagnostic Tests:              [facility]            [address], [place] [address]              [phone]  ------------------------------------------------------------------- Transthoracic Echocardiography  Patient:  Trenyce, Kammerdiener MR #:    [id number] Study Date: 04/16/2015 Gender:   F Age:    52 Height:   162.6 cm Weight:   77.1 kg BSA:    1.89 m^2 Pt. Status: Room:  ATTENDING  Sonny Dandy, [place] SONOGRAPHER Marygrace Drought, R[facility] PERFORMING  [facility], Outpatient  cc:  ------------------------------------------------------------------- LV EF: 55% -  60%  ------------------------------------------------------------------- Indications:   Aortic Valve Disorder (I35.9).  ------------------------------------------------------------------- History:  PMH: History of Ross procedure (1998, Pulmonic Valve Homograft), Thoracic Aortic Aneurysm.  ------------------------------------------------------------------- Study Conclusions  - Left ventricle: The cavity size was normal. There was mild focal basal hypertrophy of the septum. Systolic function was normal. The estimated ejection fraction was in the range of 55% to 60%. Wall motion was normal; there were no regional wall motion abnormalities. Doppler parameters are consistent with abnormal left ventricular relaxation (grade 1 diastolic dysfunction). The E/e&' ratio is <8, suggesting normal LV filling pressure. - Aortic valve: Reported pulmonic valve homograft. Trileaflet; mildly calcified leaflets. There was no stenosis. There was moderate to severe regurgitation. Valve area (VTI): 2.67 cm^2. Valve area (Vmax): 2.71 cm^2. Valve area  (Vmean): 2.6 cm^2. - Aortic root: The aortic root is dilated to 4.8 cm. - Ascending aorta: The ascending aorta is dilated to 4.5 cm. - Mitral valve: Mildly thickened leaflets . There was mild regurgitation. - Atrial septum: No defect or patent foramen ovale was identified. - Tricuspid valve: There was  trivial regurgitation. - Pulmonic valve: Poorly visualized. There was mild regurgitation.  Impressions:  - Compared to the prior study in 09/2014, there is more likely severe AI with a further dilated aortic root to 4.8 cm and ascending aorta to 4.5 cm. LVEF is higher at 55-60%.  Transthoracic echocardiography. M-mode, complete 2D, spectral Doppler, and color Doppler. Birthdate: Patient birthdate: [birth year]. Age: Patient is 55 yr old. Sex: Gender: female. BMI: 29.2 kg/m^2. Blood pressure:   122/84 Patient status: Outpatient. Study date: Study date: 04/16/2015. Study time: 10:52 AM. Location: [facility]  -------------------------------------------------------------------  ------------------------------------------------------------------- Left ventricle: The cavity size was normal. There was mild focal basal hypertrophy of the septum. Systolic function was normal. The estimated ejection fraction was in the range of 55% to 60%. Wall motion was normal; there were no regional wall motion abnormalities. Doppler parameters are consistent with abnormal left ventricular relaxation (grade 1 diastolic dysfunction). The E/e&' ratio is <8, suggesting normal LV filling pressure.  ------------------------------------------------------------------- Aortic valve: Reported pulmonic valve homograft. Trileaflet; mildly calcified leaflets. Doppler:  There was no stenosis. There was moderate to severe regurgitation.  VTI ratio of LVOT to aortic valve: 0.64. Valve area (VTI): 2.67 cm^2. Indexed valve area (VTI): 1.41 cm^2/m^2. Peak velocity ratio of LVOT to aortic  valve: 0.65. Valve area (Vmax): 2.71 cm^2. Indexed valve area (Vmax): 1.43 cm^2/m^2. Mean velocity ratio of LVOT to aortic valve: 0.63. Valve area (Vmean): 2.6 cm^2. Indexed valve area (Vmean): 1.38 cm^2/m^2. Mean gradient (S): 7 mm Hg. Peak gradient (S): 12 mm Hg.  ------------------------------------------------------------------- Aorta: Aortic root: The aortic root is dilated to 4.8 cm. Ascending aorta: The ascending aorta is dilated to 4.5 cm.  ------------------------------------------------------------------- Mitral valve:  Mildly thickened leaflets . Doppler: There was mild regurgitation.  ------------------------------------------------------------------- Left atrium: The atrium was normal in size.  ------------------------------------------------------------------- Atrial septum: No defect or patent foramen ovale was identified.  ------------------------------------------------------------------- Pulmonic valve:  Poorly visualized. Doppler: There was mild regurgitation.  ------------------------------------------------------------------- Tricuspid valve:  Doppler: There was trivial regurgitation.  ------------------------------------------------------------------- Pulmonary artery:  The main pulmonary artery was normal-sized.  ------------------------------------------------------------------- Right atrium: The atrium was normal in size.  ------------------------------------------------------------------- Pericardium: There was no pericardial effusion.  ------------------------------------------------------------------- Systemic veins: Inferior vena cava: The vessel was normal in size. The respirophasic diameter changes were in the normal range (= 50%), consistent with normal central venous pressure. Diameter: 11 mm.  ------------------------------------------------------------------- Measurements  IVC                    Value      Reference ID                    11  mm    ---------  Left ventricle              Value     Reference LV ID, ED, PLAX chordal          50.3 mm    43 - 52 LV ID, ES, PLAX chordal          36.3 mm    23 - 38 LV fx shortening, PLAX chordal  (L)   28  %    >=29 LV PW thickness, ED            9.55 mm    --------- IVS/LV PW ratio, ED            1.29      <=1.3 Stroke volume, 2D  92  ml    --------- Stroke volume/bsa, 2D           49  ml/m^2  --------- LV ejection fraction, 1-p A4C       47  %    --------- LV e&', lateral              14  cm/s   --------- LV E/e&', lateral             4.62      --------- LV e&', medial               11.6 cm/s   --------- LV E/e&', medial              5.58      --------- LV e&', average              12.8 cm/s   --------- LV E/e&', average             5.05      ---------  Ventricular septum            Value     Reference IVS thickness, ED             12.3 mm    ---------  LVOT                   Value     Reference LVOT ID, S                23  mm    --------- LVOT area                 4.15 cm^2   --------- LVOT peak velocity, S           111  cm/s   --------- LVOT mean velocity, S           79  cm/s   --------- LVOT VTI, S                22.2 cm    --------- LVOT peak gradient, S           5   mm Hg  ---------  Aortic valve               Value     Reference Aortic valve peak velocity, S       170  cm/s   --------- Aortic valve mean velocity, S       126  cm/s    --------- Aortic valve VTI, S            34.5 cm    --------- Aortic mean gradient, S          7   mm Hg  --------- Aortic peak gradient, S          12  mm Hg  --------- VTI ratio, LVOT/AV            0.64      --------- Aortic valve area, VTI          2.67 cm^2   --------- Aortic valve area/bsa, VTI        1.41 cm^2/m^2 --------- Velocity ratio, peak, LVOT/AV       0.65      --------- Aortic valve area, peak velocity     2.71 cm^2   --------- Aortic valve area/bsa, peak        1.43 cm^2/m^2 --------- velocity Velocity ratio, mean, LVOT/AV  0.63      --------- Aortic valve area, mean velocity     2.6  cm^2   --------- Aortic valve area/bsa, mean        1.38 cm^2/m^2 --------- velocity Aortic regurg pressure half-time     465  ms    ---------  Left atrium                Value     Reference LA ID, A-P, ES              40  mm    --------- LA ID/bsa, A-P              2.12 cm/m^2  <=2.2 LA volume, S               39  ml    --------- LA volume/bsa, S             20.6 ml/m^2  --------- LA volume, ES, 1-p A4C          36  ml    --------- LA volume/bsa, ES, 1-p A4C        19.1 ml/m^2  --------- LA volume, ES, 1-p A2C          39  ml    --------- LA volume/bsa, ES, 1-p A2C        20.6 ml/m^2  ---------  Mitral valve               Value     Reference Mitral E-wave peak velocity        64.7 cm/s   --------- Mitral A-wave peak velocity        83.4 cm/s   --------- Mitral deceleration time         155  ms    150 - 230 Mitral E/A ratio, peak          0.8      ---------  Pulmonary arteries            Value      Reference PA pressure, S, DP            22  mm Hg  <=30  Tricuspid valve              Value     Reference Tricuspid regurg peak velocity      216  cm/s   --------- Tricuspid peak RV-RA gradient       19  mm Hg  --------- Tricuspid maximal regurg         216  cm/s   --------- velocity, PISA  Systemic veins              Value     Reference Estimated CVP               3   mm Hg  ---------  Right ventricle              Value     Reference RV pressure, S, DP            22  mm Hg  <=30 RV s&', lateral, S             11  cm/s   ---------  Legend: (L) and (H) mark values outside specified reference range.  ------------------------------------------------------------------- Prepared and Electronically Authenticated by  Lyman Bishop MD 2017-02-16T17:01:22     CLINICAL DATA: Followup of thoracic aortic aneurysm.  EXAM: CT ANGIOGRAPHY CHEST WITH CONTRAST  TECHNIQUE: Multidetector  CT imaging of the chest was performed using the standard protocol during bolus administration of intravenous contrast. Multiplanar CT image reconstructions and MIPs were obtained to evaluate the vascular anatomy.  CONTRAST: 171mL OMNIPAQUE IOHEXOL 350 MG/ML SOLN  COMPARISON: 10/23/2013. Plain films of 08/31/2014.  FINDINGS: Mediastinum/Nodes: No supraclavicular adenopathy. Status post Ross procedure. Normal caliber great vessels. tortuosity of the thoracic aorta. Transverse measurement of the ascending aorta on the prior exam is artificially enlarged secondary to the tortuosity. Today, this measures 4.6 x 4.0 on image 40 of series 4. 4.7 x 3.8 cm on the prior exam. Based on coronal reformatted images, the aorta measures 3.1 cm at the level of the aortic valve, 3.8 cm just above the aortic valve, and 3.7 cm within the  more distal ascending aorta. Example coronal image 37. When remeasured at the same level on the prior exam, the measurements are 2.9 cm, 3.4 cm, and 3.8 cm respectively. This suggests relative stability. Normal caliber of the transverse and descending segments.  Mild cardiomegaly with prior median sternotomy. No central pulmonary embolism, on this non-dedicated study. No mediastinal hematoma to suggest hemorrhage. No mediastinal or hilar adenopathy.  Lungs/Pleura: No pleural fluid. Minimal motion degradation. Lower lobe predominant bronchial wall thickening. Advanced centrilobular emphysema.  3 mm subpleural left lower lobe pulmonary nodule on image 55 of series 5 is unchanged and likely a subpleural lymph node. No lobar consolidation.  Upper abdomen: Normal imaged portions of the liver, spleen, stomach, pancreas, adrenal glands.  Musculoskeletal: Lower cervical spine fixation.  Review of the MIP images confirms the above findings.  IMPRESSION: 1. Similar appearance of ascending aortic dilatation and tortuosity. 2. Advanced centrilobular emphysema.   Electronically Signed  By: Abigail Miyamoto M.D.  On: 10/29/2014 15:57   Impression:  She has severe aortic valve insufficiency with dilation of the aortic root after previous Ross procedure for bicuspid aortic valve disease. She has chronic shortness of breath with a history of severe COPD on home oxygen with an FEV1 of 1.04 (35% predicted) in 2014 which improved 26% with bronchodilators. I think this is largely responsible for her chronic shortness of breath. 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 I agree that replacement of her aortic valve and root is the best long term treatment to prevent the above problems but she is at increased operative risk due to the redo nature of  root replacement, severe obstructive airways disease, chronic pain and pain medication dependence, anxiety and depression. I discussed the operative procedure of redo root replacement with her and her family, the alternative of continue medical therapy and the eventual deterioration of cardiac function that may preclude surgical treatment. I discussed the options of mechanical and tissue valved grafts for replacement and the pros and cons of both. She is only 81 so a mechanical valve would be the best option for longevity. 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  I discussed the benefits and risks of surgery and she would like to think about it further. I think we should repeat her PFT's to be sure that they are adequate to get through that operation but we will have to wait until her current URI resolves.  Plan:  We will repeat her PFT's in about two weeks which should  give her URI time to resolve. It will also give her time to think about surgery. If she agrees she will need a cardiac cath.   Gaye Pollack, MD [facility] [phone]

## 2015-04-29 ENCOUNTER — Encounter: Payer: Self-pay | Admitting: *Deleted

## 2015-05-05 ENCOUNTER — Other Ambulatory Visit: Payer: Self-pay | Admitting: *Deleted

## 2015-05-05 DIAGNOSIS — I351 Nonrheumatic aortic (valve) insufficiency: Secondary | ICD-10-CM

## 2015-05-05 DIAGNOSIS — I712 Thoracic aortic aneurysm, without rupture, unspecified: Secondary | ICD-10-CM

## 2015-05-06 ENCOUNTER — Other Ambulatory Visit (HOSPITAL_COMMUNITY): Payer: Medicaid Other

## 2015-05-08 ENCOUNTER — Ambulatory Visit (HOSPITAL_COMMUNITY)
Admission: RE | Admit: 2015-05-08 | Discharge: 2015-05-08 | Disposition: A | Discharge status: Home / Self Care | Payer: Medicaid Other | Source: Ambulatory Visit | Attending: Surgery | Admitting: Surgery

## 2015-05-08 DIAGNOSIS — I351 Nonrheumatic aortic (valve) insufficiency: Secondary | ICD-10-CM | POA: Insufficient documentation

## 2015-05-08 DIAGNOSIS — I712 Thoracic aortic aneurysm, without rupture, unspecified: Secondary | ICD-10-CM

## 2015-05-08 LAB — PULMONARY FUNCTION TEST
DL/VA % pred: 84 %
DL/VA: 4.07 ml/min/mmHg/L
DLCO cor % pred: 80 %
DLCO cor: 19.47 ml/min/mmHg
DLCO unc % pred: 80 %
DLCO unc: 19.65 ml/min/mmHg
FEF 25-75 Post: 0.36 L/sec
FEF 25-75 Pre: 0.2 L/sec
FEF2575-%Change-Post: 78 %
FEF2575-%Pred-Post: 13 %
FEF2575-%Pred-Pre: 7 %
FEV1-%CHANGE-POST: 36 %
FEV1-%Pred-Post: 35 %
FEV1-%Pred-Pre: 25 %
FEV1-POST: 0.95 L
FEV1-Pre: 0.69 L
FEV1FVC-%Change-Post: 28 %
FEV1FVC-%Pred-Pre: 41 %
FEV6-%Change-Post: 23 %
FEV6-%PRED-PRE: 48 %
FEV6-%Pred-Post: 59 %
FEV6-PRE: 1.61 L
FEV6-Post: 2 L
FEV6FVC-%Change-Post: 16 %
FEV6FVC-%PRED-POST: 91 %
FEV6FVC-%PRED-PRE: 78 %
FVC-%CHANGE-POST: 6 %
FVC-%PRED-PRE: 61 %
FVC-%Pred-Post: 65 %
FVC-POST: 2.25 L
FVC-PRE: 2.12 L
POST FEV6/FVC RATIO: 89 %
Post FEV1/FVC ratio: 42 %
Pre FEV1/FVC ratio: 33 %
Pre FEV6/FVC Ratio: 76 %
RV % pred: 218 %
RV: 4.06 L
TLC % PRED: 126 %
TLC: 6.37 L

## 2015-05-08 LAB — BLOOD GAS, ARTERIAL
Acid-Base Excess: 5.1 mmol/L — ABNORMAL HIGH (ref 0.0–2.0)
Bicarbonate: 28.3 mEq/L — ABNORMAL HIGH (ref 20.0–24.0)
Drawn by: 21179
FIO2: 0.21
O2 Saturation: 92.9 %
PCO2 ART: 47.2 mmHg — AB (ref 35.0–45.0)
PH ART: 7.412 (ref 7.350–7.450)
Patient temperature: 37
TCO2: 17.5 mmol/L (ref 0–100)
pO2, Arterial: 68.7 mmHg — ABNORMAL LOW (ref 80.0–100.0)

## 2015-05-08 MED ORDER — ALBUTEROL SULFATE (2.5 MG/3ML) 0.083% IN NEBU
2.5000 mg | INHALATION_SOLUTION | Freq: Once | RESPIRATORY_TRACT | Status: AC
  Administered 2015-05-08: 2.5 mg via RESPIRATORY_TRACT

## 2015-05-13 ENCOUNTER — Encounter: Payer: Self-pay | Admitting: Surgery

## 2015-05-13 ENCOUNTER — Ambulatory Visit (INDEPENDENT_AMBULATORY_CARE_PROVIDER_SITE_OTHER): Payer: Medicaid Other | Admitting: Surgery

## 2015-05-13 VITALS — BP 150/84 | HR 84 | Resp 20 | Ht 64.0 in | Wt 170.0 lb

## 2015-05-13 DIAGNOSIS — I509 Heart failure, unspecified: Secondary | ICD-10-CM

## 2015-05-13 DIAGNOSIS — I712 Thoracic aortic aneurysm, without rupture: Secondary | ICD-10-CM

## 2015-05-13 DIAGNOSIS — I7121 Aneurysm of the ascending aorta, without rupture: Secondary | ICD-10-CM

## 2015-05-13 DIAGNOSIS — I351 Nonrheumatic aortic (valve) insufficiency: Secondary | ICD-10-CM

## 2015-05-13 MED ORDER — FUROSEMIDE 40 MG PO TABS: 40.0000 mg | ORAL_TABLET | Freq: Two times a day (BID) | ORAL | Status: DC

## 2015-05-15 ENCOUNTER — Encounter: Payer: Self-pay | Admitting: Surgery

## 2015-05-15 NOTE — Progress Notes (Signed)
HPI:  She returns today to discuss the results of her PFT's and make further surgical plans. When I saw her a few weeks ago she was still trying to recover from a URI but says that that is gone. She had PFT's which showed severe obstructive disease with an FEV1 of 0.69 (25% predicted) which improved to 0.95 ( 35% predicted) with bronchodilator. Her diffusion capacity was 80% of predicted. She has noted no change in her symptoms since I last saw her with chronic dyspnea with mild exertion.   Current Outpatient Prescriptions  Medication Sig Dispense Refill  . albuterol (PROVENTIL) (2.5 MG/3ML) 0.083% nebulizer solution Take 2.5 mg by nebulization every 6 (six) hours as needed for wheezing or shortness of breath.    Marland Kitchen albuterol (VENTOLIN HFA) 108 (90 BASE) MCG/ACT inhaler Inhale 1 puff into the lungs 4 (four) times daily as needed. For shortness of breath    . ALPRAZolam (XANAX) 1 MG tablet Take 1 mg by mouth 4 (four) times daily as needed. For anxiety    . budesonide-formoterol (SYMBICORT) 160-4.5 MCG/ACT inhaler Inhale 2 puffs into the lungs 2 (two) times daily.    Marland Kitchen buPROPion (WELLBUTRIN SR) 150 MG 12 hr tablet Take 150 mg by mouth 2 (two) times daily.    . cetirizine (ZYRTEC) 10 MG tablet Take 10 mg by mouth every morning.     . cyclobenzaprine (FLEXERIL) 10 MG tablet Take 10 mg by mouth 3 (three) times daily as needed for muscle spasms.    Marland Kitchen docusate sodium (COLACE) 100 MG capsule Take 100 mg by mouth 2 (two) times daily as needed for mild constipation.    Marland Kitchen EPINEPHrine (EPIPEN) 0.3 mg/0.3 mL DEVI Inject 0.3 mg into the muscle as needed. For allergic reaction    . furosemide (LASIX) 40 MG tablet Take 1 tablet (40 mg total) by mouth 2 (two) times daily. 60 tablet 1  . meloxicam (MOBIC) 15 MG tablet Take 15 mg by mouth daily.    . montelukast (SINGULAIR) 10 MG tablet Take 10 mg by mouth at bedtime.     . Olopatadine HCl (PATADAY) 0.2 % SOLN Apply 1 drop to eye as needed.    Marland Kitchen omeprazole  (PRILOSEC) 20 MG capsule Take 20 mg by mouth 2 (two) times daily before a meal.    . OVER THE COUNTER MEDICATION Place 2 sprays into both nostrils daily. Clarispray    . oxyCODONE-acetaminophen (PERCOCET) 10-325 MG per tablet Take 1 tablet by mouth 5 (five) times daily as needed for pain. As needed for pain    . oxymorphone (OPANA) 10 MG tablet Take 10 mg by mouth every 8 (eight) hours as needed for pain.    . potassium chloride (K-DUR) 10 MEQ tablet TAKE 2 TABLETS BY MOUTH TWICE DAILY. 120 tablet 7  . PRESCRIPTION MEDICATION Inject as directed every 30 (thirty) days. Allergy Injections    . simvastatin (ZOCOR) 40 MG tablet TAKE 1 TABLET BY MOUTH AT BEDTIME. 30 tablet 9  . SUMAtriptan (IMITREX) 100 MG tablet Take 100 mg by mouth as needed. For migraines    . Tiotropium Bromide Monohydrate (SPIRIVA RESPIMAT) 1.25 MCG/ACT AERS Inhale 2 sprays into the lungs daily.      No current facility-administered medications for this visit.     Physical Exam: BP 150/84 mmHg  Pulse 84  Resp 20  Ht 5\' 4"  (1.626 m)  Wt 170 lb (77.111 kg)  BMI 29.17 kg/m2  SpO2 98% She looks well Breath sounds decreased  throughout but clear Cardiac exam shows a RRR with 3/6 diastolic AI murmur. No peripheral edema.  Diagnostic Tests:      Ref Range 7d ago     FVC-Pre L 2.12    FVC-%Pred-Pre % 61    FVC-Post L 2.25    FVC-%Pred-Post % 65    FVC-%Change-Post % 6    FEV1-Pre L 0.69    FEV1-%Pred-Pre % 25    FEV1-Post L 0.95    FEV1-%Pred-Post % 35    FEV1-%Change-Post % 36    FEV6-Pre L 1.61    FEV6-%Pred-Pre % 48    FEV6-Post L 2.00    FEV6-%Pred-Post % 59    FEV6-%Change-Post % 23    Pre FEV1/FVC ratio % 33    FEV1FVC-%Pred-Pre % 41    Post FEV1/FVC ratio % 42    FEV1FVC-%Change-Post % 28    Pre FEV6/FVC Ratio % 76    FEV6FVC-%Pred-Pre % 78    Post FEV6/FVC ratio % 89    FEV6FVC-%Pred-Post % 91    FEV6FVC-%Change-Post % 16    FEF 25-75 Pre L/sec 0.20     FEF2575-%Pred-Pre % 7    FEF 25-75 Post L/sec 0.36    FEF2575-%Pred-Post % 13    FEF2575-%Change-Post % 78    RV L 4.06    RV % pred % 218    TLC L 6.37    TLC % pred % 126    DLCO unc ml/min/mmHg 19.65    DLCO unc % pred % 80    DLCO cor ml/min/mmHg 19.47    DLCO cor % pred % 80    DL/VA ml/min/mmHg/L 4.07    DL/VA % pred % 84   Resulting Agency  [facility]      Specimen Collected: 05/08/15 2:15 PM Last Resulted: 05/08/15 4:33 PM              Impression:  She has severe aortic valve insufficiency with dilation of the aortic root after previous Ross procedure for bicuspid aortic valve disease. She has chronic shortness of breath with a history of severe COPD on home oxygen with severe airway obstruction on PFT's but significant improvement with bronchodilators. I think this is largely responsible for her chronic shortness of breath. 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 I agree that replacement of her aortic valve and root is the best long term treatment to prevent the above problems but she is at increased operative risk due to the redo nature of root replacement, severe obstructive airways disease, chronic pain and pain medication dependence, anxiety and depression. I discussed the operative procedure of redo root replacement with her and her family, the alternative of continue medical therapy and the eventual deterioration of cardiac function that may preclude surgical treatment. I discussed the options of mechanical and tissue valved grafts for replacement and the pros and cons of both. She is only 53 so a mechanical valve would be the best option for longevity. 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 She understands and is in agreement  with a mechanical valved graft. I discussed the operative procedure with the patient and family including alternatives, benefits and risks; including but not limited to bleeding, blood transfusion, infection, stroke, myocardial infarction, heart block requiring a  permanent pacemaker, organ dysfunction, and death.  Vivi Ferns understands and agrees to proceed.  She will need a cardiac cath prior to surgery.    Plan:  We will schedule cardiac cath with Dr. [name] and then will review the results with her and schedule redo Bentall procedure using a mechanical valved graft.   Gaye Pollack, MD [facility] [phone]

## 2015-05-18 ENCOUNTER — Encounter: Payer: Self-pay | Admitting: *Deleted

## 2015-05-18 ENCOUNTER — Telehealth: Payer: Self-pay | Admitting: *Deleted

## 2015-05-18 DIAGNOSIS — I359 Nonrheumatic aortic valve disorder, unspecified: Secondary | ICD-10-CM

## 2015-05-18 DIAGNOSIS — I712 Thoracic aortic aneurysm, without rupture, unspecified: Secondary | ICD-10-CM

## 2015-05-18 NOTE — Telephone Encounter (Signed)
I spoke with pt and verbally went over all instructions with her.  Copy of instructions left at front desk for pt to pick up when here to have labs drawn on March 21,2017.

## 2015-05-18 NOTE — Telephone Encounter (Signed)
Pt needs to be set up for right and left heart cath.  I spoke with pt and she would like to schedule for March 23,2017. Procedure scheduled for March 23,2017 at 9:00.

## 2015-05-19 ENCOUNTER — Other Ambulatory Visit (INDEPENDENT_AMBULATORY_CARE_PROVIDER_SITE_OTHER): Payer: Medicaid Other | Admitting: *Deleted

## 2015-05-19 DIAGNOSIS — I712 Thoracic aortic aneurysm, without rupture, unspecified: Secondary | ICD-10-CM

## 2015-05-19 DIAGNOSIS — I359 Nonrheumatic aortic valve disorder, unspecified: Secondary | ICD-10-CM

## 2015-05-19 LAB — CBC WITH DIFFERENTIAL/PLATELET
BASOS PCT: 0 % (ref 0–1)
Basophils Absolute: 0 10*3/uL (ref 0.0–0.1)
Eosinophils Absolute: 0.2 10*3/uL (ref 0.0–0.7)
Eosinophils Relative: 3 % (ref 0–5)
HEMATOCRIT: 42.3 % (ref 36.0–46.0)
HEMOGLOBIN: 14.4 g/dL (ref 12.0–15.0)
Lymphocytes Relative: 32 % (ref 12–46)
Lymphs Abs: 2.2 10*3/uL (ref 0.7–4.0)
MCH: 29.9 pg (ref 26.0–34.0)
MCHC: 34 g/dL (ref 30.0–36.0)
MCV: 87.9 fL (ref 78.0–100.0)
MONO ABS: 0.6 10*3/uL (ref 0.1–1.0)
MONOS PCT: 8 % (ref 3–12)
MPV: 9.4 fL (ref 8.6–12.4)
NEUTROS ABS: 3.9 10*3/uL (ref 1.7–7.7)
Neutrophils Relative %: 57 % (ref 43–77)
Platelets: 262 10*3/uL (ref 150–400)
RBC: 4.81 MIL/uL (ref 3.87–5.11)
RDW: 13.2 % (ref 11.5–15.5)
WBC: 6.9 10*3/uL (ref 4.0–10.5)

## 2015-05-19 LAB — PROTIME-INR
INR: 0.99 (ref <1.50)
PROTHROMBIN TIME: 13.2 s (ref 11.6–15.2)

## 2015-05-19 LAB — BASIC METABOLIC PANEL
BUN: 18 mg/dL (ref 7–25)
CALCIUM: 9 mg/dL (ref 8.6–10.4)
CO2: 27 mmol/L (ref 20–31)
Chloride: 102 mmol/L (ref 98–110)
Creat: 0.8 mg/dL (ref 0.50–1.05)
GLUCOSE: 87 mg/dL (ref 65–99)
Potassium: 4.4 mmol/L (ref 3.5–5.3)
SODIUM: 139 mmol/L (ref 135–146)

## 2015-05-19 NOTE — Addendum Note (Signed)
Addended by: Lauree Chandler D on: 05/19/2015 07:41 AM   Modules accepted: Orders

## 2015-05-19 NOTE — Telephone Encounter (Signed)
Cath orders placed. cdm 

## 2015-05-21 ENCOUNTER — Ambulatory Visit (HOSPITAL_COMMUNITY)
Admission: RE | Admit: 2015-05-21 | Discharge: 2015-05-21 | Disposition: A | Discharge status: Home / Self Care | Payer: Medicaid Other | Source: Ambulatory Visit | Attending: Cardiovascular Disease | Admitting: Cardiovascular Disease

## 2015-05-21 ENCOUNTER — Encounter (HOSPITAL_COMMUNITY)
Admission: RE | Disposition: A | Discharge status: Home / Self Care | Payer: Self-pay | Source: Ambulatory Visit | Attending: Cardiovascular Disease

## 2015-05-21 DIAGNOSIS — G8929 Other chronic pain: Secondary | ICD-10-CM | POA: Insufficient documentation

## 2015-05-21 DIAGNOSIS — J449 Chronic obstructive pulmonary disease, unspecified: Secondary | ICD-10-CM | POA: Diagnosis not present

## 2015-05-21 DIAGNOSIS — I359 Nonrheumatic aortic valve disorder, unspecified: Secondary | ICD-10-CM | POA: Insufficient documentation

## 2015-05-21 DIAGNOSIS — I351 Nonrheumatic aortic (valve) insufficiency: Secondary | ICD-10-CM

## 2015-05-21 DIAGNOSIS — F419 Anxiety disorder, unspecified: Secondary | ICD-10-CM | POA: Diagnosis not present

## 2015-05-21 DIAGNOSIS — I2510 Atherosclerotic heart disease of native coronary artery without angina pectoris: Secondary | ICD-10-CM | POA: Insufficient documentation

## 2015-05-21 DIAGNOSIS — Z9981 Dependence on supplemental oxygen: Secondary | ICD-10-CM | POA: Diagnosis not present

## 2015-05-21 DIAGNOSIS — F329 Major depressive disorder, single episode, unspecified: Secondary | ICD-10-CM | POA: Diagnosis not present

## 2015-05-21 DIAGNOSIS — I77810 Thoracic aortic ectasia: Secondary | ICD-10-CM | POA: Insufficient documentation

## 2015-05-21 HISTORY — PX: CARDIAC CATHETERIZATION: SHX172

## 2015-05-21 LAB — POCT I-STAT 3, VENOUS BLOOD GAS (G3P V)
ACID-BASE EXCESS: 1 mmol/L (ref 0.0–2.0)
Acid-base deficit: 13 mmol/L — ABNORMAL HIGH (ref 0.0–2.0)
Bicarbonate: 16.9 mEq/L — ABNORMAL LOW (ref 20.0–24.0)
Bicarbonate: 28.4 mEq/L — ABNORMAL HIGH (ref 20.0–24.0)
O2 Saturation: 38 %
O2 Saturation: 59 %
PCO2 VEN: 52.3 mmHg — AB (ref 45.0–50.0)
PH VEN: 7.324 — AB (ref 7.250–7.300)
PO2 VEN: 34 mmHg (ref 31.0–45.0)
TCO2: 18 mmol/L (ref 0–100)
TCO2: 30 mmol/L (ref 0–100)
pCO2, Ven: 54.7 mmHg — ABNORMAL HIGH (ref 45.0–50.0)
pH, Ven: 7.117 — CL (ref 7.250–7.300)
pO2, Ven: 30 mmHg — ABNORMAL LOW (ref 31.0–45.0)

## 2015-05-21 LAB — POCT I-STAT 3, ART BLOOD GAS (G3+)
ACID-BASE EXCESS: 2 mmol/L (ref 0.0–2.0)
Bicarbonate: 28.1 mEq/L — ABNORMAL HIGH (ref 20.0–24.0)
O2 Saturation: 93 %
PCO2 ART: 46.8 mmHg — AB (ref 35.0–45.0)
PO2 ART: 69 mmHg — AB (ref 80.0–100.0)
TCO2: 30 mmol/L (ref 0–100)
pH, Arterial: 7.387 (ref 7.350–7.450)

## 2015-05-21 LAB — POCT ACTIVATED CLOTTING TIME: ACTIVATED CLOTTING TIME: 162 s

## 2015-05-21 SURGERY — RIGHT/LEFT HEART CATH AND CORONARY ANGIOGRAPHY

## 2015-05-21 MED ORDER — IOPAMIDOL (ISOVUE-370) INJECTION 76%
INTRAVENOUS | Status: DC | PRN
  Administered 2015-05-21: 140 mL via INTRAVENOUS

## 2015-05-21 MED ORDER — IOPAMIDOL (ISOVUE-370) INJECTION 76%
INTRAVENOUS | Status: AC
  Filled 2015-05-21: qty 100

## 2015-05-21 MED ORDER — HEPARIN (PORCINE) IN NACL 2-0.9 UNIT/ML-% IJ SOLN
INTRAMUSCULAR | Status: AC
  Filled 2015-05-21: qty 500

## 2015-05-21 MED ORDER — ASPIRIN 81 MG PO CHEW
81.0000 mg | CHEWABLE_TABLET | ORAL | Status: AC
  Administered 2015-05-21: 81 mg via ORAL

## 2015-05-21 MED ORDER — VERAPAMIL HCL 2.5 MG/ML IV SOLN
INTRAVENOUS | Status: AC
  Filled 2015-05-21: qty 2

## 2015-05-21 MED ORDER — SODIUM CHLORIDE 0.9% FLUSH: 3.0000 mL | INTRAVENOUS | Status: DC | PRN

## 2015-05-21 MED ORDER — ASPIRIN 81 MG PO CHEW
CHEWABLE_TABLET | ORAL | Status: AC
  Filled 2015-05-21: qty 1

## 2015-05-21 MED ORDER — FENTANYL CITRATE (PF) 100 MCG/2ML IJ SOLN
INTRAMUSCULAR | Status: DC | PRN
  Administered 2015-05-21 (×3): 25 ug via INTRAVENOUS

## 2015-05-21 MED ORDER — VERAPAMIL HCL 2.5 MG/ML IV SOLN
INTRAVENOUS | Status: DC | PRN
  Administered 2015-05-21: 10 mL via INTRA_ARTERIAL

## 2015-05-21 MED ORDER — HEPARIN SODIUM (PORCINE) 1000 UNIT/ML IJ SOLN
INTRAMUSCULAR | Status: AC
  Filled 2015-05-21: qty 1

## 2015-05-21 MED ORDER — SODIUM CHLORIDE 0.9% FLUSH: 3.0000 mL | Freq: Two times a day (BID) | INTRAVENOUS | Status: DC

## 2015-05-21 MED ORDER — MIDAZOLAM HCL 2 MG/2ML IJ SOLN
INTRAMUSCULAR | Status: AC
  Filled 2015-05-21: qty 2

## 2015-05-21 MED ORDER — LIDOCAINE HCL (PF) 1 % IJ SOLN
INTRAMUSCULAR | Status: AC
  Filled 2015-05-21: qty 30

## 2015-05-21 MED ORDER — MIDAZOLAM HCL 2 MG/2ML IJ SOLN
INTRAMUSCULAR | Status: DC | PRN
  Administered 2015-05-21: 1 mg via INTRAVENOUS
  Administered 2015-05-21: 2 mg via INTRAVENOUS
  Administered 2015-05-21: 1 mg via INTRAVENOUS

## 2015-05-21 MED ORDER — LIDOCAINE HCL (PF) 1 % IJ SOLN
INTRAMUSCULAR | Status: DC | PRN
  Administered 2015-05-21 (×2): 2 mL
  Administered 2015-05-21: 10 mL

## 2015-05-21 MED ORDER — HEPARIN (PORCINE) IN NACL 2-0.9 UNIT/ML-% IJ SOLN
INTRAMUSCULAR | Status: DC | PRN
  Administered 2015-05-21: 1000 mL

## 2015-05-21 MED ORDER — SODIUM CHLORIDE 0.9 % IV SOLN
INTRAVENOUS | Status: AC
  Administered 2015-05-21: 08:00:00 via INTRAVENOUS

## 2015-05-21 MED ORDER — SODIUM CHLORIDE 0.9 % IV SOLN: 250.0000 mL | INTRAVENOUS | Status: DC | PRN

## 2015-05-21 MED ORDER — HEPARIN SODIUM (PORCINE) 1000 UNIT/ML IJ SOLN
INTRAMUSCULAR | Status: DC | PRN
  Administered 2015-05-21: 4000 [IU] via INTRAVENOUS

## 2015-05-21 MED ORDER — SODIUM CHLORIDE 0.9 % IV SOLN
INTRAVENOUS | Status: AC
Start: 2015-05-21 — End: 2015-05-21

## 2015-05-21 MED ORDER — FENTANYL CITRATE (PF) 100 MCG/2ML IJ SOLN
INTRAMUSCULAR | Status: AC
  Filled 2015-05-21: qty 2

## 2015-05-21 SURGICAL SUPPLY — 18 items
CATH BALLN WEDGE 5F 110CM (CATHETERS) ×1 IMPLANT
CATH EXPO 5F MPA-1 (CATHETERS) ×1 IMPLANT
CATH INFINITI 5 FR AL2 (CATHETERS) ×1 IMPLANT
CATH INFINITI 5 FR JL3.5 (CATHETERS) ×1 IMPLANT
CATH INFINITI 5FR AL1 (CATHETERS) ×1 IMPLANT
CATH INFINITI 5FR ANG PIGTAIL (CATHETERS) ×1 IMPLANT
CATH INFINITI 5FR JL5 (CATHETERS) ×1 IMPLANT
CATH INFINITI JR4 5F (CATHETERS) ×1 IMPLANT
DEVICE RAD COMP TR BAND LRG (VASCULAR PRODUCTS) ×2 IMPLANT
GLIDESHEATH SLEND SS 6F .021 (SHEATH) ×1 IMPLANT
KIT HEART LEFT (KITS) ×2 IMPLANT
PACK CARDIAC CATHETERIZATION (CUSTOM PROCEDURE TRAY) ×2 IMPLANT
SHEATH FAST CATH BRACH 5F 5CM (SHEATH) ×1 IMPLANT
SHEATH PINNACLE 5F 10CM (SHEATH) ×1 IMPLANT
SYR MEDRAD MARK V 150ML (SYRINGE) ×1 IMPLANT
TRANSDUCER W/STOPCOCK (MISCELLANEOUS) ×4 IMPLANT
TUBING CIL FLEX 10 FLL-RA (TUBING) ×2 IMPLANT
WIRE SAFE-T 1.5MM-J .035X260CM (WIRE) ×1 IMPLANT

## 2015-05-21 NOTE — Interval H&P Note (Signed)
History and Physical Interval Note:  05/21/2015 7:49 AM  Vivi Ferns  has presented today for cardiac cath with the diagnosis of aortic insufficiency. The various methods of treatment have been discussed with the patient and family. After consideration of risks, benefits and other options for treatment, the patient has consented to  Procedure(s): Right/Left Heart Cath and Coronary Angiography (N/A) as a surgical intervention .  The patient's history has been reviewed, patient examined, no change in status, stable for surgery.  I have reviewed the patient's chart and labs.  Questions were answered to the patient's satisfaction.     Pellegrino Kennard

## 2015-05-21 NOTE — H&P (View-Only) (Signed)
HPI:  She returns today to discuss the results of her PFT's and make further surgical plans. When I saw her a few weeks ago she was still trying to recover from a URI but says that that is gone. She had PFT's which showed severe obstructive disease with an FEV1 of 0.69 (25% predicted) which improved to 0.95 ( 35% predicted) with bronchodilator. Her diffusion capacity was 80% of predicted. She has noted no change in her symptoms since I last saw her with chronic dyspnea with mild exertion.   Current Outpatient Prescriptions  Medication Sig Dispense Refill  . albuterol (PROVENTIL) (2.5 MG/3ML) 0.083% nebulizer solution Take 2.5 mg by nebulization every 6 (six) hours as needed for wheezing or shortness of breath.    Marland Kitchen albuterol (VENTOLIN HFA) 108 (90 BASE) MCG/ACT inhaler Inhale 1 puff into the lungs 4 (four) times daily as needed. For shortness of breath    . ALPRAZolam (XANAX) 1 MG tablet Take 1 mg by mouth 4 (four) times daily as needed. For anxiety    . budesonide-formoterol (SYMBICORT) 160-4.5 MCG/ACT inhaler Inhale 2 puffs into the lungs 2 (two) times daily.    Marland Kitchen buPROPion (WELLBUTRIN SR) 150 MG 12 hr tablet Take 150 mg by mouth 2 (two) times daily.    . cetirizine (ZYRTEC) 10 MG tablet Take 10 mg by mouth every morning.     . cyclobenzaprine (FLEXERIL) 10 MG tablet Take 10 mg by mouth 3 (three) times daily as needed for muscle spasms.    Marland Kitchen docusate sodium (COLACE) 100 MG capsule Take 100 mg by mouth 2 (two) times daily as needed for mild constipation.    Marland Kitchen EPINEPHrine (EPIPEN) 0.3 mg/0.3 mL DEVI Inject 0.3 mg into the muscle as needed. For allergic reaction    . furosemide (LASIX) 40 MG tablet Take 1 tablet (40 mg total) by mouth 2 (two) times daily. 60 tablet 1  . meloxicam (MOBIC) 15 MG tablet Take 15 mg by mouth daily.    . montelukast (SINGULAIR) 10 MG tablet Take 10 mg by mouth at bedtime.     . Olopatadine HCl (PATADAY) 0.2 % SOLN Apply 1 drop to eye as needed.    Marland Kitchen omeprazole  (PRILOSEC) 20 MG capsule Take 20 mg by mouth 2 (two) times daily before a meal.    . OVER THE COUNTER MEDICATION Place 2 sprays into both nostrils daily. Clarispray    . oxyCODONE-acetaminophen (PERCOCET) 10-325 MG per tablet Take 1 tablet by mouth 5 (five) times daily as needed for pain. As needed for pain    . oxymorphone (OPANA) 10 MG tablet Take 10 mg by mouth every 8 (eight) hours as needed for pain.    . potassium chloride (K-DUR) 10 MEQ tablet TAKE 2 TABLETS BY MOUTH TWICE DAILY. 120 tablet 7  . PRESCRIPTION MEDICATION Inject as directed every 30 (thirty) days. Allergy Injections    . simvastatin (ZOCOR) 40 MG tablet TAKE 1 TABLET BY MOUTH AT BEDTIME. 30 tablet 9  . SUMAtriptan (IMITREX) 100 MG tablet Take 100 mg by mouth as needed. For migraines    . Tiotropium Bromide Monohydrate (SPIRIVA RESPIMAT) 1.25 MCG/ACT AERS Inhale 2 sprays into the lungs daily.      No current facility-administered medications for this visit.     Physical Exam: BP 150/84 mmHg  Pulse 84  Resp 20  Ht 5\' 4"  (1.626 m)  Wt 170 lb (77.111 kg)  BMI 29.17 kg/m2  SpO2 98% She looks well Breath sounds decreased  throughout but clear Cardiac exam shows a RRR with 3/6 diastolic AI murmur. No peripheral edema.  Diagnostic Tests:      Ref Range 7d ago     FVC-Pre L 2.12    FVC-%Pred-Pre % 61    FVC-Post L 2.25    FVC-%Pred-Post % 65    FVC-%Change-Post % 6    FEV1-Pre L 0.69    FEV1-%Pred-Pre % 25    FEV1-Post L 0.95    FEV1-%Pred-Post % 35    FEV1-%Change-Post % 36    FEV6-Pre L 1.61    FEV6-%Pred-Pre % 48    FEV6-Post L 2.00    FEV6-%Pred-Post % 59    FEV6-%Change-Post % 23    Pre FEV1/FVC ratio % 33    FEV1FVC-%Pred-Pre % 41    Post FEV1/FVC ratio % 42    FEV1FVC-%Change-Post % 28    Pre FEV6/FVC Ratio % 76    FEV6FVC-%Pred-Pre % 78    Post FEV6/FVC ratio % 89    FEV6FVC-%Pred-Post % 91    FEV6FVC-%Change-Post % 16    FEF 25-75 Pre L/sec 0.20     FEF2575-%Pred-Pre % 7    FEF 25-75 Post L/sec 0.36    FEF2575-%Pred-Post % 13    FEF2575-%Change-Post % 78    RV L 4.06    RV % pred % 218    TLC L 6.37    TLC % pred % 126    DLCO unc ml/min/mmHg 19.65    DLCO unc % pred % 80    DLCO cor ml/min/mmHg 19.47    DLCO cor % pred % 80    DL/VA ml/min/mmHg/L 4.07    DL/VA % pred % 84   Resulting Agency  [facility]      Specimen Collected: 05/08/15 2:15 PM Last Resulted: 05/08/15 4:33 PM              Impression:  She has severe aortic valve insufficiency with dilation of the aortic root after previous Ross procedure for bicuspid aortic valve disease. She has chronic shortness of breath with a history of severe COPD on home oxygen with severe airway obstruction on PFT's but significant improvement with bronchodilators. I think this is largely responsible for her chronic shortness of breath. 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 I agree that replacement of her aortic valve and root is the best long term treatment to prevent the above problems but she is at increased operative risk due to the redo nature of root replacement, severe obstructive airways disease, chronic pain and pain medication dependence, anxiety and depression. I discussed the operative procedure of redo root replacement with her and her family, the alternative of continue medical therapy and the eventual deterioration of cardiac function that may preclude surgical treatment. I discussed the options of mechanical and tissue valved grafts for replacement and the pros and cons of both. She is only 43 so a mechanical valve would be the best option for longevity. 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 She understands and is in agreement  with a mechanical valved graft. I discussed the operative procedure with the patient and family including alternatives, benefits and risks; including but not limited to bleeding, blood transfusion, infection, stroke, myocardial infarction, heart block requiring a  permanent pacemaker, organ dysfunction, and death.  Lindsay Bonilla understands and agrees to proceed.  She will need a cardiac cath prior to surgery.    Plan:  We will schedule cardiac cath with Dr. [name] and then will review the results with her and schedule redo Bentall procedure using a mechanical valved graft.   Gaye Pollack, MD [facility] [phone]

## 2015-05-21 NOTE — Progress Notes (Addendum)
Site area: RFA Site Prior to Removal:  Level 0 Pressure Applied For:2minManual:  yes  Patient Status During Pull:  stable Post Pull Site:  Level 0 Post Pull Instructions Given:  yes Post Pull Pulses Present:palpable  Dressing Applied: tegaderm  Bedrest begins @ 1140 till 1540 Comments:

## 2015-05-21 NOTE — Discharge Instructions (Signed)
Angiogram, Care After °Refer to this sheet in the next few weeks. These instructions provide you with information about caring for yourself after your procedure. Your health care provider may also give you more specific instructions. Your treatment has been planned according to current medical practices, but problems sometimes occur. Call your health care provider if you have any problems or questions after your procedure. °WHAT TO EXPECT AFTER THE PROCEDURE °After your procedure, it is typical to have the following: °· Bruising at the catheter insertion site that usually fades within 1-2 weeks. °· Blood collecting in the tissue (hematoma) that may be painful to the touch. It should usually decrease in size and tenderness within 1-2 weeks. °HOME CARE INSTRUCTIONS °· Take medicines only as directed by your health care provider. °· You may shower 24-48 hours after the procedure or as directed by your health care provider. Remove the bandage (dressing) and gently wash the site with plain soap and water. Pat the area dry with a clean towel. Do not rub the site, because this may cause bleeding. °· Do not take baths, swim, or use a hot tub until your health care provider approves. °· Check your insertion site every day for redness, swelling, or drainage. °· Do not apply powder or lotion to the site. °· Do not lift over 10 lb (4.5 kg) for 5 days after your procedure or as directed by your health care provider. °· Ask your health care provider when it is okay to: °¨ Return to work or school. °¨ Resume usual physical activities or sports. °¨ Resume sexual activity. °· Do not drive home if you are discharged the same day as the procedure. Have someone else drive you. °· You may drive 24 hours after the procedure unless otherwise instructed by your health care provider. °· Do not operate machinery or power tools for 24 hours after the procedure or as directed by your health care provider. °· If your procedure was done as an  outpatient procedure, which means that you went home the same day as your procedure, a responsible adult should be with you for the first 24 hours after you arrive home. °· Keep all follow-up visits as directed by your health care provider. This is important. °SEEK MEDICAL CARE IF: °· You have a fever. °· You have chills. °· You have increased bleeding from the catheter insertion site. Hold pressure on the site. °SEEK IMMEDIATE MEDICAL CARE IF: °· You have unusual pain at the catheter insertion site. °· You have redness, warmth, or swelling at the catheter insertion site. °· You have drainage (other than a small amount of blood on the dressing) from the catheter insertion site. °· The catheter insertion site is bleeding, and the bleeding does not stop after 30 minutes of holding steady pressure on the site. °· The area near or just beyond the catheter insertion site becomes pale, cool, tingly, or numb. °  °This information is not intended to replace advice given to you by your health care provider. Make sure you discuss any questions you have with your health care provider. °  °Document Released: 09/02/2004 Document Revised: 03/07/2014 Document Reviewed: 07/18/2012 °Elsevier Interactive Patient Education ©2016 [employer] ° °

## 2015-05-21 NOTE — Progress Notes (Signed)
Right brachial vein sheath removed and pressure held for 10 minutes. Level zero. Right distal radial pulse intact. Satira Sark RT-R.

## 2015-05-22 ENCOUNTER — Encounter (HOSPITAL_COMMUNITY): Payer: Self-pay | Admitting: Cardiovascular Disease

## 2015-05-27 ENCOUNTER — Other Ambulatory Visit: Payer: Self-pay | Admitting: *Deleted

## 2015-05-27 DIAGNOSIS — I351 Nonrheumatic aortic (valve) insufficiency: Secondary | ICD-10-CM

## 2015-05-27 DIAGNOSIS — I712 Thoracic aortic aneurysm, without rupture, unspecified: Secondary | ICD-10-CM

## 2015-06-11 ENCOUNTER — Encounter (HOSPITAL_COMMUNITY): Payer: Self-pay

## 2015-06-11 ENCOUNTER — Ambulatory Visit (HOSPITAL_COMMUNITY)
Admission: RE | Admit: 2015-06-11 | Discharge: 2015-06-11 | Disposition: A | Discharge status: Home / Self Care | Payer: Medicaid Other | Source: Ambulatory Visit | Attending: Surgery | Admitting: Surgery

## 2015-06-11 ENCOUNTER — Encounter (HOSPITAL_COMMUNITY)
Admission: RE | Admit: 2015-06-11 | Discharge: 2015-06-11 | Disposition: A | Discharge status: Home / Self Care | Payer: Medicaid Other | Source: Ambulatory Visit | Attending: Surgery | Admitting: Surgery

## 2015-06-11 VITALS — BP 147/70 | HR 86 | Temp 98.1°F | Resp 18 | Ht 64.0 in | Wt 169.5 lb

## 2015-06-11 DIAGNOSIS — Z0183 Encounter for blood typing: Secondary | ICD-10-CM | POA: Diagnosis not present

## 2015-06-11 DIAGNOSIS — Z01812 Encounter for preprocedural laboratory examination: Secondary | ICD-10-CM | POA: Insufficient documentation

## 2015-06-11 DIAGNOSIS — Z87891 Personal history of nicotine dependence: Secondary | ICD-10-CM | POA: Insufficient documentation

## 2015-06-11 DIAGNOSIS — I712 Thoracic aortic aneurysm, without rupture, unspecified: Secondary | ICD-10-CM

## 2015-06-11 DIAGNOSIS — I351 Nonrheumatic aortic (valve) insufficiency: Secondary | ICD-10-CM | POA: Diagnosis not present

## 2015-06-11 DIAGNOSIS — Z7951 Long term (current) use of inhaled steroids: Secondary | ICD-10-CM | POA: Insufficient documentation

## 2015-06-11 DIAGNOSIS — Z01818 Encounter for other preprocedural examination: Secondary | ICD-10-CM | POA: Diagnosis present

## 2015-06-11 DIAGNOSIS — I6523 Occlusion and stenosis of bilateral carotid arteries: Secondary | ICD-10-CM | POA: Insufficient documentation

## 2015-06-11 DIAGNOSIS — Z981 Arthrodesis status: Secondary | ICD-10-CM | POA: Insufficient documentation

## 2015-06-11 DIAGNOSIS — I509 Heart failure, unspecified: Secondary | ICD-10-CM | POA: Diagnosis not present

## 2015-06-11 DIAGNOSIS — Z79899 Other long term (current) drug therapy: Secondary | ICD-10-CM | POA: Insufficient documentation

## 2015-06-11 DIAGNOSIS — Z952 Presence of prosthetic heart valve: Secondary | ICD-10-CM | POA: Insufficient documentation

## 2015-06-11 DIAGNOSIS — J449 Chronic obstructive pulmonary disease, unspecified: Secondary | ICD-10-CM | POA: Diagnosis not present

## 2015-06-11 DIAGNOSIS — Z9981 Dependence on supplemental oxygen: Secondary | ICD-10-CM | POA: Diagnosis not present

## 2015-06-11 HISTORY — DX: Nausea with vomiting, unspecified: Z98.890

## 2015-06-11 HISTORY — DX: Major depressive disorder, single episode, unspecified: F32.9

## 2015-06-11 HISTORY — DX: Unspecified hearing loss, unspecified ear: H91.90

## 2015-06-11 HISTORY — DX: Personal history of pneumonia (recurrent): Z87.01

## 2015-06-11 HISTORY — DX: Other cervical disc degeneration, unspecified cervical region: M50.30

## 2015-06-11 HISTORY — DX: Depression, unspecified: F32.A

## 2015-06-11 HISTORY — DX: Nausea with vomiting, unspecified: R11.2

## 2015-06-11 HISTORY — DX: Stress incontinence (female) (male): N39.3

## 2015-06-11 HISTORY — DX: Frequency of micturition: R35.0

## 2015-06-11 LAB — COMPREHENSIVE METABOLIC PANEL
ALT: 20 U/L (ref 14–54)
AST: 26 U/L (ref 15–41)
Albumin: 4.1 g/dL (ref 3.5–5.0)
Alkaline Phosphatase: 72 U/L (ref 38–126)
Anion gap: 11 (ref 5–15)
BILIRUBIN TOTAL: 0.7 mg/dL (ref 0.3–1.2)
BUN: 18 mg/dL (ref 6–20)
CO2: 23 mmol/L (ref 22–32)
CREATININE: 0.73 mg/dL (ref 0.44–1.00)
Calcium: 8.7 mg/dL — ABNORMAL LOW (ref 8.9–10.3)
Chloride: 103 mmol/L (ref 101–111)
Glucose, Bld: 85 mg/dL (ref 65–99)
Potassium: 4.3 mmol/L (ref 3.5–5.1)
Sodium: 137 mmol/L (ref 135–145)
TOTAL PROTEIN: 6.4 g/dL — AB (ref 6.5–8.1)

## 2015-06-11 LAB — URINALYSIS, ROUTINE W REFLEX MICROSCOPIC
Bilirubin Urine: NEGATIVE
Glucose, UA: NEGATIVE mg/dL
HGB URINE DIPSTICK: NEGATIVE
Ketones, ur: NEGATIVE mg/dL
Leukocytes, UA: NEGATIVE
Nitrite: NEGATIVE
Protein, ur: NEGATIVE mg/dL
SPECIFIC GRAVITY, URINE: 1.029 (ref 1.005–1.030)
pH: 5.5 (ref 5.0–8.0)

## 2015-06-11 LAB — BLOOD GAS, ARTERIAL
ACID-BASE EXCESS: 2 mmol/L (ref 0.0–2.0)
Bicarbonate: 26.4 mEq/L — ABNORMAL HIGH (ref 20.0–24.0)
FIO2: 0.21
O2 Saturation: 95.5 %
PCO2 ART: 44.1 mmHg (ref 35.0–45.0)
PH ART: 7.395 (ref 7.350–7.450)
PO2 ART: 78.7 mmHg — AB (ref 80.0–100.0)
Patient temperature: 98.6
TCO2: 27.8 mmol/L (ref 0–100)

## 2015-06-11 LAB — CBC
HEMATOCRIT: 39.4 % (ref 36.0–46.0)
Hemoglobin: 13 g/dL (ref 12.0–15.0)
MCH: 29 pg (ref 26.0–34.0)
MCHC: 33 g/dL (ref 30.0–36.0)
MCV: 87.9 fL (ref 78.0–100.0)
Platelets: 232 10*3/uL (ref 150–400)
RBC: 4.48 MIL/uL (ref 3.87–5.11)
RDW: 12.7 % (ref 11.5–15.5)
WBC: 7 10*3/uL (ref 4.0–10.5)

## 2015-06-11 LAB — PROTIME-INR
INR: 1.1 (ref 0.00–1.49)
PROTHROMBIN TIME: 14.4 s (ref 11.6–15.2)

## 2015-06-11 LAB — APTT: aPTT: 31 seconds (ref 24–37)

## 2015-06-11 LAB — SURGICAL PCR SCREEN
MRSA, PCR: NEGATIVE
STAPHYLOCOCCUS AUREUS: NEGATIVE

## 2015-06-11 NOTE — Progress Notes (Signed)
Anesthesia Chart Review:  Pt is a 55 year old female scheduled for redo sternotomy, Bentall procedure, TEE on 06/15/2015 with Dr. Cyndia Bent.   See Dr. Vivi Martens notes 04/22/15 and 05/15/15 for further background and info.  Cardiologist is Dr. Lauree Chandler.   PMH includes:  Severe aortic stenosis (s/p AV replacement 1998), valvular heart disease, CHF, aortic aneurysm,  Palpitations, asthma, COPD, uses daily oxygen, post-op N/V, GERD. Hard of hearing. Former smoker. BMI 29. S/p ACDF 10/05/11.   Medications include: albuterol, symbicort, lasix, prilosec, potassium, simvastatin, spiriva.  Preoperative labs reviewed.    Chest x-ray 06/11/15 reviewed. COPD. There is no acute cardiopulmonary abnormality. Stable appearance of the ascending thoracic aorta.  EKG 05/21/15: NSR. Possible Left atrial enlargement  Cardiac cath 05/21/15:  1. Mild non-obstructive CAD 2. Dilated aortic root 3. Aortic insufficiency  Holter monitor 04/16/15:  - Sinus rhythm.  - Premature ventricular contractions (218 during the monitoring period) - Premature atrial contractions (300 during the monitoring period) - Several consecutive PVCs (couplets) - No atrial fibrillation  Echo 04/16/15:  - Left ventricle: The cavity size was normal. There was mild focal basal hypertrophy of the septum. Systolic function was normal. The estimated ejection fraction was in the range of 55% to 60%. Wall motion was normal; there were no regional wall motion abnormalities. Doppler parameters are consistent with abnormal left ventricular relaxation (grade 1 diastolic dysfunction). The E/e&' ratio is <8, suggesting normal LV filling pressure. - Aortic valve: Reported pulmonic valve homograft. Trileaflet; mildly calcified leaflets. There was no stenosis. There was moderate to severe regurgitation. Valve area (VTI): 2.67 cm^2. Valve area (Vmax): 2.71 cm^2. Valve area (Vmean): 2.6 cm^2. - Aortic root: The aortic root is dilated to 4.8 cm. -  Ascending aorta: The ascending aorta is dilated to 4.5 cm. - Mitral valve: Mildly thickened leaflets . There was mild regurgitation. - Atrial septum: No defect or patent foramen ovale was identified. - Tricuspid valve: There was trivial regurgitation. - Pulmonic valve: Poorly visualized. There was mild regurgitation. - Impressions: Compared to the prior study in 09/2014, there is more likely severe AI with a further dilated aortic root to 4.8 cm and ascending aorta to 4.5 cm. LVEF is higher at 55-60%.  If no changes, I anticipate pt can proceed with surgery as scheduled.   Willeen Cass, FNP-BC [facility]/Anesthesiology Phone: [phone] 06/11/2015 3:58 PM

## 2015-06-11 NOTE — Progress Notes (Signed)
Patient positive for antibodies.  Will need repeat type and screen day of surgery.

## 2015-06-11 NOTE — Progress Notes (Signed)
VASCULAR LAB PRELIMINARY  PRELIMINARY  PRELIMINARY  PRELIMINARY  Pre-op Cardiac Surgery  Carotid Findings:   Bilateral:  1-39% ICA stenosis.  Vertebral artery flow is antegrade.       Upper Extremity Right Left  Brachial Pressures Triphasic 155 Triphasic 148  Radial Waveforms Triphasic  Triphasic   Ulnar Waveforms Triphasic  Triphasic   Palmar Arch (Allen's Test) Within normal limits  Within normal limits      Dinna Severs, RVT 06/11/2015, 11:50 AM

## 2015-06-11 NOTE — Pre-Procedure Instructions (Signed)
    BEAUTY RIGHI  06/11/2015      [facility] - [place], [place] [address] Phone: [phone] Fax: [phone]    Your procedure is scheduled on Monday, April 17th, 2017.  Report to [facility] Admitting at 5:30 A.M.   Call this number if you have problems the morning of surgery:  [phone]   Remember:  Do not eat food or drink liquids after midnight.   Take these medicines the morning of surgery with A SIP OF WATER: Albuterol Nebulizer if needed, Albuterol inhaler if needed (please bring with you), Alprazolam (Xanax), Symbicort inhaler, Bupropion (Wellbutrin), Cetirizine (Zyrtec), Cyclobenzaprine (Flexeril) if needed, Omeprazole (Prilosec), Oxycodone-acetaminophen (Percocet) if needed OR Oxymorphone (Opana) if needed, Sertraline (Zoloft), Spiriva inhaler, eye drops, nasal spray if needed.  Stop taking: Meloxicam (Mobic), Aspirin, NSAIDS, Aleve, Naproxen, Ibuprofen, Advil, Motrin, BC's, Goody's, Fish oil, all herbal medications, and all vitamins.    Do not wear jewelry, make-up or nail polish.  Do not wear lotions, powders, or perfumes.  You may NOT wear deodorant.  Do not shave 48 hours prior to surgery.   Do not bring valuables to the hospital.  [facility] is not responsible for any belongings or valuables.  Contacts, dentures or bridgework may not be worn into surgery.  Leave your suitcase in the car.  After surgery it may be brought to your room.  For patients admitted to the hospital, discharge time will be determined by your treatment team.  Patients discharged the day of surgery will not be allowed to drive home.   Special instructions:  See attached.   Please read over the following fact sheets that you were given. Pain Booklet, Coughing and Deep Breathing, Blood Transfusion Information, MRSA Information and Surgical Site Infection Prevention

## 2015-06-11 NOTE — Progress Notes (Signed)
PCP - Dr. [name] Cardiologist - Dr. Julianne Handler  EKG- 05/21/15 CXR - 06/11/15  Echo - 04/2015 Cardiac Cath - 04/2015  Patient denies chest pain and shortness of breath at PAT appointment.

## 2015-06-12 LAB — HEMOGLOBIN A1C
Hgb A1c MFr Bld: 5.4 % (ref 4.8–5.6)
Mean Plasma Glucose: 108 mg/dL

## 2015-06-14 ENCOUNTER — Encounter (HOSPITAL_COMMUNITY): Payer: Self-pay | Admitting: Anesthesiology

## 2015-06-14 NOTE — Anesthesia Preprocedure Evaluation (Addendum)
Anesthesia Evaluation  Patient identified by MRN, date of birth, ID band Patient awake    Reviewed: Allergy & Precautions, H&P , NPO status   History of Anesthesia Complications (+) PONV and history of anesthetic complications  Airway Mallampati: I  TM Distance: >3 FB Neck ROM: Full    Dental  (+) Dental Advisory Given, Teeth Intact, Partial Upper   Pulmonary shortness of breath, asthma , COPD,  COPD inhaler, Current Smoker, former smoker,    breath sounds clear to auscultation       Cardiovascular + CAD, + Peripheral Vascular Disease and +CHF  + dysrhythmias + Valvular Problems/Murmurs AI  Rhythm:Regular Rate:Normal + Systolic murmurs and + Diastolic murmurs S/p Ross procedure at [facility] '98  Echo 04/2015 - Left ventricle: The cavity size was normal. There was mild focal basal hypertrophy of the septum. Systolic function was normal. The estimated ejection fraction was in the range of 55% to 60%. Wall motion was normal; there were no regional wall motion abnormalities. Doppler parameters are consistent with abnormal left ventricular relaxation (grade 1 diastolic dysfunction). The E/e&' ratio is <8, suggesting normal LV filling pressure. - Aortic valve: Reported pulmonic valve homograft. Trileaflet; mildly calcified leaflets. There was no stenosis. There was moderate to severe regurgitation. Valve area (VTI): 2.67 cm^2. Valve area (Vmax): 2.71 cm^2. Valve area (Vmean): 2.6 cm^2. - Aortic root: The aortic root is dilated to 4.8 cm. - Ascending aorta: The ascending aorta is dilated to 4.5 cm. - Mitral valve: Mildly thickened leaflets . There was mild  regurgitation. - Atrial septum: No defect or patent foramen ovale was identified. - Tricuspid valve: There was trivial regurgitation. - Pulmonic valve: Poorly visualized. There was mild regurgitation.  Impressions: - Compared to the prior study in 09/2014, there is more likely severe AI with  a further dilated aortic root to 4.8 cm and ascending aorta to 4.5 cm. LVEF is higher at 55-60%.   Neuro/Psych  Headaches, PSYCHIATRIC DISORDERS Anxiety Depression    GI/Hepatic GERD  ,  Endo/Other    Renal/GU      Musculoskeletal  (+) Arthritis ,   Abdominal   Peds  Hematology   Anesthesia Other Findings   Reproductive/Obstetrics                            Anesthesia Physical  Anesthesia Plan  ASA: IV  Anesthesia Plan: General   Post-op Pain Management:    Induction: Intravenous  Airway Management Planned: Oral ETT  Additional Equipment: Arterial line, PA Cath, TEE, CVP and Ultrasound Guidance Line Placement  Intra-op Plan:   Post-operative Plan: Post-operative intubation/ventilation  Informed Consent: I have reviewed the patients History and Physical, chart, labs and discussed the procedure including the risks, benefits and alternatives for the proposed anesthesia with the patient or authorized representative who has indicated his/her understanding and acceptance.   Dental advisory given  Plan Discussed with: CRNA  Anesthesia Plan Comments:         Anesthesia Quick Evaluation

## 2015-06-15 ENCOUNTER — Encounter (HOSPITAL_COMMUNITY): Payer: Self-pay | Admitting: *Deleted

## 2015-06-15 ENCOUNTER — Inpatient Hospital Stay (HOSPITAL_COMMUNITY): Payer: Medicaid Other | Admitting: Certified Registered Nurse Anesthetist

## 2015-06-15 ENCOUNTER — Encounter (HOSPITAL_COMMUNITY)
Admission: RE | Disposition: A | Discharge status: Home / Self Care | Payer: Self-pay | Source: Ambulatory Visit | Attending: Surgery

## 2015-06-15 ENCOUNTER — Inpatient Hospital Stay (HOSPITAL_COMMUNITY)
Admission: RE | Admit: 2015-06-15 | Discharge: 2015-06-23 | DRG: 220 | Disposition: A | Discharge status: Home / Self Care | Payer: Medicaid Other | Source: Ambulatory Visit | Attending: Surgery | Admitting: Surgery

## 2015-06-15 ENCOUNTER — Inpatient Hospital Stay (HOSPITAL_COMMUNITY): Payer: Medicaid Other

## 2015-06-15 DIAGNOSIS — Z833 Family history of diabetes mellitus: Secondary | ICD-10-CM | POA: Diagnosis not present

## 2015-06-15 DIAGNOSIS — Z809 Family history of malignant neoplasm, unspecified: Secondary | ICD-10-CM

## 2015-06-15 DIAGNOSIS — Z87891 Personal history of nicotine dependence: Secondary | ICD-10-CM

## 2015-06-15 DIAGNOSIS — Z791 Long term (current) use of non-steroidal anti-inflammatories (NSAID): Secondary | ICD-10-CM | POA: Diagnosis not present

## 2015-06-15 DIAGNOSIS — Z6829 Body mass index (BMI) 29.0-29.9, adult: Secondary | ICD-10-CM

## 2015-06-15 DIAGNOSIS — Z7951 Long term (current) use of inhaled steroids: Secondary | ICD-10-CM

## 2015-06-15 DIAGNOSIS — M1990 Unspecified osteoarthritis, unspecified site: Secondary | ICD-10-CM | POA: Diagnosis present

## 2015-06-15 DIAGNOSIS — J449 Chronic obstructive pulmonary disease, unspecified: Secondary | ICD-10-CM | POA: Diagnosis present

## 2015-06-15 DIAGNOSIS — Z952 Presence of prosthetic heart valve: Secondary | ICD-10-CM

## 2015-06-15 DIAGNOSIS — K5900 Constipation, unspecified: Secondary | ICD-10-CM | POA: Diagnosis not present

## 2015-06-15 DIAGNOSIS — I110 Hypertensive heart disease with heart failure: Secondary | ICD-10-CM | POA: Diagnosis present

## 2015-06-15 DIAGNOSIS — I509 Heart failure, unspecified: Secondary | ICD-10-CM | POA: Diagnosis present

## 2015-06-15 DIAGNOSIS — D62 Acute posthemorrhagic anemia: Secondary | ICD-10-CM | POA: Diagnosis not present

## 2015-06-15 DIAGNOSIS — Z8249 Family history of ischemic heart disease and other diseases of the circulatory system: Secondary | ICD-10-CM

## 2015-06-15 DIAGNOSIS — Z8679 Personal history of other diseases of the circulatory system: Secondary | ICD-10-CM | POA: Diagnosis not present

## 2015-06-15 DIAGNOSIS — G8929 Other chronic pain: Secondary | ICD-10-CM | POA: Diagnosis present

## 2015-06-15 DIAGNOSIS — Z825 Family history of asthma and other chronic lower respiratory diseases: Secondary | ICD-10-CM

## 2015-06-15 DIAGNOSIS — Z79899 Other long term (current) drug therapy: Secondary | ICD-10-CM | POA: Diagnosis not present

## 2015-06-15 DIAGNOSIS — E8770 Fluid overload, unspecified: Secondary | ICD-10-CM | POA: Diagnosis not present

## 2015-06-15 DIAGNOSIS — Z9981 Dependence on supplemental oxygen: Secondary | ICD-10-CM

## 2015-06-15 DIAGNOSIS — I351 Nonrheumatic aortic (valve) insufficiency: Secondary | ICD-10-CM

## 2015-06-15 DIAGNOSIS — I2510 Atherosclerotic heart disease of native coronary artery without angina pectoris: Secondary | ICD-10-CM | POA: Diagnosis present

## 2015-06-15 DIAGNOSIS — R0600 Dyspnea, unspecified: Secondary | ICD-10-CM

## 2015-06-15 DIAGNOSIS — I4892 Unspecified atrial flutter: Secondary | ICD-10-CM | POA: Diagnosis not present

## 2015-06-15 DIAGNOSIS — F329 Major depressive disorder, single episode, unspecified: Secondary | ICD-10-CM | POA: Diagnosis present

## 2015-06-15 DIAGNOSIS — J069 Acute upper respiratory infection, unspecified: Secondary | ICD-10-CM | POA: Diagnosis present

## 2015-06-15 DIAGNOSIS — F419 Anxiety disorder, unspecified: Secondary | ICD-10-CM | POA: Diagnosis present

## 2015-06-15 DIAGNOSIS — I712 Thoracic aortic aneurysm, without rupture, unspecified: Secondary | ICD-10-CM

## 2015-06-15 DIAGNOSIS — G43909 Migraine, unspecified, not intractable, without status migrainosus: Secondary | ICD-10-CM | POA: Diagnosis present

## 2015-06-15 DIAGNOSIS — K219 Gastro-esophageal reflux disease without esophagitis: Secondary | ICD-10-CM | POA: Diagnosis present

## 2015-06-15 DIAGNOSIS — D72829 Elevated white blood cell count, unspecified: Secondary | ICD-10-CM | POA: Diagnosis not present

## 2015-06-15 HISTORY — PX: BENTALL PROCEDURE: SHX5058

## 2015-06-15 HISTORY — PX: TEE WITHOUT CARDIOVERSION: SHX5443

## 2015-06-15 LAB — POCT I-STAT, CHEM 8
BUN: 17 mg/dL (ref 6–20)
BUN: 15 mg/dL (ref 6–20)
BUN: 22 mg/dL — ABNORMAL HIGH (ref 6–20)
BUN: 16 mg/dL (ref 6–20)
BUN: 18 mg/dL (ref 6–20)
BUN: 17 mg/dL (ref 6–20)
BUN: 17 mg/dL (ref 6–20)
BUN: 17 mg/dL (ref 6–20)
CALCIUM ION: 1.2 mmol/L (ref 1.12–1.23)
CALCIUM ION: 0.96 mmol/L — AB (ref 1.12–1.23)
CHLORIDE: 97 mmol/L — AB (ref 101–111)
CHLORIDE: 97 mmol/L — AB (ref 101–111)
CHLORIDE: 99 mmol/L — AB (ref 101–111)
CREATININE: 0.4 mg/dL — AB (ref 0.44–1.00)
CREATININE: 0.4 mg/dL — AB (ref 0.44–1.00)
CREATININE: 0.8 mg/dL (ref 0.44–1.00)
Calcium, Ion: 0.99 mmol/L — ABNORMAL LOW (ref 1.12–1.23)
Calcium, Ion: 1.14 mmol/L (ref 1.12–1.23)
Calcium, Ion: 1 mmol/L — ABNORMAL LOW (ref 1.12–1.23)
Calcium, Ion: 0.95 mmol/L — ABNORMAL LOW (ref 1.12–1.23)
Calcium, Ion: 0.97 mmol/L — ABNORMAL LOW (ref 1.12–1.23)
Calcium, Ion: 1.17 mmol/L (ref 1.12–1.23)
Chloride: 104 mmol/L (ref 101–111)
Chloride: 101 mmol/L (ref 101–111)
Chloride: 104 mmol/L (ref 101–111)
Chloride: 99 mmol/L — ABNORMAL LOW (ref 101–111)
Chloride: 107 mmol/L (ref 101–111)
Creatinine, Ser: 0.5 mg/dL (ref 0.44–1.00)
Creatinine, Ser: 0.4 mg/dL — ABNORMAL LOW (ref 0.44–1.00)
Creatinine, Ser: 0.5 mg/dL (ref 0.44–1.00)
Creatinine, Ser: 0.4 mg/dL — ABNORMAL LOW (ref 0.44–1.00)
Creatinine, Ser: 0.4 mg/dL — ABNORMAL LOW (ref 0.44–1.00)
GLUCOSE: 98 mg/dL (ref 65–99)
GLUCOSE: 124 mg/dL — AB (ref 65–99)
Glucose, Bld: 91 mg/dL (ref 65–99)
Glucose, Bld: 100 mg/dL — ABNORMAL HIGH (ref 65–99)
Glucose, Bld: 90 mg/dL (ref 65–99)
Glucose, Bld: 104 mg/dL — ABNORMAL HIGH (ref 65–99)
Glucose, Bld: 122 mg/dL — ABNORMAL HIGH (ref 65–99)
Glucose, Bld: 170 mg/dL — ABNORMAL HIGH (ref 65–99)
HCT: 25 % — ABNORMAL LOW (ref 36.0–46.0)
HCT: 22 % — ABNORMAL LOW (ref 36.0–46.0)
HCT: 26 % — ABNORMAL LOW (ref 36.0–46.0)
HCT: 31 % — ABNORMAL LOW (ref 36.0–46.0)
HEMATOCRIT: 35 % — AB (ref 36.0–46.0)
HEMATOCRIT: 26 % — AB (ref 36.0–46.0)
HEMATOCRIT: 32 % — AB (ref 36.0–46.0)
HEMATOCRIT: 22 % — AB (ref 36.0–46.0)
HEMOGLOBIN: 11.9 g/dL — AB (ref 12.0–15.0)
HEMOGLOBIN: 10.9 g/dL — AB (ref 12.0–15.0)
HEMOGLOBIN: 10.5 g/dL — AB (ref 12.0–15.0)
Hemoglobin: 8.8 g/dL — ABNORMAL LOW (ref 12.0–15.0)
Hemoglobin: 8.5 g/dL — ABNORMAL LOW (ref 12.0–15.0)
Hemoglobin: 7.5 g/dL — ABNORMAL LOW (ref 12.0–15.0)
Hemoglobin: 7.5 g/dL — ABNORMAL LOW (ref 12.0–15.0)
Hemoglobin: 8.8 g/dL — ABNORMAL LOW (ref 12.0–15.0)
POTASSIUM: 3.8 mmol/L (ref 3.5–5.1)
POTASSIUM: 4.6 mmol/L (ref 3.5–5.1)
POTASSIUM: 4.3 mmol/L (ref 3.5–5.1)
POTASSIUM: 4.7 mmol/L (ref 3.5–5.1)
POTASSIUM: 5.2 mmol/L — AB (ref 3.5–5.1)
POTASSIUM: 5.1 mmol/L (ref 3.5–5.1)
Potassium: 3.7 mmol/L (ref 3.5–5.1)
Potassium: 4 mmol/L (ref 3.5–5.1)
SODIUM: 142 mmol/L (ref 135–145)
SODIUM: 141 mmol/L (ref 135–145)
SODIUM: 142 mmol/L (ref 135–145)
SODIUM: 132 mmol/L — AB (ref 135–145)
Sodium: 134 mmol/L — ABNORMAL LOW (ref 135–145)
Sodium: 133 mmol/L — ABNORMAL LOW (ref 135–145)
Sodium: 136 mmol/L (ref 135–145)
Sodium: 141 mmol/L (ref 135–145)
TCO2: 28 mmol/L (ref 0–100)
TCO2: 26 mmol/L (ref 0–100)
TCO2: 29 mmol/L (ref 0–100)
TCO2: 27 mmol/L (ref 0–100)
TCO2: 25 mmol/L (ref 0–100)
TCO2: 25 mmol/L (ref 0–100)
TCO2: 26 mmol/L (ref 0–100)
TCO2: 22 mmol/L (ref 0–100)

## 2015-06-15 LAB — GLUCOSE, CAPILLARY
GLUCOSE-CAPILLARY: 115 mg/dL — AB (ref 65–99)
GLUCOSE-CAPILLARY: 130 mg/dL — AB (ref 65–99)
Glucose-Capillary: 120 mg/dL — ABNORMAL HIGH (ref 65–99)
Glucose-Capillary: 126 mg/dL — ABNORMAL HIGH (ref 65–99)

## 2015-06-15 LAB — POCT I-STAT 3, ART BLOOD GAS (G3+)
ACID-BASE DEFICIT: 5 mmol/L — AB (ref 0.0–2.0)
ACID-BASE DEFICIT: 5 mmol/L — AB (ref 0.0–2.0)
ACID-BASE EXCESS: 1 mmol/L (ref 0.0–2.0)
Acid-Base Excess: 1 mmol/L (ref 0.0–2.0)
Acid-base deficit: 1 mmol/L (ref 0.0–2.0)
Acid-base deficit: 2 mmol/L (ref 0.0–2.0)
Acid-base deficit: 5 mmol/L — ABNORMAL HIGH (ref 0.0–2.0)
BICARBONATE: 26.4 meq/L — AB (ref 20.0–24.0)
BICARBONATE: 29 meq/L — AB (ref 20.0–24.0)
BICARBONATE: 26.5 meq/L — AB (ref 20.0–24.0)
BICARBONATE: 22.3 meq/L (ref 20.0–24.0)
BICARBONATE: 21 meq/L (ref 20.0–24.0)
Bicarbonate: 24 mEq/L (ref 20.0–24.0)
Bicarbonate: 21.5 mEq/L (ref 20.0–24.0)
O2 SAT: 95 %
O2 Saturation: 100 %
O2 Saturation: 100 %
O2 Saturation: 100 %
O2 Saturation: 99 %
O2 Saturation: 96 %
O2 Saturation: 95 %
PCO2 ART: 46.8 mmHg — AB (ref 35.0–45.0)
PCO2 ART: 70.4 mmHg — AB (ref 35.0–45.0)
PCO2 ART: 46.4 mmHg — AB (ref 35.0–45.0)
PH ART: 7.359 (ref 7.350–7.450)
PH ART: 7.309 — AB (ref 7.350–7.450)
PH ART: 7.309 — AB (ref 7.350–7.450)
PO2 ART: 375 mmHg — AB (ref 80.0–100.0)
PO2 ART: 463 mmHg — AB (ref 80.0–100.0)
PO2 ART: 84 mmHg (ref 80.0–100.0)
Patient temperature: 36.4
TCO2: 28 mmol/L (ref 0–100)
TCO2: 31 mmol/L (ref 0–100)
TCO2: 28 mmol/L (ref 0–100)
TCO2: 25 mmol/L (ref 0–100)
TCO2: 23 mmol/L (ref 0–100)
TCO2: 24 mmol/L (ref 0–100)
TCO2: 22 mmol/L (ref 0–100)
pCO2 arterial: 51.6 mmHg — ABNORMAL HIGH (ref 35.0–45.0)
pCO2 arterial: 47.5 mmHg — ABNORMAL HIGH (ref 35.0–45.0)
pCO2 arterial: 48.5 mmHg — ABNORMAL HIGH (ref 35.0–45.0)
pCO2 arterial: 41.7 mmHg (ref 35.0–45.0)
pH, Arterial: 7.223 — ABNORMAL LOW (ref 7.350–7.450)
pH, Arterial: 7.313 — ABNORMAL LOW (ref 7.350–7.450)
pH, Arterial: 7.271 — ABNORMAL LOW (ref 7.350–7.450)
pH, Arterial: 7.268 — ABNORMAL LOW (ref 7.350–7.450)
pO2, Arterial: 189 mmHg — ABNORMAL HIGH (ref 80.0–100.0)
pO2, Arterial: 141 mmHg — ABNORMAL HIGH (ref 80.0–100.0)
pO2, Arterial: 94 mmHg (ref 80.0–100.0)
pO2, Arterial: 84 mmHg (ref 80.0–100.0)

## 2015-06-15 LAB — CBC
HEMATOCRIT: 32 % — AB (ref 36.0–46.0)
HEMATOCRIT: 32 % — AB (ref 36.0–46.0)
Hemoglobin: 10.3 g/dL — ABNORMAL LOW (ref 12.0–15.0)
Hemoglobin: 10.4 g/dL — ABNORMAL LOW (ref 12.0–15.0)
MCH: 28.6 pg (ref 26.0–34.0)
MCH: 28.8 pg (ref 26.0–34.0)
MCHC: 32.2 g/dL (ref 30.0–36.0)
MCHC: 32.5 g/dL (ref 30.0–36.0)
MCV: 88.9 fL (ref 78.0–100.0)
MCV: 88.6 fL (ref 78.0–100.0)
Platelets: 143 10*3/uL — ABNORMAL LOW (ref 150–400)
Platelets: 139 10*3/uL — ABNORMAL LOW (ref 150–400)
RBC: 3.6 MIL/uL — ABNORMAL LOW (ref 3.87–5.11)
RBC: 3.61 MIL/uL — ABNORMAL LOW (ref 3.87–5.11)
RDW: 12.9 % (ref 11.5–15.5)
RDW: 13.1 % (ref 11.5–15.5)
WBC: 9.4 10*3/uL (ref 4.0–10.5)
WBC: 9.4 10*3/uL (ref 4.0–10.5)

## 2015-06-15 LAB — POCT I-STAT 4, (NA,K, GLUC, HGB,HCT)
GLUCOSE: 128 mg/dL — AB (ref 65–99)
HCT: 31 % — ABNORMAL LOW (ref 36.0–46.0)
HEMOGLOBIN: 10.5 g/dL — AB (ref 12.0–15.0)
POTASSIUM: 3.9 mmol/L (ref 3.5–5.1)
Sodium: 141 mmol/L (ref 135–145)

## 2015-06-15 LAB — CREATININE, SERUM
Creatinine, Ser: 0.95 mg/dL (ref 0.44–1.00)
GFR calc Af Amer: >60 mL/min (ref >60)

## 2015-06-15 LAB — TYPE AND SCREEN
ABO/RH(D): A POS
ANTIBODY SCREEN: POSITIVE

## 2015-06-15 LAB — PLATELET COUNT: Platelets: 115 10*3/uL — ABNORMAL LOW (ref 150–400)

## 2015-06-15 LAB — PREPARE RBC (CROSSMATCH)

## 2015-06-15 LAB — APTT: aPTT: 37 seconds (ref 24–37)

## 2015-06-15 LAB — MAGNESIUM: Magnesium: 3.4 mg/dL — ABNORMAL HIGH (ref 1.7–2.4)

## 2015-06-15 LAB — FIBRINOGEN: FIBRINOGEN: 195 mg/dL — AB (ref 204–475)

## 2015-06-15 LAB — HEMOGLOBIN AND HEMATOCRIT, BLOOD
HCT: 22.2 % — ABNORMAL LOW (ref 36.0–46.0)
HEMOGLOBIN: 7.3 g/dL — AB (ref 12.0–15.0)

## 2015-06-15 LAB — PROTIME-INR
INR: 1.62 — ABNORMAL HIGH (ref 0.00–1.49)
Prothrombin Time: 19.3 seconds — ABNORMAL HIGH (ref 11.6–15.2)

## 2015-06-15 SURGERY — REDO STERNOTOMY
Anesthesia: General | Site: Chest

## 2015-06-15 MED ORDER — DEXTROSE 5 % IV SOLN
10.0000 mg | INTRAVENOUS | Status: DC | PRN
  Administered 2015-06-15: 25 ug/min via INTRAVENOUS

## 2015-06-15 MED ORDER — 0.9 % SODIUM CHLORIDE (POUR BTL) OPTIME
TOPICAL | Status: DC | PRN
  Administered 2015-06-15: 6000 mL

## 2015-06-15 MED ORDER — ASPIRIN EC 325 MG PO TBEC: 325.0000 mg | DELAYED_RELEASE_TABLET | Freq: Every day | ORAL | Status: DC

## 2015-06-15 MED ORDER — ALBUTEROL SULFATE (2.5 MG/3ML) 0.083% IN NEBU
2.5000 mg | INHALATION_SOLUTION | Freq: Four times a day (QID) | RESPIRATORY_TRACT | Status: DC | PRN
  Administered 2015-06-15 – 2015-06-17 (×2): 2.5 mg via RESPIRATORY_TRACT
  Filled 2015-06-15 (×2): qty 3

## 2015-06-15 MED ORDER — ACETAMINOPHEN 160 MG/5ML PO SOLN: 1000.0000 mg | Freq: Four times a day (QID) | ORAL | Status: DC

## 2015-06-15 MED ORDER — LACTATED RINGERS IV SOLN: INTRAVENOUS | Status: DC

## 2015-06-15 MED ORDER — ONDANSETRON HCL 4 MG/2ML IJ SOLN
4.0000 mg | Freq: Four times a day (QID) | INTRAMUSCULAR | Status: DC | PRN
  Administered 2015-06-16: 4 mg via INTRAVENOUS
  Filled 2015-06-15: qty 2

## 2015-06-15 MED ORDER — SODIUM CHLORIDE 0.9 % IV SOLN
INTRAVENOUS | Status: DC
  Filled 2015-06-15: qty 30

## 2015-06-15 MED ORDER — DIPHENHYDRAMINE HCL 50 MG/ML IJ SOLN
INTRAMUSCULAR | Status: AC
  Filled 2015-06-15: qty 1

## 2015-06-15 MED ORDER — MIDAZOLAM HCL 10 MG/2ML IJ SOLN
INTRAMUSCULAR | Status: AC
  Filled 2015-06-15: qty 2

## 2015-06-15 MED ORDER — ASPIRIN 81 MG PO CHEW: 324.0000 mg | CHEWABLE_TABLET | Freq: Every day | ORAL | Status: DC

## 2015-06-15 MED ORDER — DOCUSATE SODIUM 100 MG PO CAPS
200.0000 mg | ORAL_CAPSULE | Freq: Every day | ORAL | Status: DC
  Administered 2015-06-16 – 2015-06-17 (×2): 200 mg via ORAL
  Filled 2015-06-15 (×2): qty 2

## 2015-06-15 MED ORDER — VANCOMYCIN HCL IN DEXTROSE 1-5 GM/200ML-% IV SOLN
1000.0000 mg | Freq: Once | INTRAVENOUS | Status: AC
  Administered 2015-06-15: 1000 mg via INTRAVENOUS
  Filled 2015-06-15: qty 200

## 2015-06-15 MED ORDER — HEPARIN SODIUM (PORCINE) 1000 UNIT/ML IJ SOLN
INTRAMUSCULAR | Status: DC | PRN
  Administered 2015-06-15: 25000 [IU] via INTRAVENOUS

## 2015-06-15 MED ORDER — FAMOTIDINE IN NACL 20-0.9 MG/50ML-% IV SOLN
20.0000 mg | Freq: Two times a day (BID) | INTRAVENOUS | Status: AC
  Administered 2015-06-15: 20 mg via INTRAVENOUS

## 2015-06-15 MED ORDER — DEXMEDETOMIDINE HCL IN NACL 400 MCG/100ML IV SOLN
0.1000 ug/kg/h | INTRAVENOUS | Status: DC
  Filled 2015-06-15: qty 100

## 2015-06-15 MED ORDER — MIDAZOLAM HCL 2 MG/2ML IJ SOLN: 2.0000 mg | INTRAMUSCULAR | Status: DC | PRN

## 2015-06-15 MED ORDER — LACTATED RINGERS IV SOLN
INTRAVENOUS | Status: DC | PRN
  Administered 2015-06-15: 07:00:00 via INTRAVENOUS

## 2015-06-15 MED ORDER — SODIUM CHLORIDE 0.9 % IJ SOLN
INTRAMUSCULAR | Status: AC
  Filled 2015-06-15: qty 10

## 2015-06-15 MED ORDER — NITROGLYCERIN IN D5W 200-5 MCG/ML-% IV SOLN
2.0000 ug/min | INTRAVENOUS | Status: AC
  Administered 2015-06-15: 20 ug/min via INTRAVENOUS
  Filled 2015-06-15: qty 250

## 2015-06-15 MED ORDER — NITROGLYCERIN IN D5W 200-5 MCG/ML-% IV SOLN
0.0000 ug/min | INTRAVENOUS | Status: DC
Start: 2015-06-15 — End: 2015-06-17

## 2015-06-15 MED ORDER — PHENYLEPHRINE HCL 10 MG/ML IJ SOLN
0.0000 ug/min | INTRAVENOUS | Status: DC
  Filled 2015-06-15: qty 2

## 2015-06-15 MED ORDER — ACETAMINOPHEN 500 MG PO TABS
1000.0000 mg | ORAL_TABLET | Freq: Four times a day (QID) | ORAL | Status: DC
  Administered 2015-06-16 – 2015-06-17 (×5): 1000 mg via ORAL
  Filled 2015-06-15 (×6): qty 2

## 2015-06-15 MED ORDER — CEFUROXIME SODIUM 1.5 G IJ SOLR
1.5000 g | Freq: Two times a day (BID) | INTRAMUSCULAR | Status: AC
  Administered 2015-06-16 – 2015-06-17 (×4): 1.5 g via INTRAVENOUS
  Filled 2015-06-15 (×4): qty 1.5

## 2015-06-15 MED ORDER — SODIUM CHLORIDE 0.9 % IV SOLN: 250.0000 mL | INTRAVENOUS | Status: DC

## 2015-06-15 MED ORDER — METHYLPREDNISOLONE SODIUM SUCC 125 MG IJ SOLR
INTRAMUSCULAR | Status: DC | PRN
  Administered 2015-06-15: 125 mg via INTRAVENOUS

## 2015-06-15 MED ORDER — LACTATED RINGERS IV SOLN
INTRAVENOUS | Status: DC | PRN
  Administered 2015-06-15 (×2): via INTRAVENOUS

## 2015-06-15 MED ORDER — SODIUM CHLORIDE 0.9 % IV SOLN: Freq: Once | INTRAVENOUS | Status: DC

## 2015-06-15 MED ORDER — SODIUM CHLORIDE 0.9% FLUSH
3.0000 mL | Freq: Two times a day (BID) | INTRAVENOUS | Status: DC
  Administered 2015-06-16 – 2015-06-17 (×3): 3 mL via INTRAVENOUS

## 2015-06-15 MED ORDER — POTASSIUM CHLORIDE 10 MEQ/50ML IV SOLN
10.0000 meq | INTRAVENOUS | Status: AC
  Administered 2015-06-15 (×3): 10 meq via INTRAVENOUS

## 2015-06-15 MED ORDER — PLASMA-LYTE 148 IV SOLN
INTRAVENOUS | Status: DC
  Filled 2015-06-15: qty 2.5

## 2015-06-15 MED ORDER — VANCOMYCIN HCL 1000 MG IV SOLR
1000.0000 mg | INTRAVENOUS | Status: DC | PRN
  Administered 2015-06-15: 1250 mg via INTRAVENOUS

## 2015-06-15 MED ORDER — CHLORHEXIDINE GLUCONATE 0.12% ORAL RINSE (MEDLINE KIT)
15.0000 mL | Freq: Two times a day (BID) | OROMUCOSAL | Status: DC
  Administered 2015-06-15: 15 mL via OROMUCOSAL

## 2015-06-15 MED ORDER — CHLORHEXIDINE GLUCONATE 4 % EX LIQD: 30.0000 mL | CUTANEOUS | Status: DC

## 2015-06-15 MED ORDER — POTASSIUM CHLORIDE 2 MEQ/ML IV SOLN
80.0000 meq | INTRAVENOUS | Status: DC
  Filled 2015-06-15: qty 40

## 2015-06-15 MED ORDER — TRAMADOL HCL 50 MG PO TABS
50.0000 mg | ORAL_TABLET | ORAL | Status: DC | PRN
  Administered 2015-06-17 (×2): 100 mg via ORAL
  Administered 2015-06-17: 50 mg via ORAL
  Filled 2015-06-15: qty 1
  Filled 2015-06-15 (×2): qty 2

## 2015-06-15 MED ORDER — ALPRAZOLAM 0.5 MG PO TABS
1.0000 mg | ORAL_TABLET | Freq: Every evening | ORAL | Status: DC | PRN
  Administered 2015-06-15: 1 mg via ORAL
  Filled 2015-06-15: qty 2

## 2015-06-15 MED ORDER — ROCURONIUM BROMIDE 100 MG/10ML IV SOLN
INTRAVENOUS | Status: DC | PRN
  Administered 2015-06-15: 20 mg via INTRAVENOUS
  Administered 2015-06-15 (×2): 30 mg via INTRAVENOUS
  Administered 2015-06-15: 50 mg via INTRAVENOUS

## 2015-06-15 MED ORDER — FENTANYL CITRATE (PF) 250 MCG/5ML IJ SOLN
INTRAMUSCULAR | Status: AC
  Filled 2015-06-15: qty 5

## 2015-06-15 MED ORDER — DEXMEDETOMIDINE HCL IN NACL 200 MCG/50ML IV SOLN
0.0000 ug/kg/h | INTRAVENOUS | Status: DC
  Administered 2015-06-15: 0.5 ug/kg/h via INTRAVENOUS
  Filled 2015-06-15 (×2): qty 50

## 2015-06-15 MED ORDER — PHENYLEPHRINE HCL 10 MG/ML IJ SOLN
30.0000 ug/min | INTRAVENOUS | Status: DC
  Filled 2015-06-15: qty 2

## 2015-06-15 MED ORDER — SODIUM CHLORIDE 0.9 % IV SOLN
INTRAVENOUS | Status: DC
  Filled 2015-06-15: qty 2.5

## 2015-06-15 MED ORDER — BISACODYL 10 MG RE SUPP: 10.0000 mg | Freq: Every day | RECTAL | Status: DC

## 2015-06-15 MED ORDER — THROMBIN 20000 UNITS EX SOLR
CUTANEOUS | Status: DC | PRN
  Administered 2015-06-15: 20000 [IU] via TOPICAL

## 2015-06-15 MED ORDER — SODIUM CHLORIDE 0.9 % IV SOLN
INTRAVENOUS | Status: DC
  Filled 2015-06-15: qty 40

## 2015-06-15 MED ORDER — HEPARIN SODIUM (PORCINE) 1000 UNIT/ML IJ SOLN
INTRAMUSCULAR | Status: AC
  Filled 2015-06-15: qty 1

## 2015-06-15 MED ORDER — SODIUM CHLORIDE 0.9% FLUSH: 3.0000 mL | INTRAVENOUS | Status: DC | PRN

## 2015-06-15 MED ORDER — PROPOFOL 10 MG/ML IV BOLUS
INTRAVENOUS | Status: AC
  Filled 2015-06-15: qty 20

## 2015-06-15 MED ORDER — SODIUM CHLORIDE 0.9 % IV SOLN
INTRAVENOUS | Status: DC
Start: 2015-06-15 — End: 2015-06-17

## 2015-06-15 MED ORDER — OXYCODONE HCL 5 MG PO TABS
5.0000 mg | ORAL_TABLET | ORAL | Status: DC | PRN
  Administered 2015-06-15: 5 mg via ORAL
  Administered 2015-06-16 (×2): 10 mg via ORAL
  Administered 2015-06-16: 5 mg via ORAL
  Administered 2015-06-16 (×2): 10 mg via ORAL
  Administered 2015-06-16: 5 mg via ORAL
  Administered 2015-06-17 (×2): 10 mg via ORAL
  Filled 2015-06-15: qty 2
  Filled 2015-06-15: qty 1
  Filled 2015-06-15: qty 2
  Filled 2015-06-15 (×2): qty 1
  Filled 2015-06-15 (×4): qty 2

## 2015-06-15 MED ORDER — LACTATED RINGERS IV SOLN: 500.0000 mL | Freq: Once | INTRAVENOUS | Status: DC | PRN

## 2015-06-15 MED ORDER — AMINOCAPROIC ACID 250 MG/ML IV SOLN
10.0000 g | INTRAVENOUS | Status: DC | PRN
  Administered 2015-06-15: 5 g via INTRAVENOUS

## 2015-06-15 MED ORDER — PANTOPRAZOLE SODIUM 40 MG PO TBEC: 40.0000 mg | DELAYED_RELEASE_TABLET | Freq: Every day | ORAL | Status: DC

## 2015-06-15 MED ORDER — CHLORHEXIDINE GLUCONATE 0.12 % MT SOLN
15.0000 mL | OROMUCOSAL | Status: AC
  Administered 2015-06-15: 15 mL via OROMUCOSAL

## 2015-06-15 MED ORDER — PHENYLEPHRINE 40 MCG/ML (10ML) SYRINGE FOR IV PUSH (FOR BLOOD PRESSURE SUPPORT)
PREFILLED_SYRINGE | INTRAVENOUS | Status: AC
  Filled 2015-06-15: qty 10

## 2015-06-15 MED ORDER — ACETAMINOPHEN 650 MG RE SUPP
650.0000 mg | Freq: Once | RECTAL | Status: AC
Start: 2015-06-15 — End: 2015-06-15
  Administered 2015-06-15: 650 mg via RECTAL

## 2015-06-15 MED ORDER — ANTISEPTIC ORAL RINSE SOLUTION (CORINZ)
7.0000 mL | Freq: Four times a day (QID) | OROMUCOSAL | Status: DC
  Administered 2015-06-16 (×2): 7 mL via OROMUCOSAL

## 2015-06-15 MED ORDER — DEXTROSE 5 % IV SOLN
1.5000 g | INTRAVENOUS | Status: AC
  Administered 2015-06-15: 1.5 g via INTRAVENOUS
  Administered 2015-06-15: .75 g via INTRAVENOUS
  Filled 2015-06-15: qty 1.5

## 2015-06-15 MED ORDER — PROTAMINE SULFATE 10 MG/ML IV SOLN
INTRAVENOUS | Status: AC
  Filled 2015-06-15: qty 25

## 2015-06-15 MED ORDER — SODIUM CHLORIDE 0.9 % IV SOLN
0.5000 g/h | Freq: Once | INTRAVENOUS | Status: DC
  Filled 2015-06-15: qty 20

## 2015-06-15 MED ORDER — ROCURONIUM BROMIDE 50 MG/5ML IV SOLN
INTRAVENOUS | Status: AC
  Filled 2015-06-15: qty 1

## 2015-06-15 MED ORDER — DOPAMINE-DEXTROSE 3.2-5 MG/ML-% IV SOLN
0.0000 ug/kg/min | INTRAVENOUS | Status: DC
  Filled 2015-06-15: qty 250

## 2015-06-15 MED ORDER — HEMOSTATIC AGENTS (NO CHARGE) OPTIME
TOPICAL | Status: DC | PRN
  Administered 2015-06-15 (×3): 1 via TOPICAL

## 2015-06-15 MED ORDER — THROMBIN 20000 UNITS EX SOLR
OROMUCOSAL | Status: DC | PRN
  Administered 2015-06-15 (×3): 4 mL via TOPICAL

## 2015-06-15 MED ORDER — DIPHENHYDRAMINE HCL 50 MG/ML IJ SOLN
INTRAMUSCULAR | Status: DC | PRN
  Administered 2015-06-15: 50 mg via INTRAVENOUS

## 2015-06-15 MED ORDER — EPHEDRINE SULFATE 50 MG/ML IJ SOLN
INTRAMUSCULAR | Status: AC
  Filled 2015-06-15: qty 1

## 2015-06-15 MED ORDER — SERTRALINE HCL 100 MG PO TABS
100.0000 mg | ORAL_TABLET | Freq: Every day | ORAL | Status: DC
  Administered 2015-06-16 – 2015-06-23 (×8): 100 mg via ORAL
  Filled 2015-06-15 (×8): qty 1

## 2015-06-15 MED ORDER — DIPHENHYDRAMINE HCL 50 MG/ML IJ SOLN
25.0000 mg | Freq: Four times a day (QID) | INTRAMUSCULAR | Status: DC | PRN
  Administered 2015-06-15 – 2015-06-16 (×2): 25 mg via INTRAVENOUS
  Filled 2015-06-15: qty 1

## 2015-06-15 MED ORDER — FENTANYL CITRATE (PF) 100 MCG/2ML IJ SOLN
INTRAMUSCULAR | Status: DC | PRN
  Administered 2015-06-15 (×3): 250 ug via INTRAVENOUS
  Administered 2015-06-15: 200 ug via INTRAVENOUS
  Administered 2015-06-15: 100 ug via INTRAVENOUS
  Administered 2015-06-15: 150 ug via INTRAVENOUS
  Administered 2015-06-15: 250 ug via INTRAVENOUS
  Administered 2015-06-15: 50 ug via INTRAVENOUS

## 2015-06-15 MED ORDER — MIDAZOLAM HCL 5 MG/5ML IJ SOLN
INTRAMUSCULAR | Status: DC | PRN
  Administered 2015-06-15 (×3): 2 mg via INTRAVENOUS
  Administered 2015-06-15: 4 mg via INTRAVENOUS

## 2015-06-15 MED ORDER — PROPOFOL 10 MG/ML IV BOLUS
INTRAVENOUS | Status: DC | PRN
  Administered 2015-06-15 (×3): 50 mg via INTRAVENOUS

## 2015-06-15 MED ORDER — CEFUROXIME SODIUM 750 MG IJ SOLR
750.0000 mg | INTRAMUSCULAR | Status: DC
  Filled 2015-06-15: qty 750

## 2015-06-15 MED ORDER — DEXMEDETOMIDINE HCL IN NACL 200 MCG/50ML IV SOLN
INTRAVENOUS | Status: DC | PRN
  Administered 2015-06-15: .2 ug/kg/h via INTRAVENOUS

## 2015-06-15 MED ORDER — EPINEPHRINE HCL 1 MG/ML IJ SOLN
0.0000 ug/min | INTRAVENOUS | Status: DC
  Filled 2015-06-15: qty 4

## 2015-06-15 MED ORDER — THROMBIN 20000 UNITS EX SOLR
CUTANEOUS | Status: AC
  Filled 2015-06-15: qty 20000

## 2015-06-15 MED ORDER — MORPHINE SULFATE (PF) 2 MG/ML IV SOLN: 1.0000 mg | INTRAVENOUS | Status: DC | PRN

## 2015-06-15 MED ORDER — ALBUMIN HUMAN 5 % IV SOLN
250.0000 mL | INTRAVENOUS | Status: AC | PRN
  Administered 2015-06-15 (×2): 250 mL via INTRAVENOUS

## 2015-06-15 MED ORDER — LIDOCAINE HCL (CARDIAC) 20 MG/ML IV SOLN
INTRAVENOUS | Status: AC
  Filled 2015-06-15: qty 5

## 2015-06-15 MED ORDER — MAGNESIUM SULFATE 4 GM/100ML IV SOLN
4.0000 g | Freq: Once | INTRAVENOUS | Status: AC
  Administered 2015-06-15: 4 g via INTRAVENOUS
  Filled 2015-06-15: qty 100

## 2015-06-15 MED ORDER — VANCOMYCIN HCL 10 G IV SOLR
1250.0000 mg | INTRAVENOUS | Status: DC
  Filled 2015-06-15: qty 1250

## 2015-06-15 MED ORDER — SODIUM CHLORIDE 0.45 % IV SOLN
INTRAVENOUS | Status: DC | PRN
  Administered 2015-06-15: 15:00:00 via INTRAVENOUS

## 2015-06-15 MED ORDER — INSULIN REGULAR BOLUS VIA INFUSION
0.0000 [IU] | Freq: Three times a day (TID) | INTRAVENOUS | Status: DC
  Filled 2015-06-15: qty 10

## 2015-06-15 MED ORDER — ONDANSETRON HCL 4 MG/2ML IJ SOLN
INTRAMUSCULAR | Status: AC
  Filled 2015-06-15: qty 2

## 2015-06-15 MED ORDER — FENTANYL CITRATE (PF) 250 MCG/5ML IJ SOLN
INTRAMUSCULAR | Status: AC
  Filled 2015-06-15: qty 15

## 2015-06-15 MED ORDER — BISACODYL 5 MG PO TBEC
10.0000 mg | DELAYED_RELEASE_TABLET | Freq: Every day | ORAL | Status: DC
  Administered 2015-06-16 – 2015-06-17 (×2): 10 mg via ORAL
  Filled 2015-06-15 (×2): qty 2

## 2015-06-15 MED ORDER — ACETAMINOPHEN 160 MG/5ML PO SOLN: 650.0000 mg | Freq: Once | ORAL | Status: AC

## 2015-06-15 MED ORDER — SODIUM CHLORIDE 0.9 % IV SOLN
250.0000 [IU] | INTRAVENOUS | Status: DC | PRN
  Administered 2015-06-15: .5 [IU]/h via INTRAVENOUS

## 2015-06-15 MED ORDER — LIDOCAINE HCL (CARDIAC) 20 MG/ML IV SOLN
INTRAVENOUS | Status: DC | PRN
  Administered 2015-06-15: 100 mg via INTRAVENOUS

## 2015-06-15 MED ORDER — MAGNESIUM SULFATE 50 % IJ SOLN
40.0000 meq | INTRAMUSCULAR | Status: DC
  Filled 2015-06-15: qty 10

## 2015-06-15 MED ORDER — PROTAMINE SULFATE 10 MG/ML IV SOLN
INTRAVENOUS | Status: DC | PRN
  Administered 2015-06-15: 210 mg via INTRAVENOUS

## 2015-06-15 MED ORDER — MORPHINE SULFATE (PF) 2 MG/ML IV SOLN
2.0000 mg | INTRAVENOUS | Status: DC | PRN
  Administered 2015-06-15: 2 mg via INTRAVENOUS
  Administered 2015-06-15 – 2015-06-16 (×2): 4 mg via INTRAVENOUS
  Administered 2015-06-16: 2 mg via INTRAVENOUS
  Administered 2015-06-16: 4 mg via INTRAVENOUS
  Administered 2015-06-16: 2 mg via INTRAVENOUS
  Administered 2015-06-16: 4 mg via INTRAVENOUS
  Administered 2015-06-16 – 2015-06-17 (×4): 2 mg via INTRAVENOUS
  Filled 2015-06-15: qty 1
  Filled 2015-06-15 (×2): qty 2
  Filled 2015-06-15: qty 1
  Filled 2015-06-15: qty 3
  Filled 2015-06-15 (×4): qty 1
  Filled 2015-06-15 (×2): qty 2

## 2015-06-15 MED ORDER — CHLORHEXIDINE GLUCONATE 0.12 % MT SOLN: 15.0000 mL | Freq: Once | OROMUCOSAL | Status: DC

## 2015-06-15 SURGICAL SUPPLY — 88 items
ADAPTER CARDIO PERF ANTE/RETRO (ADAPTER) ×4 IMPLANT
ADPR PRFSN 84XANTGRD RTRGD (ADAPTER) ×4
APL SRG 7X2 LUM MLBL SLNT (VASCULAR PRODUCTS) ×2
APPLICATOR COTTON TIP 6IN STRL (MISCELLANEOUS) ×1 IMPLANT
APPLICATOR TIP COSEAL (VASCULAR PRODUCTS) ×1 IMPLANT
ATTRACTOMAT 16X20 MAGNETIC DRP (DRAPES) ×2 IMPLANT
BAG DECANTER FOR FLEXI CONT (MISCELLANEOUS) ×2 IMPLANT
BLADE CORE FAN STRYKER (BLADE) ×3 IMPLANT
BLADE STERNUM SYSTEM 6 (BLADE) ×2 IMPLANT
BLADE SURG 15 STRL LF DISP TIS (BLADE) ×2 IMPLANT
BLADE SURG 15 STRL SS (BLADE) ×3
CANISTER SUCTION 2500CC (MISCELLANEOUS) ×3 IMPLANT
CANNULA GUNDRY RCSP 15FR (MISCELLANEOUS) ×4 IMPLANT
CATH HEART VENT LEFT (CATHETERS) IMPLANT
CATH ROBINSON RED A/P 18FR (CATHETERS) ×9 IMPLANT
CATH THORACIC 36FR (CATHETERS) ×3 IMPLANT
CATH THORACIC 36FR RT ANG (CATHETERS) ×3 IMPLANT
CAUTERY HIGH TEMP VAS (MISCELLANEOUS) ×3 IMPLANT
CONT SPEC 4OZ CLIKSEAL STRL BL (MISCELLANEOUS) ×2 IMPLANT
CONT SPEC STER OR (MISCELLANEOUS) ×3 IMPLANT
COVER SURGICAL LIGHT HANDLE (MISCELLANEOUS) ×4 IMPLANT
CRADLE DONUT ADULT HEAD (MISCELLANEOUS) ×3 IMPLANT
DRAPE SLUSH/WARMER DISC (DRAPES) ×1 IMPLANT
DRSG COVADERM 4X14 (GAUZE/BANDAGES/DRESSINGS) ×3 IMPLANT
ELECT CAUTERY BLADE 6.4 (BLADE) ×3 IMPLANT
ELECT REM PT RETURN 9FT ADLT (ELECTROSURGICAL) ×6
ELECTRODE REM PT RTRN 9FT ADLT (ELECTROSURGICAL) ×4 IMPLANT
FELT TEFLON 1X6 (MISCELLANEOUS) ×4 IMPLANT
GAUZE SPONGE 4X4 12PLY STRL (GAUZE/BANDAGES/DRESSINGS) ×3 IMPLANT
GLOVE BIO SURGEON STRL SZ 6 (GLOVE) ×2 IMPLANT
GLOVE BIO SURGEON STRL SZ 6.5 (GLOVE) ×2 IMPLANT
GLOVE BIO SURGEON STRL SZ7 (GLOVE) IMPLANT
GLOVE BIO SURGEON STRL SZ7.5 (GLOVE) IMPLANT
GLOVE EUDERMIC 7 POWDERFREE (GLOVE) ×6 IMPLANT
GOWN STRL REUS W/ TWL LRG LVL3 (GOWN DISPOSABLE) ×8 IMPLANT
GOWN STRL REUS W/ TWL XL LVL3 (GOWN DISPOSABLE) ×2 IMPLANT
GOWN STRL REUS W/TWL LRG LVL3 (GOWN DISPOSABLE) ×18
GOWN STRL REUS W/TWL XL LVL3 (GOWN DISPOSABLE) ×3
GRAFT HEMASHIELD 28X40 (Vascular Products) ×3 IMPLANT
GRAFT HEMASHIELD 28X50 (Vascular Products) IMPLANT
HEART VENT LT CURVED (MISCELLANEOUS) ×3 IMPLANT
HEMOSTAT POWDER SURGIFOAM 1G (HEMOSTASIS) ×9 IMPLANT
HEMOSTAT SURGICEL 2X14 (HEMOSTASIS) ×4 IMPLANT
INSERT FOGARTY XLG (MISCELLANEOUS) ×1 IMPLANT
KIT BASIN OR (CUSTOM PROCEDURE TRAY) ×3 IMPLANT
KIT CATH CPB BARTLE (MISCELLANEOUS) ×3 IMPLANT
KIT ROOM TURNOVER OR (KITS) ×3 IMPLANT
KIT SUCTION CATH 14FR (SUCTIONS) ×3 IMPLANT
LINE VENT (MISCELLANEOUS) ×1 IMPLANT
LOOP VESSEL SUPERMAXI WHITE (MISCELLANEOUS) ×1 IMPLANT
NDL SUT 1 .5 CRC FRENCH EYE (NEEDLE) IMPLANT
NEEDLE FRENCH EYE (NEEDLE) ×3
NS IRRIG 1000ML POUR BTL (IV SOLUTION) ×16 IMPLANT
PACK OPEN HEART (CUSTOM PROCEDURE TRAY) ×3 IMPLANT
PAD ARMBOARD 7.5X6 YLW CONV (MISCELLANEOUS) ×6 IMPLANT
SEALANT SURG COSEAL 8ML (VASCULAR PRODUCTS) ×1 IMPLANT
SET CARDIOPLEGIA MPS 5001102 (MISCELLANEOUS) ×1 IMPLANT
SET VEIN GRAFT PERF (SET/KITS/TRAYS/PACK) ×1 IMPLANT
SUT BONE WAX W31G (SUTURE) ×3 IMPLANT
SUT ETHIBON 2 0 V 52N 30 (SUTURE) ×6 IMPLANT
SUT ETHIBON EXCEL 2-0 V-5 (SUTURE) IMPLANT
SUT ETHIBOND V-5 VALVE (SUTURE) IMPLANT
SUT PROLENE 3 0 SH 1 (SUTURE) ×3 IMPLANT
SUT PROLENE 3 0 SH 48 (SUTURE) ×3 IMPLANT
SUT PROLENE 3 0 SH DA (SUTURE) IMPLANT
SUT PROLENE 4 0 RB 1 (SUTURE) ×15
SUT PROLENE 4-0 RB1 .5 CRCL 36 (SUTURE) ×8 IMPLANT
SUT PROLENE 5 0 C 1 36 (SUTURE) ×1 IMPLANT
SUT PROLENE 5 0 RB 2 (SUTURE) ×6 IMPLANT
SUT STEEL 6MS V (SUTURE) IMPLANT
SUT STEEL STERNAL CCS#1 18IN (SUTURE) IMPLANT
SUT STEEL SZ 6 DBL 3X14 BALL (SUTURE) IMPLANT
SUT VIC AB 1 CTX 36 (SUTURE) ×9
SUT VIC AB 1 CTX36XBRD ANBCTR (SUTURE) ×4 IMPLANT
SUT VIC AB 2-0 CT1 27 (SUTURE)
SUT VIC AB 2-0 CT1 TAPERPNT 27 (SUTURE) IMPLANT
SUT VIC AB 3-0 X1 27 (SUTURE) IMPLANT
SUTURE E-PAK OPEN HEART (SUTURE) ×3 IMPLANT
SYSTEM SAHARA CHEST DRAIN ATS (WOUND CARE) ×3 IMPLANT
TOWEL OR 17X24 6PK STRL BLUE (TOWEL DISPOSABLE) ×3 IMPLANT
TOWEL OR 17X26 10 PK STRL BLUE (TOWEL DISPOSABLE) ×3 IMPLANT
TRAY FOLEY IC TEMP SENS 14FR (CATHETERS) ×2 IMPLANT
TRAY FOLEY IC TEMP SENS 16FR (CATHETERS) ×3 IMPLANT
UNDERPAD 30X30 INCONTINENT (UNDERPADS AND DIAPERS) ×3 IMPLANT
VALVE AOR 12X23MECH LO POR (Prosthesis & Implant Heart) IMPLANT
VALVE AORTIC COND (Prosthesis & Implant Heart) ×3 IMPLANT
VENT LEFT HEART 12002 (CATHETERS) ×3
WATER STERILE IRR 1000ML POUR (IV SOLUTION) ×6 IMPLANT

## 2015-06-15 NOTE — Op Note (Signed)
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 Redo Median Sternotomy 2. Extracorporeal circulation 3.   Replacement of ascending aortic aneurysm using a 28 mm Hemashield graft under deep hypothermic circulatory arrest 4.   Bentall procedure using a 23 mm St. Jude Mechanical valved graft  Anesthesia:  General Endotracheal   Clinical History/Surgical Indication:    The patient is a 55 year old woman with a history of oxygen dependent COPD, degenerative spine arthritis with chronic pain on multiple pain meds, anxiety, and bicuspid aortic valve disease who underwent Ross procedure in 1998 at [facility] by Dr. [name]. She was followed in the past by Dr. [name] and then by Dr. [name] since 2015 when she was evaluated for a knee replacement. An echo at that time showed normal LV systolic function with moderate AI and a dilated aortic root. A CTA showed a dilated aortic root with a diameter of 3.8 x 4.7 cm. Her most recent echo on 04/16/2015 showed moderate to severe AI with no stenosis. Her LVEF was 55-60% with normal cavity size. Her aortic root is 4.8 cm.  She has severe aortic valve insufficiency with dilation of the aortic root after previous Ross procedure for bicuspid aortic valve disease. She has chronic shortness of breath with a history of severe COPD on home oxygen with an FEV1 of 1.04 (35% predicted) in 2014 which improved 26% with bronchodilators. I think this is largely responsible for her chronic shortness of breath. She had recent repeat PFT's which showed severe obstructive disease with an FEV1 of 0.69 (25% predicted) which improved to 0.95 ( 35% predicted) with bronchodilator. Her diffusion capacity was 80% of predicted.Her  severe AI is certainly significant and over time will continue to stress her LV and result in a dilated, hypokinetic LV. 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 Cardiac cath shows mild non-obstructive coronary disease. I agree that replacement of her aortic valve and root is the best long term treatment to prevent the above problems but she is at increased operative risk due to the redo nature of root replacement, severe obstructive airways disease, chronic pain and pain medication dependence, anxiety and depression. I discussed the operative procedure of redo root replacement with her and her family, the alternative of continue medical therapy and the eventual deterioration of cardiac function that may preclude surgical treatment. I discussed the options of mechanical and tissue valved grafts for replacement and the pros and cons of both. She is only 14 so a mechanical valve would be the best option for longevity. 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 She understands and is in agreement with a mechanical valved graft. I discussed the operative procedure with the patient and family including alternatives, benefits and risks; including but not limited to bleeding, blood transfusion, infection, stroke, myocardial infarction, heart block requiring a permanent pacemaker, organ dysfunction, and death. Lindsay Bonilla understands and agrees to proceed.           Preparation:  The patient was seen in the preoperative holding area and the correct patient, correct operation were confirmed with the patient after reviewing the medical record  and catheterization. The consent was signed by me. Preoperative antibiotics were given. A pulmonary arterial line and radial arterial line were placed by the anesthesia team. The patient was taken back to the operating room and positioned supine on the operating  room table. After being placed under general endotracheal anesthesia by the anesthesia team a foley catheter was placed. The neck, chest, abdomen, and both legs were prepped with betadine soap and solution and draped in the usual sterile manner. A surgical time-out was taken and the correct patient and operative procedure were confirmed with the nursing and anesthesia staff.   TEE: performed by Dr. Nolon Nations   Cardiopulmonary Bypass:  A redo median sternotomy was performed. The sternal wires were removed and the sternum was opened using the oscillating saw without difficulty. Bone hooks were used to retract the sternum and the heart was dissected from the back of the sternum to allow placement of the [place] retractor. The ascending aorta was aneurysmal and the largest part appeared to be the wall of the transplanted pulmonary autograft. There were no contraindications to aortic cannulation. The patient was fully systemically heparinized and the ACT was maintained > 400 sec. The distal ascending aorta  was cannulated with a 20 F aortic cannula for arterial inflow. Venous cannulation was performed via the right atrial appendage using a two-staged venous cannula.  A temperature probe was inserted into the interventricular septum and an insulating pad was placed in the pericardium. CO2 was insufflated into the pericardium throughout the case to minimize intracardiac air.   Resection and grafting of ascending aortic aneurysm:  The patient was placed on cardiopulmonary bypass and a left ventricular vent was placed via the right superior pulmonary vein. Systemic cooling was begun with a goal temperature of 18 degrees centigrade by bladder and rectal temperature probes. A retrograde cardioplegia cannula was placed through the right atrium into the coronary sinus without difficulty. A retrograde cerebral perfusion cannula was placed into the SVC through a pursestring suture and the SVC was encircled with a  silastic tape. After 30 minutes of cooling the target temperature of 18 degrees centigrade was reached. Cerebral oximetry was 70% bilaterally. BIS was zero. The patient was given 100 mg of Propofol, 2 mg Versed and 125 mg of Solumedrol. The head was packed in ice. The bed was placed in steep trendelenburg. Circulatory arrest was begun and the blood volume emptied into the venous reservoir. Continuous retrograde cerebral perfusion was begun and the SVC occluded with the silastic tape. Cold blood retrograde cardioplegia was given and myocardial temperature dropped to 10 degrees centigrade. Additional doses were given at approximately 20 minute intervals throughout the period of circulatory arrest and cross-clamping. Once the coronary buttons were removed I gave antegrade cold blood cardioplegia directly into the right coronary ostium to get maximum protection for the RV. Complete diastolic arrest was maintained. The aortic cannula was removed. The aorta was transected just proximal to the innominate artery beveling the resection out along the undersurface of the aortic arch (Hemiarch replacement). The aortic diameter was measured at 28 mm here. A 28 x 10 mm Hemasheild Platinum vascular graft was prepared. ( Catalog # [id number], Lot # [id number], SN [id number]). It was anastomosed to the aortic arch in an end to end manner using 3-0 prolene continuous suture with a felt strip to reinforce the anastomisis. A light coating of CoSeal was applied to seal needle holes. The arterial end of the bypass circuit was then connected to the 69mm side arm  graft and circulation was slowly resumed. The tape was removed from the SVC. The aortic graft was cross-clamped proximal to the side arm graft and full CPB support was resumed. Circulatory arrest time was 24 minutes. Retrograde cerebral perfusion time was 18 minutes.   Bentall Procedure:   The ascending aorta was mobilized from the right pulmonary artery and main PA  homograft. There was dense adhesion between the PA homograft and the pulmonary autograft and this took a while to mobilize but was done without injury. It was opened longitudinally and the valve inspected. The sinuses were dilated as was the annulus. The leaflets were thickened and retracted with no central coaptation. The right and left coronary arteries were removed from the aortic root with a button of aortic wall around the ostia. The left coronary was immediately adjacent to the annulus but I was able to mobilize this enough to anastomose it to the new graft. They were retracted carefully out of the way with stay sutures to prevent rotation. The native valve was excised. The annulus was sized and a 23 mm St. Jude Mechanical Valved Graft was chosen. ( Ref # [id number], Serial # [id number]) A series of pledgetted 2-0 Ethibond horizontal mattress sutures were placed around the annulus with the pledgets in a sub-annular position. The sutures were placed through a strip of autologous pericardium to reinforce the annulus and then the valve sewing ring. The valve was lowered into place and the sutures at the hinge posts tied first followed by the remaining sutures. The valve seated nicely. The discs moved normally. Small openings were made in the graft for the coronary anastomoses using a thermal cautery. Then the left and right coronary buttons were anastomosed to the graft in an end to side manner using continuous 5-0 prolene suture. A light coating of CoSeal was applied to each anastomosis for hemostasis. The two grafts were then cut to the appropriate length and anastomosed end to end using continuous 3-0 prolene suture. CoSeal was applied to seal the needle holes in the grafts. A vent cannula was placed into the graft to remove any air. Deairing maneuvers were performed and the bed placed in trendelenburg position.   Completion:  The patient was rewarmed to 37 degrees Centigrade. The crossclamp was removed  with a time of 145 minutes. There was spontaneous return of sinus rhythm. The position of the grafts was satisfactory. The vascular anastomoses all appeared hemostatic. Two temporary epicardial pacing wires were placed on the right atrium and two on the right ventricle. The patient was weaned from CPB without difficulty on no inotropes. CPB time was 227 minutes. Cardiac output was 6 LPM. TEE showed normal mechanical valve function with no AI, normal LV function, trivial MR. Heparin was fully reversed with protamine and the aortic and venous cannulas removed. Hemostasis was achieved. Mediastinal and left pleural drainage tubes were placed. The sternum was closed with double #6 stainless steel wires. The fascia was closed with continuous # 1 vicryl suture. The subcutaneous tissue was closed with 2-0 vicryl continuous suture. The skin was closed with 3-0 vicryl subcuticular suture. All sponge, needle, and instrument counts were reported correct at the end of the case. Dry sterile dressings were placed over the incisions and around the chest tubes which were connected to pleurevac suction. The patient was then transported to the surgical intensive care unit in critical but stable condition.

## 2015-06-15 NOTE — Anesthesia Procedure Notes (Signed)
Procedure Name: Intubation Date/Time: 06/15/2015 7:47 AM Performed by: Rebekah Chesterfield L Pre-anesthesia Checklist: Patient identified, Emergency Drugs available, Suction available and Patient being monitored Patient Re-evaluated:Patient Re-evaluated prior to inductionOxygen Delivery Method: Circle System Utilized Preoxygenation: Pre-oxygenation with 100% oxygen Intubation Type: IV induction Ventilation: Mask ventilation without difficulty Laryngoscope Size: Mac and 3 Grade View: Grade I Tube type: Subglottic suction tube Tube size: 7.5 mm Number of attempts: 1 Airway Equipment and Method: Stylet Placement Confirmation: ETT inserted through vocal cords under direct vision,  positive ETCO2 and breath sounds checked- equal and bilateral Secured at: 21 cm Tube secured with: Tape Dental Injury: Teeth and Oropharynx as per pre-operative assessment

## 2015-06-15 NOTE — Interval H&P Note (Signed)
History and Physical Interval Note:  06/15/2015 6:20 AM  Lindsay Bonilla  has presented today for surgery, with the diagnosis of TAA AI  The various methods of treatment have been discussed with the patient and family. After consideration of risks, benefits and other options for treatment, the patient has consented to  Procedure(s): REDO STERNOTOMY (N/A) BENTALL PROCEDURE (N/A) TRANSESOPHAGEAL ECHOCARDIOGRAM (TEE) (N/A) as a surgical intervention .  The patient's history has been reviewed, patient examined, no change in status, stable for surgery.  I have reviewed the patient's chart and labs.  Questions were answered to the patient's satisfaction.     Gaye Pollack

## 2015-06-15 NOTE — H&P (Signed)
[address]       [name] [address]             [phone]       Cardiothoracic Surgery History and Physical   PCP is [name], MD Referring Provider is [name]*  Chief Complaint  Patient presents with  . Thoracic Aortic Aneurysm    Surgical eval, ECHO 04/16/15, HX of bicuspid aortic valve s/p Ross procedure in 1998  . Aortic Insuffiency    HPI:  The patient is a 55 year old woman with a history of oxygen dependent COPD, degenerative spine arthritis with chronic pain on multiple pain meds, anxiety, and bicuspid aortic valve disease who underwent Ross procedure in 1998 at [facility] by Dr. [name]. She was followed in the past by Dr. [name] and then by Dr. [name] since 2015 when she was evaluated for a knee replacement. An echo at that time showed normal LV systolic function with moderate AI and a dilated aortic root. A CTA showed a dilated aortic root with a diameter of 3.8 x 4.7 cm. Her most recent echo on 04/16/2015 showed moderate to severe AI with no stenosis. Her LVEF was 55-60% with normal cavity size. Her aortic root is 4.8 cm.  She wears oxygen during the day. She no longer smokes. She reports daily shortness of breath with exertion which has been chronic. She has had rare episodes of chest pressure associated with the shortness of breath. She currently has a URI which has been going on for a few weeks.    Past Medical History  Diagnosis Date  . Palpitations   . 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 Aortic stenosis, severe     Bicuspid valve  . Valvular heart disease   . Migraine headache   . Bronchitis   . COPD (chronic obstructive pulmonary disease) ([place])   . CHF (congestive heart failure) ([place])   . Asthma   . Aortic aneurysm ([place])   . Dysrhythmia     palpitations  . Anxiety   . GERD (gastroesophageal reflux disease)   .  Arthritis   . Shortness of breath     Nodule left lung CT done 8/6    Past Surgical History  Procedure Laterality Date  . Sigmoidoscopy  10/13/05, 09/19/05  . Colonoscopy  10/13/05  . Aortic valve replacement    . Cholecystectomy    . Abdominal hysterectomy    . Knee arthroscopy      rt x4 x1 lft  . Tonsillectomy    . Tubal ligation    . Arthroscopic repair acl  rt  . Diagnostic laparoscopy    . Tympanostomy tube placement    . Myringoplasty w/ fat graft      graft from behind ear  . Anterior cervical decomp/discectomy fusion  10/05/2011    Procedure: ANTERIOR CERVICAL DECOMPRESSION/DISCECTOMY FUSION 2 LEVELS; Surgeon: Floyce Stakes, MD; Location: [facility]; Service: Neurosurgery; Laterality: N/A; Cervical five - six , six - seven Anterior cervical decompression/diskectomy/fusion/plate    Family History  Problem Relation Age of Onset  . Cancer Mother     Type unknown  . Heart attack Father   . Diabetes      family history  . Asthma Brother   . Hyperlipidemia      family history    Social History Social History  Substance Use Topics  . Smoking status: Former Smoker -- 0.10  packs/day for 30 years    Types: Cigarettes  . Smokeless tobacco: None  . Alcohol Use: No    Current Outpatient Prescriptions  Medication Sig Dispense Refill  . albuterol (PROVENTIL) (2.5 MG/3ML) 0.083% nebulizer solution Take 2.5 mg by nebulization every 6 (six) hours as needed for wheezing or shortness of breath.    Marland Kitchen albuterol (VENTOLIN HFA) 108 (90 BASE) MCG/ACT inhaler Inhale 1 puff into the lungs 4 (four) times daily as needed. For shortness of breath    . ALPRAZolam (XANAX) 1 MG tablet Take 1 mg by mouth 4 (four) times daily as needed. For anxiety    . budesonide-formoterol (SYMBICORT) 160-4.5 MCG/ACT inhaler Inhale 2 puffs into the lungs  2 (two) times daily.    Marland Kitchen buPROPion (WELLBUTRIN SR) 150 MG 12 hr tablet Take 150 mg by mouth 2 (two) times daily.    . cetirizine (ZYRTEC) 10 MG tablet Take 10 mg by mouth every morning.     . cyclobenzaprine (FLEXERIL) 10 MG tablet Take 10 mg by mouth 3 (three) times daily as needed for muscle spasms.    Marland Kitchen docusate sodium (COLACE) 100 MG capsule Take 100 mg by mouth 2 (two) times daily as needed for mild constipation.    Marland Kitchen EPINEPHrine (EPIPEN) 0.3 mg/0.3 mL DEVI Inject 0.3 mg into the muscle as needed. For allergic reaction    . furosemide (LASIX) 40 MG tablet TAKE (1) TABLET TWICE DAILY. 60 tablet 10  . meloxicam (MOBIC) 15 MG tablet Take 15 mg by mouth daily.    . montelukast (SINGULAIR) 10 MG tablet Take 10 mg by mouth at bedtime.     . Olopatadine HCl (PATADAY) 0.2 % SOLN Apply 1 drop to eye as needed.    Marland Kitchen omeprazole (PRILOSEC) 20 MG capsule Take 20 mg by mouth 2 (two) times daily before a meal.    . OVER THE COUNTER MEDICATION Place 2 sprays into both nostrils daily. Clarispray    . oxyCODONE-acetaminophen (PERCOCET) 10-325 MG per tablet Take 1 tablet by mouth 5 (five) times daily as needed for pain. As needed for pain    . oxymorphone (OPANA) 10 MG tablet Take 10 mg by mouth every 8 (eight) hours as needed for pain.    . potassium chloride (K-DUR) 10 MEQ tablet TAKE 2 TABLETS BY MOUTH TWICE DAILY. 120 tablet 7  . PRESCRIPTION MEDICATION Inject as directed every 30 (thirty) days. Allergy Injections    . simvastatin (ZOCOR) 40 MG tablet TAKE 1 TABLET BY MOUTH AT BEDTIME. 30 tablet 9  . SUMAtriptan (IMITREX) 100 MG tablet Take 100 mg by mouth as needed. For migraines    . Tiotropium Bromide Monohydrate (SPIRIVA RESPIMAT) 1.25 MCG/ACT AERS Inhale 2 sprays into the lungs daily.      No current facility-administered medications for this visit.    Allergies  Allergen Reactions  . Beta  Adrenergic Blockers Anaphylaxis  . Peanut-Containing Drug Products Anaphylaxis, Shortness Of Breath and Swelling    Swelling of the throat  . Shrimp [Shellfish Allergy] Anaphylaxis  . Avelox [Moxifloxacin Hcl In Nacl] Rash  . Ciprofloxacin Nausea And Vomiting  . Cleocin [Clindamycin Hcl] Rash  . Moxifloxacin Rash  . Sulfamethoxazole-Trimethoprim Rash  . Tape     White clear itches, rash    Review of Systems  Constitutional: Positive for fatigue. Negative for fever, chills, activity change, appetite change and unexpected weight change.  HENT: Negative for dental problem.   Last saw her dentist 10/2014  Eyes: Negative.  Respiratory: Positive  for cough, chest tightness, shortness of breath and wheezing.  Cardiovascular: Positive for chest pain, palpitations and leg swelling.  Gastrointestinal: Positive for abdominal pain and constipation.   Reflux and hiatal hernia  Endocrine: Negative.  Genitourinary:   H/o UTI  Musculoskeletal: Positive for arthralgias.  Skin: Negative.  Allergic/Immunologic: Negative.  Neurological: Positive for numbness and headaches.   Chronic pain, memory problems  Hematological: Bruises/bleeds easily.  Psychiatric/Behavioral: Positive for dysphoric mood. The patient is nervous/anxious.    BP 134/85 mmHg  Pulse 92  Resp 20  Ht 5\' 4"  (1.626 m)  Wt 170 lb (77.111 kg)  BMI 29.17 kg/m2  SpO2 97% Physical Exam  Constitutional: She is oriented to person, place, and time. She appears well-developed and well-nourished. No distress.  HENT:  Head: Atraumatic.  Mouth/Throat: Oropharynx is clear and moist.  Eyes: EOM are normal. Pupils are equal, round, and reactive to light.  Neck: Normal range of motion. Neck supple. No JVD present. No thyromegaly present.  Cardiovascular: Normal rate, regular rhythm and intact distal pulses.  Murmur heard. 3/6 diastolic murmur at apex.  Pulmonary/Chest: Effort  normal. No respiratory distress. She has no wheezes. She has no rales.  Decreased breath sounds throughout.  Abdominal: Soft. Bowel sounds are normal. She exhibits no distension and no mass. There is no tenderness.  Musculoskeletal: Normal range of motion. She exhibits no edema.  Lymphadenopathy:   She has no cervical adenopathy.  Neurological: She is alert and oriented to person, place, and time. She has normal strength. No cranial nerve deficit or sensory deficit.  Skin: Skin is warm and dry.  Psychiatric:  anxious     Diagnostic Tests:              [facility]            [address], [place] [address]              [phone]  ------------------------------------------------------------------- Transthoracic Echocardiography  Patient:  Lindsay Bonilla, Lindsay Bonilla MR #:    [id number] Study Date: 04/16/2015 Gender:   F Age:    64 Height:   162.6 cm Weight:   77.1 kg BSA:    1.89 m^2 Pt. Status: Room:  ATTENDING  Sonny Dandy, [place] SONOGRAPHER Marygrace Drought, [facility] PERFORMING  [facility], Outpatient  cc:  ------------------------------------------------------------------- LV EF: 55% -  60%  ------------------------------------------------------------------- Indications:   Aortic Valve Disorder (I35.9).  ------------------------------------------------------------------- History:  PMH: History of Ross procedure (1998, Pulmonic Valve Homograft), Thoracic Aortic Aneurysm.  ------------------------------------------------------------------- Study Conclusions  - Left ventricle: The cavity size was normal. There was mild focal basal hypertrophy of the septum. Systolic function was normal. The estimated ejection fraction was in the range of 55% to 60%. Wall motion was normal; there were no  regional wall motion abnormalities. Doppler parameters are consistent with abnormal left ventricular relaxation (grade 1 diastolic dysfunction). The E/e&' ratio is <8, suggesting normal LV filling pressure. - Aortic valve: Reported pulmonic valve homograft. Trileaflet; mildly calcified leaflets. There was no stenosis. There was moderate to severe regurgitation. Valve area (VTI): 2.67 cm^2. Valve area (Vmax): 2.71 cm^2. Valve area (Vmean): 2.6 cm^2. - Aortic root: The aortic root is dilated to 4.8 cm. - Ascending aorta: The ascending aorta is dilated to 4.5 cm. - Mitral valve: Mildly thickened leaflets . There was mild regurgitation. - Atrial septum: No defect or patent foramen ovale  was identified. - Tricuspid valve: There was trivial regurgitation. - Pulmonic valve: Poorly visualized. There was mild regurgitation.  Impressions:  - Compared to the prior study in 09/2014, there is more likely severe AI with a further dilated aortic root to 4.8 cm and ascending aorta to 4.5 cm. LVEF is higher at 55-60%.  Transthoracic echocardiography. M-mode, complete 2D, spectral Doppler, and color Doppler. Birthdate: Patient birthdate: [birth year]. Age: Patient is 55 yr old. Sex: Gender: female. BMI: 29.2 kg/m^2. Blood pressure:   122/84 Patient status: Outpatient. Study date: Study date: 04/16/2015. Study time: 10:52 AM. Location: [facility]  -------------------------------------------------------------------  ------------------------------------------------------------------- Left ventricle: The cavity size was normal. There was mild focal basal hypertrophy of the septum. Systolic function was normal. The estimated ejection fraction was in the range of 55% to 60%. Wall motion was normal; there were no regional wall motion abnormalities. Doppler parameters are consistent with abnormal left ventricular relaxation (grade 1 diastolic dysfunction). The  E/e&' ratio is <8, suggesting normal LV filling pressure.  ------------------------------------------------------------------- Aortic valve: Reported pulmonic valve homograft. Trileaflet; mildly calcified leaflets. Doppler:  There was no stenosis. There was moderate to severe regurgitation.  VTI ratio of LVOT to aortic valve: 0.64. Valve area (VTI): 2.67 cm^2. Indexed valve area (VTI): 1.41 cm^2/m^2. Peak velocity ratio of LVOT to aortic valve: 0.65. Valve area (Vmax): 2.71 cm^2. Indexed valve area (Vmax): 1.43 cm^2/m^2. Mean velocity ratio of LVOT to aortic valve: 0.63. Valve area (Vmean): 2.6 cm^2. Indexed valve area (Vmean): 1.38 cm^2/m^2. Mean gradient (S): 7 mm Hg. Peak gradient (S): 12 mm Hg.  ------------------------------------------------------------------- Aorta: Aortic root: The aortic root is dilated to 4.8 cm. Ascending aorta: The ascending aorta is dilated to 4.5 cm.  ------------------------------------------------------------------- Mitral valve:  Mildly thickened leaflets . Doppler: There was mild regurgitation.  ------------------------------------------------------------------- Left atrium: The atrium was normal in size.  ------------------------------------------------------------------- Atrial septum: No defect or patent foramen ovale was identified.  ------------------------------------------------------------------- Pulmonic valve:  Poorly visualized. Doppler: There was mild regurgitation.  ------------------------------------------------------------------- Tricuspid valve:  Doppler: There was trivial regurgitation.  ------------------------------------------------------------------- Pulmonary artery:  The main pulmonary artery was normal-sized.  ------------------------------------------------------------------- Right atrium: The atrium was normal in  size.  ------------------------------------------------------------------- Pericardium: There was no pericardial effusion.  ------------------------------------------------------------------- Systemic veins: Inferior vena cava: The vessel was normal in size. The respirophasic diameter changes were in the normal range (= 50%), consistent with normal central venous pressure. Diameter: 11 mm.  ------------------------------------------------------------------- Measurements  IVC                    Value     Reference ID                    11  mm    ---------  Left ventricle              Value     Reference LV ID, ED, PLAX chordal          50.3 mm    43 - 52 LV ID, ES, PLAX chordal          36.3 mm    23 - 38 LV fx shortening, PLAX chordal  (L)   28  %    >=29 LV PW thickness, ED            9.55 mm    --------- IVS/LV PW ratio, ED            1.29      <=1.3 Stroke volume, 2D  92  ml    --------- Stroke volume/bsa, 2D           49  ml/m^2  --------- LV ejection fraction, 1-p A4C       47  %    --------- LV e&', lateral              14  cm/s   --------- LV E/e&', lateral             4.62      --------- LV e&', medial               11.6 cm/s   --------- LV E/e&', medial              5.58      --------- LV e&', average              12.8 cm/s   --------- LV E/e&', average             5.05      ---------  Ventricular septum            Value     Reference IVS thickness, ED             12.3 mm    ---------  LVOT                   Value     Reference LVOT ID, S                23  mm    --------- LVOT  area                 4.15 cm^2   --------- LVOT peak velocity, S           111  cm/s   --------- LVOT mean velocity, S           79  cm/s   --------- LVOT VTI, S                22.2 cm    --------- LVOT peak gradient, S           5   mm Hg  ---------  Aortic valve               Value     Reference Aortic valve peak velocity, S       170  cm/s   --------- Aortic valve mean velocity, S       126  cm/s   --------- Aortic valve VTI, S            34.5 cm    --------- Aortic mean gradient, S          7   mm Hg  --------- Aortic peak gradient, S          12  mm Hg  --------- VTI ratio, LVOT/AV            0.64      --------- Aortic valve area, VTI          2.67 cm^2   --------- Aortic valve area/bsa, VTI        1.41 cm^2/m^2 --------- Velocity ratio, peak, LVOT/AV       0.65      --------- Aortic valve area, peak velocity     2.71 cm^2   --------- Aortic valve area/bsa, peak        1.43 cm^2/m^2 --------- velocity Velocity ratio, mean, LVOT/AV  0.63      --------- Aortic valve area, mean velocity     2.6  cm^2   --------- Aortic valve area/bsa, mean        1.38 cm^2/m^2 --------- velocity Aortic regurg pressure half-time     465  ms    ---------  Left atrium                Value     Reference LA ID, A-P, ES              40  mm    --------- LA ID/bsa, A-P              2.12 cm/m^2  <=2.2 LA volume, S               39  ml    --------- LA volume/bsa, S             20.6 ml/m^2  --------- LA volume, ES, 1-p A4C          36  ml    --------- LA volume/bsa, ES, 1-p A4C        19.1 ml/m^2   --------- LA volume, ES, 1-p A2C          39  ml    --------- LA volume/bsa, ES, 1-p A2C        20.6 ml/m^2  ---------  Mitral valve               Value     Reference Mitral E-wave peak velocity        64.7 cm/s   --------- Mitral A-wave peak velocity        83.4 cm/s   --------- Mitral deceleration time         155  ms    150 - 230 Mitral E/A ratio, peak          0.8      ---------  Pulmonary arteries            Value     Reference PA pressure, S, DP            22  mm Hg  <=30  Tricuspid valve              Value     Reference Tricuspid regurg peak velocity      216  cm/s   --------- Tricuspid peak RV-RA gradient       19  mm Hg  --------- Tricuspid maximal regurg         216  cm/s   --------- velocity, PISA  Systemic veins              Value     Reference Estimated CVP               3   mm Hg  ---------  Right ventricle              Value     Reference RV pressure, S, DP            22  mm Hg  <=30 RV s&', lateral, S             11  cm/s   ---------  Legend: (L) and (H) mark values outside specified reference range.  ------------------------------------------------------------------- Prepared and Electronically Authenticated by  Lyman Bishop MD 2017-02-16T17:01:22     CLINICAL DATA: Followup of thoracic aortic aneurysm.  EXAM: CT ANGIOGRAPHY CHEST WITH CONTRAST  TECHNIQUE: Multidetector  CT imaging of the chest was performed using the standard protocol during bolus administration of intravenous contrast. Multiplanar CT image reconstructions and MIPs were obtained to evaluate the vascular anatomy.  CONTRAST: 158mL OMNIPAQUE IOHEXOL 350 MG/ML SOLN  COMPARISON: 10/23/2013.  Plain films of 08/31/2014.  FINDINGS: Mediastinum/Nodes: No supraclavicular adenopathy. Status post Ross procedure. Normal caliber great vessels. tortuosity of the thoracic aorta. Transverse measurement of the ascending aorta on the prior exam is artificially enlarged secondary to the tortuosity. Today, this measures 4.6 x 4.0 on image 40 of series 4. 4.7 x 3.8 cm on the prior exam. Based on coronal reformatted images, the aorta measures 3.1 cm at the level of the aortic valve, 3.8 cm just above the aortic valve, and 3.7 cm within the more distal ascending aorta. Example coronal image 37. When remeasured at the same level on the prior exam, the measurements are 2.9 cm, 3.4 cm, and 3.8 cm respectively. This suggests relative stability. Normal caliber of the transverse and descending segments.  Mild cardiomegaly with prior median sternotomy. No central pulmonary embolism, on this non-dedicated study. No mediastinal hematoma to suggest hemorrhage. No mediastinal or hilar adenopathy.  Lungs/Pleura: No pleural fluid. Minimal motion degradation. Lower lobe predominant bronchial wall thickening. Advanced centrilobular emphysema.  3 mm subpleural left lower lobe pulmonary nodule on image 55 of series 5 is unchanged and likely a subpleural lymph node. No lobar consolidation.  Upper abdomen: Normal imaged portions of the liver, spleen, stomach, pancreas, adrenal glands.  Musculoskeletal: Lower cervical spine fixation.  Review of the MIP images confirms the above findings.  IMPRESSION: 1. Similar appearance of ascending aortic dilatation and tortuosity. 2. Advanced centrilobular emphysema.   Electronically Signed  By: Abigail Miyamoto M.D.  On: 10/29/2014 15:57   Impression:  She has severe aortic valve insufficiency with dilation of the aortic root after previous Ross procedure for bicuspid aortic valve disease. She has chronic shortness of breath with a history of severe  COPD on home oxygen with an FEV1 of 1.04 (35% predicted) in 2014 which improved 26% with bronchodilators. I think this is largely responsible for her chronic shortness of breath. She had recent repeat PFT's which showed severe obstructive disease with an FEV1 of 0.69 (25% predicted) which improved to 0.95 ( 35% predicted) with bronchodilator. Her diffusion capacity was 80% of predicted.Her severe AI is certainly significant and over time will continue to stress her LV and result in a dilated, hypokinetic LV. 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 Cardiac cath shows mild non-obstructive coronary disease. I agree that replacement of her aortic valve and root is the best long term treatment to prevent the above problems but she is at increased operative risk due to the redo nature of root replacement, severe obstructive airways disease, chronic pain and pain medication dependence, anxiety and depression. I discussed the operative procedure of redo root replacement with her and her family, the alternative of continue medical therapy and the eventual deterioration of cardiac function that may preclude surgical treatment. I discussed the options of mechanical and tissue valved grafts for replacement and the pros and cons of both. She is only 61 so a mechanical valve would be the best option for longevity. 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 She understands and is in agreement with a mechanical valved graft. I discussed the operative procedure with the patient and family including alternatives,  benefits and risks; including but not limited to bleeding, blood transfusion, infection, stroke, myocardial infarction, heart block requiring a permanent pacemaker, organ dysfunction, and death. Vivi Ferns understands and agrees to proceed.

## 2015-06-15 NOTE — Anesthesia Postprocedure Evaluation (Signed)
Anesthesia Post Note  Patient: Lindsay Bonilla  Procedure(s) Performed: Procedure(s) (LRB): REDO STERNOTOMY (N/A) BENTALL PROCEDURE; HEMI-ARCH REPAIR WITH #23 ST JUDE MECHANICAL AVR CONDUIT AND #28 HEMASHIELD PLATINUM GRAFT (N/A) TRANSESOPHAGEAL ECHOCARDIOGRAM (TEE) (N/A)  Patient location during evaluation: SICU Anesthesia Type: General Level of consciousness: patient remains intubated per anesthesia plan Pain management: pain level controlled Vital Signs Assessment: post-procedure vital signs reviewed and stable Respiratory status: patient remains intubated per anesthesia plan and patient on ventilator - see flowsheet for VS Cardiovascular status: stable Anesthetic complications: no    Last Vitals:  Filed Vitals:   06/15/15 1530 06/15/15 1600  BP: 101/81 96/77  Pulse: 80 80  Temp: 35.1 C 35.7 C  Resp: 24 12    Last Pain: There were no vitals filed for this visit.               Nolon Nations

## 2015-06-15 NOTE — Transfer of Care (Signed)
Immediate Anesthesia Transfer of Care Note  Patient: Lindsay Bonilla  Procedure(s) Performed: Procedure(s): REDO STERNOTOMY (N/A) BENTALL PROCEDURE; HEMI-ARCH REPAIR WITH #23 ST JUDE MECHANICAL AVR CONDUIT AND #28 HEMASHIELD PLATINUM GRAFT (N/A) TRANSESOPHAGEAL ECHOCARDIOGRAM (TEE) (N/A)  Patient Location: SICU  Anesthesia Type:General  Level of Consciousness: sedated and Patient remains intubated per anesthesia plan  Airway & Oxygen Therapy: Patient remains intubated per anesthesia plan and Patient placed on Ventilator (see vital sign flow sheet for setting)  Post-op Assessment: Report given to RN, Post -op Vital signs reviewed and stable and Patient moving all extremities  Post vital signs: Reviewed and stable  Last Vitals:  Filed Vitals:   06/15/15 0626  BP: 122/61  Pulse: 81  Temp: 36.8 C  Resp: 18    Complications: No apparent anesthesia complications

## 2015-06-15 NOTE — OR Nursing (Signed)
Forty-five minute call to SICU at 1319. Spoke with Tamela Oddi.

## 2015-06-15 NOTE — Brief Op Note (Addendum)
06/15/2015  12:55 PM      [address]       [place] [address]             [phone]     06/15/2015  12:55 PM  PATIENT:  Lindsay Bonilla  55 y.o. female  PRE-OPERATIVE DIAGNOSIS:  TAA AI  POST-OPERATIVE DIAGNOSIS:  TAA AI  PROCEDURE:  Procedure(s): REDO STERNOTOMY BENTALL PROCEDURE; HEMI-ARCH REPAIR WITH #23 ST JUDE MECHANICAL AVR CONDUIT AND #28 HEMASHIELD PLATINUM GRAFT TRANSESOPHAGEAL ECHOCARDIOGRAM (TEE)  SURGEON:  Surgeon(s): Gaye Pollack, MD  PHYSICIAN ASSISTANT: Santi Troung PA-C  ANESTHESIA:   general  PATIENT CONDITION:  ICU - intubated and hemodynamically stable.  PRE-OPERATIVE WEIGHT: [id number]  COMPLICATIONS: NO KNOWN   Aortic Valve  Procedure Performed: Replacement: Yes.  Mechanical Valve. Implant Model Number:[id number], Size:23, Unique Device Identifier:[id number]  Repair/Reconstruction: No.    Aortic Annular Enlargement: Yes.    Aortic Valve Etiology   Aortic Insufficiency:  Severe  Aortic Valve Disease:  No.  Aortic Stenosis:  No.  Etiology (Choose at least one and up to  5 etiologies):  Primary Aortic Disease, Idiopathic Root Dilation and Other PREVIOUS ROSS PROCEDURE

## 2015-06-15 NOTE — Progress Notes (Signed)
Echocardiogram Echocardiogram Transesophageal has been performed.  Lindsay Bonilla 06/15/2015, 8:58 AM

## 2015-06-15 NOTE — Progress Notes (Signed)
CT surgery p.m. Rounds  Status post redo biologic-Bentall procedure Patient is extubated Stable hemodynamics, AV pacing Neuro intact Breathing comfortably with saturation 95%

## 2015-06-15 NOTE — Procedures (Signed)
Extubation Procedure Note  Patient Details:   Name: Lindsay Bonilla DOB: [birth year] MRN: [id number]   Airway Documentation:     Evaluation  O2 sats: stable throughout Complications: No apparent complications Patient did tolerate procedure well. Bilateral Breath Sounds: Clear   Yes  Cordella Register 06/15/2015, 6:43 PM

## 2015-06-15 NOTE — Progress Notes (Signed)
NiF -60, VC 1.0L

## 2015-06-15 NOTE — OR Nursing (Signed)
Twenty minute call to SICU charge nurse at 1358.

## 2015-06-16 ENCOUNTER — Encounter (HOSPITAL_COMMUNITY): Payer: Self-pay | Admitting: Surgery

## 2015-06-16 ENCOUNTER — Inpatient Hospital Stay (HOSPITAL_COMMUNITY): Payer: Medicaid Other

## 2015-06-16 LAB — GLUCOSE, CAPILLARY
GLUCOSE-CAPILLARY: 160 mg/dL — AB (ref 65–99)
GLUCOSE-CAPILLARY: 91 mg/dL (ref 65–99)
GLUCOSE-CAPILLARY: 115 mg/dL — AB (ref 65–99)
Glucose-Capillary: 101 mg/dL — ABNORMAL HIGH (ref 65–99)
Glucose-Capillary: 103 mg/dL — ABNORMAL HIGH (ref 65–99)
Glucose-Capillary: 108 mg/dL — ABNORMAL HIGH (ref 65–99)
Glucose-Capillary: 108 mg/dL — ABNORMAL HIGH (ref 65–99)
Glucose-Capillary: 121 mg/dL — ABNORMAL HIGH (ref 65–99)
Glucose-Capillary: 134 mg/dL — ABNORMAL HIGH (ref 65–99)
Glucose-Capillary: 143 mg/dL — ABNORMAL HIGH (ref 65–99)
Glucose-Capillary: 172 mg/dL — ABNORMAL HIGH (ref 65–99)
Glucose-Capillary: 72 mg/dL (ref 65–99)

## 2015-06-16 LAB — POCT I-STAT, CHEM 8
BUN: 12 mg/dL (ref 6–20)
CREATININE: 0.6 mg/dL (ref 0.44–1.00)
Calcium, Ion: 1.09 mmol/L — ABNORMAL LOW (ref 1.12–1.23)
Chloride: 95 mmol/L — ABNORMAL LOW (ref 101–111)
Glucose, Bld: 133 mg/dL — ABNORMAL HIGH (ref 65–99)
HEMATOCRIT: 34 % — AB (ref 36.0–46.0)
HEMOGLOBIN: 11.6 g/dL — AB (ref 12.0–15.0)
POTASSIUM: 3.7 mmol/L (ref 3.5–5.1)
Sodium: 134 mmol/L — ABNORMAL LOW (ref 135–145)
TCO2: 27 mmol/L (ref 0–100)

## 2015-06-16 LAB — CBC
HCT: 31.4 % — ABNORMAL LOW (ref 36.0–46.0)
HEMATOCRIT: 31.3 % — AB (ref 36.0–46.0)
HEMOGLOBIN: 10.7 g/dL — AB (ref 12.0–15.0)
Hemoglobin: 10.2 g/dL — ABNORMAL LOW (ref 12.0–15.0)
MCH: 28.6 pg (ref 26.0–34.0)
MCH: 30 pg (ref 26.0–34.0)
MCHC: 32.6 g/dL (ref 30.0–36.0)
MCHC: 34.1 g/dL (ref 30.0–36.0)
MCV: 87.7 fL (ref 78.0–100.0)
MCV: 88 fL (ref 78.0–100.0)
PLATELETS: 152 10*3/uL (ref 150–400)
Platelets: 141 10*3/uL — ABNORMAL LOW (ref 150–400)
RBC: 3.57 MIL/uL — AB (ref 3.87–5.11)
RBC: 3.57 MIL/uL — ABNORMAL LOW (ref 3.87–5.11)
RDW: 13.2 % (ref 11.5–15.5)
RDW: 13.3 % (ref 11.5–15.5)
WBC: 13.9 10*3/uL — ABNORMAL HIGH (ref 4.0–10.5)
WBC: 12.7 10*3/uL — ABNORMAL HIGH (ref 4.0–10.5)

## 2015-06-16 LAB — BASIC METABOLIC PANEL
Anion gap: 9 (ref 5–15)
BUN: 11 mg/dL (ref 6–20)
CHLORIDE: 105 mmol/L (ref 101–111)
CO2: 22 mmol/L (ref 22–32)
Calcium: 8 mg/dL — ABNORMAL LOW (ref 8.9–10.3)
Creatinine, Ser: 0.77 mg/dL (ref 0.44–1.00)
GFR calc Af Amer: >60 mL/min (ref >60)
GLUCOSE: 118 mg/dL — AB (ref 65–99)
POTASSIUM: 4.4 mmol/L (ref 3.5–5.1)
Sodium: 136 mmol/L (ref 135–145)

## 2015-06-16 LAB — MAGNESIUM
Magnesium: 2.7 mg/dL — ABNORMAL HIGH (ref 1.7–2.4)
Magnesium: 2 mg/dL (ref 1.7–2.4)

## 2015-06-16 LAB — CREATININE, SERUM
CREATININE: 0.66 mg/dL (ref 0.44–1.00)
GFR calc Af Amer: >60 mL/min (ref >60)
GFR calc non Af Amer: >60 mL/min (ref >60)

## 2015-06-16 MED ORDER — POTASSIUM CHLORIDE 10 MEQ/50ML IV SOLN
10.0000 meq | INTRAVENOUS | Status: AC | PRN
Start: 2015-06-16 — End: 2015-06-16
  Administered 2015-06-16 (×3): 10 meq via INTRAVENOUS
  Filled 2015-06-16 (×3): qty 50

## 2015-06-16 MED ORDER — INSULIN ASPART 100 UNIT/ML ~~LOC~~ SOLN: 0.0000 [IU] | SUBCUTANEOUS | Status: DC

## 2015-06-16 MED ORDER — INSULIN ASPART 100 UNIT/ML ~~LOC~~ SOLN
0.0000 [IU] | SUBCUTANEOUS | Status: DC
Start: 2015-06-16 — End: 2015-06-17
  Administered 2015-06-16 – 2015-06-17 (×3): 2 [IU] via SUBCUTANEOUS

## 2015-06-16 MED ORDER — ASPIRIN EC 81 MG PO TBEC
81.0000 mg | DELAYED_RELEASE_TABLET | Freq: Every day | ORAL | Status: DC
  Administered 2015-06-16 – 2015-06-17 (×2): 81 mg via ORAL
  Filled 2015-06-16 (×2): qty 1

## 2015-06-16 MED ORDER — CETYLPYRIDINIUM CHLORIDE 0.05 % MT LIQD
7.0000 mL | Freq: Two times a day (BID) | OROMUCOSAL | Status: DC
  Administered 2015-06-16 – 2015-06-23 (×11): 7 mL via OROMUCOSAL

## 2015-06-16 MED ORDER — BUDESONIDE 0.25 MG/2ML IN SUSP
0.2500 mg | Freq: Two times a day (BID) | RESPIRATORY_TRACT | Status: DC
  Administered 2015-06-16 – 2015-06-23 (×11): 0.25 mg via RESPIRATORY_TRACT
  Filled 2015-06-16 (×14): qty 2

## 2015-06-16 MED ORDER — ALBUTEROL SULFATE (2.5 MG/3ML) 0.083% IN NEBU
2.5000 mg | INHALATION_SOLUTION | Freq: Two times a day (BID) | RESPIRATORY_TRACT | Status: DC
  Administered 2015-06-16 – 2015-06-18 (×4): 2.5 mg via RESPIRATORY_TRACT
  Filled 2015-06-16 (×8): qty 3

## 2015-06-16 MED ORDER — WARFARIN - PHYSICIAN DOSING INPATIENT
Freq: Every day | Status: DC
  Administered 2015-06-19 – 2015-06-21 (×3)

## 2015-06-16 MED ORDER — PANTOPRAZOLE SODIUM 40 MG PO TBEC
40.0000 mg | DELAYED_RELEASE_TABLET | Freq: Every day | ORAL | Status: DC
  Administered 2015-06-16 – 2015-06-17 (×2): 40 mg via ORAL
  Filled 2015-06-16 (×2): qty 1

## 2015-06-16 MED ORDER — FUROSEMIDE 10 MG/ML IJ SOLN
40.0000 mg | Freq: Once | INTRAMUSCULAR | Status: AC
  Administered 2015-06-16: 40 mg via INTRAVENOUS
  Filled 2015-06-16: qty 4

## 2015-06-16 MED ORDER — TIOTROPIUM BROMIDE MONOHYDRATE 18 MCG IN CAPS
18.0000 ug | ORAL_CAPSULE | Freq: Every day | RESPIRATORY_TRACT | Status: DC
  Administered 2015-06-16 – 2015-06-23 (×6): 18 ug via RESPIRATORY_TRACT
  Filled 2015-06-16 (×2): qty 5

## 2015-06-16 MED ORDER — BUPROPION HCL ER (SR) 150 MG PO TB12
150.0000 mg | ORAL_TABLET | Freq: Two times a day (BID) | ORAL | Status: DC
  Administered 2015-06-16 – 2015-06-23 (×15): 150 mg via ORAL
  Filled 2015-06-16 (×15): qty 1

## 2015-06-16 MED ORDER — MORPHINE SULFATE ER 15 MG PO TBCR
30.0000 mg | EXTENDED_RELEASE_TABLET | Freq: Two times a day (BID) | ORAL | Status: DC
  Administered 2015-06-16 – 2015-06-23 (×14): 30 mg via ORAL
  Filled 2015-06-16 (×2): qty 2
  Filled 2015-06-16: qty 1
  Filled 2015-06-16 (×3): qty 2
  Filled 2015-06-16: qty 1
  Filled 2015-06-16: qty 2
  Filled 2015-06-16 (×2): qty 1
  Filled 2015-06-16: qty 2
  Filled 2015-06-16: qty 1
  Filled 2015-06-16 (×3): qty 2

## 2015-06-16 MED ORDER — ALPRAZOLAM 0.5 MG PO TABS
1.0000 mg | ORAL_TABLET | Freq: Three times a day (TID) | ORAL | Status: DC | PRN
  Administered 2015-06-17 – 2015-06-22 (×6): 1 mg via ORAL
  Filled 2015-06-16 (×6): qty 2

## 2015-06-16 MED ORDER — INSULIN ASPART 100 UNIT/ML ~~LOC~~ SOLN
0.0000 [IU] | SUBCUTANEOUS | Status: DC
Start: 2015-06-16 — End: 2015-06-16

## 2015-06-16 MED ORDER — WARFARIN SODIUM 2.5 MG PO TABS
2.5000 mg | ORAL_TABLET | Freq: Once | ORAL | Status: AC
  Administered 2015-06-16: 2.5 mg via ORAL
  Filled 2015-06-16: qty 1

## 2015-06-16 MED ORDER — POTASSIUM CHLORIDE 10 MEQ/50ML IV SOLN
10.0000 meq | Freq: Once | INTRAVENOUS | Status: AC
  Administered 2015-06-16: 10 meq via INTRAVENOUS

## 2015-06-16 MED FILL — Sodium Chloride IV Soln 0.9%: INTRAVENOUS | Qty: 2000 | Status: AC

## 2015-06-16 MED FILL — Albumin, Human Inj 5%: INTRAVENOUS | Qty: 250 | Status: AC

## 2015-06-16 MED FILL — Heparin Sodium (Porcine) Inj 1000 Unit/ML: INTRAMUSCULAR | Qty: 30 | Status: AC

## 2015-06-16 MED FILL — Heparin Sodium (Porcine) Inj 1000 Unit/ML: INTRAMUSCULAR | Qty: 10 | Status: AC

## 2015-06-16 MED FILL — Electrolyte-R (PH 7.4) Solution: INTRAVENOUS | Qty: 5000 | Status: AC

## 2015-06-16 MED FILL — Magnesium Sulfate Inj 50%: INTRAMUSCULAR | Qty: 10 | Status: AC

## 2015-06-16 MED FILL — Lidocaine HCl IV Inj 20 MG/ML: INTRAVENOUS | Qty: 5 | Status: AC

## 2015-06-16 MED FILL — Sodium Bicarbonate IV Soln 8.4%: INTRAVENOUS | Qty: 50 | Status: AC

## 2015-06-16 MED FILL — Mannitol IV Soln 20%: INTRAVENOUS | Qty: 500 | Status: AC

## 2015-06-16 MED FILL — Heparin Sodium (Porcine) Inj 1000 Unit/ML: INTRAMUSCULAR | Qty: 2500 | Status: AC

## 2015-06-16 MED FILL — Potassium Chloride Inj 2 mEq/ML: INTRAVENOUS | Qty: 40 | Status: AC

## 2015-06-16 MED FILL — Dexmedetomidine HCl in NaCl 0.9% IV Soln 400 MCG/100ML: INTRAVENOUS | Qty: 100 | Status: AC

## 2015-06-16 NOTE — Progress Notes (Addendum)
1 Day Post-Op Procedure(s) (LRB): REDO STERNOTOMY (N/A) BENTALL PROCEDURE; HEMI-ARCH REPAIR WITH #23 ST JUDE MECHANICAL AVR CONDUIT AND #28 HEMASHIELD PLATINUM GRAFT (N/A) TRANSESOPHAGEAL ECHOCARDIOGRAM (TEE) (N/A) Subjective:  No complaints  Objective: Vital signs in last 24 hours: Temp:  [94.8 F (34.9 C)-99.1 F (37.3 C)] 99.1 F (37.3 C) (04/18 0600) Pulse Rate:  [40-95] 95 (04/18 0600) Cardiac Rhythm:  [-] Normal sinus rhythm (04/18 0400) Resp:  [10-24] 20 (04/18 0600) BP: (92-125)/(64-94) 118/93 mmHg (04/18 0600) SpO2:  [95 %-100 %] 96 % (04/18 0600) Arterial Line BP: (85-129)/(54-77) 120/77 mmHg (04/18 0600) FiO2 (%):  [40 %-50 %] 40 % (04/17 1810) Weight:  [81.9 kg (180 lb 8.9 oz)] 81.9 kg (180 lb 8.9 oz) (04/18 0442)  Hemodynamic parameters for last 24 hours: PAP: (18-43)/(4-27) 38/21 mmHg CO:  [3 L/min-6.4 L/min] 6.4 L/min CI:  [1.6 L/min/m2-3.5 L/min/m2] 3.5 L/min/m2  Intake/Output from previous day: 04/17 0701 - 04/18 0700 In: 4834.4 [P.O.:600; I.V.:2834.4; Blood:400; NG/GT:30; IV Piggyback:970] Out: [id number] [Urine:3180; Blood:1500; Chest Tube:310] Intake/Output this shift:    General appearance: alert and cooperative Neurologic: intact Heart: regular rate and rhythm, S1, S2 normal, no murmur, click, rub or gallop Lungs: clear to auscultation bilaterally Abdomen: soft, non-tender; bowel sounds normal; no masses,  no organomegaly Extremities: edema mild Wound: dressing dry  Lab Results:  Recent Labs  06/15/15 2126 06/16/15 0356  WBC 9.4 13.9*  HGB 10.4* 10.2*  HCT 32.0* 31.3*  PLT 139* 152   BMET:  Recent Labs  06/15/15 2042 06/15/15 2126 06/16/15 0356  NA 141  --  136  K 5.1  --  4.4  CL 107  --  105  CO2  --   --  22  GLUCOSE 124*  --  118*  BUN 17  --  11  CREATININE 0.80 0.95 0.77  CALCIUM  --   --  8.0*    PT/INR:  Recent Labs  06/15/15 1500  LABPROT 19.3*  INR 1.62*   ABG    Component Value Date/Time   PHART 7.309*  06/15/2015 2245   HCO3 21.0 06/15/2015 2245   TCO2 22 06/15/2015 2245   ACIDBASEDEF 5.0* 06/15/2015 2245   O2SAT 95.0 06/15/2015 2245   CBG (last 3)   Recent Labs  06/16/15 0028 06/16/15 0128 06/16/15 0222  GLUCAP 72 91 108*   CXR: OK  ECG: sinus rhythm, non-specific changes.  Assessment/Plan: S/P Procedure(s) (LRB): REDO STERNOTOMY (N/A) BENTALL PROCEDURE; HEMI-ARCH REPAIR WITH #23 ST JUDE MECHANICAL AVR CONDUIT AND #28 HEMASHIELD PLATINUM GRAFT (N/A) TRANSESOPHAGEAL ECHOCARDIOGRAM (TEE) (N/A)  Hemodynamically stable in sinus rhythm. Severe COPD: continue respiratory meds and IS Expected postop acute blood loss anemia: observe. Mobilize Diuresis d/c tubes/lines Continue foley due to diuresing patient and patient in ICU See progression orders  Start coumadin slowly tonight   LOS: 1 day    Gaye Pollack 06/16/2015

## 2015-06-16 NOTE — Progress Notes (Signed)
[facility] BRIEF SICU PROGRESS NOTE  1 Day Post-Op  S/P Procedure(s) (LRB): REDO STERNOTOMY (N/A) BENTALL PROCEDURE; HEMI-ARCH REPAIR WITH #23 ST JUDE MECHANICAL AVR CONDUIT AND #28 HEMASHIELD PLATINUM GRAFT (N/A) TRANSESOPHAGEAL ECHOCARDIOGRAM (TEE) (N/A)   Stable day Maintaining NSR w/ stable BP Breathing comfortably w/ O2 sats 96% on 2 L/min via [place] UOP adequate Labs okay  Plan: Continue current plan  Rexene Alberts, MD 06/16/2015 8:59 PM

## 2015-06-17 ENCOUNTER — Inpatient Hospital Stay (HOSPITAL_COMMUNITY): Payer: Medicaid Other

## 2015-06-17 LAB — CBC
HEMATOCRIT: 33 % — AB (ref 36.0–46.0)
Hemoglobin: 10.7 g/dL — ABNORMAL LOW (ref 12.0–15.0)
MCH: 28.8 pg (ref 26.0–34.0)
MCHC: 32.4 g/dL (ref 30.0–36.0)
MCV: 88.9 fL (ref 78.0–100.0)
PLATELETS: 161 10*3/uL (ref 150–400)
RBC: 3.71 MIL/uL — ABNORMAL LOW (ref 3.87–5.11)
RDW: 13.1 % (ref 11.5–15.5)
WBC: 17.2 10*3/uL — AB (ref 4.0–10.5)

## 2015-06-17 LAB — BASIC METABOLIC PANEL
Anion gap: 8 (ref 5–15)
BUN: 13 mg/dL (ref 6–20)
CALCIUM: 8.2 mg/dL — AB (ref 8.9–10.3)
CO2: 27 mmol/L (ref 22–32)
CREATININE: 0.58 mg/dL (ref 0.44–1.00)
Chloride: 97 mmol/L — ABNORMAL LOW (ref 101–111)
GFR calc Af Amer: >60 mL/min (ref >60)
GLUCOSE: 122 mg/dL — AB (ref 65–99)
POTASSIUM: 4.5 mmol/L (ref 3.5–5.1)
SODIUM: 132 mmol/L — AB (ref 135–145)

## 2015-06-17 LAB — GLUCOSE, CAPILLARY
GLUCOSE-CAPILLARY: 115 mg/dL — AB (ref 65–99)
Glucose-Capillary: 132 mg/dL — ABNORMAL HIGH (ref 65–99)

## 2015-06-17 LAB — PROTIME-INR
INR: 1.48 (ref 0.00–1.49)
PROTHROMBIN TIME: 18 s — AB (ref 11.6–15.2)

## 2015-06-17 MED ORDER — SODIUM CHLORIDE 0.9 % IV SOLN: 250.0000 mL | INTRAVENOUS | Status: DC | PRN

## 2015-06-17 MED ORDER — AMLODIPINE BESYLATE 5 MG PO TABS
5.0000 mg | ORAL_TABLET | Freq: Every day | ORAL | Status: DC
  Administered 2015-06-18 – 2015-06-19 (×2): 5 mg via ORAL
  Filled 2015-06-17 (×2): qty 1

## 2015-06-17 MED ORDER — BISACODYL 10 MG RE SUPP
10.0000 mg | Freq: Every day | RECTAL | Status: DC | PRN
  Administered 2015-06-23: 10 mg via RECTAL
  Filled 2015-06-17: qty 1

## 2015-06-17 MED ORDER — SODIUM CHLORIDE 0.9% FLUSH
3.0000 mL | Freq: Two times a day (BID) | INTRAVENOUS | Status: DC
  Administered 2015-06-17 – 2015-06-23 (×10): 3 mL via INTRAVENOUS

## 2015-06-17 MED ORDER — MOVING RIGHT ALONG BOOK
Freq: Once | Status: AC
  Administered 2015-06-17: 17:00:00
  Filled 2015-06-17: qty 1

## 2015-06-17 MED ORDER — WARFARIN SODIUM 5 MG PO TABS: 5.0000 mg | ORAL_TABLET | Freq: Once | ORAL | Status: DC

## 2015-06-17 MED ORDER — POTASSIUM CHLORIDE CRYS ER 20 MEQ PO TBCR
20.0000 meq | EXTENDED_RELEASE_TABLET | Freq: Two times a day (BID) | ORAL | Status: AC
  Administered 2015-06-17 – 2015-06-20 (×6): 20 meq via ORAL
  Filled 2015-06-17 (×6): qty 1

## 2015-06-17 MED ORDER — SODIUM CHLORIDE 0.9% FLUSH: 3.0000 mL | INTRAVENOUS | Status: DC | PRN

## 2015-06-17 MED ORDER — METOCLOPRAMIDE HCL 5 MG/ML IJ SOLN
10.0000 mg | Freq: Four times a day (QID) | INTRAMUSCULAR | Status: AC
  Administered 2015-06-17 (×2): 10 mg via INTRAVENOUS
  Filled 2015-06-17 (×2): qty 2

## 2015-06-17 MED ORDER — SIMETHICONE 80 MG PO CHEW
80.0000 mg | CHEWABLE_TABLET | Freq: Four times a day (QID) | ORAL | Status: DC
  Administered 2015-06-17 – 2015-06-23 (×21): 80 mg via ORAL
  Filled 2015-06-17 (×21): qty 1

## 2015-06-17 MED ORDER — WARFARIN SODIUM 5 MG PO TABS
5.0000 mg | ORAL_TABLET | Freq: Once | ORAL | Status: AC
  Administered 2015-06-17: 5 mg via ORAL
  Filled 2015-06-17: qty 1

## 2015-06-17 MED ORDER — HYDRALAZINE HCL 20 MG/ML IJ SOLN
10.0000 mg | INTRAMUSCULAR | Status: DC | PRN
  Administered 2015-06-17 – 2015-06-18 (×2): 10 mg via INTRAVENOUS
  Filled 2015-06-17 (×2): qty 1

## 2015-06-17 MED ORDER — ONDANSETRON HCL 4 MG PO TABS: 4.0000 mg | ORAL_TABLET | Freq: Four times a day (QID) | ORAL | Status: DC | PRN

## 2015-06-17 MED ORDER — LISINOPRIL 10 MG PO TABS
10.0000 mg | ORAL_TABLET | Freq: Every day | ORAL | Status: DC
  Administered 2015-06-18 – 2015-06-19 (×2): 10 mg via ORAL
  Filled 2015-06-17 (×2): qty 1

## 2015-06-17 MED ORDER — SIMVASTATIN 40 MG PO TABS
40.0000 mg | ORAL_TABLET | Freq: Every day | ORAL | Status: DC
  Administered 2015-06-17: 40 mg via ORAL
  Filled 2015-06-17: qty 1

## 2015-06-17 MED ORDER — PANTOPRAZOLE SODIUM 40 MG PO TBEC
40.0000 mg | DELAYED_RELEASE_TABLET | Freq: Every day | ORAL | Status: DC
  Administered 2015-06-18 – 2015-06-23 (×6): 40 mg via ORAL
  Filled 2015-06-17 (×6): qty 1

## 2015-06-17 MED ORDER — ONDANSETRON HCL 4 MG/2ML IJ SOLN
4.0000 mg | Freq: Four times a day (QID) | INTRAMUSCULAR | Status: DC | PRN
  Administered 2015-06-17: 4 mg via INTRAVENOUS
  Filled 2015-06-17: qty 2

## 2015-06-17 MED ORDER — BISACODYL 5 MG PO TBEC
10.0000 mg | DELAYED_RELEASE_TABLET | Freq: Every day | ORAL | Status: DC | PRN
  Administered 2015-06-20 – 2015-06-22 (×2): 10 mg via ORAL
  Filled 2015-06-17 (×2): qty 2

## 2015-06-17 MED ORDER — DOCUSATE SODIUM 100 MG PO CAPS
200.0000 mg | ORAL_CAPSULE | Freq: Every day | ORAL | Status: DC
  Administered 2015-06-18 – 2015-06-23 (×6): 200 mg via ORAL
  Filled 2015-06-17 (×6): qty 2

## 2015-06-17 MED ORDER — FUROSEMIDE 40 MG PO TABS
40.0000 mg | ORAL_TABLET | Freq: Every day | ORAL | Status: AC
  Administered 2015-06-17 – 2015-06-19 (×3): 40 mg via ORAL
  Filled 2015-06-17 (×3): qty 1

## 2015-06-17 MED ORDER — OXYCODONE HCL 5 MG PO TABS
5.0000 mg | ORAL_TABLET | ORAL | Status: DC | PRN
  Administered 2015-06-17 – 2015-06-19 (×5): 10 mg via ORAL
  Administered 2015-06-19: 5 mg via ORAL
  Administered 2015-06-20 – 2015-06-23 (×4): 10 mg via ORAL
  Filled 2015-06-17 (×6): qty 2
  Filled 2015-06-17: qty 1
  Filled 2015-06-17 (×3): qty 2

## 2015-06-17 NOTE — Progress Notes (Signed)
CT surgery p.m. Rounds  Doing well status post redo Bentall  Preop significant COPD-maintaining oxygen saturation 92% on nasal cannula\ Stable hemodynamics in sinus rhythm Complains of some gastric gas, abdomen nontender Mylicon tablets ordered

## 2015-06-17 NOTE — Progress Notes (Signed)
Scheduled neb not given, pt sleeping comfortably at this time no distress or complications  noted. Pt o2 saturation is 100% on a 2L [place]. PRN neb will be given IF needed. RN aware

## 2015-06-17 NOTE — Progress Notes (Signed)
2 Days Post-Op Procedure(s) (LRB): REDO STERNOTOMY (N/A) BENTALL PROCEDURE; HEMI-ARCH REPAIR WITH #23 ST JUDE MECHANICAL AVR CONDUIT AND #28 HEMASHIELD PLATINUM GRAFT (N/A) TRANSESOPHAGEAL ECHOCARDIOGRAM (TEE) (N/A) Subjective:  Feels ok. Coughing up some green sputum. Pain under adequate control  Objective: Vital signs in last 24 hours: Temp:  [97.4 F (36.3 C)-99.5 F (37.5 C)] 97.4 F (36.3 C) (04/19 0807) Pulse Rate:  [66-105] 94 (04/19 0800) Cardiac Rhythm:  [-] Normal sinus rhythm (04/19 0600) Resp:  [13-32] 19 (04/19 0800) BP: (106-138)/(68-94) 119/84 mmHg (04/19 0800) SpO2:  [90 %-99 %] 94 % (04/19 0800) Arterial Line BP: (101-128)/(66-90) 128/90 mmHg (04/18 1000) Weight:  [80 kg (176 lb 5.9 oz)] 80 kg (176 lb 5.9 oz) (04/19 0500)  Hemodynamic parameters for last 24 hours: PAP: (30-33)/(9-10) 33/9 mmHg  Intake/Output from previous day: 04/18 0701 - 04/19 0700 In: 480 [I.V.:180; IV Piggyback:300] Out: 2880 [Urine:2800; Chest Tube:80] Intake/Output this shift: Total I/O In: 40 [I.V.:40] Out: -   General appearance: alert and cooperative Neurologic: intact Heart: regular rate and rhythm and crisp valve sounds Lungs: clear to auscultation bilaterally Extremities: edema mild Wound: dressing dry  Lab Results:  Recent Labs  06/16/15 1712 06/16/15 1713 06/17/15 0400  WBC 12.7*  --  17.2*  HGB 10.7* 11.6* 10.7*  HCT 31.4* 34.0* 33.0*  PLT 141*  --  161   BMET:  Recent Labs  06/16/15 0356  06/16/15 1713 06/17/15 0400  NA 136  --  134* 132*  K 4.4  --  3.7 4.5  CL 105  --  95* 97*  CO2 22  --   --  27  GLUCOSE 118*  --  133* 122*  BUN 11  --  12 13  CREATININE 0.77  < > 0.60 0.58  CALCIUM 8.0*  --   --  8.2*  < > = values in this interval not displayed.  PT/INR:  Recent Labs  06/17/15 0400  LABPROT 18.0*  INR 1.48   ABG    Component Value Date/Time   PHART 7.309* 06/15/2015 2245   HCO3 21.0 06/15/2015 2245   TCO2 27 06/16/2015 1713   ACIDBASEDEF 5.0* 06/15/2015 2245   O2SAT 95.0 06/15/2015 2245   CBG (last 3)   Recent Labs  06/16/15 1919 06/16/15 2353 06/17/15 0403  GLUCAP 121* 134* 115*   CXR: mild left base atelectasis.  Assessment/Plan: S/P Procedure(s) (LRB): REDO STERNOTOMY (N/A) BENTALL PROCEDURE; HEMI-ARCH REPAIR WITH #23 ST JUDE MECHANICAL AVR CONDUIT AND #28 HEMASHIELD PLATINUM GRAFT (N/A) TRANSESOPHAGEAL ECHOCARDIOGRAM (TEE) (N/A)  She remains hemodynamically stable in sinus rhythm.   Mild volume excess: continue diuresis.  Severe COPD with smoking until a few weeks ago. Continue IS, pulmonary meds  Leukocytosis probably secondary to intraop steroids. Will follow.  Coumadin 5 mg today for mechanical valve.  Will transfer to stepdown 2W.   LOS: 2 days    Gaye Pollack 06/17/2015

## 2015-06-18 LAB — CBC
HEMATOCRIT: 32.5 % — AB (ref 36.0–46.0)
Hemoglobin: 10.4 g/dL — ABNORMAL LOW (ref 12.0–15.0)
MCH: 28.5 pg (ref 26.0–34.0)
MCHC: 32 g/dL (ref 30.0–36.0)
MCV: 89 fL (ref 78.0–100.0)
Platelets: 162 10*3/uL (ref 150–400)
RBC: 3.65 MIL/uL — ABNORMAL LOW (ref 3.87–5.11)
RDW: 13 % (ref 11.5–15.5)
WBC: 13.4 10*3/uL — ABNORMAL HIGH (ref 4.0–10.5)

## 2015-06-18 LAB — BASIC METABOLIC PANEL
Anion gap: 10 (ref 5–15)
BUN: 12 mg/dL (ref 6–20)
CALCIUM: 8.2 mg/dL — AB (ref 8.9–10.3)
CHLORIDE: 96 mmol/L — AB (ref 101–111)
CO2: 29 mmol/L (ref 22–32)
CREATININE: 0.64 mg/dL (ref 0.44–1.00)
GFR calc non Af Amer: >60 mL/min (ref >60)
Glucose, Bld: 107 mg/dL — ABNORMAL HIGH (ref 65–99)
Potassium: 3.9 mmol/L (ref 3.5–5.1)
Sodium: 135 mmol/L (ref 135–145)

## 2015-06-18 LAB — PROTIME-INR
INR: 2.44 — AB (ref 0.00–1.49)
Prothrombin Time: 26.2 seconds — ABNORMAL HIGH (ref 11.6–15.2)

## 2015-06-18 MED ORDER — ATORVASTATIN CALCIUM 20 MG PO TABS
20.0000 mg | ORAL_TABLET | Freq: Every day | ORAL | Status: DC
  Administered 2015-06-18 – 2015-06-23 (×6): 20 mg via ORAL
  Filled 2015-06-18 (×6): qty 1

## 2015-06-18 MED ORDER — WARFARIN SODIUM 2.5 MG PO TABS: 2.5000 mg | ORAL_TABLET | Freq: Once | ORAL | Status: DC

## 2015-06-18 MED ORDER — SUMATRIPTAN SUCCINATE 100 MG PO TABS
100.0000 mg | ORAL_TABLET | ORAL | Status: DC | PRN
  Administered 2015-06-18 – 2015-06-22 (×5): 100 mg via ORAL
  Filled 2015-06-18 (×10): qty 1

## 2015-06-18 NOTE — Progress Notes (Signed)
3 Days Post-Op Procedure(s) (LRB): REDO STERNOTOMY (N/A) BENTALL PROCEDURE; HEMI-ARCH REPAIR WITH #23 ST JUDE MECHANICAL AVR CONDUIT AND #28 HEMASHIELD PLATINUM GRAFT (N/A) TRANSESOPHAGEAL ECHOCARDIOGRAM (TEE) (N/A) Subjective:  Complains of headache this am similar to her previous migraines and has been present since yesterday. Not relieved with pain meds. Chest wall pain well-controlled.  Objective: Vital signs in last 24 hours: Temp:  [97.4 F (36.3 C)-98.6 F (37 C)] 98.1 F (36.7 C) (04/20 1100) Pulse Rate:  [43-115] 102 (04/20 1000) Cardiac Rhythm:  [-] Normal sinus rhythm (04/20 0800) Resp:  [11-23] 14 (04/20 1000) BP: (100-144)/(73-119) 107/76 mmHg (04/20 1000) SpO2:  [50 %-100 %] 100 % (04/20 1000) Weight:  [79.379 kg (175 lb)] 79.379 kg (175 lb) (04/20 0400)  Hemodynamic parameters for last 24 hours:    Intake/Output from previous day: 04/19 0701 - 04/20 0700 In: 440 [P.O.:390; I.V.:50] Out: 1345 [Urine:1345] Intake/Output this shift:    General appearance: alert and cooperative Neurologic: intact Heart: regular rate and rhythm and crisp mechanical valve click Lungs: clear to auscultation bilaterally Extremities: extremities normal, atraumatic, no cyanosis or edema Wound: dressing dry  Lab Results:  Recent Labs  06/17/15 0400 06/18/15 0256  WBC 17.2* 13.4*  HGB 10.7* 10.4*  HCT 33.0* 32.5*  PLT 161 162   BMET:  Recent Labs  06/17/15 0400 06/18/15 0256  NA 132* 135  K 4.5 3.9  CL 97* 96*  CO2 27 29  GLUCOSE 122* 107*  BUN 13 12  CREATININE 0.58 0.64  CALCIUM 8.2* 8.2*    PT/INR:  Recent Labs  06/18/15 0256  LABPROT 26.2*  INR 2.44*   ABG    Component Value Date/Time   PHART 7.309* 06/15/2015 2245   HCO3 21.0 06/15/2015 2245   TCO2 27 06/16/2015 1713   ACIDBASEDEF 5.0* 06/15/2015 2245   O2SAT 95.0 06/15/2015 2245   CBG (last 3)   Recent Labs  06/16/15 2353 06/17/15 0403 06/17/15 0803  GLUCAP 134* 115* 132*     Assessment/Plan: S/P Procedure(s) (LRB): REDO STERNOTOMY (N/A) BENTALL PROCEDURE; HEMI-ARCH REPAIR WITH #23 ST JUDE MECHANICAL AVR CONDUIT AND #28 HEMASHIELD PLATINUM GRAFT (N/A) TRANSESOPHAGEAL ECHOCARDIOGRAM (TEE) (N/A)  She has been hypertensive overnight possibly related to pain. Started on Norvasc and lisinopril, hydralazine prn. No beta blocker in this patient due to severe COPD on multiple pulmonary meds.  Headache possibly migraine. Will resume Imitrex which she has taken before.  INR jumped up to 2.44. Will hold off on coumadin tonight.  Continue mobilization, IS  Continue diuresis for volume excess.   LOS: 3 days    Gaye Pollack 06/18/2015

## 2015-06-18 NOTE — Care Management Note (Signed)
Case Management Note  Patient Details  Name: Lindsay Bonilla MRN: [id number] Date of Birth: Apr 28, 1960  Subjective/Objective:  Pt's 2 adult sons are in room and indicate they will be available to stay with her during the day as needed and dtr will be available evenings and nights.  Pt OOB to chair, feels well except for headache.                  Expected Discharge Plan:  Home/Self Care  Discharge planning Services  CM Consult  Status of Service:  In process, will continue to follow  Girard Cooter, RN 06/18/2015, 1:06 PM

## 2015-06-18 NOTE — Progress Notes (Signed)
Placed patient on 3L nasal cannula following difficulty getting a SpO2 and then finding she was 80% on RA

## 2015-06-18 NOTE — Progress Notes (Signed)
Talked to MD Prescott Gum regarding pt HTN MAP>100 and SBP>140. Received orders for PRN hydralazine and daily norvasc and lisinopril starting in AM. Administered PRN hydralazine, will monitor BP and pt condition.   Henreitta Leber, RN 12:28 AM 06/18/2015

## 2015-06-19 LAB — TYPE AND SCREEN
ABO/RH(D): A POS
Antibody Screen: POSITIVE
DAT, IgG: NEGATIVE
DONOR AG TYPE: NEGATIVE
Donor AG Type: NEGATIVE
Donor AG Type: NEGATIVE
Donor AG Type: NEGATIVE
PT AG TYPE: NEGATIVE
UNIT DIVISION: 0
UNIT DIVISION: 0
UNIT DIVISION: 0
Unit division: 0

## 2015-06-19 LAB — PROTIME-INR
INR: 2.5 — ABNORMAL HIGH (ref 0.00–1.49)
Prothrombin Time: 26.7 seconds — ABNORMAL HIGH (ref 11.6–15.2)

## 2015-06-19 MED ORDER — DILTIAZEM HCL 100 MG IV SOLR
5.0000 mg/h | INTRAVENOUS | Status: DC
  Administered 2015-06-20 – 2015-06-21 (×4): 10 mg/h via INTRAVENOUS
  Filled 2015-06-19 (×4): qty 100

## 2015-06-19 MED ORDER — DIGOXIN 0.25 MG/ML IJ SOLN
0.2500 mg | Freq: Once | INTRAMUSCULAR | Status: AC
  Administered 2015-06-19: 0.25 mg via INTRAVENOUS
  Filled 2015-06-19: qty 2

## 2015-06-19 MED ORDER — WARFARIN SODIUM 2.5 MG PO TABS
2.5000 mg | ORAL_TABLET | Freq: Once | ORAL | Status: AC
  Administered 2015-06-19: 2.5 mg via ORAL
  Filled 2015-06-19: qty 1

## 2015-06-19 MED ORDER — SODIUM CHLORIDE 0.9 % IV BOLUS (SEPSIS)
500.0000 mL | Freq: Once | INTRAVENOUS | Status: AC
  Administered 2015-06-19: 500 mL via INTRAVENOUS

## 2015-06-19 MED ORDER — DILTIAZEM LOAD VIA INFUSION
10.0000 mg | Freq: Once | INTRAVENOUS | Status: AC
  Administered 2015-06-20: 10 mg via INTRAVENOUS
  Filled 2015-06-19: qty 10

## 2015-06-19 MED ORDER — DIGOXIN 0.25 MG/ML IJ SOLN
0.5000 mg | Freq: Once | INTRAMUSCULAR | Status: AC
  Administered 2015-06-19: 0.5 mg via INTRAVENOUS
  Filled 2015-06-19: qty 2

## 2015-06-19 MED ORDER — DILTIAZEM HCL 100 MG IV SOLR
INTRAVENOUS | Status: AC
  Filled 2015-06-19: qty 100

## 2015-06-19 MED ORDER — COUMADIN BOOK
Freq: Once | Status: AC
  Administered 2015-06-19: 12:00:00
  Filled 2015-06-19: qty 1

## 2015-06-19 MED ORDER — WARFARIN VIDEO
Freq: Once | Status: AC
  Administered 2015-06-19: 12:00:00

## 2015-06-19 NOTE — Progress Notes (Addendum)
      [address]       [place] [address]             [phone]      4 Days Post-Op Procedure(s) (LRB): REDO STERNOTOMY (N/A) BENTALL PROCEDURE; HEMI-ARCH REPAIR WITH #23 ST JUDE MECHANICAL AVR CONDUIT AND #28 HEMASHIELD PLATINUM GRAFT (N/A) TRANSESOPHAGEAL ECHOCARDIOGRAM (TEE) (N/A)   Subjective:  No new complaints.  Continues to have some mild headache pain, but it has improved some with Imitrex.  Objective: Vital signs in last 24 hours: Temp:  [97.3 F (36.3 C)-99 F (37.2 C)] 98.2 F (36.8 C) (04/21 0451) Pulse Rate:  [66-105] 105 (04/21 0451) Cardiac Rhythm:  [-] Normal sinus rhythm (04/21 0724) Resp:  [14-24] 18 (04/21 0451) BP: (87-128)/(49-77) 128/69 mmHg (04/21 0451) SpO2:  [92 %-100 %] 93 % (04/21 0451) Weight:  [175 lb 8 oz (79.606 kg)] 175 lb 8 oz (79.606 kg) (04/21 0451)  Intake/Output from previous day: 04/20 0701 - 04/21 0700 In: 120 [P.O.:120] Out: 250 [Urine:250]  General appearance: alert, cooperative and no distress Heart: regular rate and rhythm Lungs: clear to auscultation bilaterally Abdomen: soft, non-tender; bowel sounds normal; no masses,  no organomegaly Extremities: edema trace Wound: clean and dry  Lab Results:  Recent Labs  06/17/15 0400 06/18/15 0256  WBC 17.2* 13.4*  HGB 10.7* 10.4*  HCT 33.0* 32.5*  PLT 161 162   BMET:  Recent Labs  06/17/15 0400 06/18/15 0256  NA 132* 135  K 4.5 3.9  CL 97* 96*  CO2 27 29  GLUCOSE 122* 107*  BUN 13 12  CREATININE 0.58 0.64  CALCIUM 8.2* 8.2*    PT/INR:  Recent Labs  06/19/15 0317  LABPROT 26.7*  INR 2.50*   ABG    Component Value Date/Time   PHART 7.309* 06/15/2015 2245   HCO3 21.0 06/15/2015 2245   TCO2 27 06/16/2015 1713   ACIDBASEDEF 5.0* 06/15/2015 2245   O2SAT 95.0 06/15/2015 2245   CBG (last 3)   Recent Labs  06/16/15 2353 06/17/15 0403 06/17/15 0803  GLUCAP 134* 115* 132*    Assessment/Plan: S/P Procedure(s) (LRB): REDO  STERNOTOMY (N/A) BENTALL PROCEDURE; HEMI-ARCH REPAIR WITH #23 ST JUDE MECHANICAL AVR CONDUIT AND #28 HEMASHIELD PLATINUM GRAFT (N/A) TRANSESOPHAGEAL ECHOCARDIOGRAM (TEE) (N/A)  1. CV- NSR, hemodynamically stable, BP improved continue Norvasc, Lisinopril 2. Pulm- severe COPD, uses oxygen at home, continue home inhalers, IS 3. Renal- creatinine WNL, + hypervolemia, continue Lasix 4. INR trended upward again, currently at 2.50, will hold Coumadin tonight 5. Dispo- patient stable, headache improved, will hold coumadin again tonight, BP under better control, continue current care   LOS: 4 days    Ellwood Handler 06/19/2015   Chart reviewed, patient examined, agree with above. She has a long history of migraine headaches that respond to Imitrex but not to narcotics. If these persist she will need a CTA of the brain to rule out any intracranial pathology. She had a head CT without contrast in 2002 for workup of headaches that was negative. Her INR has plateaued so will give 2.5 mg coumadin tonight.

## 2015-06-19 NOTE — Progress Notes (Signed)
Patient ID: Lindsay Bonilla, female   DOB: 1960/05/30, 55 y.o.   MRN: [id number]  Her headache is gone at this time after Imitrex earlier. She went into atrial flutter 150's this afternoon. I did atrial ECG that confirmed this with atrial rate of 300. Will start cardizem drip. With her severe COPD she can't be on beta blocker and should not be on amiodarone.

## 2015-06-19 NOTE — Progress Notes (Signed)
Scheduled ventolin neb not given, pt has increase heart rate in the 130's-140's RN aware. Family at bedside

## 2015-06-19 NOTE — Progress Notes (Signed)
Utilization review completed.  

## 2015-06-19 NOTE — Progress Notes (Signed)
1330 Heart rate 130 -150. Talked with RN. Will hold ambulation right now and follow up as time permits. Will continue to follow. Graylon Good RN BSN 06/19/2015 1:27 PM

## 2015-06-20 ENCOUNTER — Inpatient Hospital Stay (HOSPITAL_COMMUNITY): Payer: Medicaid Other

## 2015-06-20 LAB — PROTIME-INR
INR: 1.58 — AB (ref 0.00–1.49)
PROTHROMBIN TIME: 18.9 s — AB (ref 11.6–15.2)

## 2015-06-20 MED ORDER — POLYETHYLENE GLYCOL 3350 17 G PO PACK
17.0000 g | PACK | Freq: Every day | ORAL | Status: DC | PRN
  Administered 2015-06-20 – 2015-06-23 (×2): 17 g via ORAL
  Filled 2015-06-20 (×2): qty 1

## 2015-06-20 MED ORDER — FUROSEMIDE 10 MG/ML IJ SOLN
40.0000 mg | Freq: Once | INTRAMUSCULAR | Status: AC
  Administered 2015-06-20: 40 mg via INTRAVENOUS
  Filled 2015-06-20: qty 4

## 2015-06-20 MED ORDER — DILTIAZEM HCL ER COATED BEADS 120 MG PO CP24
120.0000 mg | ORAL_CAPSULE | Freq: Two times a day (BID) | ORAL | Status: DC
  Administered 2015-06-20 – 2015-06-23 (×6): 120 mg via ORAL
  Filled 2015-06-20 (×7): qty 1

## 2015-06-20 MED ORDER — WARFARIN SODIUM 2.5 MG PO TABS
2.5000 mg | ORAL_TABLET | Freq: Once | ORAL | Status: AC
  Administered 2015-06-20: 2.5 mg via ORAL
  Filled 2015-06-20: qty 1

## 2015-06-20 MED ORDER — LEVALBUTEROL HCL 1.25 MG/0.5ML IN NEBU
1.2500 mg | INHALATION_SOLUTION | Freq: Four times a day (QID) | RESPIRATORY_TRACT | Status: DC | PRN
  Administered 2015-06-20 – 2015-06-22 (×2): 1.25 mg via RESPIRATORY_TRACT
  Filled 2015-06-20 (×2): qty 0.5

## 2015-06-20 MED ORDER — DILTIAZEM HCL ER COATED BEADS 120 MG PO CP24: 120.0000 mg | ORAL_CAPSULE | Freq: Every day | ORAL | Status: DC

## 2015-06-20 MED ORDER — WARFARIN VIDEO
Freq: Once | Status: AC
  Administered 2015-06-20: 16:00:00

## 2015-06-20 MED ORDER — ENOXAPARIN SODIUM 30 MG/0.3ML ~~LOC~~ SOLN
30.0000 mg | SUBCUTANEOUS | Status: DC
  Administered 2015-06-20 – 2015-06-21 (×2): 30 mg via SUBCUTANEOUS
  Filled 2015-06-20 (×2): qty 0.3

## 2015-06-20 MED ORDER — LEVALBUTEROL HCL 0.63 MG/3ML IN NEBU
1.2500 mg | INHALATION_SOLUTION | Freq: Four times a day (QID) | RESPIRATORY_TRACT | Status: DC
  Administered 2015-06-20 – 2015-06-21 (×2): 1.25 mg via RESPIRATORY_TRACT
  Filled 2015-06-20 (×3): qty 6

## 2015-06-20 MED ORDER — DIGOXIN 125 MCG PO TABS
0.2500 mg | ORAL_TABLET | Freq: Every day | ORAL | Status: DC
  Administered 2015-06-20 – 2015-06-23 (×4): 0.25 mg via ORAL
  Filled 2015-06-20 (×4): qty 2

## 2015-06-20 MED ORDER — LEVALBUTEROL HCL 0.63 MG/3ML IN NEBU: 0.6300 mg | INHALATION_SOLUTION | Freq: Two times a day (BID) | RESPIRATORY_TRACT | Status: DC

## 2015-06-20 NOTE — Discharge Instructions (Addendum)
Information on my medicine - Coumadin   (Warfarin)  This medication education was reviewed with me or my healthcare representative as part of my discharge preparation.  The pharmacist that spoke with me during my hospital stay was:  Wayland Salinas, [facility]  Why was Coumadin prescribed for you? Coumadin was prescribed for you because you have a blood clot or a medical condition that can cause an increased risk of forming blood clots. Blood clots can cause serious health problems by blocking the flow of blood to the heart, lung, or brain. Coumadin can prevent harmful blood clots from forming. As a reminder your indication for Coumadin is:   Blood Clot Prevention After Heart Valve Surgery  What test will check on my response to Coumadin? While on Coumadin (warfarin) you will need to have an INR test regularly to ensure that your dose is keeping you in the desired range. The INR (international normalized ratio) number is calculated from the result of the laboratory test called prothrombin time (PT).  If an INR APPOINTMENT HAS NOT ALREADY BEEN MADE FOR YOU please schedule an appointment to have this lab work done by your health care provider within 7 days. Your INR goal is usually a number between:  2 to 3 or your provider may give you a more narrow range like 2-2.5.  Ask your health care provider during an office visit what your goal INR is.  What  do you need to  know  About  COUMADIN? Take Coumadin (warfarin) exactly as prescribed by your healthcare provider about the same time each day.  DO NOT stop taking without talking to the doctor who prescribed the medication.  Stopping without other blood clot prevention medication to take the place of Coumadin may increase your risk of developing a new clot or stroke.  Get refills before you run out.  What do you do if you miss a dose? If you miss a dose, take it as soon as you remember on the same day then continue your regularly scheduled  regimen the next day.  Do not take two doses of Coumadin at the same time.  Important Safety Information A possible side effect of Coumadin (Warfarin) is an increased risk of bleeding. You should call your healthcare provider right away if you experience any of the following: ? Bleeding from an injury or your nose that does not stop. ? Unusual colored urine (red or dark brown) or unusual colored stools (red or black). ? Unusual bruising for unknown reasons. ? A serious fall or if you hit your head (even if there is no bleeding).  Some foods or medicines interact with Coumadin (warfarin) and might alter your response to warfarin. To help avoid this: ? Eat a balanced diet, maintaining a consistent amount of Vitamin K. ? Notify your provider about major diet changes you plan to make. ? Avoid alcohol or limit your intake to 1 drink for women and 2 drinks for men per day. (1 drink is 5 oz. wine, 12 oz. beer, or 1.5 oz. liquor.)  Make sure that ANY health care provider who prescribes medication for you knows that you are taking Coumadin (warfarin).  Also make sure the healthcare provider who is monitoring your Coumadin knows when you have started a new medication including herbals and non-prescription products.  Coumadin (Warfarin)  Major Drug Interactions  Increased Warfarin Effect Decreased Warfarin Effect  Alcohol (large quantities) Antibiotics (esp. Septra/Bactrim, Flagyl, Cipro) Amiodarone (Cordarone) Aspirin (ASA) Cimetidine (Tagamet) Megestrol (Megace) NSAIDs (  ibuprofen, naproxen, etc.) Piroxicam (Feldene) Propafenone (Rythmol SR) Propranolol (Inderal) Isoniazid (INH) Posaconazole (Noxafil) Barbiturates (Phenobarbital) Carbamazepine (Tegretol) Chlordiazepoxide (Librium) Cholestyramine (Questran) Griseofulvin Oral Contraceptives Rifampin Sucralfate (Carafate) Vitamin K   Coumadin (Warfarin) Major Herbal Interactions  Increased Warfarin Effect Decreased Warfarin Effect    Garlic Ginseng Ginkgo biloba Coenzyme Q10 Green tea St. Johns wort    Coumadin (Warfarin) FOOD Interactions  Eat a consistent number of servings per week of foods HIGH in Vitamin K (1 serving =  cup)  Collards (cooked, or boiled & drained) Kale (cooked, or boiled & drained) Mustard greens (cooked, or boiled & drained) Parsley *serving size only =  cup Spinach (cooked, or boiled & drained) Swiss chard (cooked, or boiled & drained) Turnip greens (cooked, or boiled & drained)  Eat a consistent number of servings per week of foods MEDIUM-HIGH in Vitamin K (1 serving = 1 cup)  Asparagus (cooked, or boiled & drained) Broccoli (cooked, boiled & drained, or raw & chopped) Brussel sprouts (cooked, or boiled & drained) *serving size only =  cup Lettuce, raw (green leaf, endive, romaine) Spinach, raw Turnip greens, raw & chopped   These websites have more information on Coumadin (warfarin):  FailFactory.se; VeganReport.com.au;   Aortic Valve Replacement, Care After Refer to this sheet in the next few weeks. These instructions provide you with information on caring for yourself after your procedure. Your health care provider may also give you specific instructions. Your treatment has been planned according to current medical practices, but problems sometimes occur. Call your health care provider if you have any problems or questions after your procedure. HOME CARE INSTRUCTIONS   Take medicines only as directed by your health care provider.  If your health care provider has prescribed elastic stockings, wear them as directed.  Take frequent naps or rest often throughout the day.  Avoid lifting over 10 lbs (4.5 kg) or pushing or pulling things with your arms for 6-8 weeks or as directed by your health care provider.  Avoid driving or airplane travel for 4-6 weeks after surgery or as directed by your health care provider. If you are riding in a car for an extended  period, stop every 1-2 hours to stretch your legs. Keep a record of your medicines and medical history with you when traveling.  Do not drive or operate heavy machinery while taking pain medicine. (narcotics).  Do not cross your legs.  Do not use any tobacco products including cigarettes, chewing tobacco, or electronic cigarettes. If you need help quitting, ask your health care provider.  Do not take baths, swim, or use a hot tub until your health care provider approves. Take showers once your health care provider approves. Pat incisions dry. Do not rub incisions with a washcloth or towel.  Avoid climbing stairs and using the handrail to pull yourself up for the first 2-3 weeks after surgery.  Return to work as directed by your health care provider.  Drink enough fluid to keep your urine clear or pale yellow.  Do not strain to have a bowel movement. Eat high-fiber foods if you become constipated. You may also take a medicine to help you have a bowel movement (laxative) as directed by your health care provider.  Resume sexual activity as directed by your health care provider. Men should not use medicines for erectile dysfunction until their doctor says it isokay.  If you had a certain type of heart condition in the past, you may need to take antibiotic medicine before having dental  work or surgery. Let your dentist and health care providers know if you had one or more of the following:  Previous endocarditis.  An artificial (prosthetic) heart valve.  Congenital heart disease. SEEK MEDICAL CARE IF:  You develop a skin rash.   You experience sudden changes in your weight.  You have a fever. SEEK IMMEDIATE MEDICAL CARE IF:   You develop chest pain that is not coming from your incision.  You have drainage (pus), redness, swelling, or pain at your incision site.   You develop shortness of breath or have difficulty breathing.   You have increased bleeding from your incision  site.   You develop light-headedness.  MAKE SURE YOU:   Understand these directions.  Will watch your condition.  Will get help right away if you are not doing well or get worse.   This information is not intended to replace advice given to you by your health care provider. Make sure you discuss any questions you have with your health care provider.   Document Released: 09/02/2004 Document Revised: 03/07/2014 Document Reviewed: 11/29/2011 Elsevier Interactive Patient Education [employer].

## 2015-06-20 NOTE — Progress Notes (Addendum)
      [address]       [place] [address]             [phone]      5 Days Post-Op Procedure(s) (LRB): REDO STERNOTOMY (N/A) BENTALL PROCEDURE; HEMI-ARCH REPAIR WITH #23 ST JUDE MECHANICAL AVR CONDUIT AND #28 HEMASHIELD PLATINUM GRAFT (N/A) TRANSESOPHAGEAL ECHOCARDIOGRAM (TEE) (N/A)   Subjective:  Lindsay Bonilla complains of being very short of breath this morning.  Her headache has significantly improved.  Objective: Vital signs in last 24 hours: Temp:  [97.6 F (36.4 C)-98.6 F (37 C)] 97.9 F (36.6 C) (04/22 0310) Pulse Rate:  [77-142] 111 (04/22 0310) Cardiac Rhythm:  [-] Atrial flutter (04/22 0011) Resp:  [18] 18 (04/22 0310) BP: (85-117)/(54-92) 117/92 mmHg (04/22 0310) SpO2:  [95 %-97 %] 95 % (04/22 0808) Weight:  [175 lb 12.8 oz (79.742 kg)] 175 lb 12.8 oz (79.742 kg) (04/22 0310)   Intake/Output this shift: Total I/O In: 120 [P.O.:120] Out: -   General appearance: alert, cooperative and no distress Heart: regularly irregular rhythm Lungs: diminished breath sounds bibasilar Abdomen: soft, non-tender; bowel sounds normal; no masses,  no organomegaly Extremities: edema trace Wound: clean and dry  Lab Results:  Recent Labs  06/18/15 0256  WBC 13.4*  HGB 10.4*  HCT 32.5*  PLT 162   BMET:  Recent Labs  06/18/15 0256  NA 135  K 3.9  CL 96*  CO2 29  GLUCOSE 107*  BUN 12  CREATININE 0.64  CALCIUM 8.2*    PT/INR:  Recent Labs  06/20/15 0322  LABPROT 18.9*  INR 1.58*   ABG    Component Value Date/Time   PHART 7.309* 06/15/2015 2245   HCO3 21.0 06/15/2015 2245   TCO2 27 06/16/2015 1713   ACIDBASEDEF 5.0* 06/15/2015 2245   O2SAT 95.0 06/15/2015 2245   CBG (last 3)  No results for input(s): GLUCAP in the last 72 hours.  Assessment/Plan: S/P Procedure(s) (LRB): REDO STERNOTOMY (N/A) BENTALL PROCEDURE; HEMI-ARCH REPAIR WITH #23 ST JUDE MECHANICAL AVR CONDUIT AND #28 HEMASHIELD PLATINUM GRAFT (N/A) TRANSESOPHAGEAL  ECHOCARDIOGRAM (TEE) (N/A)  1. CV- A.Flutter rate in the 100s- will start Digoxin at 0.25, IV cardizem ordered yesterday but not given due to hypotension, will order tablet at low dose with parameters in place... No beta blocker due to severe COPD 2. Pulm- dysnpea this morning, will get repeat CXR, continue nebs, inhalers, will give dose of IV Lasix for hypervolemia 3. INR-1.59, will repeat coumadin at 2.5 mg daily 4. Renal- Hypervolemic, will give dose of IV lasix today 5. Neuro- H/O migraines on home Imitrex 6. Dispo- patient with dyspnea this morning will give lasix, repeat CXR.... A. Flutter loaded with IV Dig yesterday, start oral regimen, add Cardizem if pressure allows, continue coumadin for mechanical valve   LOS: 5 days    BARRETT, ERIN 06/20/2015  I have seen and examined the patient and agree with the assessment and plan as outlined.  Lindsay Bonilla is quite short of breath.  She has not been receiving nebs due to Aflutter.  Will give Xopenex every 6 hours and as needed.  Will start digoxin and increase oral Cardizem CD.  Wean Cardizem drip once HR under better control.  Move to step-down for closer observation.  Rexene Alberts, MD 06/20/2015 10:50 AM

## 2015-06-20 NOTE — Progress Notes (Signed)
1300 Talked with pt's RN. Will hold ambulation today. Graylon Good RN BSN 06/20/2015 1:46 PM

## 2015-06-21 DIAGNOSIS — I4892 Unspecified atrial flutter: Secondary | ICD-10-CM

## 2015-06-21 LAB — BLOOD GAS, ARTERIAL
Acid-Base Excess: 8.2 mmol/L — ABNORMAL HIGH (ref 0.0–2.0)
BICARBONATE: 32.3 meq/L — AB (ref 20.0–24.0)
DRAWN BY: 236041
O2 CONTENT: 3 L/min
O2 Saturation: 94.8 %
PATIENT TEMPERATURE: 98.6
PCO2 ART: 46 mmHg — AB (ref 35.0–45.0)
PH ART: 7.461 — AB (ref 7.350–7.450)
PO2 ART: 71.7 mmHg — AB (ref 80.0–100.0)
TCO2: 33.8 mmol/L (ref 0–100)

## 2015-06-21 LAB — CBC
HCT: 27.3 % — ABNORMAL LOW (ref 36.0–46.0)
HEMOGLOBIN: 8.7 g/dL — AB (ref 12.0–15.0)
MCH: 28.6 pg (ref 26.0–34.0)
MCHC: 31.9 g/dL (ref 30.0–36.0)
MCV: 89.8 fL (ref 78.0–100.0)
Platelets: 288 10*3/uL (ref 150–400)
RBC: 3.04 MIL/uL — AB (ref 3.87–5.11)
RDW: 13.1 % (ref 11.5–15.5)
WBC: 8.6 10*3/uL (ref 4.0–10.5)

## 2015-06-21 LAB — COMPREHENSIVE METABOLIC PANEL
ALK PHOS: 63 U/L (ref 38–126)
ALT: 57 U/L — ABNORMAL HIGH (ref 14–54)
ANION GAP: 9 (ref 5–15)
AST: 59 U/L — ABNORMAL HIGH (ref 15–41)
Albumin: 2.8 g/dL — ABNORMAL LOW (ref 3.5–5.0)
BUN: 10 mg/dL (ref 6–20)
CO2: 32 mmol/L (ref 22–32)
Calcium: 8.6 mg/dL — ABNORMAL LOW (ref 8.9–10.3)
Chloride: 94 mmol/L — ABNORMAL LOW (ref 101–111)
Creatinine, Ser: 0.6 mg/dL (ref 0.44–1.00)
GFR calc non Af Amer: >60 mL/min (ref >60)
Glucose, Bld: 125 mg/dL — ABNORMAL HIGH (ref 65–99)
POTASSIUM: 4 mmol/L (ref 3.5–5.1)
SODIUM: 135 mmol/L (ref 135–145)
Total Bilirubin: 0.8 mg/dL (ref 0.3–1.2)
Total Protein: 5.7 g/dL — ABNORMAL LOW (ref 6.5–8.1)

## 2015-06-21 LAB — PROTIME-INR
INR: 1.76 — AB (ref 0.00–1.49)
PROTHROMBIN TIME: 20.5 s — AB (ref 11.6–15.2)

## 2015-06-21 MED ORDER — LEVALBUTEROL HCL 0.63 MG/3ML IN NEBU
1.2500 mg | INHALATION_SOLUTION | Freq: Three times a day (TID) | RESPIRATORY_TRACT | Status: DC
  Administered 2015-06-21: 1.25 mg via RESPIRATORY_TRACT
  Administered 2015-06-21: 1.26 mg via RESPIRATORY_TRACT
  Administered 2015-06-22 (×2): 1.25 mg via RESPIRATORY_TRACT
  Administered 2015-06-23: 0.63 mg via RESPIRATORY_TRACT
  Administered 2015-06-23: 1.25 mg via RESPIRATORY_TRACT
  Filled 2015-06-21 (×8): qty 6

## 2015-06-21 MED ORDER — POTASSIUM CHLORIDE CRYS ER 20 MEQ PO TBCR
20.0000 meq | EXTENDED_RELEASE_TABLET | Freq: Two times a day (BID) | ORAL | Status: DC
  Administered 2015-06-21 (×2): 20 meq via ORAL
  Filled 2015-06-21 (×2): qty 1

## 2015-06-21 MED ORDER — ACETAMINOPHEN 325 MG PO TABS
650.0000 mg | ORAL_TABLET | ORAL | Status: DC | PRN
  Administered 2015-06-21: 650 mg via ORAL
  Filled 2015-06-21: qty 2

## 2015-06-21 MED ORDER — WARFARIN SODIUM 2.5 MG PO TABS: 2.5000 mg | ORAL_TABLET | Freq: Every day | ORAL | Status: DC

## 2015-06-21 MED ORDER — LACTULOSE 10 GM/15ML PO SOLN
20.0000 g | Freq: Every day | ORAL | Status: DC | PRN
  Administered 2015-06-21: 20 g via ORAL
  Filled 2015-06-21: qty 30

## 2015-06-21 MED ORDER — FUROSEMIDE 10 MG/ML IJ SOLN
40.0000 mg | Freq: Once | INTRAMUSCULAR | Status: AC
  Administered 2015-06-21: 40 mg via INTRAVENOUS
  Filled 2015-06-21: qty 4

## 2015-06-21 MED ORDER — WARFARIN SODIUM 2.5 MG PO TABS
2.5000 mg | ORAL_TABLET | Freq: Every day | ORAL | Status: DC
  Administered 2015-06-21 – 2015-06-23 (×3): 2.5 mg via ORAL
  Filled 2015-06-21 (×3): qty 1

## 2015-06-21 NOTE — Progress Notes (Signed)
Patient ambulated in the hallway 340 feet with 3 L oxygen. Patient talked throughout entire walk. Appeared to be short of breath but patient stated she felt fine. Returned to room and sat in chair awaiting dinner tray. Pt resting with call bell within reach.  Will continue to monitor. Heart rate sinus rhythm in the 80-90's throughout ambulation. Payton Emerald, RN

## 2015-06-21 NOTE — Progress Notes (Signed)
Patient in sinus rhythm this morning. Researched and appears converted at around 20:45-2100 on 06/20/15. Pt resting with call bell within reach.  Will continue to monitor.

## 2015-06-21 NOTE — Discharge Summary (Signed)
Physician Discharge Summary  Patient ID: Lindsay Bonilla MRN: [id number] DOB/AGE: Aug 16, 1960 55 y.o.  Admit date: 06/15/2015 Discharge date: 06/23/2015  Admission Diagnoses:  Patient Active Problem List   Diagnosis Date Noted  . Aortic valve disease   . CAD (coronary artery disease), native coronary artery 08/29/2012  . TOBACCO USER 02/25/2009  . ASCENDING AORTIC ANEURYSM 02/25/2009  . DYSPNEA ON EXERTION 08/29/2008  . MIGRAINE HEADACHE 08/21/2008  . VALVULAR HEART DISEASE 08/21/2008  . BRONCHITIS 08/21/2008  . ASTHMA 08/21/2008  . COPD 08/21/2008  . ARTHRITIS 08/21/2008  . PALPITATIONS 08/21/2008   Discharge Diagnoses:   Patient Active Problem List   Diagnosis Date Noted  . Atrial flutter ([place]) 06/21/2015  . S/P aortic valve replacement 06/15/2015  . Aortic valve disease   . CAD (coronary artery disease), native coronary artery 08/29/2012  . TOBACCO USER 02/25/2009  . ASCENDING AORTIC ANEURYSM 02/25/2009  . DYSPNEA ON EXERTION 08/29/2008  . MIGRAINE HEADACHE 08/21/2008  . VALVULAR HEART DISEASE 08/21/2008  . BRONCHITIS 08/21/2008  . ASTHMA 08/21/2008  . COPD 08/21/2008  . ARTHRITIS 08/21/2008  . PALPITATIONS 08/21/2008   Discharged Condition: good  History of Present Illness:  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  She has been routinely followed by Cardiology since that time.  The patient underwent evaluation for a knee replacement in 2015 at which time she was found to have preserved LV function, moderate AI, and a dilated Aortic Root.  CTA done at that time showed the root be measure 3.8 x 4.7 cm.  Most recent Echocardiogram performed in February showed severe AI, continued preservation of her EF.  However her Aortic root measured at 4.8cm.  Due to the progression of her AI it was  felt surgical intervention would be indicated.  Cardiac catheterization was done and showed no significant CAD.  She was referred to Dr. Cyndia Bonilla at [facility] and was evaluated on 05/15/2015.  At that visit the patient admitted to experiencing daily shortness of breath with exertion.  This is chronic for the patient.  There were also some occasional episodes of chest pain associated with this but they patient stated they were rare.  Dr. Cyndia Bonilla felt the patient would require intervention on her dilated Aortic Root and Aortic Valve.  Due to her severe COPD and poor PFTs, it was felt the patient should undergo repeat PFTs test prior to surgery and once her cold had resolved.  The risks and benefits of the procedure were explained to the patient and she wanted to think about surgery prior to proceeding.  She was instructed to contact our office once she wished to proceed.  Hospital Course:   [name] presented to [facility] hospital on 06/15/2015.  She was taken to the operating room and underwent  Redo Sternotomy with Replacement of Ascending Aortic Aneurysm utilizing a 28 mm Hemashield graft under hypothermic circulatory arrest, and Bentall procedure with a 23 mm St. Jude Mechanical Valve.  She tolerated the procedure without difficulty and was taken to the SICU in stable condition.  She was extubated the evening of surgery.  During her stay in the SICU the patient was treated aggressively with respiratory meds and IS.  Her chest tubes and arterial lines were removed without difficulty.  She was started on Coumadin for her mechanical valve.  She  developed a headache which was similar in nature to her chronic migraine headaches.  She was treated with her home regimen of Imitrex.  She developed HTN and was started on Lisinopril and Norvasc.  She was not placed on a beta blocker due to severe COPD.  She was maintaining NSR and felt medically stable for transfer to the step down unit on POD #3.  The patient continued to  progress.  Her INR rose very quickly and her dosage had to be held.  Once INR plateaued she was restarted on Coumadin at 2.5 mg daily.  Her INR currently is 2.03 with a goal range of 2.0-3.0.  She developed Atrial Flutter with atrial rate of 300.  She was treated with IV Cardizem and Digoxin.  After load was completed she was transitioned to an oral regimen of Digoxin.  She converted to NSR on POD #5.  She developed worsening shortness of breath.  Her nebulizer regimen was adjusted.  CXR was obtained and was free from significant pleural effusion.  She was given IV Lasix which resulted with U/O of 2L overnight and overall improvement of patient's respiratory status.  She was placed on oral regimen of Lasix to be tapered over discharge.  She was placed on oral Cardizem on POD #6.  She is ambulating independently.  She continues to maintain NSR.  Her respiratory status is at baseline.  She is felt medically stable for discharge home today.    Significant Diagnostic Studies: ECHO:  Left ventricle: The cavity size was normal. There was mild focal  basal hypertrophy of the septum. Systolic function was normal.  The estimated ejection fraction was in the range of 55% to 60%.  Wall motion was normal; there were no regional wall motion  abnormalities. Doppler parameters are consistent with abnormal  left ventricular relaxation (grade 1 diastolic dysfunction). The  E/e&' ratio is <8, suggesting normal LV filling pressure. - Aortic valve: Reported pulmonic valve homograft. Trileaflet;  mildly calcified leaflets. There was no stenosis. There was  moderate to severe regurgitation. Valve area (VTI): 2.67 cm^2.  Valve area (Vmax): 2.71 cm^2. Valve area (Vmean): 2.6 cm^2. - Aortic root: The aortic root is dilated to 4.8 cm. - Ascending aorta: The ascending aorta is dilated to 4.5 cm. - Mitral valve: Mildly thickened leaflets . There was mild  regurgitation. - Atrial septum: No defect or patent foramen  ovale was identified. - Tricuspid valve: There was trivial regurgitation. - Pulmonic valve: Poorly visualized. There was mild regurgitation.  Impressions:  - Compared to the prior study in 09/2014, there is more likely  severe AI with a further dilated aortic root to 4.8 cm and  ascending aorta to 4.5 cm. LVEF is higher at 55-60%.  Treatments: surgery:   1. Redo Median Sternotomy 2. Extracorporeal circulation 3. Replacement of ascending aortic aneurysm using a 28 mm Hemashield graft under deep hypothermic circulatory arrest 4. Bentall procedure using a 23 mm St. Jude Mechanical valved graft  Disposition: 01-Home or Self Care   Discharge Medications:  The patient has been discharged on:   1.Beta Blocker:  Yes [   ]                              No   [ x  ]  If No, reason: Severe COPD  2.Ace Inhibitor/ARB: Yes [ x  ]                                     No  [    ]                                     If No, reason:  3.Statin:   Yes [   ]                  No  [ x  ]                  If No, reason:  4.Ecasa:  Yes  [x   ]                  No   [   ]                  If No, reason:        Medication List    STOP taking these medications        cyclobenzaprine 10 MG tablet  Commonly known as:  FLEXERIL     furosemide 40 MG tablet  Commonly known as:  LASIX     meloxicam 15 MG tablet  Commonly known as:  MOBIC     OPANA 10 MG tablet  Generic drug:  oxymorphone     oxyCODONE-acetaminophen 10-325 MG tablet  Commonly known as:  PERCOCET     potassium chloride 10 MEQ tablet  Commonly known as:  K-DUR      TAKE these medications        ALPRAZolam 1 MG tablet  Commonly known as:  XANAX  Take 1 mg by mouth 4 (four) times daily as needed. For anxiety     budesonide-formoterol 160-4.5 MCG/ACT inhaler  Commonly known as:  SYMBICORT  Inhale 2 puffs into the lungs 2 (two) times daily.     buPROPion 150 MG 12 hr tablet   Commonly known as:  WELLBUTRIN SR  Take 150 mg by mouth 2 (two) times daily.     cetirizine 10 MG tablet  Commonly known as:  ZYRTEC  Take 10 mg by mouth every morning.     digoxin 0.25 MG tablet  Commonly known as:  LANOXIN  Take 1 tablet (0.25 mg total) by mouth daily.     diltiazem 240 MG 24 hr capsule  Commonly known as:  CARDIZEM CD  Take 1 capsule (240 mg total) by mouth daily.     docusate sodium 100 MG capsule  Commonly known as:  COLACE  Take 100 mg by mouth 2 (two) times daily as needed for mild constipation.     EPIPEN 0.3 mg/0.3 mL Devi  Generic drug:  EPINEPHrine  Inject 0.3 mg into the muscle as needed. For allergic reaction     montelukast 10 MG tablet  Commonly known as:  SINGULAIR  Take 10 mg by mouth at bedtime.     morphine 30 MG 12 hr tablet  Commonly known as:  MS CONTIN  Take 1 tablet (30 mg total) by mouth every 12 (twelve) hours.     omeprazole 20 MG capsule  Commonly known as:  PRILOSEC  Take 20 mg by mouth 2 (two) times daily  before a meal.     OVER THE COUNTER MEDICATION  Place 2 sprays into both nostrils daily as needed (for allergies). Clarispray     oxyCODONE 5 MG immediate release tablet  Commonly known as:  Oxy IR/ROXICODONE  Take 1-2 tablets (5-10 mg total) by mouth every 6 (six) hours as needed for severe pain.     PATADAY 0.2 % Soln  Generic drug:  Olopatadine HCl  Apply 1 drop to eye as needed.     PRESCRIPTION MEDICATION  Inject as directed every 30 (thirty) days. Allergy Injections     sertraline 100 MG tablet  Commonly known as:  ZOLOFT  Take 100 mg by mouth daily.     simvastatin 40 MG tablet  Commonly known as:  ZOCOR  TAKE 1 TABLET BY MOUTH AT BEDTIME.     SPIRIVA RESPIMAT 1.25 MCG/ACT Aers  Generic drug:  Tiotropium Bromide Monohydrate  Inhale 2 sprays into the lungs daily.     SUMAtriptan 100 MG tablet  Commonly known as:  IMITREX  Take 100 mg by mouth as needed. For migraines     albuterol (2.5 MG/3ML)  0.083% nebulizer solution  Commonly known as:  PROVENTIL  Take 2.5 mg by nebulization every 6 (six) hours as needed for wheezing or shortness of breath.     VENTOLIN HFA 108 (90 Base) MCG/ACT inhaler  Generic drug:  albuterol  Inhale 1 puff into the lungs 4 (four) times daily as needed. For shortness of breath     warfarin 2.5 MG tablet  Commonly known as:  COUMADIN  Take 1 tablet (2.5 mg total) by mouth daily at 6 PM. As directed through the coumadin clinic       Follow-up Information    Follow up with Gaye Pollack, MD In 4 weeks.   Specialty:  Cardiothoracic Surgery   Why:  Office will contact you wtih appointment date and time   Contact information:   [place] [address] [phone]       Follow up with [facility] In 4 weeks.   Why:  Please get CXR 30 min prior to your appointment with Dr. Lyn Henri information:   [facility]       Follow up with Lauree Chandler, MD.   Specialty:  Cardiology   Why:  Office will contact you with appointment date and time   Contact information:   [place] [place] [address] [phone]       Follow up with [facility].   Specialty:  Cardiology   Why:  Will need to have PT/INR checked 48 hours after discharge, please contact office if you have not received an appointment   Contact information:   [address] [place] [phone]      Signed: John Giovanni 06/23/2015, 4:14 PM

## 2015-06-21 NOTE — Progress Notes (Addendum)
      [address]       [place] [address]             [phone]      6 Days Post-Op Procedure(s) (LRB): REDO STERNOTOMY (N/A) BENTALL PROCEDURE; HEMI-ARCH REPAIR WITH #23 ST JUDE MECHANICAL AVR CONDUIT AND #28 HEMASHIELD PLATINUM GRAFT (N/A) TRANSESOPHAGEAL ECHOCARDIOGRAM (TEE) (N/A)   Subjective:  [name] states she is feeling better this morning.  Denies shortness of breath and states after Lasix yesterday this really improved.  Objective: Vital signs in last 24 hours: Temp:  [97.8 F (36.6 C)-98.4 F (36.9 C)] 98.4 F (36.9 C) (04/23 0518) Pulse Rate:  [79-96] 79 (04/23 0518) Cardiac Rhythm:  [-] Normal sinus rhythm (04/23 0702) Resp:  [17-18] 18 (04/23 0518) BP: (120-137)/(70-90) 137/90 mmHg (04/23 0518) SpO2:  [97 %-100 %] 100 % (04/23 0815) Weight:  [174 lb 11.2 oz (79.243 kg)] 174 lb 11.2 oz (79.243 kg) (04/23 0518)  Intake/Output from previous day: 04/22 0701 - 04/23 0700 In: 180 [P.O.:180] Out: 2000 [Urine:2000]  General appearance: alert, cooperative and no distress Heart: regular rate and rhythm Lungs: clear to auscultation bilaterally Abdomen: soft, non-tender; bowel sounds normal; no masses,  no organomegaly Extremities: edema trace Wound: clean and dry  Lab Results:  Recent Labs  06/21/15 0244  WBC 8.6  HGB 8.7*  HCT 27.3*  PLT 288   BMET:  Recent Labs  06/21/15 0244  NA 135  K 4.0  CL 94*  CO2 32  GLUCOSE 125*  BUN 10  CREATININE 0.60  CALCIUM 8.6*    PT/INR:  Recent Labs  06/21/15 0244  LABPROT 20.5*  INR 1.76*   ABG    Component Value Date/Time   PHART 7.461* 06/21/2015 0445   HCO3 32.3* 06/21/2015 0445   TCO2 33.8 06/21/2015 0445   ACIDBASEDEF 5.0* 06/15/2015 2245   O2SAT 94.8 06/21/2015 0445   CBG (last 3)  No results for input(s): GLUCAP in the last 72 hours.  Assessment/Plan: S/P Procedure(s) (LRB): REDO STERNOTOMY (N/A) BENTALL PROCEDURE; HEMI-ARCH REPAIR WITH #23 ST JUDE MECHANICAL AVR  CONDUIT AND #28 HEMASHIELD PLATINUM GRAFT (N/A) TRANSESOPHAGEAL ECHOCARDIOGRAM (TEE) (N/A)  1. CV- A. Flutter, currently in NSR- will d/c Cardizem drip- continue Digoxin, and oral cardizem 2. Pulm- severe COPD, on oxygen at home, continue Xopenex nebs 3. INR 1.76, will continue coumadin at 2.5 mg daily 4. Renal- creatinine stable, 2L U/O yesterday, will repeat IV Lasix today 5. Dispo- patient doing better today, maintaining NSR will d/c IV cardizem, continue Lasix, coumadin, if remains hemodynamically stable possibly ready for d/c in next 24-48 hours   LOS: 6 days    Bonilla, Lindsay 06/21/2015  I have seen and examined the patient and agree with the assessment and plan as outlined.  Doing much better today. Back in sinus rhythm.  Breathing much improved.  Rexene Alberts, MD 06/21/2015 11:44 AM

## 2015-06-22 LAB — BASIC METABOLIC PANEL
ANION GAP: 11 (ref 5–15)
BUN: 9 mg/dL (ref 6–20)
CALCIUM: 8.6 mg/dL — AB (ref 8.9–10.3)
CO2: 32 mmol/L (ref 22–32)
Chloride: 92 mmol/L — ABNORMAL LOW (ref 101–111)
Creatinine, Ser: 0.54 mg/dL (ref 0.44–1.00)
GFR calc Af Amer: >60 mL/min (ref >60)
Glucose, Bld: 116 mg/dL — ABNORMAL HIGH (ref 65–99)
POTASSIUM: 4.1 mmol/L (ref 3.5–5.1)
Sodium: 135 mmol/L (ref 135–145)

## 2015-06-22 LAB — PROTIME-INR
INR: 1.86 — AB (ref 0.00–1.49)
Prothrombin Time: 21.3 seconds — ABNORMAL HIGH (ref 11.6–15.2)

## 2015-06-22 MED ORDER — POTASSIUM CHLORIDE CRYS ER 20 MEQ PO TBCR
20.0000 meq | EXTENDED_RELEASE_TABLET | Freq: Two times a day (BID) | ORAL | Status: AC
  Administered 2015-06-22 (×2): 20 meq via ORAL
  Filled 2015-06-22 (×2): qty 1

## 2015-06-22 MED ORDER — LACTULOSE 10 GM/15ML PO SOLN
20.0000 g | Freq: Once | ORAL | Status: AC
Start: 2015-06-22 — End: 2015-06-22
  Administered 2015-06-22: 20 g via ORAL
  Filled 2015-06-22: qty 30

## 2015-06-22 MED ORDER — FUROSEMIDE 40 MG PO TABS
40.0000 mg | ORAL_TABLET | Freq: Every day | ORAL | Status: AC
  Administered 2015-06-22 – 2015-06-23 (×2): 40 mg via ORAL
  Filled 2015-06-22 (×2): qty 1

## 2015-06-22 NOTE — Progress Notes (Signed)
7 Days Post-Op Procedure(s) (LRB): REDO STERNOTOMY (N/A) BENTALL PROCEDURE; HEMI-ARCH REPAIR WITH #23 ST JUDE MECHANICAL AVR CONDUIT AND #28 HEMASHIELD PLATINUM GRAFT (N/A) TRANSESOPHAGEAL ECHOCARDIOGRAM (TEE) (N/A) Subjective:  No BM yet. Ambulated well yesterday. Not short of breath.  Objective: Vital signs in last 24 hours: Temp:  [97.7 F (36.5 C)-98.3 F (36.8 C)] 97.7 F (36.5 C) (04/24 0610) Pulse Rate:  [73-87] 87 (04/24 0610) Cardiac Rhythm:  [-] Normal sinus rhythm (04/23 1900) Resp:  [16-18] 18 (04/24 0635) BP: (126-155)/(61-92) 128/74 mmHg (04/24 0635) SpO2:  [95 %-100 %] 99 % (04/24 0635) Weight:  [79.289 kg (174 lb 12.8 oz)] 79.289 kg (174 lb 12.8 oz) (04/24 0610)  Hemodynamic parameters for last 24 hours:    Intake/Output from previous day: 04/23 0701 - 04/24 0700 In: 120 [P.O.:120] Out: -  Intake/Output this shift:    General appearance: alert and cooperative Neurologic: intact Heart: regular rate and rhythm and crisp mechanical valve click Lungs: clear to auscultation bilaterally Abdomen: soft, non-tender; bowel sounds normal; no masses,  no organomegaly Extremities: extremities normal, atraumatic, no cyanosis or edema Wound: incision ok  Lab Results:  Recent Labs  06/21/15 0244  WBC 8.6  HGB 8.7*  HCT 27.3*  PLT 288   BMET:  Recent Labs  06/21/15 0244 06/22/15 0240  NA 135 135  K 4.0 4.1  CL 94* 92*  CO2 32 32  GLUCOSE 125* 116*  BUN 10 9  CREATININE 0.60 0.54  CALCIUM 8.6* 8.6*    PT/INR:  Recent Labs  06/22/15 0240  LABPROT 21.3*  INR 1.86*   ABG    Component Value Date/Time   PHART 7.461* 06/21/2015 0445   HCO3 32.3* 06/21/2015 0445   TCO2 33.8 06/21/2015 0445   ACIDBASEDEF 5.0* 06/15/2015 2245   O2SAT 94.8 06/21/2015 0445   CBG (last 3)  No results for input(s): GLUCAP in the last 72 hours.  Assessment/Plan: S/P Procedure(s) (LRB): REDO STERNOTOMY (N/A) BENTALL PROCEDURE; HEMI-ARCH REPAIR WITH #23 ST JUDE  MECHANICAL AVR CONDUIT AND #28 HEMASHIELD PLATINUM GRAFT (N/A) TRANSESOPHAGEAL ECHOCARDIOGRAM (TEE) (N/A)  She is hemodynamically stable in sinus rhythm 70's on Cardizem CD 120 bid and digoxin 0.25 daily.  Volume excess: wt 5 lbs over preop. Continue oral diuretic.  Lactulose for constipation  INR up slightly to 1.86. Continue coumadin 2.5 daily.  Continue ambulation and IS. Plan home tomorrow if no changes and bowels working.   LOS: 7 days    Lindsay Bonilla 06/22/2015

## 2015-06-22 NOTE — Progress Notes (Signed)
CARDIAC REHAB PHASE I   PRE:  Rate/Rhythm: 80 SR    BP: sitting 132/80    SaO2: 98 3L  MODE:  Ambulation: 550 ft   POST:  Rate/Rhythm: 96 SR    BP: sitting 142/84     SaO2: 95 2L  Pt doing well. Able to get up and walk independently with RW. Would like RW for home. Used 2L and tolerated well. Return to EOB for wires to be pulled. Will f/u tomorrow. [place], ACSM 06/22/2015 9:46 AM

## 2015-06-22 NOTE — Progress Notes (Signed)
Pt given warm prune juice tonight. RN will continue to monitor.  Arnell Sieving, RN

## 2015-06-22 NOTE — Progress Notes (Signed)
Patient EPW pulled per protocol and as ordered, some resistance met but wires removed and all ends intact.  BP 115/98 heart rate 86. Patient reminded to lie supine approximately one hour. Will monitor patient. Maelle Sheaffer, Bettina Gavia RN

## 2015-06-22 NOTE — Progress Notes (Signed)
Patient still with complaints of not being able to have a BM, dulcolax given as ordered as needed for constipation. Aydan Levitz, Bettina Gavia RN

## 2015-06-23 LAB — PROTIME-INR
INR: 2.03 — ABNORMAL HIGH (ref 0.00–1.49)
PROTHROMBIN TIME: 22.9 s — AB (ref 11.6–15.2)

## 2015-06-23 MED ORDER — DILTIAZEM HCL ER COATED BEADS 240 MG PO CP24: 240.0000 mg | ORAL_CAPSULE | Freq: Every day | ORAL | Status: DC

## 2015-06-23 MED ORDER — DIGOXIN 250 MCG PO TABS: 0.2500 mg | ORAL_TABLET | Freq: Every day | ORAL | Status: DC

## 2015-06-23 MED ORDER — DILTIAZEM HCL ER COATED BEADS 240 MG PO CP24: 120.0000 mg | ORAL_CAPSULE | Freq: Every day | ORAL | Status: DC

## 2015-06-23 MED ORDER — WARFARIN SODIUM 2.5 MG PO TABS: 2.5000 mg | ORAL_TABLET | Freq: Every day | ORAL | Status: DC

## 2015-06-23 MED ORDER — FLEET ENEMA 7-19 GM/118ML RE ENEM
1.0000 | ENEMA | Freq: Once | RECTAL | Status: AC
  Administered 2015-06-23: 1 via RECTAL
  Filled 2015-06-23: qty 1

## 2015-06-23 MED ORDER — OXYCODONE HCL 5 MG PO TABS: 5.0000 mg | ORAL_TABLET | Freq: Four times a day (QID) | ORAL | Status: DC | PRN

## 2015-06-23 MED ORDER — MORPHINE SULFATE ER 30 MG PO TBCR: 30.0000 mg | EXTENDED_RELEASE_TABLET | Freq: Two times a day (BID) | ORAL | Status: DC

## 2015-06-23 NOTE — Care Management Note (Addendum)
ED CM received call from [facility] on 2W concerning patient being discharged needing O2 tank for transport. Patient reports that daughter will not be able to bring tank in from home. Patient was made aware CM can contact DME company to deliver a tank to the room, due to after hour call delivery may take up to a couple of hours. Patient was informed and stated that she is not willing to wait for a O2 tank to be delivered. Despite patient requiring continuous O2.

## 2015-06-23 NOTE — Progress Notes (Signed)
Patient given miralax as ordered as needed for constipation. Will monitor patient. Sarp Vernier, Bettina Gavia RN

## 2015-06-23 NOTE — Progress Notes (Signed)
CARDIAC REHAB PHASE I   PRE:  Rate/Rhythm: 84 SR  BP:  Supine:   Sitting: 121/65  Standing:    SaO2: 99% 2L  MODE:  Ambulation: 700 ft   POST:  Rate/Rhythm: 95 SR  BP:  Supine:   Sitting: 137/77  Standing:    SaO2: 85 %RA  93-95% 2L  SATURATION QUALIFICATIONS: (This note is used to comply with regulatory documentation for home oxygen)  Patient Saturations on Room Air at Rest = 99%  Patient Saturations on Room Air while Ambulating = 85%  Patient Saturations on 2 Liters of oxygen while Ambulating = 95% 1005-1100 Pt walked 700 ft with sats being monitored whole time. Pt dropped to low of 85% after walking about 500 ft. Put on 2L and sats to 93-95%. Pt walked total of 700 ft independently with rolling walker. Would recommend home oxygen and walker for home use. To recliner after walk and left on 2L. Pt with some SOB during walk. Education completed with pt. Discussed CRP 2 and will refer to [facility] program. Encouraged pt to watch sodium and Vitamin K. Put on Coumadin video for pt to view.      Please briefly explain why patient needs home oxygen: desat on RA with activity  Graylon Good, RN BSN  06/23/2015 10:54 AM

## 2015-06-23 NOTE — Progress Notes (Signed)
Patient with complaints of constipation, dulcolax suppository given as ordered as needed for constipation this AM. Shalynn Jorstad, Bettina Gavia RN

## 2015-06-23 NOTE — Progress Notes (Signed)
CT sutures removed as ordered steri strips applied. Will monitor patient. Theophilus Walz, Bettina Gavia RN

## 2015-06-23 NOTE — Progress Notes (Signed)
Patient and family given discharge instructions,AVS,  medication list and paper prescriptions, all questions were answered. Patient declined to  wait for oxygen tank to be provided by Case management at this time, Patient also did not want to wait for daughter to go to [facility] and get her tank and come back and get her, patient advised the importance of wearing oxygen as prescribed,  Patient stated she would be "ok" to travel home, and would call when she arrived at her destination. Will discharge home as ordered. Nancee Brownrigg, Bettina Gavia RN

## 2015-06-23 NOTE — Progress Notes (Signed)
PA on floor and made aware of patient still with constipation, Fleets enema ordered, and given as ordered. Patient with some result of BM after enema will monitor patient. Deiondre Harrower, Bettina Gavia RN

## 2015-06-23 NOTE — Progress Notes (Addendum)
[address]       [place] [address]             [phone]      8 Days Post-Op Procedure(s) (LRB): REDO STERNOTOMY (N/A) BENTALL PROCEDURE; HEMI-ARCH REPAIR WITH #23 ST JUDE MECHANICAL AVR CONDUIT AND #28 HEMASHIELD PLATINUM GRAFT (N/A) TRANSESOPHAGEAL ECHOCARDIOGRAM (TEE) (N/A) Subjective: Feels well, still no BM  Objective: Vital signs in last 24 hours: Temp:  [97.8 F (36.6 C)-98.7 F (37.1 C)] 98 F (36.7 C) (04/25 0514) Pulse Rate:  [79-91] 80 (04/25 0514) Cardiac Rhythm:  [-] Normal sinus rhythm (04/25 0722) Resp:  [18-19] 19 (04/25 0514) BP: (110-141)/(68-98) 122/77 mmHg (04/25 0514) SpO2:  [94 %-100 %] 100 % (04/25 0514) Weight:  [173 lb (78.472 kg)] 173 lb (78.472 kg) (04/25 0514)  Hemodynamic parameters for last 24 hours:    Intake/Output from previous day: 04/24 0701 - 04/25 0700 In: 240 [P.O.:240] Out: -  Intake/Output this shift:    General appearance: alert, cooperative and no distress Heart: regular rate and rhythm and soft murmur, crisp click Lungs: clear to auscultation bilaterally Abdomen: benign Extremities: no edema Wound: healing well   Lab Results:  Recent Labs  06/21/15 0244  WBC 8.6  HGB 8.7*  HCT 27.3*  PLT 288   BMET:  Recent Labs  06/21/15 0244 06/22/15 0240  NA 135 135  K 4.0 4.1  CL 94* 92*  CO2 32 32  GLUCOSE 125* 116*  BUN 10 9  CREATININE 0.60 0.54  CALCIUM 8.6* 8.6*    PT/INR:  Recent Labs  06/23/15 0544  LABPROT 22.9*  INR 2.03*   ABG    Component Value Date/Time   PHART 7.461* 06/21/2015 0445   HCO3 32.3* 06/21/2015 0445   TCO2 33.8 06/21/2015 0445   ACIDBASEDEF 5.0* 06/15/2015 2245   O2SAT 94.8 06/21/2015 0445   CBG (last 3)  No results for input(s): GLUCAP in the last 72 hours.  Meds Scheduled Meds: . antiseptic oral rinse  7 mL Mouth Rinse BID  . atorvastatin  20 mg Oral q1800  . budesonide  0.25 mg Nebulization BID  . buPROPion  150 mg Oral BID  . digoxin   0.25 mg Oral Daily  . diltiazem  120 mg Oral BID  . docusate sodium  200 mg Oral Daily  . furosemide  40 mg Oral Daily  . levalbuterol  1.25 mg Nebulization TID  . morphine  30 mg Oral Q12H  . pantoprazole  40 mg Oral QAC breakfast  . sertraline  100 mg Oral Daily  . simethicone  80 mg Oral QID  . sodium chloride flush  3 mL Intravenous Q12H  . tiotropium  18 mcg Inhalation Daily  . warfarin  2.5 mg Oral q1800  . Warfarin - Physician Dosing Inpatient   Does not apply q1800   Continuous Infusions:  PRN Meds:.sodium chloride, acetaminophen, ALPRAZolam, bisacodyl **OR** bisacodyl, levalbuterol, ondansetron **OR** ondansetron (ZOFRAN) IV, oxyCODONE, polyethylene glycol, sodium chloride flush, SUMAtriptan  Xrays No results found.  Assessment/Plan: S/P Procedure(s) (LRB): REDO STERNOTOMY (N/A) BENTALL PROCEDURE; HEMI-ARCH REPAIR WITH #23 ST JUDE MECHANICAL AVR CONDUIT AND #28 HEMASHIELD PLATINUM GRAFT (N/A) TRANSESOPHAGEAL ECHOCARDIOGRAM (TEE) (N/A) doing well, poss d/c later if BM Home on 2.5 of coumadin  LOS: 8 days    GOLD,WAYNE E 06/23/2015   Chart reviewed, patient examined, agree with above. She looks great but no BM yet. She has received multiple laxatives, fruit, prune juice, coffee. Home as  soon as bowels move.

## 2015-06-24 NOTE — Care Management Note (Signed)
Case Management Note Previous CM note initiated by [facility] RN, CM  Patient Details  Name: Lindsay Bonilla MRN: [id number] Date of Birth: October 19, 1960  Subjective/Objective:  Pt's 2 adult sons are in room and indicate they will be available to stay with her during the day as needed and dtr will be available evenings and nights.  Pt OOB to chair, feels well except for headache.                   Action/Plan: Pt s/p AVR/Bentall procedure- plan to return home- 4/25- spoke with pt at bedside- pt states that she has home 02 with [facility] however she did not qualify for 02 and still needs to qualify for home 02 otherwise they have told her they will pick 02 up. Will ask bedside RN to do the qualifying 02 note to send to [facility], pt also states that she needs RW, and 3n1 for home- she also asks about nebulizer for home- will ask MD regarding nebulizer. CM to continue to follow.   Expected Discharge Date:    06/23/15              Expected Discharge Plan:  Home/Self Care  In-House Referral:     Discharge planning Services  CM Consult  Post Acute Care Choice:  Durable Medical Equipment Choice offered to:     DME Arranged:  3-N-1, Walker rolling, Nebulizer machine DME Agency:  [place]:    [place]:     Status of Service:  Completed, signed off  Medicare Important Message Given:    Date Medicare IM Given:    Medicare IM give by:    Date Additional Medicare IM Given:    Additional Medicare Important Message give by:     If discussed at [place] of Stay Meetings, dates discussed:    Additional Comments:  06/23/15- [id number]- received call from RN that pt would be discharging today- orders for DME will be placed for RW, 3n1, and nebulizer, - call made to [facility] with [facility] for DME needs- RW and 3n1 to be delivered to room prior to discharge.   Dawayne Patricia, RN 06/24/2015, 9:37 AM

## 2015-06-25 ENCOUNTER — Ambulatory Visit (INDEPENDENT_AMBULATORY_CARE_PROVIDER_SITE_OTHER): Payer: Medicaid Other | Admitting: *Deleted

## 2015-06-25 DIAGNOSIS — Z5181 Encounter for therapeutic drug level monitoring: Secondary | ICD-10-CM | POA: Diagnosis not present

## 2015-06-25 DIAGNOSIS — Z954 Presence of other heart-valve replacement: Secondary | ICD-10-CM | POA: Diagnosis not present

## 2015-06-25 DIAGNOSIS — I359 Nonrheumatic aortic valve disorder, unspecified: Secondary | ICD-10-CM

## 2015-06-25 DIAGNOSIS — I4892 Unspecified atrial flutter: Secondary | ICD-10-CM | POA: Diagnosis not present

## 2015-06-25 DIAGNOSIS — Z952 Presence of prosthetic heart valve: Secondary | ICD-10-CM

## 2015-06-25 LAB — POCT INR: INR: 2.8

## 2015-06-25 NOTE — Patient Instructions (Signed)

## 2015-07-01 ENCOUNTER — Other Ambulatory Visit: Payer: Self-pay | Admitting: *Deleted

## 2015-07-01 DIAGNOSIS — I712 Thoracic aortic aneurysm, without rupture, unspecified: Secondary | ICD-10-CM

## 2015-07-02 ENCOUNTER — Ambulatory Visit (INDEPENDENT_AMBULATORY_CARE_PROVIDER_SITE_OTHER): Payer: Medicaid Other | Admitting: *Deleted

## 2015-07-02 DIAGNOSIS — Z952 Presence of prosthetic heart valve: Secondary | ICD-10-CM

## 2015-07-02 DIAGNOSIS — I359 Nonrheumatic aortic valve disorder, unspecified: Secondary | ICD-10-CM

## 2015-07-02 DIAGNOSIS — Z954 Presence of other heart-valve replacement: Secondary | ICD-10-CM

## 2015-07-02 DIAGNOSIS — I4892 Unspecified atrial flutter: Secondary | ICD-10-CM | POA: Diagnosis not present

## 2015-07-02 DIAGNOSIS — Z5181 Encounter for therapeutic drug level monitoring: Secondary | ICD-10-CM

## 2015-07-02 LAB — POCT INR: INR: 1.2

## 2015-07-06 ENCOUNTER — Telehealth: Payer: Self-pay | Admitting: Cardiovascular Disease

## 2015-07-06 NOTE — Telephone Encounter (Signed)
Agree. Thanks

## 2015-07-06 NOTE — Telephone Encounter (Signed)
New Message  Pt c/o fatigue and weakness since recent surgery. Pt c/o heart racing the last few days. Please call back and discuss.    Patient c/o Palpitations:  High priority if patient c/o lightheadedness and shortness of breath.  1. How long have you been having palpitations? 5/4, 5/5  2. Are you currently experiencing lightheadedness and shortness of breath? no  3. Have you checked your BP and heart rate? (document readings) n/a  4. Are you experiencing any other symptoms? [place], fatigue

## 2015-07-06 NOTE — Telephone Encounter (Signed)
Spoke with pt. She reports feeling more tired since Friday. Feels heart beat in her ear and can feel heart pounding when she places hand on her chest.  Reports she checks heart rate with pulse ox and earlier today was 102. This is the highest she is aware of. Usually 80-90's.  Currently is 97.  BP OK. Last reading was 126/70.  Takes Cardizem and Digoxin at night and took last night.  She is seeing Richardson Dopp, [place] on 07/10/15 and I asked her to continue to monitor heart rate and call us back if problems prior to this appt.  She is also seeing coumadin clinic tomorrow in [place] and I asked her to have them check her heart rate when there tomorrow.

## 2015-07-07 ENCOUNTER — Ambulatory Visit (INDEPENDENT_AMBULATORY_CARE_PROVIDER_SITE_OTHER): Payer: Medicaid Other | Admitting: *Deleted

## 2015-07-07 DIAGNOSIS — Z954 Presence of other heart-valve replacement: Secondary | ICD-10-CM

## 2015-07-07 DIAGNOSIS — I359 Nonrheumatic aortic valve disorder, unspecified: Secondary | ICD-10-CM

## 2015-07-07 DIAGNOSIS — Z5181 Encounter for therapeutic drug level monitoring: Secondary | ICD-10-CM

## 2015-07-07 DIAGNOSIS — I4892 Unspecified atrial flutter: Secondary | ICD-10-CM | POA: Diagnosis not present

## 2015-07-07 DIAGNOSIS — Z952 Presence of prosthetic heart valve: Secondary | ICD-10-CM

## 2015-07-07 LAB — POCT INR: INR: 1.8

## 2015-07-10 ENCOUNTER — Encounter: Payer: Medicaid Other | Admitting: Physician Assistant

## 2015-07-10 DIAGNOSIS — E785 Hyperlipidemia, unspecified: Secondary | ICD-10-CM | POA: Insufficient documentation

## 2015-07-10 NOTE — Progress Notes (Signed)
Cardiology Office Note:    Date:  07/10/2015   ID:  Lindsay Bonilla, DOB 05/27/1960, MRN [id number]  PCP:  [name], MD  Cardiologist:  Dr. Lauree Chandler   Electrophysiologist:  n/a  Referring MD: [name], MD   Chief Complaint  Patient presents with  . Hospitalization Follow-up    s/p Bentall/mechanical AVR    History of Present Illness:     Lindsay Bonilla is a 55 y.o. female with a hx of bicuspid aortic valve s/p Ross procedure in 1998, thoracic aortic aneurysm, COPD - O2 dependent, GERD, anxiety, tobacco abuse.  FU echo was planned in 2/17 when last seen by Dr. [name].  Echo showed severe AI with dilated aortic root at 4.8 cm.  She was seen by Dr. Cyndia Bent and surgical intervention was recommended.  LHC demonstrated no significant CAD.    She was admitted 4/17-4/25.  She underwent Bentall procedure with 28 mm Hemashield graft and St Jude mechanical AVR.  Postoperative course was compensated by atrial flutter with RVR. She converted to NSR on diltiazem and digoxin.  Beta blocker was avoided due to COPD. Coumadin was initiated for prevention of thromboembolism given mechanical aortic valve.   Returns for FU.     Past Medical History  Diagnosis Date  . Palpitations   . 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 Aortic stenosis, severe     Bicuspid valve  . Valvular heart disease   . Migraine headache   . Bronchitis   . COPD (chronic obstructive pulmonary disease) ([place])   . CHF (congestive heart failure) ([place])   . Asthma   . Aortic aneurysm ([place])   . Dysrhythmia     palpitations  . Anxiety   . GERD (gastroesophageal reflux disease)   . Arthritis   . Shortness of breath     Nodule left lung CT done 8/6  . PONV (postoperative nausea and vomiting)     "only once"  . DDD (degenerative disc disease), cervical   . Hard of hearing   . History of pneumonia   . Depression   . Urinary frequency     due to Lasix  . Stress  incontinence     Past Surgical History  Procedure Laterality Date  . Sigmoidoscopy  10/13/05, 09/19/05  . Colonoscopy  10/13/05  . Aortic valve replacement    . Cholecystectomy    . Abdominal hysterectomy    . Knee arthroscopy      rt x4  x1 lft  . Tubal ligation    . Arthroscopic repair acl  rt  . Diagnostic laparoscopy    . Tympanostomy tube placement    . Myringoplasty w/ fat graft      graft from behind ear  . Anterior cervical decomp/discectomy fusion  10/05/2011    Procedure: ANTERIOR CERVICAL DECOMPRESSION/DISCECTOMY FUSION 2 LEVELS;  Surgeon: Floyce Stakes, MD;  [facility];  Service: Neurosurgery;  Laterality: N/A;  Cervical five - six , six - seven Anterior cervical decompression/diskectomy/fusion/plate  . Cardiac catheterization N/A 05/21/2015    Procedure: Right/Left Heart Cath and Coronary Angiography;  Surgeon: [name], MD;  Location: [facility];  Service: Cardiovascular;  Laterality: N/A;  . Joint replacement Right 2016  . Anterior cruciate ligament repair Right   . Tonsillectomy  2005  . Bentall procedure N/A 06/15/2015    Procedure: BENTALL PROCEDURE; HEMI-ARCH REPAIR WITH #23 ST JUDE MECHANICAL AVR CONDUIT AND #28 HEMASHIELD  PLATINUM GRAFT;  Surgeon: Gaye Pollack, MD;  Location: [facility] [place];  Service: Open Heart Surgery;  Laterality: N/A;  . Tee without cardioversion N/A 06/15/2015    Procedure: TRANSESOPHAGEAL ECHOCARDIOGRAM (TEE);  Surgeon: Gaye Pollack, MD;  Location: [facility];  Service: Open Heart Surgery;  Laterality: N/A;    Current Medications: Outpatient Prescriptions Prior to Visit  Medication Sig Dispense Refill  . albuterol (PROVENTIL) (2.5 MG/3ML) 0.083% nebulizer solution Take 2.5 mg by nebulization every 6 (six) hours as needed for wheezing or shortness of breath.    Marland Kitchen albuterol (VENTOLIN HFA) 108 (90 BASE) MCG/ACT inhaler Inhale 1 puff into the lungs 4 (four) times daily as needed. For shortness of breath    . ALPRAZolam  (XANAX) 1 MG tablet Take 1 mg by mouth 4 (four) times daily as needed. For anxiety    . budesonide-formoterol (SYMBICORT) 160-4.5 MCG/ACT inhaler Inhale 2 puffs into the lungs 2 (two) times daily.    Marland Kitchen buPROPion (WELLBUTRIN SR) 150 MG 12 hr tablet Take 150 mg by mouth 2 (two) times daily.    . cetirizine (ZYRTEC) 10 MG tablet Take 10 mg by mouth every morning.     . digoxin (LANOXIN) 0.25 MG tablet Take 1 tablet (0.25 mg total) by mouth daily. 30 tablet 1  . diltiazem (CARDIZEM CD) 240 MG 24 hr capsule Take 1 capsule (240 mg total) by mouth daily. 30 capsule 1  . docusate sodium (COLACE) 100 MG capsule Take 100 mg by mouth 2 (two) times daily as needed for mild constipation.    Marland Kitchen EPINEPHrine (EPIPEN) 0.3 mg/0.3 mL DEVI Inject 0.3 mg into the muscle as needed. For allergic reaction    . montelukast (SINGULAIR) 10 MG tablet Take 10 mg by mouth at bedtime.     Marland Kitchen morphine (MS CONTIN) 30 MG 12 hr tablet Take 1 tablet (30 mg total) by mouth every 12 (twelve) hours. 30 tablet 0  . Olopatadine HCl (PATADAY) 0.2 % SOLN Apply 1 drop to eye as needed.    Marland Kitchen omeprazole (PRILOSEC) 20 MG capsule Take 20 mg by mouth 2 (two) times daily before a meal.    . OVER THE COUNTER MEDICATION Place 2 sprays into both nostrils daily as needed (for allergies). Clarispray    . oxyCODONE (OXY IR/ROXICODONE) 5 MG immediate release tablet Take 1-2 tablets (5-10 mg total) by mouth every 6 (six) hours as needed for severe pain. 40 tablet 0  . PRESCRIPTION MEDICATION Inject as directed every 30 (thirty) days. Allergy Injections    . sertraline (ZOLOFT) 100 MG tablet Take 100 mg by mouth daily.    . simvastatin (ZOCOR) 40 MG tablet TAKE 1 TABLET BY MOUTH AT BEDTIME. (Patient taking differently: TAKE 40 MG BY MOUTH AT BEDTIME.) 30 tablet 9  . SUMAtriptan (IMITREX) 100 MG tablet Take 100 mg by mouth as needed. For migraines    . Tiotropium Bromide Monohydrate (SPIRIVA RESPIMAT) 1.25 MCG/ACT AERS Inhale 2 sprays into the lungs daily.      Marland Kitchen warfarin (COUMADIN) 2.5 MG tablet Take 1 tablet (2.5 mg total) by mouth daily at 6 PM. As directed through the coumadin clinic 100 tablet 1   No facility-administered medications prior to visit.      Allergies:   Beta adrenergic blockers; Peanut-containing drug products; Shrimp; Vancomycin; Avelox; Ciprofloxacin; Cleocin; Moxifloxacin; Sulfamethoxazole-trimethoprim; and Tape   Social History   Social History  . Marital Status: Legally Separated    Spouse Name: N/A  . Number of Children: N/A  .  Years of Education: N/A   Social History Main Topics  . Smoking status: Former Smoker -- 0.10 packs/day for 30 years    Types: Cigarettes    Quit date: 04/29/2015  . Smokeless tobacco: Never Used  . Alcohol Use: No  . Drug Use: No  . Sexual Activity: No   Other Topics Concern  . Not on file   Social History Narrative     Family History:  The patient's family history includes Asthma in her brother; Cancer in her mother; Heart attack in her father.   ROS:   Please see the history of present illness.    ROS All other systems reviewed and are negative.   Physical Exam:    VS:  There were no vitals taken for this visit.   GEN: Well nourished, well developed, in no acute distress HEENT: normal Neck: no JVD, no masses Cardiac: Normal S1/S2, RRR; no murmurs, rubs, or gallops, no edema;   carotid bruits,   Respiratory:  clear to auscultation bilaterally; no wheezing, rhonchi or rales GI: soft, nontender, nondistended MS: no deformity or atrophy Skin: warm and dry Neuro: No focal deficits  Psych: Alert and oriented x 3, normal affect  Wt Readings from Last 3 Encounters:  06/23/15 173 lb (78.472 kg)  06/11/15 169 lb 8 oz (76.885 kg)  05/21/15 164 lb (74.39 kg)      Studies/Labs Reviewed:     EKG:  EKG is  ordered today.  The ekg ordered today demonstrates   Recent Labs: 08/31/2014: B Natriuretic Peptide 57.0 06/16/2015: Magnesium 2.0 06/21/2015: ALT 57*; Hemoglobin 8.7*;  Platelets 288 06/22/2015: BUN 9; Creatinine, Ser 0.54; Potassium 4.1; Sodium 135   Recent Lipid Panel    Component Value Date/Time   CHOL 139 04/16/2015 1017   TRIG 133 04/16/2015 1017   HDL 45* 04/16/2015 1017   CHOLHDL 3.1 04/16/2015 1017   VLDL 27 04/16/2015 1017   LDLCALC 67 04/16/2015 1017   LDLDIRECT 79.0 03/25/2014 0947    Additional studies/ records that were reviewed today include:    Carotid US 4/17 Bilateral ICA 1-39%  LHC 3/17 LAD ok RI ok LCx ok RCA prox 20%  Holter 2/17 Premature ventricular contractions (218 during the monitoring period) Premature atrial contractions (300 during the monitoring period) Several consecutive PVCs (couplets) No atrial fibrillation  Echo 2/617 Mild focal basal septal hypertrophy, EF 55-60%, normal wall motion, grade 1 diastolic dysfunction, moderate to severe AI, dilated aortic root at 4.8 cm, dilated ascending aorta at 4.5 cm, mild MR, mild PI  ASSESSMENT:     1. S/P aortic valve replacement and aortoplasty   2. Atrial flutter, unspecified type ([place])   3. Coronary artery disease involving native coronary artery of native heart without angina pectoris   4. Chronic obstructive pulmonary disease, unspecified COPD type ([place])   5. Hyperlipidemia     PLAN:     In order of problems listed above:  1. S/p AVR - Hx of bicuspid AV and Ross procedure in 1998.  Now s/p Bentall with mechanical AVR 2/2 AI and dilated aortic root.  Continue Coumadin.  She should also be on ASA 81 mg QD.    -  Echo will be arranged   -  SBE prophylaxis discussed  -  FU with Coumadin Clinic in [place] op AFlutter treated with Dig and Diltiazem.    3. CAD - Minimal Plaque on LHC prior to surgery.  Continue ASA, statin.    4.  COPD -   5. HL - Continue Simva 40.     Medication Adjustments/Labs and Tests Ordered: Current medicines are reviewed at length with the patient today.  Concerns regarding medicines are outlined above.   Medication changes, Labs and Tests ordered today are outlined in the Patient Instructions noted below. There are no Patient Instructions on file for this visit. Signed, Richardson Dopp, PA-C  07/10/2015 8:15 AM    [facility] [address], [place], [place]  [address] Phone: [phone]; Fax: [phone]     This encounter was created in error - please disregard.

## 2015-07-14 ENCOUNTER — Ambulatory Visit (INDEPENDENT_AMBULATORY_CARE_PROVIDER_SITE_OTHER): Payer: Medicaid Other | Admitting: *Deleted

## 2015-07-14 DIAGNOSIS — I359 Nonrheumatic aortic valve disorder, unspecified: Secondary | ICD-10-CM | POA: Diagnosis not present

## 2015-07-14 DIAGNOSIS — I4892 Unspecified atrial flutter: Secondary | ICD-10-CM | POA: Diagnosis not present

## 2015-07-14 DIAGNOSIS — Z954 Presence of other heart-valve replacement: Secondary | ICD-10-CM | POA: Diagnosis not present

## 2015-07-14 DIAGNOSIS — Z952 Presence of prosthetic heart valve: Secondary | ICD-10-CM

## 2015-07-14 DIAGNOSIS — Z5181 Encounter for therapeutic drug level monitoring: Secondary | ICD-10-CM

## 2015-07-14 LAB — POCT INR: INR: 1.9

## 2015-07-14 MED ORDER — WARFARIN SODIUM 5 MG PO TABS: ORAL_TABLET | ORAL | Status: DC

## 2015-07-21 ENCOUNTER — Other Ambulatory Visit: Payer: Self-pay | Admitting: *Deleted

## 2015-07-21 ENCOUNTER — Ambulatory Visit (INDEPENDENT_AMBULATORY_CARE_PROVIDER_SITE_OTHER): Payer: Medicaid Other | Admitting: *Deleted

## 2015-07-21 DIAGNOSIS — Z952 Presence of prosthetic heart valve: Secondary | ICD-10-CM

## 2015-07-21 DIAGNOSIS — Z5181 Encounter for therapeutic drug level monitoring: Secondary | ICD-10-CM

## 2015-07-21 DIAGNOSIS — I4892 Unspecified atrial flutter: Secondary | ICD-10-CM

## 2015-07-21 DIAGNOSIS — Z954 Presence of other heart-valve replacement: Secondary | ICD-10-CM

## 2015-07-21 DIAGNOSIS — I359 Nonrheumatic aortic valve disorder, unspecified: Secondary | ICD-10-CM | POA: Diagnosis not present

## 2015-07-21 LAB — POCT INR: INR: 2.2

## 2015-07-22 ENCOUNTER — Ambulatory Visit (INDEPENDENT_AMBULATORY_CARE_PROVIDER_SITE_OTHER): Payer: Self-pay | Admitting: Surgery

## 2015-07-22 ENCOUNTER — Ambulatory Visit
Admission: RE | Admit: 2015-07-22 | Discharge: 2015-07-22 | Disposition: A | Discharge status: Home / Self Care | Payer: Medicaid Other | Source: Ambulatory Visit | Attending: Surgery | Admitting: Surgery

## 2015-07-22 ENCOUNTER — Encounter: Payer: Self-pay | Admitting: Surgery

## 2015-07-22 VITALS — BP 140/94 | HR 100 | Resp 20 | Ht 64.0 in | Wt 173.0 lb

## 2015-07-22 DIAGNOSIS — I712 Thoracic aortic aneurysm, without rupture: Secondary | ICD-10-CM

## 2015-07-22 DIAGNOSIS — I509 Heart failure, unspecified: Secondary | ICD-10-CM

## 2015-07-22 DIAGNOSIS — I351 Nonrheumatic aortic (valve) insufficiency: Secondary | ICD-10-CM

## 2015-07-22 DIAGNOSIS — Z952 Presence of prosthetic heart valve: Secondary | ICD-10-CM

## 2015-07-22 DIAGNOSIS — I7121 Aneurysm of the ascending aorta, without rupture: Secondary | ICD-10-CM

## 2015-07-22 NOTE — Progress Notes (Signed)
Cardiology Office Note:    Date:  07/23/2015   ID:  Lindsay Bonilla, DOB [birth year], MRN [id number]  PCP:  [name], MD  Cardiologist:  Dr. Lauree Chandler   Electrophysiologist:  n/a  Referring MD: [name], MD   Chief Complaint  Patient presents with  . Hospitalization Follow-up    s/p Bentall/mechanical AVR    History of Present Illness:     Lindsay Bonilla is a 55 y.o. female with a hx of bicuspid aortic valve s/p Ross procedure in 1998, thoracic aortic aneurysm, COPD - O2 dependent, GERD, anxiety, tobacco abuse. FU echo was planned in 2/17 when last seen by Dr. [name]. Echo showed severe AI with dilated aortic root at 4.8 cm. She was seen by Dr. Cyndia Bent and surgical intervention was recommended. LHC demonstrated no significant CAD.   She was admitted 4/17-4/25. She underwent Bentall procedure with 28 mm Hemashield graft and St Jude mechanical AVR. Postoperative course was compensated by atrial flutter with RVR. She converted to NSR on diltiazem and digoxin. Beta blocker was avoided due to COPD. Coumadin was initiated for prevention of thromboembolism given mechanical aortic valve.   Returns for FU. Here alone. Since discharge, she has been doing well. She has already seen Dr. Cyndia Bent. She would like to start cardiac rehabilitation. She does note that she previously was on Lasix twice a day. She has been having some increasing LE edema. Her weight will fluctuate 6 pounds at a time. She has been noticing that her blood pressure is elevated. She's had headaches associated with this. She sleeps on an incline chronically secondary to acid reflux. Denies PND. Chest remains sore. Denies fevers.   Past Medical History  Diagnosis Date  . Palpitations   . 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 Aortic stenosis, severe     Bicuspid valve  . Valvular heart disease   . Migraine headache   . Bronchitis   . COPD (chronic obstructive  pulmonary disease) ([place])   . CHF (congestive heart failure) ([place])   . Asthma   . Aortic aneurysm ([place])   . Dysrhythmia     palpitations  . Anxiety   . GERD (gastroesophageal reflux disease)   . Arthritis   . Shortness of breath     Nodule left lung CT done 8/6  . PONV (postoperative nausea and vomiting)     "only once"  . DDD (degenerative disc disease), cervical   . Hard of hearing   . History of pneumonia   . Depression   . Urinary frequency     due to Lasix  . Stress incontinence     Past Surgical History  Procedure Laterality Date  . Sigmoidoscopy  10/13/05, 09/19/05  . Colonoscopy  10/13/05  . Aortic valve replacement    . Cholecystectomy    . Abdominal hysterectomy    . Knee arthroscopy      rt x4  x1 lft  . Tubal ligation    . Arthroscopic repair acl  rt  . Diagnostic laparoscopy    . Tympanostomy tube placement    . Myringoplasty w/ fat graft      graft from behind ear  . Anterior cervical decomp/discectomy fusion  10/05/2011    Procedure: ANTERIOR CERVICAL DECOMPRESSION/DISCECTOMY FUSION 2 LEVELS;  Surgeon: Floyce Stakes, MD;  [facility]: [facility];  Service: Neurosurgery;  Laterality: N/A;  Cervical five - six , six - seven Anterior cervical decompression/diskectomy/fusion/plate  . Cardiac  catheterization N/A 05/21/2015    Procedure: Right/Left Heart Cath and Coronary Angiography;  Surgeon: [name], MD;  Location: [facility];  Service: Cardiovascular;  Laterality: N/A;  . Joint replacement Right 2016  . Anterior cruciate ligament repair Right   . Tonsillectomy  2005  . Bentall procedure N/A 06/15/2015    Procedure: BENTALL PROCEDURE; HEMI-ARCH REPAIR WITH #23 ST JUDE MECHANICAL AVR CONDUIT AND #28 HEMASHIELD PLATINUM GRAFT;  Surgeon: Gaye Pollack, MD;  Location: [facility];  Service: Open Heart Surgery;  Laterality: N/A;  . Tee without cardioversion N/A 06/15/2015    Procedure: TRANSESOPHAGEAL ECHOCARDIOGRAM (TEE);  Surgeon: Gaye Pollack, MD;   Location: [facility];  Service: Open Heart Surgery;  Laterality: N/A;    Current Medications: Outpatient Prescriptions Prior to Visit  Medication Sig Dispense Refill  . albuterol (PROVENTIL) (2.5 MG/3ML) 0.083% nebulizer solution Take 2.5 mg by nebulization every 6 (six) hours as needed for wheezing or shortness of breath.    Marland Kitchen albuterol (VENTOLIN HFA) 108 (90 BASE) MCG/ACT inhaler Inhale 1 puff into the lungs 4 (four) times daily as needed. For shortness of breath    . ALPRAZolam (XANAX) 1 MG tablet Take 1 mg by mouth 4 (four) times daily as needed. For anxiety    . budesonide-formoterol (SYMBICORT) 160-4.5 MCG/ACT inhaler Inhale 2 puffs into the lungs 2 (two) times daily.    Marland Kitchen buPROPion (WELLBUTRIN SR) 150 MG 12 hr tablet Take 150 mg by mouth 2 (two) times daily.    . cetirizine (ZYRTEC) 10 MG tablet Take 10 mg by mouth every morning.     . docusate sodium (COLACE) 100 MG capsule Take 100 mg by mouth 2 (two) times daily as needed for mild constipation.    Marland Kitchen EPINEPHrine (EPIPEN) 0.3 mg/0.3 mL DEVI Inject 0.3 mg into the muscle as needed. For allergic reaction    . montelukast (SINGULAIR) 10 MG tablet Take 10 mg by mouth at bedtime.     . Olopatadine HCl (PATADAY) 0.2 % SOLN Place 1 drop into both eyes as needed (FOR ALLERGIES).     Marland Kitchen omeprazole (PRILOSEC) 20 MG capsule Take 20 mg by mouth 2 (two) times daily before a meal.    . PRESCRIPTION MEDICATION Inject as directed every 30 (thirty) days. Allergy Injections    . sertraline (ZOLOFT) 100 MG tablet Take 100 mg by mouth daily.    . SUMAtriptan (IMITREX) 100 MG tablet Take 100 mg by mouth as needed. For migraines    . Tiotropium Bromide Monohydrate (SPIRIVA RESPIMAT) 1.25 MCG/ACT AERS Inhale 2 sprays into the lungs daily.     Marland Kitchen warfarin (COUMADIN) 5 MG tablet Take 1 tablet daily except 1/2 tablet on Sundays 45 tablet 3  . digoxin (LANOXIN) 0.25 MG tablet Take 1 tablet (0.25 mg total) by mouth daily. 30 tablet 1  . diltiazem (CARDIZEM CD) 240 MG  24 hr capsule Take 1 capsule (240 mg total) by mouth daily. 30 capsule 1  . morphine (MS CONTIN) 30 MG 12 hr tablet Take 1 tablet (30 mg total) by mouth every 12 (twelve) hours. (Patient not taking: Reported on 07/23/2015) 30 tablet 0  . OVER THE COUNTER MEDICATION Place 2 sprays into both nostrils daily as needed (for allergies). Reported on 07/23/2015    . oxyCODONE (OXY IR/ROXICODONE) 5 MG immediate release tablet Take 1-2 tablets (5-10 mg total) by mouth every 6 (six) hours as needed for severe pain. (Patient not taking: Reported on 07/23/2015) 40 tablet 0  .  simvastatin (ZOCOR) 40 MG tablet TAKE 1 TABLET BY MOUTH AT BEDTIME. (Patient not taking: Reported on 07/23/2015) 30 tablet 9   No facility-administered medications prior to visit.      Allergies:   Beta adrenergic blockers; Peanut-containing drug products; Shrimp; Vancomycin; Avelox; Ciprofloxacin; Cleocin; Moxifloxacin; Sulfamethoxazole-trimethoprim; and Tape   Social History   Social History  . Marital Status: Legally Separated    Spouse Name: N/A  . Number of Children: N/A  . Years of Education: N/A   Social History Main Topics  . Smoking status: Former Smoker -- 0.10 packs/day for 30 years    Types: Cigarettes    Quit date: 04/29/2015  . Smokeless tobacco: Never Used  . Alcohol Use: No  . Drug Use: No  . Sexual Activity: No   Other Topics Concern  . None   Social History Narrative     Family History:  The patient's family history includes Asthma in her brother; Cancer in her mother; Heart attack in her father.   ROS:   Please see the history of present illness.    Review of Systems  Constitution: Positive for decreased appetite.  HENT: Positive for headaches.   Respiratory: Positive for snoring.   Psychiatric/Behavioral: The patient is nervous/anxious.    All other systems reviewed and are negative.   Physical Exam:    VS:  BP 150/74 mmHg  Pulse 80  Ht 5\' 4"  (1.626 m)  Wt 163 lb (73.936 kg)  BMI 27.97  kg/m2   GEN: Well nourished, well developed, in no acute distress HEENT: normal Neck: no JVD, no masses Cardiac: Normal S1/Mechanical S2, RRR; no murmurs,  no edema;     Respiratory:  clear to auscultation bilaterally; no wheezing, rhonchi or rales GI: soft, nontender, nondistended MS: no deformity or atrophy Skin: warm and dry Neuro: No focal deficits  Psych: Alert and oriented x 3, normal affect  Wt Readings from Last 3 Encounters:  07/23/15 163 lb (73.936 kg)  07/22/15 173 lb (78.472 kg)  06/23/15 173 lb (78.472 kg)      Studies/Labs Reviewed:     EKG:  EKG is   ordered today.  The ekg ordered today demonstrates NSR, HR 80, normal axis, inf-lat TWI (Dig effect?), QTc 422 ms  Recent Labs: 08/31/2014: B Natriuretic Peptide 57.0 06/16/2015: Magnesium 2.0 06/21/2015: ALT 57*; Hemoglobin 8.7*; Platelets 288 06/22/2015: BUN 9; Creatinine, Ser 0.54; Potassium 4.1; Sodium 135   Recent Lipid Panel    Component Value Date/Time   CHOL 139 04/16/2015 1017   TRIG 133 04/16/2015 1017   HDL 45* 04/16/2015 1017   CHOLHDL 3.1 04/16/2015 1017   VLDL 27 04/16/2015 1017   LDLCALC 67 04/16/2015 1017   LDLDIRECT 79.0 03/25/2014 0947    Additional studies/ records that were reviewed today include:   Carotid US 4/17 Bilateral ICA 1-39%  LHC 3/17 LAD ok RI ok LCx ok RCA prox 20%  Holter 2/17 Premature ventricular contractions (218 during the monitoring period) Premature atrial contractions (300 during the monitoring period) Several consecutive PVCs (couplets) No atrial fibrillation  Echo 2/617 Mild focal basal septal hypertrophy, EF 55-60%, normal wall motion, grade 1 diastolic dysfunction, moderate to severe AI, dilated aortic root at 4.8 cm, dilated ascending aorta at 4.5 cm, mild MR, mild PI   ASSESSMENT:     1. S/P aortic valve replacement   2. Chronic diastolic CHF (congestive heart failure) ([place])   3. Atrial flutter, unspecified type ([place])   4. Coronary artery disease  involving  native coronary artery of native heart without angina pectoris   5. Hyperlipidemia   6. Essential hypertension     PLAN:     In order of problems listed above:  1. S/p AVR - Hx of bicuspid AV and Ross procedure in 1998. Now s/p Bentall with mechanical AVR 2/2 AI and dilated aortic root. She is progressing well.  Continue Coumadin. She should also be on ASA 81 mg QD.  - FU Echo will be arranged  - SBE prophylaxis   - FU with Coumadin Clinic in [facility] ASA 81 mg QD  -  Start [facility] at [facility]  2. Diastolic CHF - Volume stable.  She was previously on Lasix 40 bid.  Will start back on Lasix 40 QD and K+ 20 QD.  BMET 1 week.    3. AFlutter - Post op AFlutter treated with Dig and Diltiazem. Maintaining NSR.  With normal EF, will DC Dig.    4. CAD - Minimal Plaque on LHC prior to surgery. Continue ASA, statin.   5. HL - With dose of Diltiazem, will DC Simva.  Start Prava 40 mg QHS.  Lipids and LFTs 3 mos.   6. HTN - BP uncontrolled.  Add Lasix as noted.  Increase Cardizem CD to 360 mg QD   Medication Adjustments/Labs and Tests Ordered: Current medicines are reviewed at length with the patient today.  Concerns regarding medicines are outlined above.  Medication changes, Labs and Tests ordered today are outlined in the Patient Instructions noted below. Patient Instructions  Medication Instructions:  1. STOP DIGOXIN  2. STOP SIMVASTATIN 3. INCREASE CARDIZEM CD TO 360 MG DAILY; NEW RX 4. START POTASSIUM 20 MEQ DAILY; NEW RX  5. START ASPIRIN 81 MG DAILY 6. START LASIX 40 MG DAILY; RX SENT  7. START PRAVASTATIN 40 MG 1 TABLET AT BEDTIME; RX SENT Labwork: BMET TO BE DONE 1 WEEK Testing/Procedures: Your physician has requested that you have an echocardiogram.  THIS IS TO BE DONE AT [facility]. Echocardiography is a painless test that uses sound waves to create images of your heart. It provides your doctor with information about the  size and shape of your heart and how well your heart's chambers and valves are working. This procedure takes approximately one hour. There are no restrictions for this procedure. Follow-Up: DR. [name] IN 3 MONTHS  Any Other Special Instructions Will Be Listed Below (If Applicable). OK TO CALL CARDIAC REHAB TO GET STARTED If you need a refill on your cardiac medications before your next appointment, please call your pharmacy.    Signed, Richardson Dopp, PA-C  07/23/2015 1:50 PM    [facility] [place], [place], [place]  [address] Phone: [phone]; Fax: [phone]

## 2015-07-22 NOTE — Progress Notes (Signed)
HPI: Patient returns for routine postoperative follow-up having undergone redo sternotomy and replacement of the ascending aortic aneurysm and Bentall procedure using a [employer] mechanical valved graft on 06/15/2015. The patient's early postoperative recovery while in the hospital was notable for an uncomplicated postop course. Since hospital discharge the patient reports that she has been feeling fairly well. She is walking without shortness of breath or chest pain. Her only complaints are related to her chronic pain from spine disease. She continues to abstain from smoking.   Current Outpatient Prescriptions  Medication Sig Dispense Refill  . albuterol (PROVENTIL) (2.5 MG/3ML) 0.083% nebulizer solution Take 2.5 mg by nebulization every 6 (six) hours as needed for wheezing or shortness of breath.    Marland Kitchen albuterol (VENTOLIN HFA) 108 (90 BASE) MCG/ACT inhaler Inhale 1 puff into the lungs 4 (four) times daily as needed. For shortness of breath    . ALPRAZolam (XANAX) 1 MG tablet Take 1 mg by mouth 4 (four) times daily as needed. For anxiety    . budesonide-formoterol (SYMBICORT) 160-4.5 MCG/ACT inhaler Inhale 2 puffs into the lungs 2 (two) times daily.    Marland Kitchen buPROPion (WELLBUTRIN SR) 150 MG 12 hr tablet Take 150 mg by mouth 2 (two) times daily.    . cetirizine (ZYRTEC) 10 MG tablet Take 10 mg by mouth every morning.     . digoxin (LANOXIN) 0.25 MG tablet Take 1 tablet (0.25 mg total) by mouth daily. 30 tablet 1  . diltiazem (CARDIZEM CD) 240 MG 24 hr capsule Take 1 capsule (240 mg total) by mouth daily. 30 capsule 1  . docusate sodium (COLACE) 100 MG capsule Take 100 mg by mouth 2 (two) times daily as needed for mild constipation.    Marland Kitchen EPINEPHrine (EPIPEN) 0.3 mg/0.3 mL DEVI Inject 0.3 mg into the muscle as needed. For allergic reaction    . montelukast (SINGULAIR) 10 MG tablet Take 10 mg by mouth at bedtime.     Marland Kitchen morphine (MS CONTIN) 30 MG 12 hr tablet Take 1 tablet (30 mg total) by mouth  every 12 (twelve) hours. 30 tablet 0  . Olopatadine HCl (PATADAY) 0.2 % SOLN Apply 1 drop to eye as needed.    Marland Kitchen omeprazole (PRILOSEC) 20 MG capsule Take 20 mg by mouth 2 (two) times daily before a meal.    . OVER THE COUNTER MEDICATION Place 2 sprays into both nostrils daily as needed (for allergies). Clarispray    . oxyCODONE (OXY IR/ROXICODONE) 5 MG immediate release tablet Take 1-2 tablets (5-10 mg total) by mouth every 6 (six) hours as needed for severe pain. 40 tablet 0  . PRESCRIPTION MEDICATION Inject as directed every 30 (thirty) days. Allergy Injections    . sertraline (ZOLOFT) 100 MG tablet Take 100 mg by mouth daily.    . simvastatin (ZOCOR) 40 MG tablet TAKE 1 TABLET BY MOUTH AT BEDTIME. (Patient taking differently: TAKE 40 MG BY MOUTH AT BEDTIME.) 30 tablet 9  . SUMAtriptan (IMITREX) 100 MG tablet Take 100 mg by mouth as needed. For migraines    . Tiotropium Bromide Monohydrate (SPIRIVA RESPIMAT) 1.25 MCG/ACT AERS Inhale 2 sprays into the lungs daily.     Marland Kitchen warfarin (COUMADIN) 5 MG tablet Take 1 tablet daily except 1/2 tablet on Sundays 45 tablet 3   No current facility-administered medications for this visit.    Physical Exam: BP 140/94 mmHg  Pulse 100  Resp 20  Ht 5\' 4"  (1.626 m)  Wt 173 lb (  78.472 kg)  BMI 29.68 kg/m2  SpO2 98% She looks well. Lung exam is clear. Cardiac exam shows a regular rate and rhythm with mechanical heart sounds. Chest incision is healing well and sternum is stable. There is no peripheral edema.   Diagnostic Tests:  CLINICAL DATA: Status post aortic valve replacement in April 2017, follow-up study, no current chest complaints.  EXAM: CHEST 2 VIEW  COMPARISON: Portable chest x-ray of June 20, 2015  FINDINGS: The lungs are mildly hyperinflated and clear. The heart and pulmonary vascularity are normal. The prosthetic aortic valve ring is in stable position radiographically. The mediastinum is normal in width. There is no pleural  effusion or pneumothorax. The retrosternal soft tissues are normal. The sternal wires are intact. There is mild multilevel degenerative disc disease of the mid thoracic spine.  IMPRESSION: COPD. There is no CHF, pneumonia, pleural effusion, or other acute cardiopulmonary disease. No postprocedure complication is observed.   Electronically Signed  By: David Martinique M.D.  On: 07/22/2015 12:55   Impression:  Overall I think she is doing well. I encouraged her to continue walking. She is planning to participate in cardiac rehab. I told her that she could drive a car but should not lift anything heavier than 10 lbs for three months postop. Her INR was 2.2 yesterday and her Coumadin dose was increased slightly to 5 mg daily. Her goal is 2.5-3. Her BP is a little higher than it should be long term. She is on Cardizem because she is allergic to beta blockers.    Plan:  She will continue to follow up with cardiology tomorrow and her BP medication can be adjusted at that time. She will return to see me if she develops any problems with her incisions.   Gaye Pollack, MD [facility] [phone]

## 2015-07-23 ENCOUNTER — Ambulatory Visit (INDEPENDENT_AMBULATORY_CARE_PROVIDER_SITE_OTHER): Payer: Medicaid Other | Admitting: Physician Assistant

## 2015-07-23 ENCOUNTER — Encounter: Payer: Self-pay | Admitting: Physician Assistant

## 2015-07-23 VITALS — BP 150/74 | HR 80 | Ht 64.0 in | Wt 163.0 lb

## 2015-07-23 DIAGNOSIS — Z954 Presence of other heart-valve replacement: Secondary | ICD-10-CM

## 2015-07-23 DIAGNOSIS — I2510 Atherosclerotic heart disease of native coronary artery without angina pectoris: Secondary | ICD-10-CM

## 2015-07-23 DIAGNOSIS — I4892 Unspecified atrial flutter: Secondary | ICD-10-CM | POA: Diagnosis not present

## 2015-07-23 DIAGNOSIS — I5032 Chronic diastolic (congestive) heart failure: Secondary | ICD-10-CM

## 2015-07-23 DIAGNOSIS — E785 Hyperlipidemia, unspecified: Secondary | ICD-10-CM

## 2015-07-23 DIAGNOSIS — Z952 Presence of prosthetic heart valve: Secondary | ICD-10-CM

## 2015-07-23 DIAGNOSIS — I10 Essential (primary) hypertension: Secondary | ICD-10-CM

## 2015-07-23 MED ORDER — POTASSIUM CHLORIDE CRYS ER 20 MEQ PO TBCR: 20.0000 meq | EXTENDED_RELEASE_TABLET | Freq: Every day | ORAL | Status: DC

## 2015-07-23 MED ORDER — FUROSEMIDE 40 MG PO TABS: 40.0000 mg | ORAL_TABLET | Freq: Every day | ORAL | Status: DC

## 2015-07-23 MED ORDER — PRAVASTATIN SODIUM 40 MG PO TABS: 40.0000 mg | ORAL_TABLET | Freq: Every evening | ORAL | Status: DC

## 2015-07-23 MED ORDER — DILTIAZEM HCL ER COATED BEADS 360 MG PO CP24: 360.0000 mg | ORAL_CAPSULE | Freq: Every day | ORAL | Status: DC

## 2015-07-23 MED ORDER — ASPIRIN EC 81 MG PO TBEC: 81.0000 mg | DELAYED_RELEASE_TABLET | Freq: Every day | ORAL | Status: DC

## 2015-07-23 NOTE — Patient Instructions (Addendum)
Medication Instructions:  1. STOP DIGOXIN  2. STOP SIMVASTATIN 3. INCREASE CARDIZEM CD TO 360 MG DAILY; NEW RX 4. START POTASSIUM 20 MEQ DAILY; NEW RX  5. START ASPIRIN 81 MG DAILY 6. START LASIX 40 MG DAILY; RX SENT  7. START PRAVASTATIN 40 MG 1 TABLET AT BEDTIME; RX SENT Labwork: BMET TO BE DONE 1 WEEK Testing/Procedures: Your physician has requested that you have an echocardiogram.  THIS IS TO BE DONE AT [facility]. Echocardiography is a painless test that uses sound waves to create images of your heart. It provides your doctor with information about the size and shape of your heart and how well your heart's chambers and valves are working. This procedure takes approximately one hour. There are no restrictions for this procedure. Follow-Up: DR. [name] IN 3 MONTHS  Any Other Special Instructions Will Be Listed Below (If Applicable). OK TO CALL CARDIAC REHAB TO GET STARTED If you need a refill on your cardiac medications before your next appointment, please call your pharmacy.

## 2015-07-30 ENCOUNTER — Telehealth: Payer: Self-pay | Admitting: *Deleted

## 2015-07-30 ENCOUNTER — Other Ambulatory Visit: Payer: Medicaid Other

## 2015-07-30 ENCOUNTER — Telehealth: Payer: Self-pay | Admitting: Cardiovascular Disease

## 2015-07-30 ENCOUNTER — Other Ambulatory Visit (HOSPITAL_COMMUNITY)
Admission: RE | Admit: 2015-07-30 | Discharge: 2015-07-30 | Disposition: A | Discharge status: Home / Self Care | Payer: Medicaid Other | Source: Ambulatory Visit | Attending: Physician Assistant | Admitting: Physician Assistant

## 2015-07-30 ENCOUNTER — Ambulatory Visit (HOSPITAL_COMMUNITY)
Admission: RE | Admit: 2015-07-30 | Discharge: 2015-07-30 | Disposition: A | Discharge status: Home / Self Care | Payer: Medicaid Other | Source: Ambulatory Visit | Attending: Physician Assistant | Admitting: Physician Assistant

## 2015-07-30 ENCOUNTER — Encounter: Payer: Self-pay | Admitting: Physician Assistant

## 2015-07-30 DIAGNOSIS — Z952 Presence of prosthetic heart valve: Secondary | ICD-10-CM

## 2015-07-30 DIAGNOSIS — I359 Nonrheumatic aortic valve disorder, unspecified: Secondary | ICD-10-CM | POA: Diagnosis present

## 2015-07-30 DIAGNOSIS — I2510 Atherosclerotic heart disease of native coronary artery without angina pectoris: Secondary | ICD-10-CM

## 2015-07-30 DIAGNOSIS — I517 Cardiomegaly: Secondary | ICD-10-CM | POA: Diagnosis not present

## 2015-07-30 DIAGNOSIS — E785 Hyperlipidemia, unspecified: Secondary | ICD-10-CM

## 2015-07-30 DIAGNOSIS — I340 Nonrheumatic mitral (valve) insufficiency: Secondary | ICD-10-CM | POA: Insufficient documentation

## 2015-07-30 DIAGNOSIS — I071 Rheumatic tricuspid insufficiency: Secondary | ICD-10-CM | POA: Diagnosis not present

## 2015-07-30 DIAGNOSIS — I4892 Unspecified atrial flutter: Secondary | ICD-10-CM

## 2015-07-30 DIAGNOSIS — I5032 Chronic diastolic (congestive) heart failure: Secondary | ICD-10-CM | POA: Diagnosis not present

## 2015-07-30 DIAGNOSIS — Z954 Presence of other heart-valve replacement: Secondary | ICD-10-CM

## 2015-07-30 LAB — LIPID PANEL
CHOL/HDL RATIO: 3.1 ratio
Cholesterol: 171 mg/dL (ref 0–200)
HDL: 55 mg/dL (ref >40)
LDL CALC: 79 mg/dL (ref 0–99)
Triglycerides: 186 mg/dL — ABNORMAL HIGH (ref <150)
VLDL: 37 mg/dL (ref 0–40)

## 2015-07-30 LAB — BASIC METABOLIC PANEL
Anion gap: 8 (ref 5–15)
BUN: 12 mg/dL (ref 6–20)
CALCIUM: 8.8 mg/dL — AB (ref 8.9–10.3)
CHLORIDE: 100 mmol/L — AB (ref 101–111)
CO2: 29 mmol/L (ref 22–32)
CREATININE: 0.59 mg/dL (ref 0.44–1.00)
Glucose, Bld: 106 mg/dL — ABNORMAL HIGH (ref 65–99)
Potassium: 4 mmol/L (ref 3.5–5.1)
SODIUM: 137 mmol/L (ref 135–145)

## 2015-07-30 LAB — HEPATIC FUNCTION PANEL
ALK PHOS: 94 U/L (ref 38–126)
ALT: 16 U/L (ref 14–54)
AST: 21 U/L (ref 15–41)
Albumin: 4.2 g/dL (ref 3.5–5.0)
BILIRUBIN INDIRECT: 0.3 mg/dL (ref 0.3–0.9)
Bilirubin, Direct: 0.1 mg/dL (ref 0.1–0.5)
TOTAL PROTEIN: 7.3 g/dL (ref 6.5–8.1)
Total Bilirubin: 0.4 mg/dL (ref 0.3–1.2)

## 2015-07-30 NOTE — Telephone Encounter (Signed)
Pt notified of echo results by phone with verbal understanding. I also advised pt wait to hear back from Dr. [name] as to her call with Gilford Rile RN today. Pt agreeable to plan of care.

## 2015-07-30 NOTE — Telephone Encounter (Signed)
Pt notified of lab results. pt also notified that LFT/FLP were not to be done today, only bmet was supposed to be done. I will ask billing to credit FLP/LFT. Pt will have this lab work on 8/23, see Dr. [name] 8/24. Pt agreeable.

## 2015-07-30 NOTE — Telephone Encounter (Signed)
New Message:  Pt is calling to speak with you about her aspirin medication. It appears that she is getting conflicting information about taking the medication. Please f/u with her.

## 2015-07-30 NOTE — Telephone Encounter (Signed)
Spoke with pt and told her she should be on ASA 81 mg per last office note from [place], [place].  I told pt she should not take any other medicines that contain ASA. Pt reports since Digoxin was stopped at 5/25 office visit her heart rate has been consistently above 100.  Gives readings of 102 and 110.  She checks daily and it is always above 100. Had echo done today.  Feels heart rate is irregular.  Cardizem was increased at last office visit and pt reports she has been taking.  Reports blood pressure is around 145/70's.  Headaches have improved.  Will forward to Dr. [name] for review/recommendations.

## 2015-07-30 NOTE — Progress Notes (Signed)
*  PRELIMINARY RESULTS* Echocardiogram 2D Echocardiogram has been performed.  Leavy Cella 07/30/2015, 1:08 PM

## 2015-07-31 NOTE — Telephone Encounter (Signed)
Pat, Can we have her come by for a 48 hour monitor? Gerald Stabs

## 2015-07-31 NOTE — Telephone Encounter (Signed)
Spoke with pt and gave her information from Dr. [name].  She would like to proceed with holter.  Will have schedulers contact her with appt time.

## 2015-08-04 ENCOUNTER — Ambulatory Visit (INDEPENDENT_AMBULATORY_CARE_PROVIDER_SITE_OTHER): Payer: Medicaid Other

## 2015-08-04 ENCOUNTER — Ambulatory Visit (INDEPENDENT_AMBULATORY_CARE_PROVIDER_SITE_OTHER): Payer: Medicaid Other | Admitting: *Deleted

## 2015-08-04 DIAGNOSIS — I4892 Unspecified atrial flutter: Secondary | ICD-10-CM | POA: Diagnosis not present

## 2015-08-04 DIAGNOSIS — I359 Nonrheumatic aortic valve disorder, unspecified: Secondary | ICD-10-CM | POA: Diagnosis not present

## 2015-08-04 DIAGNOSIS — Z952 Presence of prosthetic heart valve: Secondary | ICD-10-CM

## 2015-08-04 DIAGNOSIS — Z954 Presence of other heart-valve replacement: Secondary | ICD-10-CM

## 2015-08-04 DIAGNOSIS — Z5181 Encounter for therapeutic drug level monitoring: Secondary | ICD-10-CM

## 2015-08-04 LAB — POCT INR: INR: 2.6

## 2015-08-19 ENCOUNTER — Ambulatory Visit: Payer: Medicaid Other | Admitting: Surgery

## 2015-08-20 ENCOUNTER — Ambulatory Visit (INDEPENDENT_AMBULATORY_CARE_PROVIDER_SITE_OTHER): Payer: Medicaid Other | Admitting: *Deleted

## 2015-08-20 DIAGNOSIS — Z5181 Encounter for therapeutic drug level monitoring: Secondary | ICD-10-CM | POA: Diagnosis not present

## 2015-08-20 DIAGNOSIS — Z954 Presence of other heart-valve replacement: Secondary | ICD-10-CM

## 2015-08-20 DIAGNOSIS — I4892 Unspecified atrial flutter: Secondary | ICD-10-CM | POA: Diagnosis not present

## 2015-08-20 DIAGNOSIS — Z952 Presence of prosthetic heart valve: Secondary | ICD-10-CM

## 2015-08-20 DIAGNOSIS — I359 Nonrheumatic aortic valve disorder, unspecified: Secondary | ICD-10-CM

## 2015-08-20 LAB — POCT INR: INR: 3

## 2015-08-27 ENCOUNTER — Ambulatory Visit (INDEPENDENT_AMBULATORY_CARE_PROVIDER_SITE_OTHER): Payer: Medicaid Other | Admitting: *Deleted

## 2015-08-27 DIAGNOSIS — Z952 Presence of prosthetic heart valve: Secondary | ICD-10-CM

## 2015-08-27 DIAGNOSIS — I4892 Unspecified atrial flutter: Secondary | ICD-10-CM

## 2015-08-27 DIAGNOSIS — Z5181 Encounter for therapeutic drug level monitoring: Secondary | ICD-10-CM

## 2015-08-27 DIAGNOSIS — I359 Nonrheumatic aortic valve disorder, unspecified: Secondary | ICD-10-CM | POA: Diagnosis not present

## 2015-08-27 DIAGNOSIS — Z954 Presence of other heart-valve replacement: Secondary | ICD-10-CM

## 2015-08-27 LAB — POCT INR: INR: 2.1

## 2015-09-09 ENCOUNTER — Telehealth: Payer: Self-pay | Admitting: Cardiovascular Disease

## 2015-09-09 DIAGNOSIS — I2510 Atherosclerotic heart disease of native coronary artery without angina pectoris: Secondary | ICD-10-CM

## 2015-09-09 DIAGNOSIS — I5032 Chronic diastolic (congestive) heart failure: Secondary | ICD-10-CM

## 2015-09-09 NOTE — Telephone Encounter (Signed)
New message  Pt c/o swelling: STAT is pt has developed SOB within 24 hours  1. How long have you been experiencing swelling? Pt daughter states its be going on for while but go worse yesterday 7/11 today its really swollen   2. Where is the swelling located? Lower legs and feet   3.  Are you currently taking a "fluid pill"? yes  4.  Are you currently SOB? Pt daughter states pt has history of sob, but last couple of say its gotten worse   5.  Have you traveled recently? No

## 2015-09-09 NOTE — Telephone Encounter (Signed)
I spoke with pt and gave her instructions from Dr. [name].  I asked her to weigh daily and let us know weights when she called.  I told her to let us know if symptoms worsen or do not improve after increasing furosemide.

## 2015-09-09 NOTE — Telephone Encounter (Signed)
I would have her take Lasix 40 mg po TID for the next 4 days then call us next week with she is feeling. She should take an extra 20 meq KDur for the next 4 days. Thanks, chris

## 2015-09-09 NOTE — Telephone Encounter (Signed)
Spoke with pt. She reports she had been doing well after surgery until last couple of days. She reports a gradual increase in swelling in legs since office visit with Richardson Dopp, PA on 07/23/15.  This swelling got much worse the last few days.  Today she reports swelling in legs from ankles to above knees. Wearing flip flops due to swelling in feet. Reports swelling is in both legs--right may be a little worse than left. Short of breath the last few days.  Weight on Friday was 158 lbs and yesterday was 165 lbs. She has not weighed today.  Also reports "weird feeling" in chest, arm, back yesterday evening. Has history of back pain but does not feel this is her back pain. Describes as a shooting pain in her arm that worsens when she moves her arm. Was very painful last night and she had difficulty getting comfortable in bed.  Today this pain is better. I instructed pt to take additional furosemide 40 mg at this time and I would send to Dr. [name] for recommendations.

## 2015-09-15 ENCOUNTER — Ambulatory Visit (INDEPENDENT_AMBULATORY_CARE_PROVIDER_SITE_OTHER): Payer: Medicaid Other | Admitting: *Deleted

## 2015-09-15 DIAGNOSIS — I359 Nonrheumatic aortic valve disorder, unspecified: Secondary | ICD-10-CM | POA: Diagnosis not present

## 2015-09-15 DIAGNOSIS — I4892 Unspecified atrial flutter: Secondary | ICD-10-CM | POA: Diagnosis not present

## 2015-09-15 DIAGNOSIS — Z952 Presence of prosthetic heart valve: Secondary | ICD-10-CM

## 2015-09-15 DIAGNOSIS — Z954 Presence of other heart-valve replacement: Secondary | ICD-10-CM

## 2015-09-15 DIAGNOSIS — Z5181 Encounter for therapeutic drug level monitoring: Secondary | ICD-10-CM

## 2015-09-15 LAB — POCT INR: INR: 2.5

## 2015-09-16 MED ORDER — POTASSIUM CHLORIDE CRYS ER 20 MEQ PO TBCR: EXTENDED_RELEASE_TABLET | ORAL | Status: DC

## 2015-09-16 MED ORDER — FUROSEMIDE 40 MG PO TABS: 40.0000 mg | ORAL_TABLET | Freq: Two times a day (BID) | ORAL | Status: DC

## 2015-09-16 NOTE — Telephone Encounter (Signed)
Spoke with pt. She reports she took extra Lasix through Saturday. On Sunday swelling was gone and pain also gone.  She went back to taking lasix 40 mg daily. Yesterday she began having shortness of breath and swelling again. Weight on 7/14 was 163 lbs early in the day and then 160 lbs later in the day. On 7/15 it was 158 lbs.  Did not weigh again until yesterday. Weight yesterday was 161 lbs and today is 162 lbs. Our medication list indicates pt is taking K-dur 20 meq daily but pt reports she actually takes 2 tablets daily.  Will review with provider in office.

## 2015-09-16 NOTE — Telephone Encounter (Signed)
Reviewed with Richardson Dopp, PA and pt should increase furosemide to 40 mg twice daily and K-dur to 60 meq daily. BMET in one week.  I spoke with pt and gave her these instructions. She will take 40 meq K-dur in the AM and K-dur 20 meq in the PM. She will come in for lab work on 7/26.  I asked her to continue to weigh daily and to call if weight does not go down or if she continues to have swelling and shortness of breath.

## 2015-09-23 ENCOUNTER — Other Ambulatory Visit: Payer: Medicaid Other

## 2015-10-02 ENCOUNTER — Encounter (HOSPITAL_COMMUNITY): Payer: Self-pay

## 2015-10-06 ENCOUNTER — Ambulatory Visit (INDEPENDENT_AMBULATORY_CARE_PROVIDER_SITE_OTHER): Payer: Medicaid Other | Admitting: *Deleted

## 2015-10-06 DIAGNOSIS — Z952 Presence of prosthetic heart valve: Secondary | ICD-10-CM

## 2015-10-06 DIAGNOSIS — Z954 Presence of other heart-valve replacement: Secondary | ICD-10-CM

## 2015-10-06 DIAGNOSIS — Z5181 Encounter for therapeutic drug level monitoring: Secondary | ICD-10-CM | POA: Diagnosis not present

## 2015-10-06 DIAGNOSIS — I359 Nonrheumatic aortic valve disorder, unspecified: Secondary | ICD-10-CM

## 2015-10-06 DIAGNOSIS — I4892 Unspecified atrial flutter: Secondary | ICD-10-CM

## 2015-10-06 LAB — POCT INR: INR: 1.8

## 2015-10-07 ENCOUNTER — Encounter: Payer: Self-pay | Admitting: Cardiovascular Disease

## 2015-10-21 ENCOUNTER — Other Ambulatory Visit: Payer: Medicaid Other

## 2015-10-22 ENCOUNTER — Ambulatory Visit (INDEPENDENT_AMBULATORY_CARE_PROVIDER_SITE_OTHER): Payer: Medicaid Other | Admitting: Cardiovascular Disease

## 2015-10-22 ENCOUNTER — Telehealth: Payer: Self-pay | Admitting: Cardiovascular Disease

## 2015-10-22 ENCOUNTER — Telehealth: Payer: Self-pay | Admitting: *Deleted

## 2015-10-22 ENCOUNTER — Other Ambulatory Visit: Payer: Medicaid Other | Admitting: *Deleted

## 2015-10-22 VITALS — BP 122/80 | HR 74 | Ht 64.0 in | Wt 160.8 lb

## 2015-10-22 DIAGNOSIS — I2510 Atherosclerotic heart disease of native coronary artery without angina pectoris: Secondary | ICD-10-CM

## 2015-10-22 DIAGNOSIS — I10 Essential (primary) hypertension: Secondary | ICD-10-CM

## 2015-10-22 DIAGNOSIS — I359 Nonrheumatic aortic valve disorder, unspecified: Secondary | ICD-10-CM | POA: Diagnosis not present

## 2015-10-22 DIAGNOSIS — I5032 Chronic diastolic (congestive) heart failure: Secondary | ICD-10-CM | POA: Diagnosis not present

## 2015-10-22 DIAGNOSIS — E785 Hyperlipidemia, unspecified: Secondary | ICD-10-CM

## 2015-10-22 DIAGNOSIS — Z954 Presence of other heart-valve replacement: Secondary | ICD-10-CM

## 2015-10-22 DIAGNOSIS — F17201 Nicotine dependence, unspecified, in remission: Secondary | ICD-10-CM

## 2015-10-22 DIAGNOSIS — Z952 Presence of prosthetic heart valve: Secondary | ICD-10-CM

## 2015-10-22 DIAGNOSIS — E876 Hypokalemia: Secondary | ICD-10-CM

## 2015-10-22 LAB — BASIC METABOLIC PANEL
BUN: 12 mg/dL (ref 7–25)
CALCIUM: 9.1 mg/dL (ref 8.6–10.4)
CHLORIDE: 99 mmol/L (ref 98–110)
CO2: 30 mmol/L (ref 20–31)
CREATININE: 0.85 mg/dL (ref 0.50–1.05)
Glucose, Bld: 86 mg/dL (ref 65–99)
Potassium: 3.4 mmol/L — ABNORMAL LOW (ref 3.5–5.3)
Sodium: 138 mmol/L (ref 135–146)

## 2015-10-22 LAB — LIPID PANEL
Cholesterol: 178 mg/dL (ref 125–200)
HDL: 67 mg/dL (ref >=46)
LDL CALC: 92 mg/dL (ref <130)
Total CHOL/HDL Ratio: 2.7 Ratio (ref <=5.0)
Triglycerides: 97 mg/dL (ref <150)
VLDL: 19 mg/dL (ref <30)

## 2015-10-22 LAB — HEPATIC FUNCTION PANEL
ALBUMIN: 4.1 g/dL (ref 3.6–5.1)
ALK PHOS: 78 U/L (ref 33–130)
ALT: 15 U/L (ref 6–29)
AST: 18 U/L (ref 10–35)
BILIRUBIN INDIRECT: 0.5 mg/dL (ref 0.2–1.2)
Bilirubin, Direct: 0.1 mg/dL (ref <=0.2)
TOTAL PROTEIN: 6.1 g/dL (ref 6.1–8.1)
Total Bilirubin: 0.6 mg/dL (ref 0.2–1.2)

## 2015-10-22 MED ORDER — FUROSEMIDE 40 MG PO TABS: 40.0000 mg | ORAL_TABLET | Freq: Two times a day (BID) | ORAL | 6 refills | Status: DC

## 2015-10-22 MED ORDER — POTASSIUM CHLORIDE CRYS ER 20 MEQ PO TBCR: 40.0000 meq | EXTENDED_RELEASE_TABLET | Freq: Two times a day (BID) | ORAL | 3 refills | Status: DC

## 2015-10-22 NOTE — Telephone Encounter (Signed)
I agree with this plan. thanks

## 2015-10-22 NOTE — Telephone Encounter (Signed)
New message   Pt verbalized that the medication was called in incorrectly  Pt said she cant take the type of medication that was called it because she cant take the pill so big    Pt c/o medication issue:  1. Name of Medication: Potassium   2. How are you currently taking this medication (dosage and times per day)?2 by mouth 2x day  3. Are you having a reaction (difficulty breathing--STAT)? no 4. What is your medication issue? Pt wants the dosage to be differently

## 2015-10-22 NOTE — Addendum Note (Signed)
Addended by: Eulis Foster on: 10/22/2015 09:49 AM   Modules accepted: Orders

## 2015-10-22 NOTE — Telephone Encounter (Signed)
Just as pt answered phone I saw that Gregery Na RN had just s/w pt about 1 hour earlier and went over results and recommendations. I apologized for duplicate call, pt said no problem.

## 2015-10-22 NOTE — Telephone Encounter (Signed)
Spoke with pharmacy and they will fill with Klor con which should be smaller. Pt notified.

## 2015-10-22 NOTE — Patient Instructions (Signed)
Medication Instructions:  Your physician has recommended you make the following change in your medication: Increase furosemide to 40 mg by mouth three times daily for 3 days then return to normal dose of twice daily.     Labwork: none  Testing/Procedures: none  Follow-Up: Your physician wants you to follow-up in: 6 months.  You will receive a reminder letter in the mail two months in advance. If you don't receive a letter, please call our office to schedule the follow-up appointment.   Any Other Special Instructions Will Be Listed Below (If Applicable).     If you need a refill on your cardiac medications before your next appointment, please call your pharmacy.

## 2015-10-22 NOTE — Progress Notes (Signed)
Chief Complaint  Patient presents with  . Aortic Valve Disease    History of Present Illness: 55 yo female with history of bicuspid aortic valve s/p Ross procedure in 1998 with thoracic aortic aneurysm and now s/p Bentall procedure in April 2017 who is here today for cardiac follow up. She also has O2 dependent COPD, GERD, anxiety and tobacco abuse. She has been followed for aortic valve insufficiency and thoracic aortic aneurysm and underwent Bentall procedure per Dr. Cyndia Bent on 06/15/15.  Cardiac cath March 2017 with mild CAD (20% RCA stenosis). Echo June 2017 with normally functioning mechanical aortic valve replacement, normal LV systolic function. Cardiac monitor June 2017 with PACs, PVCs. She has chronic diastolic CHF and has had adjustment of Lasix over the last month for LE edema/increased weight.   She is here today for follow up.  She is feeling well. No chest pain or dyspnea. LE edema is better today. Her weight is down to 160 lbs which is down 13 lbs over 3 months but still 5 lbs above baseline.   Primary Care Physician: [name], MD  Past Medical History:  Diagnosis Date  . Anxiety   . Aortic aneurysm ([place])   . Aortic insufficiency    a. s/p Bentall with mechanical AVR 4/17 >> FU Echo 6/17: Mild LVH, EF 60-65%, normal wall motion, ventricular septum with paradoxical septal motion, St. Jude mechanical AVR functioning  normally with no regurgitation (mean 6 mmHg), MAC, trivial MR, mild RVE, trivial TR  . Aortic stenosis, severe    Bicuspid valve  . Arthritis   . Asthma   . Bronchitis   . CHF (congestive heart failure) ([place])   . COPD (chronic obstructive pulmonary disease) ([place])   . DDD (degenerative disc disease), cervical   . Depression   . Dysrhythmia    palpitations  . GERD (gastroesophageal reflux disease)   . Hard of hearing   . History of pneumonia   . 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 Migraine headache   . Palpitations     . PONV (postoperative nausea and vomiting)    "only once"  . Shortness of breath    Nodule left lung CT done 8/6  . Stress incontinence   . Urinary frequency    due to Lasix  . Valvular heart disease     Past Surgical History:  Procedure Laterality Date  . ABDOMINAL HYSTERECTOMY    . ANTERIOR CERVICAL DECOMP/DISCECTOMY FUSION  10/05/2011   Procedure: ANTERIOR CERVICAL DECOMPRESSION/DISCECTOMY FUSION 2 LEVELS;  Surgeon: Floyce Stakes, MD;  Location: [facility];  Service: Neurosurgery;  Laterality: N/A;  Cervical five - six , six - seven Anterior cervical decompression/diskectomy/fusion/plate  . ANTERIOR CRUCIATE LIGAMENT REPAIR Right   . AORTIC VALVE REPLACEMENT    . ARTHROSCOPIC REPAIR ACL  rt  . BENTALL PROCEDURE N/A 06/15/2015   Procedure: BENTALL PROCEDURE; HEMI-ARCH REPAIR WITH #23 ST JUDE MECHANICAL AVR CONDUIT AND #28 HEMASHIELD PLATINUM GRAFT;  Surgeon: Gaye Pollack, MD;  Location: [facility];  Service: Open Heart Surgery;  Laterality: N/A;  . CARDIAC CATHETERIZATION N/A 05/21/2015   Procedure: Right/Left Heart Cath and Coronary Angiography;  Surgeon: [name], MD;  Location: [facility];  Service: Cardiovascular;  Laterality: N/A;  . CHOLECYSTECTOMY    . COLONOSCOPY  10/13/05  . DIAGNOSTIC LAPAROSCOPY    . JOINT REPLACEMENT Right 2016  . KNEE ARTHROSCOPY     rt x4  x1 lft  .  MYRINGOPLASTY W/ FAT GRAFT     graft from behind ear  . SIGMOIDOSCOPY  10/13/05, 09/19/05  . TEE WITHOUT CARDIOVERSION N/A 06/15/2015   Procedure: TRANSESOPHAGEAL ECHOCARDIOGRAM (TEE);  Surgeon: Gaye Pollack, MD;  Location: [facility];  Service: Open Heart Surgery;  Laterality: N/A;  . TONSILLECTOMY  2005  . TUBAL LIGATION    . TYMPANOSTOMY TUBE PLACEMENT      Current Outpatient Prescriptions  Medication Sig Dispense Refill  . albuterol (PROVENTIL) (2.5 MG/3ML) 0.083% nebulizer solution Take 2.5 mg by nebulization every 6 (six) hours as needed for wheezing or shortness of breath.     Marland Kitchen albuterol (VENTOLIN HFA) 108 (90 BASE) MCG/ACT inhaler Inhale 1 puff into the lungs 4 (four) times daily as needed. For shortness of breath    . ALPRAZolam (XANAX) 1 MG tablet Take 1 mg by mouth 4 (four) times daily as needed. For anxiety    . aspirin EC 81 MG tablet Take 1 tablet (81 mg total) by mouth daily.    . budesonide-formoterol (SYMBICORT) 160-4.5 MCG/ACT inhaler Inhale 2 puffs into the lungs 2 (two) times daily.    Marland Kitchen buPROPion (WELLBUTRIN SR) 150 MG 12 hr tablet Take 150 mg by mouth 2 (two) times daily.    . cetirizine (ZYRTEC) 10 MG tablet Take 10 mg by mouth every morning.     . diltiazem (CARDIZEM CD) 360 MG 24 hr capsule Take 1 capsule (360 mg total) by mouth daily. 90 capsule 3  . EPINEPHrine (EPIPEN) 0.3 mg/0.3 mL DEVI Inject 0.3 mg into the muscle as needed. For allergic reaction    . furosemide (LASIX) 40 MG tablet Take 1 tablet (40 mg total) by mouth 2 (two) times daily. May take one extra tablet daily for swelling as needed 70 tablet 6  . montelukast (SINGULAIR) 10 MG tablet Take 10 mg by mouth at bedtime.     . Naloxegol Oxalate (MOVANTIK PO) Take 1 tablet by mouth daily.    . Olopatadine HCl (PATADAY) 0.2 % SOLN Place 1 drop into both eyes as needed (FOR ALLERGIES).     Marland Kitchen omeprazole (PRILOSEC) 20 MG capsule Take 20 mg by mouth 2 (two) times daily before a meal.    . oxyCODONE-acetaminophen (PERCOCET) 10-325 MG tablet Take 1 tablet by mouth every 4 (four) hours as needed for pain.    Marland Kitchen oxymorphone (OPANA ER) 20 MG 12 hr tablet Take 20 mg by mouth every 12 (twelve) hours as needed for pain.    . potassium chloride SA (K-DUR,KLOR-CON) 20 MEQ tablet Take 2 tablets (40 mEq total) by mouth 2 (two) times daily. 120 tablet 3  . pravastatin (PRAVACHOL) 40 MG tablet Take 1 tablet (40 mg total) by mouth every evening. 90 tablet 3  . PRESCRIPTION MEDICATION Inject as directed every 30 (thirty) days. Allergy Injections    . sertraline (ZOLOFT) 100 MG tablet Take 100 mg by mouth daily.     . SUMAtriptan (IMITREX) 100 MG tablet Take 100 mg by mouth as needed for migraine (patient states the bottle doesnt have a time based frequency). For migraines    . Tiotropium Bromide Monohydrate (SPIRIVA RESPIMAT) 1.25 MCG/ACT AERS Inhale 2 sprays into the lungs daily.     Marland Kitchen warfarin (COUMADIN) 5 MG tablet Take 1 tablet daily except 1/2 tablet on Sundays 45 tablet 3   No current facility-administered medications for this visit.     Allergies  Allergen Reactions  . Beta Adrenergic Blockers Anaphylaxis  . Peanut-Containing Drug Products Anaphylaxis,  Shortness Of Breath, Swelling and Other (See Comments)    Swelling of the throat  . Shrimp [Shellfish Allergy] Anaphylaxis  . Vancomycin Swelling    Rash and severe swelling  . Avelox [Moxifloxacin Hcl In Nacl] Rash  . Ciprofloxacin Nausea And Vomiting  . Cleocin [Clindamycin Hcl] Rash  . Moxifloxacin Rash  . Sulfamethoxazole-Trimethoprim Rash  . Tape Other (See Comments)    White clear itches, rash    Social History   Social History  . Marital status: Legally Separated    Spouse name: N/A  . Number of children: N/A  . Years of education: N/A   Occupational History  . Not on file.   Social History Main Topics  . Smoking status: Former Smoker    Packs/day: 0.10    Years: 30.00    Types: Cigarettes    Quit date: 04/29/2015  . Smokeless tobacco: Never Used  . Alcohol use No  . Drug use: No  . Sexual activity: No   Other Topics Concern  . Not on file   Social History Narrative  . No narrative on file    Family History  Problem Relation Age of Onset  . Heart attack Father   . Cancer Mother     Type unknown  . Diabetes      family history  . Asthma Brother   . Hyperlipidemia      family history    Review of Systems:  As stated in the HPI and otherwise negative.   BP 122/80   Pulse 74   Ht 5\' 4"  (1.626 m)   Wt 160 lb 12.8 oz (72.9 kg)   SpO2 93%   BMI 27.60 kg/m   Physical Examination: General: Well  developed, well nourished, NAD  HEENT: OP clear, mucus membranes moist  SKIN: warm, dry. No rashes. Neuro: No focal deficits  Musculoskeletal: Muscle strength 5/5 all ext  Psychiatric: Mood and affect normal  Neck: No JVD, no carotid bruits, no thyromegaly, no lymphadenopathy.  Lungs:Clear bilaterally, no wheezes, rhonci, crackles Cardiovascular: Regular rate and rhythm. Systolic murmur. No gallops or rubs. Abdomen:Soft. Bowel sounds present. Non-tender.  Extremities: No lower extremity edema. Pulses are 2 + in the bilateral DP/PT.  Echo 07/30/15: Left ventricle: The cavity size was normal. Wall thickness was   increased in a pattern of mild LVH. Systolic function was normal.   The estimated ejection fraction was in the range of 60% to 65%.   Wall motion was normal; there were no regional wall motion   abnormalities. - Ventricular septum: Somewhat paradoxical septal motion with   intermittent respiratory flattening. - Aortic valve: Status post placement of 28 mm Hemashield graft. A   St. Jude Medical mechanical prosthesis was present. There was no   significant regurgitation. Mean gradient (S): 6 mm Hg. Peak   gradient (S): 13 mm Hg. - Mitral valve: Calcified annulus. Mildly thickened leaflets .   There was trivial regurgitation. - Right ventricle: Systolic function was mildly reduced. - Tricuspid valve: There was trivial regurgitation. - Pulmonic valve: Poorly visualized. Transvalvular gradient normal   range. Patient reported to be status post Ross procedure. - Pulmonary arteries: Systolic pressure could not be accurately   estimated. - Pericardium, extracardiac: There was no pericardial effusion.  Impressions:  - Mild LVH with LVEF 60-65%. Grossly normal diastolic function.   Somewhat paradoxical septal motion with intermittent respiratory   flattening. MAC with mildly thickened mitral leaflets and trivial   mitral regurgitation. St. Jude aortic prosthesis with  grossly    normal function and no significant aortic regurgitation. Mildly   reduced RV contraction. Pulmonic valve poorly visualized, but   transvalvular gradient is normal range. Unable to assess PASP.  EKG:  EKG is not ordered today. The ekg ordered today demonstrates   Recent Labs: 06/16/2015: Magnesium 2.0 06/21/2015: Hemoglobin 8.7; Platelets 288 07/30/2015: ALT 16; BUN 12; Creatinine, Ser 0.59; Potassium 4.0; Sodium 137   Lipid Panel    Component Value Date/Time   CHOL 171 07/30/2015 1015   TRIG 186 (H) 07/30/2015 1015   HDL 55 07/30/2015 1015   CHOLHDL 3.1 07/30/2015 1015   VLDL 37 07/30/2015 1015   LDLCALC 79 07/30/2015 1015   LDLDIRECT 79.0 03/25/2014 0947     Wt Readings from Last 3 Encounters:  10/22/15 160 lb 12.8 oz (72.9 kg)  07/23/15 163 lb (73.9 kg)  07/22/15 173 lb (78.5 kg)     Other studies Reviewed: Additional studies/ records that were reviewed today include: . Review of the above records demonstrates:    Assessment and Plan:   1. Aortic valve disease: s/p mechanical AVR April 2017. Functioning well by echo June 2017. She is on coumadin.   2. Thoracic aortic aneurysm: S/p Bentall procedure April 2017.   3. Tobacco abuse, in remission: She has stopped smoking.   4. Palpitations: PACs, PVCs noted on monitor June 2017. She did have post-op atrial flutter. She is on diltiazem.   5. HLD: on statin.  6. Chronic diastolic CHF: She is on Lasix. Volume status is ok today but she is still up a few lbs from baseline weight. Will have her take Lasix 40 mg TID for 3 days then Lasix 40 mg BID and adjust dosage daily based on weight and swelling. I have instructed her to take extra 40 mg Lasix if weight is up 2-3 lbs or LE is worsened.   7. CAD: Mild RCA plaque by cath 2017. Continue ASA and statin.   8. HTN: BP is controlled. No changes.    Current medicines are reviewed at length with the patient today.  The patient does not have concerns regarding medicines.  The  following changes have been made:  no change  Labs/ tests ordered today include:   No orders of the defined types were placed in this encounter.   Disposition:   FU with me in 12 months  Signed, Lauree Chandler, MD 10/22/2015 2:11 PM    [facility] [address], [place], [place]  [address] Phone: [phone]; Fax: [phone]

## 2015-10-22 NOTE — Telephone Encounter (Signed)
Notes Recorded by Katrine Coho, RN on 10/22/2015 at 4:17 PM EDT Reviewed with Dr [name]--  Increase KCL to 40 mEq tid for 3 days while on higher lasix dose of 40mg  tid for 3 days, then decrease KCL to usual dose of 40 mEq bid.  Repeat BMET in 1 week.  Preliminarily reviewed by Triage. Awaiting MD review and signature.   10/22/15  I discussed Dr Camillia Herter recommendations with pt, repeat BMET scheduled for 10/30/15.

## 2015-10-27 ENCOUNTER — Ambulatory Visit (INDEPENDENT_AMBULATORY_CARE_PROVIDER_SITE_OTHER): Payer: Medicaid Other | Admitting: *Deleted

## 2015-10-27 DIAGNOSIS — Z954 Presence of other heart-valve replacement: Secondary | ICD-10-CM

## 2015-10-27 DIAGNOSIS — I4892 Unspecified atrial flutter: Secondary | ICD-10-CM

## 2015-10-27 DIAGNOSIS — I359 Nonrheumatic aortic valve disorder, unspecified: Secondary | ICD-10-CM

## 2015-10-27 DIAGNOSIS — Z5181 Encounter for therapeutic drug level monitoring: Secondary | ICD-10-CM | POA: Diagnosis not present

## 2015-10-27 DIAGNOSIS — Z952 Presence of prosthetic heart valve: Secondary | ICD-10-CM

## 2015-10-27 LAB — POCT INR: INR: 3.3

## 2015-10-30 ENCOUNTER — Other Ambulatory Visit: Payer: Medicaid Other

## 2015-11-03 ENCOUNTER — Telehealth: Payer: Self-pay | Admitting: Cardiovascular Disease

## 2015-11-03 NOTE — Telephone Encounter (Signed)
[name] is wanting to know if its ok for her to have her teeth clean . She open heart surgery on 06/15/15 . Please call   Thanks

## 2015-11-03 NOTE — Telephone Encounter (Signed)
Patient advised to wait 6 months post heart procedure for routine teeth cleaning.  She also was advised she will need prophylaxis prior to her cleaning. Advised to have dentist office fax over request for [place] to complete.  She is agreeable to plan.

## 2015-11-17 ENCOUNTER — Ambulatory Visit (INDEPENDENT_AMBULATORY_CARE_PROVIDER_SITE_OTHER): Payer: Medicaid Other | Admitting: *Deleted

## 2015-11-17 DIAGNOSIS — I4892 Unspecified atrial flutter: Secondary | ICD-10-CM | POA: Diagnosis not present

## 2015-11-17 DIAGNOSIS — I359 Nonrheumatic aortic valve disorder, unspecified: Secondary | ICD-10-CM

## 2015-11-17 DIAGNOSIS — Z954 Presence of other heart-valve replacement: Secondary | ICD-10-CM

## 2015-11-17 DIAGNOSIS — Z5181 Encounter for therapeutic drug level monitoring: Secondary | ICD-10-CM

## 2015-11-17 DIAGNOSIS — Z952 Presence of prosthetic heart valve: Secondary | ICD-10-CM

## 2015-11-17 LAB — POCT INR: INR: 1.9

## 2015-12-08 ENCOUNTER — Ambulatory Visit (INDEPENDENT_AMBULATORY_CARE_PROVIDER_SITE_OTHER): Payer: Medicaid Other | Admitting: *Deleted

## 2015-12-08 DIAGNOSIS — Z5181 Encounter for therapeutic drug level monitoring: Secondary | ICD-10-CM

## 2015-12-08 DIAGNOSIS — I359 Nonrheumatic aortic valve disorder, unspecified: Secondary | ICD-10-CM | POA: Diagnosis not present

## 2015-12-08 DIAGNOSIS — I4892 Unspecified atrial flutter: Secondary | ICD-10-CM

## 2015-12-08 DIAGNOSIS — Z952 Presence of prosthetic heart valve: Secondary | ICD-10-CM | POA: Diagnosis not present

## 2015-12-08 LAB — POCT INR: INR: 2.6

## 2015-12-08 MED ORDER — WARFARIN SODIUM 5 MG PO TABS: ORAL_TABLET | ORAL | 3 refills | Status: DC

## 2015-12-29 ENCOUNTER — Telehealth: Payer: Self-pay | Admitting: Cardiovascular Disease

## 2015-12-29 MED ORDER — AMOXICILLIN 500 MG PO TABS: ORAL_TABLET | ORAL | 4 refills | Status: DC

## 2015-12-29 NOTE — Telephone Encounter (Signed)
New Message  Pt voiced to have nurse to call pt back.  Pt voiced not having CP nor SOB.

## 2015-12-29 NOTE — Telephone Encounter (Signed)
Spoke with pt. She is going in soon for dental cleaning. No other dental work planned at this time. She has taken amoxicillin in past prior to dental appointments.  I told pt she would need to continue to take 2000 mgs amoxicillin 30-60 minutes prior to dental appointments.  Will send prescription to [facility].

## 2016-01-05 ENCOUNTER — Ambulatory Visit (INDEPENDENT_AMBULATORY_CARE_PROVIDER_SITE_OTHER): Payer: Medicaid Other | Admitting: *Deleted

## 2016-01-05 DIAGNOSIS — Z5181 Encounter for therapeutic drug level monitoring: Secondary | ICD-10-CM

## 2016-01-05 DIAGNOSIS — Z952 Presence of prosthetic heart valve: Secondary | ICD-10-CM | POA: Diagnosis not present

## 2016-01-05 DIAGNOSIS — I359 Nonrheumatic aortic valve disorder, unspecified: Secondary | ICD-10-CM | POA: Diagnosis not present

## 2016-01-05 DIAGNOSIS — I4892 Unspecified atrial flutter: Secondary | ICD-10-CM

## 2016-01-05 LAB — POCT INR: INR: 3

## 2016-01-11 ENCOUNTER — Encounter: Payer: Self-pay | Admitting: Cardiovascular Disease

## 2016-02-02 ENCOUNTER — Ambulatory Visit (INDEPENDENT_AMBULATORY_CARE_PROVIDER_SITE_OTHER): Payer: Medicaid Other | Admitting: *Deleted

## 2016-02-02 DIAGNOSIS — I359 Nonrheumatic aortic valve disorder, unspecified: Secondary | ICD-10-CM | POA: Diagnosis not present

## 2016-02-02 DIAGNOSIS — I4892 Unspecified atrial flutter: Secondary | ICD-10-CM

## 2016-02-02 DIAGNOSIS — Z5181 Encounter for therapeutic drug level monitoring: Secondary | ICD-10-CM | POA: Diagnosis not present

## 2016-02-02 DIAGNOSIS — Z952 Presence of prosthetic heart valve: Secondary | ICD-10-CM | POA: Diagnosis not present

## 2016-02-02 LAB — POCT INR: INR: 3.4

## 2016-02-23 ENCOUNTER — Ambulatory Visit (INDEPENDENT_AMBULATORY_CARE_PROVIDER_SITE_OTHER): Payer: Medicaid Other | Admitting: *Deleted

## 2016-02-23 DIAGNOSIS — I4892 Unspecified atrial flutter: Secondary | ICD-10-CM

## 2016-02-23 DIAGNOSIS — I359 Nonrheumatic aortic valve disorder, unspecified: Secondary | ICD-10-CM | POA: Diagnosis not present

## 2016-02-23 DIAGNOSIS — Z5181 Encounter for therapeutic drug level monitoring: Secondary | ICD-10-CM | POA: Diagnosis not present

## 2016-02-23 DIAGNOSIS — Z952 Presence of prosthetic heart valve: Secondary | ICD-10-CM | POA: Diagnosis not present

## 2016-02-23 LAB — POCT INR: INR: 2.8

## 2016-03-21 ENCOUNTER — Telehealth: Payer: Self-pay | Admitting: Cardiovascular Disease

## 2016-03-21 NOTE — Telephone Encounter (Signed)
I placed call to pt and spoke with her. She reports recent hospitalization at [facility]. LFT's were elevated. Told she was allergic to Vancomycin.  Was also "talking out of head" at that time due to liver issues. She reports since then she has felt tired. Is having problems with jaw swelling. Having dizzy spells.  Feels heart skipping at times.  Worse the last few days. Short of breath with exertion.  Swelling in feet/ankles.  Has been taking extra lasix but doesn't think it is working.  Weight was 164 lbs yesterday and is 172 lbs today.  I told pt she needed to go to ED for evaluation.  She will call EMS if unable to get family to transport her.

## 2016-03-21 NOTE — Telephone Encounter (Signed)
Left message to call back  

## 2016-03-21 NOTE — Telephone Encounter (Signed)
Agree. cdm 

## 2016-03-21 NOTE — Telephone Encounter (Signed)
Lindsay Bonilla is calling because her mother is having swelling in her feet and legs . Also her face is puffy as well . Please call

## 2016-03-22 ENCOUNTER — Ambulatory Visit (INDEPENDENT_AMBULATORY_CARE_PROVIDER_SITE_OTHER): Payer: Medicaid Other | Admitting: *Deleted

## 2016-03-22 DIAGNOSIS — Z5181 Encounter for therapeutic drug level monitoring: Secondary | ICD-10-CM | POA: Diagnosis not present

## 2016-03-22 DIAGNOSIS — I359 Nonrheumatic aortic valve disorder, unspecified: Secondary | ICD-10-CM

## 2016-03-22 DIAGNOSIS — Z952 Presence of prosthetic heart valve: Secondary | ICD-10-CM | POA: Diagnosis not present

## 2016-03-22 DIAGNOSIS — I4892 Unspecified atrial flutter: Secondary | ICD-10-CM | POA: Diagnosis not present

## 2016-03-22 LAB — POCT INR: INR: 2.8

## 2016-03-28 ENCOUNTER — Ambulatory Visit (INDEPENDENT_AMBULATORY_CARE_PROVIDER_SITE_OTHER): Payer: Medicaid Other | Admitting: Cardiovascular Disease

## 2016-03-28 ENCOUNTER — Encounter: Payer: Self-pay | Admitting: Cardiovascular Disease

## 2016-03-28 VITALS — BP 146/90 | HR 84 | Ht 64.0 in | Wt 170.2 lb

## 2016-03-28 DIAGNOSIS — F17201 Nicotine dependence, unspecified, in remission: Secondary | ICD-10-CM | POA: Diagnosis not present

## 2016-03-28 DIAGNOSIS — I712 Thoracic aortic aneurysm, without rupture, unspecified: Secondary | ICD-10-CM

## 2016-03-28 DIAGNOSIS — E7800 Pure hypercholesterolemia, unspecified: Secondary | ICD-10-CM

## 2016-03-28 DIAGNOSIS — I2510 Atherosclerotic heart disease of native coronary artery without angina pectoris: Secondary | ICD-10-CM | POA: Diagnosis not present

## 2016-03-28 DIAGNOSIS — I5033 Acute on chronic diastolic (congestive) heart failure: Secondary | ICD-10-CM

## 2016-03-28 DIAGNOSIS — I359 Nonrheumatic aortic valve disorder, unspecified: Secondary | ICD-10-CM

## 2016-03-28 DIAGNOSIS — I10 Essential (primary) hypertension: Secondary | ICD-10-CM

## 2016-03-28 MED ORDER — FUROSEMIDE 40 MG PO TABS: 80.0000 mg | ORAL_TABLET | Freq: Two times a day (BID) | ORAL | 5 refills | Status: DC

## 2016-03-28 NOTE — Patient Instructions (Signed)
Medication Instructions:  Your physician has recommended you make the following change in your medication:  Increase furosemide to 80 mg by mouth twice daily.    Labwork: Lab work to be done today and in 83 days--BMET.    Testing/Procedures: none  Follow-Up: Your physician recommends that you schedule a follow-up appointment in: 3-4 weeks with NP or PA.    Any Other Special Instructions Will Be Listed Below (If Applicable).     If you need a refill on your cardiac medications before your next appointment, please call your pharmacy.

## 2016-03-28 NOTE — Progress Notes (Signed)
Chief Complaint  Patient presents with  . Follow-up    6 months. C/O of swelling    History of Present Illness: 56 yo female with history of bicuspid aortic valve s/p Ross procedure in 1998 with thoracic aortic aneurysm and now s/p Bentall procedure in April 2017 who is here today for cardiac follow up. She also has O2 dependent COPD, GERD, anxiety and tobacco abuse. She has been followed for aortic valve insufficiency and thoracic aortic aneurysm and underwent Bentall procedure per Dr. Cyndia Bent on 06/15/15.  Cardiac cath March 2017 with mild CAD (20% RCA stenosis). Echo June 2017 with normally functioning mechanical aortic valve replacement, normal LV systolic function. Cardiac monitor June 2017 with PACs, PVCs. She has chronic diastolic CHF and is on Lasix.  Admitted to [facility] this month with elevated LFTs and felt to have a reaction to doxycycline.   She is here today for follow up.  She is still having trouble with weight gain and dyspnea. No chest pain. She is having some LE edema. Weight is up to 170 lbs from 160 lbs.    Primary Care Physician: [name], MD  Past Medical History:  Diagnosis Date  . Anxiety   . Aortic aneurysm ([place])   . Aortic insufficiency    a. s/p Bentall with mechanical AVR 4/17 >> FU Echo 6/17: Mild LVH, EF 60-65%, normal wall motion, ventricular septum with paradoxical septal motion, St. Jude mechanical AVR functioning  normally with no regurgitation (mean 6 mmHg), MAC, trivial MR, mild RVE, trivial TR  . Aortic stenosis, severe    Bicuspid valve  . Arthritis   . Asthma   . Bronchitis   . CHF (congestive heart failure) ([place])   . COPD (chronic obstructive pulmonary disease) ([place])   . DDD (degenerative disc disease), cervical   . Depression   . Dysrhythmia    palpitations  . GERD (gastroesophageal reflux disease)   . Hard of hearing   . History of pneumonia   . 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 Migraine  headache   . Palpitations   . PONV (postoperative nausea and vomiting)    "only once"  . Shortness of breath    Nodule left lung CT done 8/6  . Stress incontinence   . Urinary frequency    due to Lasix  . Valvular heart disease     Past Surgical History:  Procedure Laterality Date  . ABDOMINAL HYSTERECTOMY    . ANTERIOR CERVICAL DECOMP/DISCECTOMY FUSION  10/05/2011   Procedure: ANTERIOR CERVICAL DECOMPRESSION/DISCECTOMY FUSION 2 LEVELS;  Surgeon: Floyce Stakes, MD;  Location: [facility];  Service: Neurosurgery;  Laterality: N/A;  Cervical five - six , six - seven Anterior cervical decompression/diskectomy/fusion/plate  . ANTERIOR CRUCIATE LIGAMENT REPAIR Right   . AORTIC VALVE REPLACEMENT    . ARTHROSCOPIC REPAIR ACL  rt  . BENTALL PROCEDURE N/A 06/15/2015   Procedure: BENTALL PROCEDURE; HEMI-ARCH REPAIR WITH #23 ST JUDE MECHANICAL AVR CONDUIT AND #28 HEMASHIELD PLATINUM GRAFT;  Surgeon: Gaye Pollack, MD;  Location: [facility];  Service: Open Heart Surgery;  Laterality: N/A;  . CARDIAC CATHETERIZATION N/A 05/21/2015   Procedure: Right/Left Heart Cath and Coronary Angiography;  Surgeon: [name], MD;  Location: [facility];  Service: Cardiovascular;  Laterality: N/A;  . CHOLECYSTECTOMY    . COLONOSCOPY  10/13/05  . DIAGNOSTIC LAPAROSCOPY    . JOINT REPLACEMENT Right 2016  . KNEE ARTHROSCOPY  rt x4  x1 lft  . MYRINGOPLASTY W/ FAT GRAFT     graft from behind ear  . SIGMOIDOSCOPY  10/13/05, 09/19/05  . TEE WITHOUT CARDIOVERSION N/A 06/15/2015   Procedure: TRANSESOPHAGEAL ECHOCARDIOGRAM (TEE);  Surgeon: Gaye Pollack, MD;  Location: [facility];  Service: Open Heart Surgery;  Laterality: N/A;  . TONSILLECTOMY  2005  . TUBAL LIGATION    . TYMPANOSTOMY TUBE PLACEMENT      Current Outpatient Prescriptions  Medication Sig Dispense Refill  . albuterol (PROVENTIL) (2.5 MG/3ML) 0.083% nebulizer solution Take 2.5 mg by nebulization every 6 (six) hours as needed for wheezing  or shortness of breath.    Marland Kitchen albuterol (VENTOLIN HFA) 108 (90 BASE) MCG/ACT inhaler Inhale 1 puff into the lungs 4 (four) times daily as needed. For shortness of breath    . ALPRAZolam (XANAX) 1 MG tablet Take 1 mg by mouth 4 (four) times daily as needed. For anxiety    . amoxicillin (AMOXIL) 500 MG tablet Take 4 tablets by mouth 30-60 minutes prior to dental appointment. 8 tablet 4  . aspirin EC 81 MG tablet Take 1 tablet (81 mg total) by mouth daily.    . budesonide-formoterol (SYMBICORT) 160-4.5 MCG/ACT inhaler Inhale 2 puffs into the lungs 2 (two) times daily.    Marland Kitchen buPROPion (WELLBUTRIN SR) 150 MG 12 hr tablet Take 150 mg by mouth 2 (two) times daily.    . cetirizine (ZYRTEC) 10 MG tablet Take 10 mg by mouth every morning.     . diltiazem (CARDIZEM CD) 360 MG 24 hr capsule Take 1 capsule (360 mg total) by mouth daily. 90 capsule 3  . EPINEPHrine (EPIPEN) 0.3 mg/0.3 mL DEVI Inject 0.3 mg into the muscle as needed. For allergic reaction    . linaclotide (LINZESS) 145 MCG CAPS capsule Take 145 mcg by mouth daily before breakfast.    . montelukast (SINGULAIR) 10 MG tablet Take 10 mg by mouth at bedtime.     . Olopatadine HCl (PATADAY) 0.2 % SOLN Place 1 drop into both eyes as needed (FOR ALLERGIES).     Marland Kitchen omeprazole (PRILOSEC) 20 MG capsule Take 20 mg by mouth 2 (two) times daily before a meal.    . oxyCODONE-acetaminophen (PERCOCET) 10-325 MG tablet Take 1 tablet by mouth every 4 (four) hours as needed for pain.    Marland Kitchen oxymorphone (OPANA ER) 20 MG 12 hr tablet Take 20 mg by mouth every 12 (twelve) hours as needed for pain.    . potassium chloride SA (K-DUR,KLOR-CON) 20 MEQ tablet Take 2 tablets (40 mEq total) by mouth 2 (two) times daily. 120 tablet 3  . pravastatin (PRAVACHOL) 40 MG tablet Take 1 tablet (40 mg total) by mouth every evening. 90 tablet 3  . PRESCRIPTION MEDICATION Inject as directed every 30 (thirty) days. Allergy Injections    . sertraline (ZOLOFT) 100 MG tablet Take 100 mg by  mouth daily.    . SUMAtriptan (IMITREX) 100 MG tablet Take 100 mg by mouth as needed for migraine (patient states the bottle doesnt have a time based frequency). For migraines    . Tiotropium Bromide Monohydrate (SPIRIVA RESPIMAT) 1.25 MCG/ACT AERS Inhale 2 sprays into the lungs daily.     Marland Kitchen warfarin (COUMADIN) 5 MG tablet Take 5 mg by mouth daily.    . furosemide (LASIX) 40 MG tablet Take 2 tablets (80 mg total) by mouth 2 (two) times daily. 120 tablet 5   No current facility-administered medications for this visit.  Allergies  Allergen Reactions  . Beta Adrenergic Blockers Anaphylaxis  . Doxycycline Other (See Comments)    "Swelling, dizziness, sleepy, talking out of my head, almost caused liver failure"  . Peanut-Containing Drug Products Anaphylaxis, Shortness Of Breath, Swelling and Other (See Comments)    Swelling of the throat  . Shrimp [Shellfish Allergy] Anaphylaxis  . Vancomycin Swelling    Rash and severe swelling  . Avelox [Moxifloxacin Hcl In Nacl] Rash  . Ciprofloxacin Nausea And Vomiting  . Cleocin [Clindamycin Hcl] Rash  . Moxifloxacin Rash  . Sulfamethoxazole-Trimethoprim Rash  . Tape Other (See Comments)    White clear itches, rash    Social History   Social History  . Marital status: Legally Separated    Spouse name: N/A  . Number of children: N/A  . Years of education: N/A   Occupational History  . Not on file.   Social History Main Topics  . Smoking status: Former Smoker    Packs/day: 0.10    Years: 30.00    Types: Cigarettes    Quit date: 04/29/2015  . Smokeless tobacco: Never Used  . Alcohol use No  . Drug use: No  . Sexual activity: No   Other Topics Concern  . Not on file   Social History Narrative  . No narrative on file    Family History  Problem Relation Age of Onset  . Heart attack Father   . Cancer Mother     Type unknown  . Diabetes      family history  . Asthma Brother   . Hyperlipidemia      family history    Review  of Systems:  As stated in the HPI and otherwise negative.   BP (!) 146/90 (BP Location: Right Arm)   Pulse 84   Ht 5' 4"  (1.626 m)   Wt 170 lb 3.2 oz (77.2 kg)   BMI 29.21 kg/m   Physical Examination: General: Well developed, well nourished, NAD  HEENT: OP clear, mucus membranes moist  SKIN: warm, dry. No rashes. Neuro: No focal deficits  Musculoskeletal: Muscle strength 5/5 all ext  Psychiatric: Mood and affect normal  Neck: No JVD, no carotid bruits, no thyromegaly, no lymphadenopathy.  Lungs:Clear bilaterally, no wheezes, rhonci, crackles Cardiovascular: Regular rate and rhythm. Systolic murmur. No gallops or rubs. Abdomen:Soft. Bowel sounds present. Non-tender.  Extremities: No lower extremity edema. Pulses are 2 + in the bilateral DP/PT.  Echo 07/30/15: Left ventricle: The cavity size was normal. Wall thickness was   increased in a pattern of mild LVH. Systolic function was normal.   The estimated ejection fraction was in the range of 60% to 65%.   Wall motion was normal; there were no regional wall motion   abnormalities. - Ventricular septum: Somewhat paradoxical septal motion with   intermittent respiratory flattening. - Aortic valve: Status post placement of 28 mm Hemashield graft. A   St. Jude Medical mechanical prosthesis was present. There was no   significant regurgitation. Mean gradient (S): 6 mm Hg. Peak   gradient (S): 13 mm Hg. - Mitral valve: Calcified annulus. Mildly thickened leaflets .   There was trivial regurgitation. - Right ventricle: Systolic function was mildly reduced. - Tricuspid valve: There was trivial regurgitation. - Pulmonic valve: Poorly visualized. Transvalvular gradient normal   range. Patient reported to be status post Ross procedure. - Pulmonary arteries: Systolic pressure could not be accurately   estimated. - Pericardium, extracardiac: There was no pericardial effusion.  Impressions:  -  Mild LVH with LVEF 60-65%. Grossly normal  diastolic function.   Somewhat paradoxical septal motion with intermittent respiratory   flattening. MAC with mildly thickened mitral leaflets and trivial   mitral regurgitation. St. Jude aortic prosthesis with grossly   normal function and no significant aortic regurgitation. Mildly   reduced RV contraction. Pulmonic valve poorly visualized, but   transvalvular gradient is normal range. Unable to assess PASP.  EKG:  EKG is ordered today. The ekg ordered today demonstrates NSR, rate 84 bpm. Non-specific ST abnormality.   Recent Labs: 06/16/2015: Magnesium 2.0 06/21/2015: Hemoglobin 8.7; Platelets 288 10/22/2015: ALT 15; BUN 12; Creat 0.85; Potassium 3.4; Sodium 138   Lipid Panel    Component Value Date/Time   CHOL 178 10/22/2015 0933   TRIG 97 10/22/2015 0933   HDL 67 10/22/2015 0933   CHOLHDL 2.7 10/22/2015 0933   VLDL 19 10/22/2015 0933   LDLCALC 92 10/22/2015 0933   LDLDIRECT 79.0 03/25/2014 0947     Wt Readings from Last 3 Encounters:  03/28/16 170 lb 3.2 oz (77.2 kg)  10/22/15 160 lb 12.8 oz (72.9 kg)  07/23/15 163 lb (73.9 kg)     Other studies Reviewed: Additional studies/ records that were reviewed today include: . Review of the above records demonstrates:    Assessment and Plan:   1. Aortic valve disease: s/p mechanical AVR April 2017. Functioning well by echo June 2017. She is on coumadin.   2. Thoracic aortic aneurysm: S/p Bentall procedure April 2017.   3. Tobacco abuse, in remission: She has stopped smoking.   4. Palpitations: PACs, PVCs noted on monitor June 2017. She did have post-op atrial flutter. She is on diltiazem.   5. HLD: on statin.  6. Chronic diastolic CHF: She is on Lasix. Volume status is up today. Her weight is up 10 lbs since last visit. Some dyspnea. Will increase Lasix to 80 mg po BID. Check BMET today and in one week.    7. CAD: Mild RCA plaque by cath 2017. Continue ASA and statin.   8. HTN: BP is controlled. No changes.     Current medicines are reviewed at length with the patient today.  The patient does not have concerns regarding medicines.  The following changes have been made:  no change  Labs/ tests ordered today include:   Orders Placed This Encounter  Procedures  . Basic Metabolic Panel (BMET)  . Basic Metabolic Panel (BMET)  . EKG 12-Lead    Disposition:   FU with me in 12 months  Signed, Lauree Chandler, MD 03/28/2016 9:17 AM    [facility] [address], [place], [place]  [address] Phone: [phone]; Fax: [phone]

## 2016-03-29 LAB — BASIC METABOLIC PANEL
BUN / CREAT RATIO: 13 (ref 9–23)
BUN: 12 mg/dL (ref 6–24)
CALCIUM: 9.5 mg/dL (ref 8.7–10.2)
CO2: 30 mmol/L — ABNORMAL HIGH (ref 18–29)
CREATININE: 0.93 mg/dL (ref 0.57–1.00)
Chloride: 93 mmol/L — ABNORMAL LOW (ref 96–106)
GFR calc non Af Amer: 70 mL/min/{1.73_m2} (ref >59)
GFR, EST AFRICAN AMERICAN: 81 mL/min/{1.73_m2} (ref >59)
GLUCOSE: 87 mg/dL (ref 65–99)
Potassium: 4.3 mmol/L (ref 3.5–5.2)
Sodium: 139 mmol/L (ref 134–144)

## 2016-04-07 ENCOUNTER — Other Ambulatory Visit: Payer: Medicaid Other | Admitting: *Deleted

## 2016-04-07 DIAGNOSIS — I5033 Acute on chronic diastolic (congestive) heart failure: Secondary | ICD-10-CM

## 2016-04-07 LAB — BASIC METABOLIC PANEL
BUN / CREAT RATIO: 15 (ref 9–23)
BUN: 12 mg/dL (ref 6–24)
CALCIUM: 9.1 mg/dL (ref 8.7–10.2)
CO2: 28 mmol/L (ref 18–29)
Chloride: 98 mmol/L (ref 96–106)
Creatinine, Ser: 0.78 mg/dL (ref 0.57–1.00)
GFR, EST AFRICAN AMERICAN: 100 mL/min/{1.73_m2} (ref >59)
GFR, EST NON AFRICAN AMERICAN: 86 mL/min/{1.73_m2} (ref >59)
Glucose: 88 mg/dL (ref 65–99)
Potassium: 4 mmol/L (ref 3.5–5.2)
Sodium: 142 mmol/L (ref 134–144)

## 2016-04-16 ENCOUNTER — Other Ambulatory Visit: Payer: Self-pay | Admitting: Cardiology

## 2016-04-26 ENCOUNTER — Ambulatory Visit (INDEPENDENT_AMBULATORY_CARE_PROVIDER_SITE_OTHER): Payer: Medicaid Other | Admitting: *Deleted

## 2016-04-26 DIAGNOSIS — Z952 Presence of prosthetic heart valve: Secondary | ICD-10-CM

## 2016-04-26 DIAGNOSIS — I4892 Unspecified atrial flutter: Secondary | ICD-10-CM

## 2016-04-26 DIAGNOSIS — Z5181 Encounter for therapeutic drug level monitoring: Secondary | ICD-10-CM | POA: Diagnosis not present

## 2016-04-26 DIAGNOSIS — I359 Nonrheumatic aortic valve disorder, unspecified: Secondary | ICD-10-CM | POA: Diagnosis not present

## 2016-04-26 LAB — POCT INR: INR: 2.1

## 2016-05-05 ENCOUNTER — Ambulatory Visit (INDEPENDENT_AMBULATORY_CARE_PROVIDER_SITE_OTHER): Payer: Medicaid Other | Admitting: Physician Assistant

## 2016-05-05 ENCOUNTER — Encounter: Payer: Self-pay | Admitting: Physician Assistant

## 2016-05-05 VITALS — BP 140/64 | HR 82 | Ht 64.0 in | Wt 160.1 lb

## 2016-05-05 DIAGNOSIS — I4892 Unspecified atrial flutter: Secondary | ICD-10-CM | POA: Diagnosis not present

## 2016-05-05 DIAGNOSIS — I5032 Chronic diastolic (congestive) heart failure: Secondary | ICD-10-CM | POA: Diagnosis not present

## 2016-05-05 DIAGNOSIS — I712 Thoracic aortic aneurysm, without rupture, unspecified: Secondary | ICD-10-CM

## 2016-05-05 DIAGNOSIS — I359 Nonrheumatic aortic valve disorder, unspecified: Secondary | ICD-10-CM

## 2016-05-05 NOTE — Patient Instructions (Signed)
Medication Instructions:  Your physician recommends that you continue on your current medications as directed. Please refer to the Current Medication list given to you today.   Labwork: TODAY:  BMET  Testing/Procedures: None ordered  Follow-Up: Your physician wants you to follow-up in: 4-6 MONTHS WITH DR. [name] You will receive a reminder letter in the mail two months in advance. If you don't receive a letter, please call our office to schedule the follow-up appointment.   Any Other Special Instructions Will Be Listed Below (If Applicable).     If you need a refill on your cardiac medications before your next appointment, please call your pharmacy.

## 2016-05-05 NOTE — Progress Notes (Signed)
Cardiology Office Note    Date:  05/05/2016   ID:  Lindsay Bonilla, DOB [birth year], MRN [id number]  PCP:  Lindsay Bogus, MD  Cardiologist: Dr. Angelena Bonilla  Chief Complaint  Patient presents with  . 3/4 week f/u  . Follow-up    History of Present Illness:  Lindsay Bonilla is a 56 y.o. female  with history of bicuspid aortic valve s/p Ross procedure in 1998 with thoracic aortic aneurysm and now s/p Bentall procedure in April 2017 who is here today for cardiac follow up. She also has O2 dependent COPD, GERD, anxiety and tobacco abuse. She has been followed for aortic valve insufficiency and thoracic aortic aneurysm and underwent Bentall procedure per Dr. Cyndia Bonilla on 06/15/15.  Cardiac cath March 2017 with mild CAD (20% RCA stenosis). Echo June 2017 with normally functioning mechanical aortic valve replacement, normal LV systolic function. Cardiac monitor June 2017 with PACs, PVCs(she did have post op atrial flutter). She has chronic diastolic CHF and is on Lasix.  Admitted to [facility] Jan 2018 with elevated LFTs and felt to have a reaction to doxycycline.  Saw Dr.McAlhany with 10 pound weight gain and lower extremity edema 03/28/16. Lasix was increased to 80 mg twice a day and she is here today for follow-up. She is feeling much better on this dose of Lasix. She denies any further edema, chest pain, dizziness or presyncope. She has chronic dyspnea on exertion secondary to her COPD.   Past Medical History:  Diagnosis Date  . Anxiety   . Aortic aneurysm ([place])   . Aortic insufficiency    a. s/p Bentall with mechanical AVR 4/17 >> FU Echo 6/17: Mild LVH, EF 60-65%, normal wall motion, ventricular septum with paradoxical septal motion, St. Jude mechanical AVR functioning  normally with no regurgitation (mean 6 mmHg), MAC, trivial MR, mild RVE, trivial TR  . Aortic stenosis, severe    Bicuspid valve  . Arthritis   . Asthma   . Bronchitis   . CHF (congestive heart failure) ([place])   . COPD  (chronic obstructive pulmonary disease) ([place])   . DDD (degenerative disc disease), cervical   . Depression   . Dysrhythmia    palpitations  . GERD (gastroesophageal reflux disease)   . Hard of hearing   . History of pneumonia   . 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 Migraine headache   . Palpitations   . PONV (postoperative nausea and vomiting)    "only once"  . Shortness of breath    Nodule left lung CT done 8/6  . Stress incontinence   . Urinary frequency    due to Lasix  . Valvular heart disease     Past Surgical History:  Procedure Laterality Date  . ABDOMINAL HYSTERECTOMY    . ANTERIOR CERVICAL DECOMP/DISCECTOMY FUSION  10/05/2011   Procedure: ANTERIOR CERVICAL DECOMPRESSION/DISCECTOMY FUSION 2 LEVELS;  Surgeon: Floyce Stakes, MD;  Location: [facility];  Service: Neurosurgery;  Laterality: N/A;  Cervical five - six , six - seven Anterior cervical decompression/diskectomy/fusion/plate  . ANTERIOR CRUCIATE LIGAMENT REPAIR Right   . AORTIC VALVE REPLACEMENT    . ARTHROSCOPIC REPAIR ACL  rt  . BENTALL PROCEDURE N/A 06/15/2015   Procedure: BENTALL PROCEDURE; HEMI-ARCH REPAIR WITH #23 ST JUDE MECHANICAL AVR CONDUIT AND #28 HEMASHIELD PLATINUM GRAFT;  Surgeon: Lindsay Pollack, MD;  Location: [place];  Service: Open Heart Surgery;  Laterality: N/A;  . CARDIAC CATHETERIZATION N/A 05/21/2015   Procedure: Right/Left  Heart Cath and Coronary Angiography;  Surgeon: Lindsay Blanks, MD;  Location: [facility];  Service: Cardiovascular;  Laterality: N/A;  . CHOLECYSTECTOMY    . COLONOSCOPY  10/13/05  . DIAGNOSTIC LAPAROSCOPY    . JOINT REPLACEMENT Right 2016  . KNEE ARTHROSCOPY     rt x4  x1 lft  . MYRINGOPLASTY W/ FAT GRAFT     graft from behind ear  . SIGMOIDOSCOPY  10/13/05, 09/19/05  . TEE WITHOUT CARDIOVERSION N/A 06/15/2015   Procedure: TRANSESOPHAGEAL ECHOCARDIOGRAM (TEE);  Surgeon: Lindsay Pollack, MD;  Location: [facility];  Service: Open Heart  Surgery;  Laterality: N/A;  . TONSILLECTOMY  2005  . TUBAL LIGATION    . TYMPANOSTOMY TUBE PLACEMENT      Current Medications: Outpatient Medications Prior to Visit  Medication Sig Dispense Refill  . albuterol (PROVENTIL) (2.5 MG/3ML) 0.083% nebulizer solution Take 2.5 mg by nebulization every 6 (six) hours as needed for wheezing or shortness of breath.    Marland Kitchen albuterol (VENTOLIN HFA) 108 (90 BASE) MCG/ACT inhaler Inhale 1 puff into the lungs 4 (four) times daily as needed. For shortness of breath    . ALPRAZolam (XANAX) 1 MG tablet Take 1 mg by mouth 4 (four) times daily as needed. For anxiety    . amoxicillin (AMOXIL) 500 MG tablet Take 4 tablets by mouth 30-60 minutes prior to dental appointment. 8 tablet 4  . aspirin EC 81 MG tablet Take 1 tablet (81 mg total) by mouth daily.    . budesonide-formoterol (SYMBICORT) 160-4.5 MCG/ACT inhaler Inhale 2 puffs into the lungs 2 (two) times daily.    Marland Kitchen buPROPion (WELLBUTRIN SR) 150 MG 12 hr tablet Take 150 mg by mouth 2 (two) times daily.    . cetirizine (ZYRTEC) 10 MG tablet Take 10 mg by mouth every morning.     . diltiazem (CARDIZEM CD) 360 MG 24 hr capsule Take 1 capsule (360 mg total) by mouth daily. 90 capsule 3  . EPINEPHrine (EPIPEN) 0.3 mg/0.3 mL DEVI Inject 0.3 mg into the muscle as needed. For allergic reaction    . montelukast (SINGULAIR) 10 MG tablet Take 10 mg by mouth at bedtime.     . Olopatadine HCl (PATADAY) 0.2 % SOLN Place 1 drop into both eyes as needed (FOR ALLERGIES).     Marland Kitchen omeprazole (PRILOSEC) 20 MG capsule Take 20 mg by mouth 2 (two) times daily before a meal.    . oxyCODONE-acetaminophen (PERCOCET) 10-325 MG tablet Take 1 tablet by mouth every 4 (four) hours as needed for pain.    Marland Kitchen oxymorphone (OPANA ER) 20 MG 12 hr tablet Take 20 mg by mouth every 12 (twelve) hours as needed for pain.    . pravastatin (PRAVACHOL) 40 MG tablet Take 1 tablet (40 mg total) by mouth every evening. 90 tablet 3  . PRESCRIPTION MEDICATION  Inject as directed every 30 (thirty) days. Allergy Injections    . sertraline (ZOLOFT) 100 MG tablet Take 100 mg by mouth daily.    . SUMAtriptan (IMITREX) 100 MG tablet Take 100 mg by mouth as needed for migraine (patient states the bottle doesnt have a time based frequency). For migraines    . Tiotropium Bromide Monohydrate (SPIRIVA RESPIMAT) 1.25 MCG/ACT AERS Inhale 2 sprays into the lungs daily.     Marland Kitchen warfarin (COUMADIN) 5 MG tablet Take 1 tablet daily or as directed 35 tablet 4  . furosemide (LASIX) 40 MG tablet Take 2 tablets (80 mg total) by mouth 2 (  two) times daily. 120 tablet 5  . linaclotide (LINZESS) 145 MCG CAPS capsule Take 145 mcg by mouth daily before breakfast.    . potassium chloride SA (K-DUR,KLOR-CON) 20 MEQ tablet Take 2 tablets (40 mEq total) by mouth 2 (two) times daily. 120 tablet 3   No facility-administered medications prior to visit.      Allergies:   Beta adrenergic blockers; Doxycycline; Peanut-containing drug products; Shrimp [shellfish allergy]; Vancomycin; Avelox [moxifloxacin hcl in nacl]; Ciprofloxacin; Cleocin [clindamycin hcl]; Moxifloxacin; Sulfamethoxazole-trimethoprim; and Tape   Social History   Social History  . Marital status: Legally Separated    Spouse name: N/A  . Number of children: N/A  . Years of education: N/A   Social History Main Topics  . Smoking status: Former Smoker    Packs/day: 0.10    Years: 30.00    Types: Cigarettes    Quit date: 04/29/2015  . Smokeless tobacco: Never Used  . Alcohol use No  . Drug use: No  . Sexual activity: No   Other Topics Concern  . None   Social History Narrative  . None     Family History:  The patient's family history includes Asthma in her brother; Cancer in her mother; Heart attack in her father.   ROS:   Please see the history of present illness.    Review of Systems  Constitution: Negative.  HENT: Negative.   Eyes: Negative.   Cardiovascular: Positive for dyspnea on exertion and  irregular heartbeat.  Respiratory: Negative.   Hematologic/Lymphatic: Negative.   Musculoskeletal: Negative.  Negative for joint pain.  Gastrointestinal: Negative.   Genitourinary: Negative.   Neurological: Negative.    All other systems reviewed and are negative.   PHYSICAL EXAM:   VS:  BP 140/64   Pulse 82   Ht _0  (1.626 m)   Wt 160 lb 1.9 oz (72.6 kg)   SpO2 97%   BMI 27.48 kg/m   Physical Exam  GEN: Well nourished, well developed, in no acute distress  Neck: no JVD, carotid bruits, or masses Cardiac:RRR;Crisp valvular click, 2/6 systolic murmur at the right sternal border Respiratory: His breath sounds but clear to auscultation bilaterally, normal work of breathing GI: soft, nontender, nondistended, + BS Ext: without cyanosis, clubbing, or edema, Good distal pulses bilaterally MS: no deformity or atrophy  Skin: warm and dry, no rash Neuro:  Alert and Oriented x 3, Strength and sensation are intact Psych: euthymic mood, full affect  Wt Readings from Last 3 Encounters:  05/05/16 160 lb 1.9 oz (72.6 kg)  03/28/16 170 lb 3.2 oz (77.2 kg)  10/22/15 160 lb 12.8 oz (72.9 kg)      Studies/Labs Reviewed:   EKG:  EKG is not ordered today.    Recent Labs: 06/16/2015: Magnesium 2.0 06/21/2015: Hemoglobin 8.7; Platelets 288 10/22/2015: ALT 15 04/07/2016: BUN 12; Creatinine, Ser 0.78; Potassium 4.0; Sodium 142   Lipid Panel    Component Value Date/Time   CHOL 178 10/22/2015 0933   TRIG 97 10/22/2015 0933   HDL 67 10/22/2015 0933   CHOLHDL 2.7 10/22/2015 0933   VLDL 19 10/22/2015 0933   LDLCALC 92 10/22/2015 0933   LDLDIRECT 79.0 03/25/2014 0947    Additional studies/ records that were reviewed today include:  Echo 07/30/15: Left ventricle: The cavity size was normal. Wall thickness was   increased in a pattern of mild LVH. Systolic function was normal.   The estimated ejection fraction was in the range of 60% to 65%.  Wall motion was normal; there were no regional  wall motion   abnormalities. - Ventricular septum: Somewhat paradoxical septal motion with   intermittent respiratory flattening. - Aortic valve: Status post placement of 28 mm Hemashield graft. A   St. Jude Medical mechanical prosthesis was present. There was no   significant regurgitation. Mean gradient (S): 6 mm Hg. Peak   gradient (S): 13 mm Hg. - Mitral valve: Calcified annulus. Mildly thickened leaflets .   There was trivial regurgitation. - Right ventricle: Systolic function was mildly reduced. - Tricuspid valve: There was trivial regurgitation. - Pulmonic valve: Poorly visualized. Transvalvular gradient normal   range. Patient reported to be status post Ross procedure. - Pulmonary arteries: Systolic pressure could not be accurately   estimated. - Pericardium, extracardiac: There was no pericardial effusion.   Impressions:   - Mild LVH with LVEF 60-65%. Grossly normal diastolic function.   Somewhat paradoxical septal motion with intermittent respiratory   flattening. MAC with mildly thickened mitral leaflets and trivial   mitral regurgitation. St. Jude aortic prosthesis with grossly   normal function and no significant aortic regurgitation. Mildly   reduced RV contraction. Pulmonic valve poorly visualized, but   transvalvular gradient is normal range. Unable to assess PASP.     ASSESSMENT:    1. Chronic diastolic CHF (congestive heart failure) ([place])   2. Aortic valve disease   3. Thoracic aortic aneurysm without rupture ([place])   4. Atrial flutter, unspecified type ([place])      PLAN:  In order of problems listed above:  Chronic diastolic CHF compensated. Patient has lost the 10 pounds that she had gained. Continue Lasix 80 mg twice a day. Continue current dose of potassium. Check bmet today. F/u with Dr. Angelena Bonilla in 4-6 months or sooner if needed.  Aortic valve disease status post mechanical aVR 05/2015 functioning well by echo 07/2015 on Coumadin  Thoracic aortic  aneurysm S/P Bentall procedure 05/2015  Atrophic flutter postop on diltiazem. PACs and PVCs on monitor in June/2017    Medication Adjustments/Labs and Tests Ordered: Current medicines are reviewed at length with the patient today.  Concerns regarding medicines are outlined above.  Medication changes, Labs and Tests ordered today are listed in the Patient Instructions below. There are no Patient Instructions on file for this visit.   Signed, Ermalinda Barrios, PA-C  05/05/2016 1:16 PM    [facility] [place], [place], [place]  [address] Phone: [phone]; Fax: [phone]

## 2016-05-06 ENCOUNTER — Telehealth: Payer: Self-pay | Admitting: *Deleted

## 2016-05-06 DIAGNOSIS — E876 Hypokalemia: Secondary | ICD-10-CM

## 2016-05-06 LAB — BASIC METABOLIC PANEL
BUN/Creatinine Ratio: 11 (ref 9–23)
BUN: 8 mg/dL (ref 6–24)
CALCIUM: 9.1 mg/dL (ref 8.7–10.2)
CHLORIDE: 94 mmol/L — AB (ref 96–106)
CO2: 31 mmol/L — ABNORMAL HIGH (ref 18–29)
CREATININE: 0.72 mg/dL (ref 0.57–1.00)
GFR calc non Af Amer: 95 mL/min/{1.73_m2} (ref >59)
GFR, EST AFRICAN AMERICAN: 110 mL/min/{1.73_m2} (ref >59)
GLUCOSE: 83 mg/dL (ref 65–99)
Potassium: 3.4 mmol/L — ABNORMAL LOW (ref 3.5–5.2)
Sodium: 139 mmol/L (ref 134–144)

## 2016-05-06 MED ORDER — POTASSIUM CHLORIDE CRYS ER 20 MEQ PO TBCR: EXTENDED_RELEASE_TABLET | ORAL | 11 refills | Status: DC

## 2016-05-06 NOTE — Telephone Encounter (Signed)
Spoke with pt re: increasing Potassium to 2 extra tablets today only and going back to her original dose of Potassium 20 meq 4 tablets by mouth bid.  Will repeat BMET 05/19/16.  Pt verbalized understanding.

## 2016-06-02 ENCOUNTER — Encounter: Payer: Self-pay | Admitting: Family Medicine

## 2016-06-07 ENCOUNTER — Ambulatory Visit (INDEPENDENT_AMBULATORY_CARE_PROVIDER_SITE_OTHER): Payer: Medicaid Other | Admitting: *Deleted

## 2016-06-07 DIAGNOSIS — Z952 Presence of prosthetic heart valve: Secondary | ICD-10-CM | POA: Diagnosis not present

## 2016-06-07 DIAGNOSIS — Z5181 Encounter for therapeutic drug level monitoring: Secondary | ICD-10-CM

## 2016-06-07 DIAGNOSIS — I4892 Unspecified atrial flutter: Secondary | ICD-10-CM

## 2016-06-07 DIAGNOSIS — I359 Nonrheumatic aortic valve disorder, unspecified: Secondary | ICD-10-CM

## 2016-06-07 LAB — POCT INR: INR: 2.4

## 2016-06-09 ENCOUNTER — Telehealth: Payer: Self-pay | Admitting: Cardiovascular Disease

## 2016-06-09 NOTE — Telephone Encounter (Signed)
I spoke with pt who reports her blood pressure has been elevated recently. Not sure how long.  Saw gynecologist last week and it was elevated at office visit.  Told to follow up with cardiology.  Pt states she has been having headaches for awhile.  Watching diet and not eating salty foods.  I scheduled her to see Dr. [name] tomorrow at 12:00.  She will bring her BP cuff and BP readings to this office visit.

## 2016-06-09 NOTE — Telephone Encounter (Signed)
New Message     Pt is having problem with her bp being high  Pt c/o BP issue: STAT if pt c/o blurred vision, one-sided weakness or slurred speech  1. What are your last 5 BP readings? 159/90 160/101  2. Are you having any other symptoms (ex. Dizziness, headache, blurred vision, passed out)? Headache   3. What is your BP issue? Her ob gyn said for her to call you and so did her PCP

## 2016-06-10 ENCOUNTER — Encounter: Payer: Self-pay | Admitting: Cardiovascular Disease

## 2016-06-10 ENCOUNTER — Ambulatory Visit (INDEPENDENT_AMBULATORY_CARE_PROVIDER_SITE_OTHER): Payer: Medicaid Other | Admitting: Cardiovascular Disease

## 2016-06-10 VITALS — BP 148/96 | HR 88 | Ht 64.0 in | Wt 161.8 lb

## 2016-06-10 DIAGNOSIS — I5032 Chronic diastolic (congestive) heart failure: Secondary | ICD-10-CM

## 2016-06-10 DIAGNOSIS — I10 Essential (primary) hypertension: Secondary | ICD-10-CM | POA: Diagnosis not present

## 2016-06-10 DIAGNOSIS — I712 Thoracic aortic aneurysm, without rupture, unspecified: Secondary | ICD-10-CM

## 2016-06-10 DIAGNOSIS — I359 Nonrheumatic aortic valve disorder, unspecified: Secondary | ICD-10-CM

## 2016-06-10 MED ORDER — LOSARTAN POTASSIUM 25 MG PO TABS: 25.0000 mg | ORAL_TABLET | Freq: Every day | ORAL | 3 refills | Status: DC

## 2016-06-10 NOTE — Patient Instructions (Addendum)
Medication Instructions:  Your physician has recommended you make the following change in your medication:  Start Cozaar 25 mg by mouth daily   Labwork: Your physician recommends that you return for lab work in: one week--April 20,2018.  The lab is open from 7:30 AM until 4:45 PM   Testing/Procedures: none  Follow-Up: Your physician recommends that you schedule a follow-up appointment in: 6 months.  Please call our office in about 3 months to schedule this appointment.     Any Other Special Instructions Will Be Listed Below (If Applicable).     If you need a refill on your cardiac medications before your next appointment, please call your pharmacy.

## 2016-06-10 NOTE — Progress Notes (Signed)
Chief Complaint  Patient presents with  . Hypertension    History of Present Illness: 56 yo female with history of bicuspid aortic valve s/p Ross procedure in 1998 with thoracic aortic aneurysm and now s/p Bentall procedure in April 2017 who is here today for cardiac follow up. She also has O2 dependent COPD, GERD, anxiety and tobacco abuse. She has been followed for aortic valve insufficiency and thoracic aortic aneurysm and underwent Bentall procedure per Dr. Cyndia Bent on 06/15/15.  Cardiac cath March 2017 with mild CAD (20% RCA stenosis). Echo June 2017 with normally functioning mechanical aortic valve replacement, normal LV systolic function. Cardiac monitor June 2017 with PACs, PVCs. She has chronic diastolic CHF and is on Lasix.  Admitted to [facility] January 2018 with elevated LFTs and felt to have a reaction to doxycycline. I saw her in January 2018 and she had 10lb weight gain, LE edema and dyspnea. Lasix as increased to 80 mg po BID and she had a good response to this. She was seen here in follow-up 05/05/16 and was feeling much better.   She is here today for follow up. She has been having headaches. Her BP has been elevated at home. The patient denies any chest pain, dyspnea, palpitations, lower extremity edema, orthopnea, PND, dizziness, near syncope or syncope.    Primary Care Physician: [name], MD  Past Medical History:  Diagnosis Date  . Anxiety   . Aortic aneurysm ([place])   . Aortic insufficiency    a. s/p Bentall with mechanical AVR 4/17 >> FU Echo 6/17: Mild LVH, EF 60-65%, normal wall motion, ventricular septum with paradoxical septal motion, St. Jude mechanical AVR functioning  normally with no regurgitation (mean 6 mmHg), MAC, trivial MR, mild RVE, trivial TR  . Aortic stenosis, severe    Bicuspid valve  . Arthritis   . Asthma   . Bronchitis   . CHF (congestive heart failure) ([place])   . COPD (chronic obstructive pulmonary disease) ([place])   . DDD  (degenerative disc disease), cervical   . Depression   . Dysrhythmia    palpitations  . GERD (gastroesophageal reflux disease)   . Hard of hearing   . History of pneumonia   . 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 Migraine headache   . Palpitations   . PONV (postoperative nausea and vomiting)    "only once"  . Shortness of breath    Nodule left lung CT done 8/6  . Stress incontinence   . Urinary frequency    due to Lasix  . Valvular heart disease     Past Surgical History:  Procedure Laterality Date  . ABDOMINAL HYSTERECTOMY    . ANTERIOR CERVICAL DECOMP/DISCECTOMY FUSION  10/05/2011   Procedure: ANTERIOR CERVICAL DECOMPRESSION/DISCECTOMY FUSION 2 LEVELS;  Surgeon: Floyce Stakes, MD;  Location: [facility];  Service: Neurosurgery;  Laterality: N/A;  Cervical five - six , six - seven Anterior cervical decompression/diskectomy/fusion/plate  . ANTERIOR CRUCIATE LIGAMENT REPAIR Right   . AORTIC VALVE REPLACEMENT    . ARTHROSCOPIC REPAIR ACL  rt  . BENTALL PROCEDURE N/A 06/15/2015   Procedure: BENTALL PROCEDURE; HEMI-ARCH REPAIR WITH #23 ST JUDE MECHANICAL AVR CONDUIT AND #28 HEMASHIELD PLATINUM GRAFT;  Surgeon: Gaye Pollack, MD;  Location: [facility];  Service: Open Heart Surgery;  Laterality: N/A;  . CARDIAC CATHETERIZATION N/A 05/21/2015   Procedure: Right/Left Heart Cath and Coronary Angiography;  Surgeon: [name], MD;  Location: [facility]  INVASIVE CV LAB;  Service: Cardiovascular;  Laterality: N/A;  . CHOLECYSTECTOMY    . COLONOSCOPY  10/13/05  . DIAGNOSTIC LAPAROSCOPY    . JOINT REPLACEMENT Right 2016  . KNEE ARTHROSCOPY     rt x4  x1 lft  . MYRINGOPLASTY W/ FAT GRAFT     graft from behind ear  . SIGMOIDOSCOPY  10/13/05, 09/19/05  . TEE WITHOUT CARDIOVERSION N/A 06/15/2015   Procedure: TRANSESOPHAGEAL ECHOCARDIOGRAM (TEE);  Surgeon: Gaye Pollack, MD;  Location: [facility];  Service: Open Heart Surgery;  Laterality: N/A;  . TONSILLECTOMY  2005  .  TUBAL LIGATION    . TYMPANOSTOMY TUBE PLACEMENT      Current Outpatient Prescriptions  Medication Sig Dispense Refill  . albuterol (PROVENTIL) (2.5 MG/3ML) 0.083% nebulizer solution Take 2.5 mg by nebulization every 6 (six) hours as needed for wheezing or shortness of breath.    Marland Kitchen albuterol (VENTOLIN HFA) 108 (90 BASE) MCG/ACT inhaler Inhale 1 puff into the lungs 4 (four) times daily as needed. For shortness of breath    . ALPRAZolam (XANAX) 1 MG tablet Take 1 mg by mouth 4 (four) times daily as needed. For anxiety    . amoxicillin (AMOXIL) 500 MG tablet Take 4 tablets by mouth 30-60 minutes prior to dental appointment. 8 tablet 4  . aspirin EC 81 MG tablet Take 1 tablet (81 mg total) by mouth daily.    . budesonide-formoterol (SYMBICORT) 160-4.5 MCG/ACT inhaler Inhale 2 puffs into the lungs 2 (two) times daily.    Marland Kitchen buPROPion (WELLBUTRIN SR) 150 MG 12 hr tablet Take 150 mg by mouth 2 (two) times daily.    . cetirizine (ZYRTEC) 10 MG tablet Take 10 mg by mouth every morning.     . diltiazem (CARDIZEM CD) 360 MG 24 hr capsule Take 1 capsule (360 mg total) by mouth daily. 90 capsule 3  . EPINEPHrine (EPIPEN) 0.3 mg/0.3 mL DEVI Inject 0.3 mg into the muscle as needed. For allergic reaction    . Ferrous Gluconate (IRON 27 PO) Take 1 tablet by mouth daily.    . furosemide (LASIX) 80 MG tablet Take 80 mg by mouth 2 (two) times daily.    . montelukast (SINGULAIR) 10 MG tablet Take 10 mg by mouth at bedtime.     . Olopatadine HCl (PATADAY) 0.2 % SOLN Place 1 drop into both eyes as needed (FOR ALLERGIES).     Marland Kitchen omeprazole (PRILOSEC) 20 MG capsule Take 20 mg by mouth 2 (two) times daily before a meal.    . oxyCODONE (OXYCONTIN) 20 mg 12 hr tablet Take 20 mg by mouth every 12 (twelve) hours.    Marland Kitchen oxyCODONE-acetaminophen (PERCOCET) 10-325 MG tablet Take 1 tablet by mouth every 4 (four) hours as needed for pain.    . potassium chloride SA (K-DUR,KLOR-CON) 20 MEQ tablet Take 80 mEq by mouth 2 (two) times  daily.    . pravastatin (PRAVACHOL) 40 MG tablet Take 1 tablet (40 mg total) by mouth every evening. 90 tablet 3  . PRESCRIPTION MEDICATION Inject as directed every 30 (thirty) days. Allergy Injections    . sertraline (ZOLOFT) 100 MG tablet Take 100 mg by mouth daily.    . SUMAtriptan (IMITREX) 100 MG tablet Take 100 mg by mouth as needed for migraine (patient states the bottle doesnt have a time based frequency). For migraines    . Tiotropium Bromide Monohydrate (SPIRIVA RESPIMAT) 1.25 MCG/ACT AERS Inhale 2 sprays into the lungs daily.     Marland Kitchen warfarin (  COUMADIN) 5 MG tablet Take 1 tablet daily or as directed 35 tablet 4  . losartan (COZAAR) 25 MG tablet Take 1 tablet (25 mg total) by mouth daily. 90 tablet 3   No current facility-administered medications for this visit.     Allergies  Allergen Reactions  . Beta Adrenergic Blockers Anaphylaxis  . Doxycycline Other (See Comments)    "Swelling, dizziness, sleepy, talking out of my head, almost caused liver failure"  . Peanut-Containing Drug Products Anaphylaxis, Shortness Of Breath, Swelling and Other (See Comments)    Swelling of the throat  . Shrimp [Shellfish Allergy] Anaphylaxis  . Vancomycin Swelling    Rash and severe swelling  . Avelox [Moxifloxacin Hcl In Nacl] Rash  . Ciprofloxacin Nausea And Vomiting  . Cleocin [Clindamycin Hcl] Rash  . Moxifloxacin Rash  . Sulfamethoxazole-Trimethoprim Rash  . Tape Other (See Comments)    White clear itches, rash    Social History   Social History  . Marital status: Legally Separated    Spouse name: N/A  . Number of children: N/A  . Years of education: N/A   Occupational History  . Not on file.   Social History Main Topics  . Smoking status: Former Smoker    Packs/day: 0.10    Years: 30.00    Types: Cigarettes    Quit date: 04/29/2015  . Smokeless tobacco: Never Used  . Alcohol use No  . Drug use: No  . Sexual activity: No   Other Topics Concern  . Not on file   Social  History Narrative  . No narrative on file    Family History  Problem Relation Age of Onset  . Heart attack Father   . Cancer Mother     Type unknown  . Diabetes      family history  . Asthma Brother   . Hyperlipidemia      family history    Review of Systems:  As stated in the HPI and otherwise negative.   BP (!) 148/96   Pulse 88   Ht _0  (1.626 m)   Wt 161 lb 12.8 oz (73.4 kg)   SpO2 96%   BMI 27.77 kg/m   Physical Examination: General: Well developed, well nourished, NAD  HEENT: OP clear, mucus membranes moist  SKIN: warm, dry. No rashes. Neuro: No focal deficits  Musculoskeletal: Muscle strength 5/5 all ext  Psychiatric: Mood and affect normal  Neck: No JVD, no carotid bruits, no thyromegaly, no lymphadenopathy.  Lungs:Clear bilaterally, no wheezes, rhonci, crackles Cardiovascular: Regular rate and rhythm. No murmurs, gallops or rubs. Abdomen:Soft. Bowel sounds present. Non-tender.  Extremities: No lower extremity edema. Pulses are 2 + in the bilateral DP/PT.   Echo 07/30/15: Left ventricle: The cavity size was normal. Wall thickness was   increased in a pattern of mild LVH. Systolic function was normal.   The estimated ejection fraction was in the range of 60% to 65%.   Wall motion was normal; there were no regional wall motion   abnormalities. - Ventricular septum: Somewhat paradoxical septal motion with   intermittent respiratory flattening. - Aortic valve: Status post placement of 28 mm Hemashield graft. A   St. Jude Medical mechanical prosthesis was present. There was no   significant regurgitation. Mean gradient (S): 6 mm Hg. Peak   gradient (S): 13 mm Hg. - Mitral valve: Calcified annulus. Mildly thickened leaflets .   There was trivial regurgitation. - Right ventricle: Systolic function was mildly reduced. - Tricuspid valve: There  was trivial regurgitation. - Pulmonic valve: Poorly visualized. Transvalvular gradient normal   range. Patient reported  to be status post Ross procedure. - Pulmonary arteries: Systolic pressure could not be accurately   estimated. - Pericardium, extracardiac: There was no pericardial effusion.  Impressions:  - Mild LVH with LVEF 60-65%. Grossly normal diastolic function.   Somewhat paradoxical septal motion with intermittent respiratory   flattening. MAC with mildly thickened mitral leaflets and trivial   mitral regurgitation. St. Jude aortic prosthesis with grossly   normal function and no significant aortic regurgitation. Mildly   reduced RV contraction. Pulmonic valve poorly visualized, but   transvalvular gradient is normal range. Unable to assess PASP.  EKG:  EKG is not ordered today. The ekg ordered today demonstrates   Recent Labs: 06/16/2015: Magnesium 2.0 06/21/2015: Hemoglobin 8.7; Platelets 288 10/22/2015: ALT 15 05/05/2016: BUN 8; Creatinine, Ser 0.72; Potassium 3.4; Sodium 139   Lipid Panel    Component Value Date/Time   CHOL 178 10/22/2015 0933   TRIG 97 10/22/2015 0933   HDL 67 10/22/2015 0933   CHOLHDL 2.7 10/22/2015 0933   VLDL 19 10/22/2015 0933   LDLCALC 92 10/22/2015 0933   LDLDIRECT 79.0 03/25/2014 0947     Wt Readings from Last 3 Encounters:  06/10/16 161 lb 12.8 oz (73.4 kg)  05/05/16 160 lb 1.9 oz (72.6 kg)  03/28/16 170 lb 3.2 oz (77.2 kg)     Other studies Reviewed: Additional studies/ records that were reviewed today include: . Review of the above records demonstrates:    Assessment and Plan:   1. Aortic valve disease: She is s/p mechanical AVR in 2017 and is on coumadin. Antibiotic prophylaxis is indicated.    2. Thoracic aortic aneurysm: She is s/p Bentall procedure 2017.    3. Tobacco abuse, in remission: She is no longer smoking.    4. Palpitations: PACs, PVCs noted on monitor June 2017. She did have post-op atrial flutter. She is on diltiazem. No recent palpitations.   5. HLD: She is on a statin.  6. Chronic diastolic CHF: Volume status is much  better on higher dose of Lasix. Will continue Lasix 80 mg po BID and potassium supplementation.     7. CAD: Mild RCA plaque by cath 2017. Continue ASA and statin.   8. HTN: BP is elevated today. Will add Cozaar 25 mg daily. Continue Cardizem. Will check BMET in one week.     Current medicines are reviewed at length with the patient today.  The patient does not have concerns regarding medicines.  The following changes have been made:  no change  Labs/ tests ordered today include:   Orders Placed This Encounter  Procedures  . Basic Metabolic Panel (BMET)    Disposition:   FU with me in 12 months  Signed, Lauree Chandler, MD 06/10/2016 12:57 PM    [address], [place], [place]  [address] Phone: [phone]; Fax: [phone]

## 2016-06-13 ENCOUNTER — Encounter: Payer: Self-pay | Admitting: Cardiovascular Disease

## 2016-06-13 ENCOUNTER — Telehealth: Payer: Self-pay

## 2016-06-13 NOTE — Telephone Encounter (Signed)
**Note De-Identified Keshav Winegar Obfuscation** I did PA for Losartan over the phone with [employer]. I s/w Monique who advised me that Losartan has just became a preferred medication and will not require a PA after this one. Per Monique Losartan has been approved. PA# [id number]

## 2016-06-17 ENCOUNTER — Other Ambulatory Visit: Payer: Medicaid Other

## 2016-06-21 ENCOUNTER — Telehealth: Payer: Self-pay | Admitting: *Deleted

## 2016-06-21 ENCOUNTER — Other Ambulatory Visit: Payer: Medicaid Other | Admitting: *Deleted

## 2016-06-21 LAB — BASIC METABOLIC PANEL
BUN / CREAT RATIO: 11 (ref 9–23)
BUN: 7 mg/dL (ref 6–24)
CO2: 29 mmol/L (ref 18–29)
Calcium: 8.9 mg/dL (ref 8.7–10.2)
Chloride: 94 mmol/L — ABNORMAL LOW (ref 96–106)
Creatinine, Ser: 0.64 mg/dL (ref 0.57–1.00)
GFR, EST AFRICAN AMERICAN: 117 mL/min/{1.73_m2} (ref >59)
GFR, EST NON AFRICAN AMERICAN: 101 mL/min/{1.73_m2} (ref >59)
Glucose: 93 mg/dL (ref 65–99)
POTASSIUM: 3.1 mmol/L — AB (ref 3.5–5.2)
Sodium: 140 mmol/L (ref 134–144)

## 2016-06-21 NOTE — Telephone Encounter (Signed)
Pt was in office today and asked to speak with me.  She reports she may need cataract surgery.  Has not seen provider yet.  I told her to have doctor send [place] clearance letter if surgery planned. She reports problem swallowing potassium.  Reports pharmacy used to fill with smaller tablets and she was able to swallow those.  I told pt she could dissolve potassium and that I would also contact pharmacy to see if smaller tablets could be substituted. I spoke with pharmacist at [facility] and they can fill with smaller tablets but would be 10 meq tablets.  They will change prescription and make pt aware she will now need to take more tablets (previous tablets were 20 meq).

## 2016-06-22 ENCOUNTER — Telehealth: Payer: Self-pay | Admitting: Cardiovascular Disease

## 2016-06-22 NOTE — Telephone Encounter (Signed)
PT AWARE OF LAB RESULTS./CY 

## 2016-06-22 NOTE — Telephone Encounter (Signed)
New message    Pt is calling to return call about blood work.

## 2016-07-21 ENCOUNTER — Ambulatory Visit (INDEPENDENT_AMBULATORY_CARE_PROVIDER_SITE_OTHER): Payer: Medicaid Other | Admitting: *Deleted

## 2016-07-21 DIAGNOSIS — Z5181 Encounter for therapeutic drug level monitoring: Secondary | ICD-10-CM

## 2016-07-21 DIAGNOSIS — I359 Nonrheumatic aortic valve disorder, unspecified: Secondary | ICD-10-CM | POA: Diagnosis not present

## 2016-07-21 DIAGNOSIS — Z952 Presence of prosthetic heart valve: Secondary | ICD-10-CM | POA: Diagnosis not present

## 2016-07-21 DIAGNOSIS — I4892 Unspecified atrial flutter: Secondary | ICD-10-CM | POA: Diagnosis not present

## 2016-07-21 LAB — POCT INR: INR: 2

## 2016-08-09 ENCOUNTER — Other Ambulatory Visit: Payer: Self-pay | Admitting: Physician Assistant

## 2016-08-09 DIAGNOSIS — I5032 Chronic diastolic (congestive) heart failure: Secondary | ICD-10-CM

## 2016-08-09 MED ORDER — DILTIAZEM HCL ER COATED BEADS 360 MG PO CP24: 360.0000 mg | ORAL_CAPSULE | Freq: Every day | ORAL | 2 refills | Status: DC

## 2016-08-20 ENCOUNTER — Emergency Department (HOSPITAL_COMMUNITY): Payer: Medicaid Other

## 2016-08-20 ENCOUNTER — Encounter (HOSPITAL_COMMUNITY): Payer: Self-pay | Admitting: Emergency Medicine

## 2016-08-20 ENCOUNTER — Emergency Department (HOSPITAL_COMMUNITY)
Admission: EM | Admit: 2016-08-20 | Discharge: 2016-08-20 | Disposition: A | Discharge status: Home / Self Care | Payer: Medicaid Other | Attending: Emergency Medicine | Admitting: Emergency Medicine

## 2016-08-20 DIAGNOSIS — J449 Chronic obstructive pulmonary disease, unspecified: Secondary | ICD-10-CM | POA: Diagnosis not present

## 2016-08-20 DIAGNOSIS — Y929 Unspecified place or not applicable: Secondary | ICD-10-CM | POA: Diagnosis not present

## 2016-08-20 DIAGNOSIS — W06XXXA Fall from bed, initial encounter: Secondary | ICD-10-CM | POA: Diagnosis not present

## 2016-08-20 DIAGNOSIS — I5032 Chronic diastolic (congestive) heart failure: Secondary | ICD-10-CM | POA: Diagnosis not present

## 2016-08-20 DIAGNOSIS — Z7901 Long term (current) use of anticoagulants: Secondary | ICD-10-CM | POA: Diagnosis not present

## 2016-08-20 DIAGNOSIS — Z7982 Long term (current) use of aspirin: Secondary | ICD-10-CM | POA: Diagnosis not present

## 2016-08-20 DIAGNOSIS — S300XXA Contusion of lower back and pelvis, initial encounter: Secondary | ICD-10-CM | POA: Diagnosis not present

## 2016-08-20 DIAGNOSIS — G8929 Other chronic pain: Secondary | ICD-10-CM | POA: Insufficient documentation

## 2016-08-20 DIAGNOSIS — Y999 Unspecified external cause status: Secondary | ICD-10-CM | POA: Diagnosis not present

## 2016-08-20 DIAGNOSIS — W19XXXA Unspecified fall, initial encounter: Secondary | ICD-10-CM

## 2016-08-20 DIAGNOSIS — Y939 Activity, unspecified: Secondary | ICD-10-CM | POA: Insufficient documentation

## 2016-08-20 DIAGNOSIS — J45909 Unspecified asthma, uncomplicated: Secondary | ICD-10-CM | POA: Insufficient documentation

## 2016-08-20 DIAGNOSIS — Z87891 Personal history of nicotine dependence: Secondary | ICD-10-CM | POA: Diagnosis not present

## 2016-08-20 DIAGNOSIS — S3992XA Unspecified injury of lower back, initial encounter: Secondary | ICD-10-CM | POA: Diagnosis present

## 2016-08-20 DIAGNOSIS — Z79899 Other long term (current) drug therapy: Secondary | ICD-10-CM | POA: Insufficient documentation

## 2016-08-20 LAB — CBC WITH DIFFERENTIAL/PLATELET
BASOS ABS: 0 10*3/uL (ref 0.0–0.1)
Basophils Relative: 0 %
Eosinophils Absolute: 0.2 10*3/uL (ref 0.0–0.7)
Eosinophils Relative: 3 %
HEMATOCRIT: 30.8 % — AB (ref 36.0–46.0)
HEMOGLOBIN: 10.4 g/dL — AB (ref 12.0–15.0)
LYMPHS PCT: 24 %
Lymphs Abs: 2.1 10*3/uL (ref 0.7–4.0)
MCH: 30.1 pg (ref 26.0–34.0)
MCHC: 33.8 g/dL (ref 30.0–36.0)
MCV: 89.3 fL (ref 78.0–100.0)
Monocytes Absolute: 0.6 10*3/uL (ref 0.1–1.0)
Monocytes Relative: 7 %
NEUTROS ABS: 5.6 10*3/uL (ref 1.7–7.7)
Neutrophils Relative %: 66 %
Platelets: 202 10*3/uL (ref 150–400)
RBC: 3.45 MIL/uL — AB (ref 3.87–5.11)
RDW: 13.6 % (ref 11.5–15.5)
WBC: 8.5 10*3/uL (ref 4.0–10.5)

## 2016-08-20 LAB — PROTIME-INR
INR: 2.98
Prothrombin Time: 31.6 seconds — ABNORMAL HIGH (ref 11.4–15.2)

## 2016-08-20 NOTE — ED Provider Notes (Addendum)
[facility] DEPT Provider Note   CSN: [id number] Arrival date & time: 08/20/16  1310     History   Chief Complaint Chief Complaint  Patient presents with  . Fall    HPI Lindsay Bonilla is a 56 y.o. female.HPI Patient fell when she slipped getting onto the step of her bed 2 days ago. She complains of pain at her right buttock radiating to her pubic area onset the following day she denies hip pain. She does complain of pain at the presacral area however suffers from chronic low back pain. She's been treating herself with oxycodone with partial relief. No other associated symptoms. She noticed today that her right buttock was ecchymotic, hard and more painful pain is improved with not placing any pressure on the air made worse with lying on the area. No other associated symptoms Past Medical History:  Diagnosis Date  . Anxiety   . Aortic aneurysm ([place])   . Aortic insufficiency    a. s/p [place] with mechanical AVR 4/17 >> FU Echo 6/17: Mild LVH, EF 60-65%, normal wall motion, ventricular septum with paradoxical septal motion, [employer] mechanical AVR functioning  normally with no regurgitation (mean 6 mmHg), MAC, trivial MR, mild RVE, trivial TR  . Aortic stenosis, severe    Bicuspid valve  . Arthritis   . Asthma   . Bronchitis   . CHF (congestive heart failure) ([place])   . COPD (chronic obstructive pulmonary disease) ([place])   . DDD (degenerative disc disease), cervical   . Depression   . Dysrhythmia    palpitations  . GERD (gastroesophageal reflux disease)   . Hard of hearing   . History of pneumonia   . 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 Migraine headache   . Palpitations   . PONV (postoperative nausea and vomiting)    "only once"  . Shortness of breath    Nodule left lung CT done 8/6  . Stress incontinence   . Urinary frequency    due to Lasix  . Valvular heart disease     Patient Active Problem List   Diagnosis Date Noted  . Chronic  diastolic CHF (congestive heart failure) ([place]) 05/05/2016  . Hyperlipidemia 07/10/2015  . Encounter for therapeutic drug monitoring 06/25/2015  . Aortic valve replaced 06/25/2015  . Atrial flutter ([place]) 06/21/2015  . S/P aortic valve replacement 06/15/2015  . S/P aortic valve replacement and aortoplasty 06/15/2015  . Aortic valve disease   . CAD (coronary artery disease), native coronary artery 08/29/2012  . TOBACCO USER 02/25/2009  . Aneurysm of thoracic aorta ([place]) 02/25/2009  . DYSPNEA ON EXERTION 08/29/2008  . MIGRAINE HEADACHE 08/21/2008  . VALVULAR HEART DISEASE 08/21/2008  . BRONCHITIS 08/21/2008  . ASTHMA 08/21/2008  . COPD (chronic obstructive pulmonary disease) ([place]) 08/21/2008  . ARTHRITIS 08/21/2008  . PALPITATIONS 08/21/2008    Past Surgical History:  Procedure Laterality Date  . ABDOMINAL HYSTERECTOMY    . ANTERIOR CERVICAL DECOMP/DISCECTOMY FUSION  10/05/2011   Procedure: ANTERIOR CERVICAL DECOMPRESSION/DISCECTOMY FUSION 2 LEVELS;  Surgeon: Floyce Stakes, MD;  Location: [facility];  Service: Neurosurgery;  Laterality: N/A;  Cervical five - six , six - seven Anterior cervical decompression/diskectomy/fusion/plate  . ANTERIOR CRUCIATE LIGAMENT REPAIR Right   . AORTIC VALVE REPLACEMENT    . ARTHROSCOPIC REPAIR ACL  rt  . BENTALL PROCEDURE N/A 06/15/2015   Procedure: BENTALL PROCEDURE; HEMI-ARCH REPAIR WITH #23 ST JUDE MECHANICAL AVR CONDUIT AND #28 HEMASHIELD PLATINUM GRAFT;  Surgeon: Gaye Pollack, MD;  Location: [facility];  Service: Open Heart Surgery;  Laterality: N/A;  . CARDIAC CATHETERIZATION N/A 05/21/2015   Procedure: Right/Left Heart Cath and Coronary Angiography;  Surgeon: [name], MD;  Location: [facility];  Service: Cardiovascular;  Laterality: N/A;  . CHOLECYSTECTOMY    . COLONOSCOPY  10/13/05  . DIAGNOSTIC LAPAROSCOPY    . JOINT REPLACEMENT Right 2016  . KNEE ARTHROSCOPY     rt x4  x1 lft  . MYRINGOPLASTY W/ FAT GRAFT     graft  from behind ear  . SIGMOIDOSCOPY  10/13/05, 09/19/05  . TEE WITHOUT CARDIOVERSION N/A 06/15/2015   Procedure: TRANSESOPHAGEAL ECHOCARDIOGRAM (TEE);  Surgeon: Gaye Pollack, MD;  Location: [facility];  Service: Open Heart Surgery;  Laterality: N/A;  . TONSILLECTOMY  2005  . TUBAL LIGATION    . TYMPANOSTOMY TUBE PLACEMENT      OB History    Gravida Para Term Preterm AB Living             3   SAB TAB Ectopic Multiple Live Births                   Home Medications    Prior to Admission medications   Medication Sig Start Date End Date Taking? Authorizing Provider  albuterol (PROVENTIL) (2.5 MG/3ML) 0.083% nebulizer solution Take 2.5 mg by nebulization every 6 (six) hours as needed for wheezing or shortness of breath.   Yes [provider]  albuterol (VENTOLIN HFA) 108 (90 BASE) MCG/ACT inhaler Inhale 1 puff into the lungs 4 (four) times daily as needed. For shortness of breath   Yes [provider]  ALPRAZolam (XANAX) 1 MG tablet Take 1 mg by mouth 4 (four) times daily as needed. For anxiety   Yes [provider]  aspirin EC 81 MG tablet Take 1 tablet (81 mg total) by mouth daily. 07/23/15  Yes Weaver, Scott T, PA-C  budesonide-formoterol (SYMBICORT) 160-4.5 MCG/ACT inhaler Inhale 2 puffs into the lungs 2 (two) times daily.   Yes [provider]  buPROPion (WELLBUTRIN SR) 150 MG 12 hr tablet Take 150 mg by mouth 2 (two) times daily.   Yes [provider]  cetirizine (ZYRTEC) 10 MG tablet Take 10 mg by mouth every morning.    Yes [provider]  diltiazem (CARDIZEM CD) 360 MG 24 hr capsule Take 1 capsule (360 mg total) by mouth daily. 08/09/16  Yes [name], MD  EPINEPHrine (EPIPEN) 0.3 mg/0.3 mL DEVI Inject 0.3 mg into the muscle as needed. For allergic reaction   Yes [provider]  escitalopram (LEXAPRO) 10 MG tablet Take 10 mg by mouth daily.   Yes [provider]  Ferrous Gluconate (IRON 27 PO) Take 1  tablet by mouth daily.   Yes [provider]  furosemide (LASIX) 80 MG tablet Take 80 mg by mouth 2 (two) times daily.   Yes [provider]  losartan (COZAAR) 25 MG tablet Take 1 tablet (25 mg total) by mouth daily. 06/10/16 09/08/16 Yes [name], MD  montelukast (SINGULAIR) 10 MG tablet Take 10 mg by mouth at bedtime.    Yes [provider]  Olopatadine HCl (PATADAY) 0.2 % SOLN Place 1 drop into both eyes as needed (FOR ALLERGIES).    Yes [provider]  omeprazole (PRILOSEC) 20 MG capsule Take 20 mg by mouth 2 (two) times daily before a meal.   Yes [provider]  oxyCODONE (OXYCONTIN) 20 mg 12 hr tablet Take 20 mg by mouth every 12 (twelve) hours.   Yes [provider]  oxyCODONE-acetaminophen (PERCOCET) 10-325 MG tablet Take 1 tablet by mouth every 4 (four) hours as needed for pain.   Yes [provider]  potassium chloride SA (K-DUR,KLOR-CON) 20 MEQ tablet Take 80 mEq by mouth 2 (two) times daily.   Yes [provider]  pravastatin (PRAVACHOL) 40 MG tablet Take 1 tablet (40 mg total) by mouth every evening. 07/23/15  Yes Weaver, Scott T, PA-C  SUMAtriptan (IMITREX) 100 MG tablet Take 100 mg by mouth as needed for migraine (patient states the bottle doesnt have a time based frequency). For migraines   Yes [provider]  Tiotropium Bromide Monohydrate (SPIRIVA RESPIMAT) 1.25 MCG/ACT AERS Inhale 2 sprays into the lungs daily.    Yes [provider]  warfarin (COUMADIN) 5 MG tablet Take 1 tablet daily or as directed 04/18/16  Yes Satira Sark, MD    Family History Family History  Problem Relation Age of Onset  . Heart attack Father   . Cancer Mother        Type unknown  . Diabetes Unknown        family history  . Asthma Brother   . Hyperlipidemia Unknown        family history    Social History Social History  Substance Use Topics  . Smoking status: Former Smoker     Packs/day: 0.10    Years: 30.00    Types: Cigarettes    Quit date: 04/29/2015  . Smokeless tobacco: Never Used  . Alcohol use No     Allergies   Beta adrenergic blockers; Doxycycline; Peanut-containing drug products; Shrimp [shellfish allergy]; Vancomycin; Avelox [moxifloxacin hcl in nacl]; Ciprofloxacin; Cleocin [clindamycin hcl]; Moxifloxacin; Sulfamethoxazole-trimethoprim; and Tape   Review of Systems Review of Systems  Constitutional: Negative.   HENT: Negative.   Respiratory: Negative.   Cardiovascular: Negative.   Gastrointestinal: Negative.   Musculoskeletal: Positive for back pain and gait problem.       Chronic Back pain, followed in pain clinic.walks with cane  Skin: Negative.   Hematological: Bruises/bleeds easily.  Psychiatric/Behavioral: Negative.   All other systems reviewed and are negative.    Physical Exam Updated Vital Signs BP (!) 107/57   Pulse 69   Temp 98.1 F (36.7 C) (Oral)   Resp 18   Ht _0  (1.626 m)   Wt 72.6 kg (160 lb)   SpO2 97%   BMI 27.46 kg/m   Physical Exam  Constitutional: She appears well-developed and well-nourished.  HENT:  Head: Normocephalic and atraumatic.  Eyes: Conjunctivae are normal. Pupils are equal, round, and reactive to light.  Neck: Neck supple. No tracheal deviation present. No thyromegaly present.  Cardiovascular: Normal rate and regular rhythm.   No murmur heard. Pulmonary/Chest: Effort normal and breath sounds normal.  Abdominal: Soft. Bowel sounds are normal. She exhibits no distension. There is no tenderness.  Musculoskeletal: Normal range of motion. She exhibits no edema or tenderness.  [employer] spine nontender Pelvis stable. Right buttock has a large hematoma and entire right buttock is ecchymotic, with corresponding tenderness. All 4 extremities without contusion abrasion or tenderness, neurovascularly intact. She has no pain on internal or external rotation of either thigh  Neurological: She is alert.  Coordination normal.  Skin: Skin is warm and dry. No rash noted.  Psychiatric: She has a normal mood and affect.  Nursing note and vitals reviewed.  ED Treatments / Results  Labs (all labs ordered are listed, but only abnormal results are displayed) Labs Reviewed  CBC WITH DIFFERENTIAL/PLATELET - Abnormal; Notable for the following:       Result Value   RBC 3.45 (*)    Hemoglobin 10.4 (*)    HCT 30.8 (*)    All other components within normal limits  PROTIME-INR - Abnormal; Notable for the following:    Prothrombin Time 31.6 (*)    All other components within normal limits    EKG  EKG Interpretation None       Radiology Ct Abdomen Pelvis Wo Contrast  Result Date: 08/20/2016 CLINICAL DATA:  Trauma to right buttocks.  Patient on Coumadin. EXAM: CT ABDOMEN AND PELVIS WITHOUT CONTRAST TECHNIQUE: Multidetector CT imaging of the abdomen and pelvis was performed following the standard protocol without IV contrast. COMPARISON:  February 16, 2016 FINDINGS: Lower chest: No acute abnormality. Hepatobiliary: No focal liver abnormality is seen. Status post cholecystectomy. No biliary dilatation. Pancreas: Unremarkable. No pancreatic ductal dilatation or surrounding inflammatory changes. Spleen: Normal in size without focal abnormality. Adrenals/Urinary Tract: Adrenal glands are unremarkable. Kidneys are normal, without renal calculi, focal lesion, or hydronephrosis. Bladder is unremarkable. Stomach/Bowel: The stomach and small bowel are normal. The colon and appendix are normal. Vascular/Lymphatic: Atherosclerotic changes are seen in the non aneurysmal aorta and iliac vessels. No adenopathy. Reproductive: Status post hysterectomy. No adnexal masses. Other: There is a hematoma in the right buttocks consistent with history measuring 8.9 by 2.8 by 6.4 cm. This hematoma demonstrates attenuation of 50 Hounsfield units consistent with acute to subacute blood. No active extravasation is seen. Increased  attenuation in the subcutaneous fat extending from the level of L3 through the bottom of the buttocks is consistent with trauma and bruising. Musculoskeletal: No acute or significant osseous findings. IMPRESSION: 1. Large hematoma and surrounding bruising in the right buttocks extending into the lower back. 2. Atherosclerotic changes. 3. No other acute abnormalities. Aortic Atherosclerosis (ICD10-I70.0). Electronically Signed   By: Dorise Bullion III M.D   On: 08/20/2016 16:18    Procedures Procedures (including critical care time)  Medications Ordered in ED Medications - No data to display  Results for orders placed or performed during the hospital encounter of 08/20/16  CBC with Differential/Platelet  Result Value Ref Range   WBC 8.5 4.0 - 10.5 K/uL   RBC 3.45 (L) 3.87 - 5.11 MIL/uL   Hemoglobin 10.4 (L) 12.0 - 15.0 g/dL   HCT 30.8 (L) 36.0 - 46.0 %   MCV 89.3 78.0 - 100.0 fL   MCH 30.1 26.0 - 34.0 pg   MCHC 33.8 30.0 - 36.0 g/dL   RDW 13.6 11.5 - 15.5 %   Platelets 202 150 - 400 K/uL   Neutrophils Relative % 66 %   Neutro Abs 5.6 1.7 - 7.7 K/uL   Lymphocytes Relative 24 %   Lymphs Abs 2.1 0.7 - 4.0 K/uL   Monocytes Relative 7 %   Monocytes Absolute 0.6 0.1 - 1.0 K/uL   Eosinophils Relative 3 %   Eosinophils Absolute 0.2 0.0 - 0.7 K/uL   Basophils Relative 0 %   Basophils Absolute 0.0 0.0 - 0.1 K/uL  Protime-INR  Result Value Ref Range   Prothrombin Time 31.6 (H) 11.4 - 15.2 seconds   INR 2.98    Ct Abdomen Pelvis Wo Contrast  Result Date: 08/20/2016 CLINICAL DATA:  Trauma to right buttocks.  Patient on Coumadin. EXAM: CT ABDOMEN AND PELVIS WITHOUT CONTRAST TECHNIQUE:  Multidetector CT imaging of the abdomen and pelvis was performed following the standard protocol without IV contrast. COMPARISON:  February 16, 2016 FINDINGS: Lower chest: No acute abnormality. Hepatobiliary: No focal liver abnormality is seen. Status post cholecystectomy. No biliary dilatation. Pancreas:  Unremarkable. No pancreatic ductal dilatation or surrounding inflammatory changes. Spleen: Normal in size without focal abnormality. Adrenals/Urinary Tract: Adrenal glands are unremarkable. Kidneys are normal, without renal calculi, focal lesion, or hydronephrosis. Bladder is unremarkable. Stomach/Bowel: The stomach and small bowel are normal. The colon and appendix are normal. Vascular/Lymphatic: Atherosclerotic changes are seen in the non aneurysmal aorta and iliac vessels. No adenopathy. Reproductive: Status post hysterectomy. No adnexal masses. Other: There is a hematoma in the right buttocks consistent with history measuring 8.9 by 2.8 by 6.4 cm. This hematoma demonstrates attenuation of 50 Hounsfield units consistent with acute to subacute blood. No active extravasation is seen. Increased attenuation in the subcutaneous fat extending from the level of L3 through the bottom of the buttocks is consistent with trauma and bruising. Musculoskeletal: No acute or significant osseous findings. IMPRESSION: 1. Large hematoma and surrounding bruising in the right buttocks extending into the lower back. 2. Atherosclerotic changes. 3. No other acute abnormalities. Aortic Atherosclerosis (ICD10-I70.0). Electronically Signed   By: Dorise Bullion III M.D   On: 08/20/2016 16:18   Initial Impression / Assessment and Plan / ED Course  I have reviewed the triage vital signs and the nursing notes.  Pertinent labs & imaging results that were available during my care of the patient were reviewed by me and considered in my medical decision making (see chart for details).     4:45 PM patient is alert and ambulates with minimal limp favoring right leg. Not lightheaded on standing. She is mildly anemic however hemoglobin improved over one year ago. Therapeutic on Coumadin Plan she can continue to take oxycodone as needed for pain. Follow-up with PMD as needed Final Clinical Impressions(s) / ED Diagnoses   Diagnosis #1  fall #2 contusion right buttock #3 anemia Final diagnoses:  None    New Prescriptions New Prescriptions   No medications on file     Orlie Dakin, MD 08/20/16 [place]    Orlie Dakin, MD 08/20/16 [id number]

## 2016-08-20 NOTE — ED Triage Notes (Signed)
PT states she slipped getting out of bed x2 days ago in the evening. PT c/o lower back pain, right hip pain and right pelvic pain. PT states she is on coumadin and bruising noted to right buttocks and lower back.

## 2016-08-20 NOTE — Discharge Instructions (Signed)
You can continue to take your oxycodone as needed for bad pain. Or you can take Tylenol for mild pain .You can also place an ice pack on the painful area 4 times daily for 30 minutes at a time. Return if concern for any reason or see your primary care physician

## 2016-08-29 ENCOUNTER — Other Ambulatory Visit: Payer: Self-pay | Admitting: Physician Assistant

## 2016-08-29 DIAGNOSIS — E785 Hyperlipidemia, unspecified: Secondary | ICD-10-CM

## 2016-09-06 ENCOUNTER — Ambulatory Visit (INDEPENDENT_AMBULATORY_CARE_PROVIDER_SITE_OTHER): Payer: Medicaid Other | Admitting: *Deleted

## 2016-09-06 DIAGNOSIS — I359 Nonrheumatic aortic valve disorder, unspecified: Secondary | ICD-10-CM | POA: Diagnosis not present

## 2016-09-06 DIAGNOSIS — I4892 Unspecified atrial flutter: Secondary | ICD-10-CM | POA: Diagnosis not present

## 2016-09-06 DIAGNOSIS — Z5181 Encounter for therapeutic drug level monitoring: Secondary | ICD-10-CM

## 2016-09-06 DIAGNOSIS — Z952 Presence of prosthetic heart valve: Secondary | ICD-10-CM

## 2016-09-06 LAB — POCT INR: INR: 3.3

## 2016-09-19 ENCOUNTER — Other Ambulatory Visit: Payer: Self-pay | Admitting: Cardiology

## 2016-09-26 ENCOUNTER — Other Ambulatory Visit: Payer: Self-pay | Admitting: Cardiovascular Disease

## 2016-09-26 DIAGNOSIS — E785 Hyperlipidemia, unspecified: Secondary | ICD-10-CM

## 2016-10-04 ENCOUNTER — Ambulatory Visit (INDEPENDENT_AMBULATORY_CARE_PROVIDER_SITE_OTHER): Payer: Medicaid Other | Admitting: *Deleted

## 2016-10-04 DIAGNOSIS — Z952 Presence of prosthetic heart valve: Secondary | ICD-10-CM

## 2016-10-04 DIAGNOSIS — Z5181 Encounter for therapeutic drug level monitoring: Secondary | ICD-10-CM

## 2016-10-04 DIAGNOSIS — I4892 Unspecified atrial flutter: Secondary | ICD-10-CM | POA: Diagnosis not present

## 2016-10-04 DIAGNOSIS — I359 Nonrheumatic aortic valve disorder, unspecified: Secondary | ICD-10-CM

## 2016-10-04 LAB — POCT INR: INR: 2.4

## 2016-10-04 MED ORDER — WARFARIN SODIUM 5 MG PO TABS: 5.0000 mg | ORAL_TABLET | Freq: Every day | ORAL | 3 refills | Status: DC

## 2016-11-01 ENCOUNTER — Ambulatory Visit (INDEPENDENT_AMBULATORY_CARE_PROVIDER_SITE_OTHER): Payer: Medicaid Other | Admitting: *Deleted

## 2016-11-01 DIAGNOSIS — Z5181 Encounter for therapeutic drug level monitoring: Secondary | ICD-10-CM

## 2016-11-01 DIAGNOSIS — I4892 Unspecified atrial flutter: Secondary | ICD-10-CM | POA: Diagnosis not present

## 2016-11-01 DIAGNOSIS — Z952 Presence of prosthetic heart valve: Secondary | ICD-10-CM

## 2016-11-01 DIAGNOSIS — I359 Nonrheumatic aortic valve disorder, unspecified: Secondary | ICD-10-CM | POA: Diagnosis not present

## 2016-11-01 LAB — POCT INR: INR: 3.2

## 2016-11-02 ENCOUNTER — Encounter (HOSPITAL_COMMUNITY): Payer: Self-pay | Admitting: Emergency Medicine

## 2016-11-02 ENCOUNTER — Emergency Department (HOSPITAL_COMMUNITY)
Admission: EM | Admit: 2016-11-02 | Discharge: 2016-11-02 | Disposition: A | Discharge status: Home / Self Care | Payer: Medicaid Other | Attending: Emergency Medicine | Admitting: Emergency Medicine

## 2016-11-02 ENCOUNTER — Emergency Department (HOSPITAL_COMMUNITY): Payer: Medicaid Other

## 2016-11-02 DIAGNOSIS — Z7982 Long term (current) use of aspirin: Secondary | ICD-10-CM | POA: Diagnosis not present

## 2016-11-02 DIAGNOSIS — K529 Noninfective gastroenteritis and colitis, unspecified: Secondary | ICD-10-CM | POA: Insufficient documentation

## 2016-11-02 DIAGNOSIS — J449 Chronic obstructive pulmonary disease, unspecified: Secondary | ICD-10-CM | POA: Diagnosis not present

## 2016-11-02 DIAGNOSIS — I2510 Atherosclerotic heart disease of native coronary artery without angina pectoris: Secondary | ICD-10-CM | POA: Diagnosis not present

## 2016-11-02 DIAGNOSIS — Z79899 Other long term (current) drug therapy: Secondary | ICD-10-CM | POA: Insufficient documentation

## 2016-11-02 DIAGNOSIS — R1031 Right lower quadrant pain: Secondary | ICD-10-CM | POA: Diagnosis present

## 2016-11-02 DIAGNOSIS — Z96659 Presence of unspecified artificial knee joint: Secondary | ICD-10-CM | POA: Insufficient documentation

## 2016-11-02 DIAGNOSIS — Z87891 Personal history of nicotine dependence: Secondary | ICD-10-CM | POA: Diagnosis not present

## 2016-11-02 DIAGNOSIS — J45909 Unspecified asthma, uncomplicated: Secondary | ICD-10-CM | POA: Diagnosis not present

## 2016-11-02 DIAGNOSIS — I5032 Chronic diastolic (congestive) heart failure: Secondary | ICD-10-CM | POA: Insufficient documentation

## 2016-11-02 LAB — CBC WITH DIFFERENTIAL/PLATELET
BASOS ABS: 0 10*3/uL (ref 0.0–0.1)
BASOS PCT: 0 %
EOS ABS: 0.1 10*3/uL (ref 0.0–0.7)
EOS PCT: 1 %
HCT: 43.8 % (ref 36.0–46.0)
Hemoglobin: 14 g/dL (ref 12.0–15.0)
LYMPHS PCT: 8 %
Lymphs Abs: 1.2 10*3/uL (ref 0.7–4.0)
MCH: 29.4 pg (ref 26.0–34.0)
MCHC: 32 g/dL (ref 30.0–36.0)
MCV: 92 fL (ref 78.0–100.0)
Monocytes Absolute: 1 10*3/uL (ref 0.1–1.0)
Monocytes Relative: 7 %
Neutro Abs: 12.7 10*3/uL — ABNORMAL HIGH (ref 1.7–7.7)
Neutrophils Relative %: 84 %
PLATELETS: 242 10*3/uL (ref 150–400)
RBC: 4.76 MIL/uL (ref 3.87–5.11)
RDW: 13.6 % (ref 11.5–15.5)
WBC: 15.1 10*3/uL — AB (ref 4.0–10.5)

## 2016-11-02 LAB — BASIC METABOLIC PANEL
ANION GAP: 9 (ref 5–15)
BUN: 14 mg/dL (ref 6–20)
CALCIUM: 8.6 mg/dL — AB (ref 8.9–10.3)
CO2: 31 mmol/L (ref 22–32)
Chloride: 98 mmol/L — ABNORMAL LOW (ref 101–111)
Creatinine, Ser: 0.84 mg/dL (ref 0.44–1.00)
Glucose, Bld: 127 mg/dL — ABNORMAL HIGH (ref 65–99)
POTASSIUM: 3.9 mmol/L (ref 3.5–5.1)
SODIUM: 138 mmol/L (ref 135–145)

## 2016-11-02 LAB — URINALYSIS, ROUTINE W REFLEX MICROSCOPIC
BILIRUBIN URINE: NEGATIVE
GLUCOSE, UA: NEGATIVE mg/dL
Hgb urine dipstick: NEGATIVE
KETONES UR: NEGATIVE mg/dL
LEUKOCYTES UA: NEGATIVE
NITRITE: NEGATIVE
PROTEIN: NEGATIVE mg/dL
Specific Gravity, Urine: >1.046 — ABNORMAL HIGH (ref 1.005–1.030)
pH: 6 (ref 5.0–8.0)

## 2016-11-02 LAB — I-STAT CG4 LACTIC ACID, ED: LACTIC ACID, VENOUS: 2.13 mmol/L — AB (ref 0.5–1.9)

## 2016-11-02 MED ORDER — ACETAMINOPHEN 325 MG PO TABS
650.0000 mg | ORAL_TABLET | Freq: Once | ORAL | Status: AC
  Administered 2016-11-02: 650 mg via ORAL
  Filled 2016-11-02: qty 2

## 2016-11-02 MED ORDER — HYDROMORPHONE HCL 1 MG/ML IJ SOLN
1.0000 mg | Freq: Once | INTRAMUSCULAR | Status: AC
  Administered 2016-11-02: 1 mg via INTRAVENOUS
  Filled 2016-11-02: qty 1

## 2016-11-02 MED ORDER — AMOXICILLIN-POT CLAVULANATE 875-125 MG PO TABS: 1.0000 | ORAL_TABLET | Freq: Two times a day (BID) | ORAL | 0 refills | Status: DC

## 2016-11-02 MED ORDER — HYDROCODONE-ACETAMINOPHEN 5-325 MG PO TABS: 1.0000 | ORAL_TABLET | ORAL | 0 refills | Status: DC | PRN

## 2016-11-02 MED ORDER — ONDANSETRON HCL 4 MG/2ML IJ SOLN
4.0000 mg | Freq: Once | INTRAMUSCULAR | Status: AC
Start: 2016-11-02 — End: 2016-11-02
  Administered 2016-11-02: 4 mg via INTRAVENOUS
  Filled 2016-11-02: qty 2

## 2016-11-02 MED ORDER — SODIUM CHLORIDE 0.9 % IV SOLN
3.0000 g | Freq: Once | INTRAVENOUS | Status: AC
  Administered 2016-11-02: 3 g via INTRAVENOUS
  Filled 2016-11-02: qty 3

## 2016-11-02 MED ORDER — ONDANSETRON HCL 4 MG PO TABS: 4.0000 mg | ORAL_TABLET | Freq: Four times a day (QID) | ORAL | 0 refills | Status: DC

## 2016-11-02 MED ORDER — IOPAMIDOL (ISOVUE-300) INJECTION 61%
100.0000 mL | Freq: Once | INTRAVENOUS | Status: AC | PRN
  Administered 2016-11-02: 100 mL via INTRAVENOUS

## 2016-11-02 MED ORDER — SODIUM CHLORIDE 0.9 % IV BOLUS (SEPSIS)
500.0000 mL | Freq: Once | INTRAVENOUS | Status: AC
  Administered 2016-11-02: 500 mL via INTRAVENOUS

## 2016-11-02 NOTE — ED Triage Notes (Signed)
Pt states felt fine until around 0000, severe abd pain started at that time with nausea/dry heaves/vomiting, denies diarrhea

## 2016-11-02 NOTE — ED Provider Notes (Signed)
[facility] DEPT Provider Note   CSN: [id number] Arrival date & time: 11/02/16  0346     History   Chief Complaint Chief Complaint  Patient presents with  . Abdominal Pain    HPI Lindsay Bonilla is a 56 y.o. female.  Patient presents to the emergency department for evaluation of abdominal pain. Patient reports that she was awakened around midnight with severe, sharp and stabbing pains in the right lower abdomen. She has had associated nausea and vomiting. There has not been any diarrhea or constipation. She does think she had some frequency and dysuria yesterday, no fever.      Past Medical History:  Diagnosis Date  . Anxiety   . Aortic aneurysm ([place])   . Aortic insufficiency    a. s/p [place] with mechanical AVR 4/17 >> FU Echo 6/17: Mild LVH, EF 60-65%, normal wall motion, ventricular septum with paradoxical septal motion, [employer] mechanical AVR functioning  normally with no regurgitation (mean 6 mmHg), MAC, trivial MR, mild RVE, trivial TR  . Aortic stenosis, severe    Bicuspid valve  . Arthritis   . Asthma   . Bronchitis   . CHF (congestive heart failure) ([place])   . COPD (chronic obstructive pulmonary disease) ([place])   . DDD (degenerative disc disease), cervical   . Depression   . Dysrhythmia    palpitations  . GERD (gastroesophageal reflux disease)   . Hard of hearing   . History of pneumonia   . 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 Migraine headache   . Palpitations   . PONV (postoperative nausea and vomiting)    "only once"  . Shortness of breath    Nodule left lung CT done 8/6  . Stress incontinence   . Urinary frequency    due to Lasix  . Valvular heart disease     Patient Active Problem List   Diagnosis Date Noted  . Chronic diastolic CHF (congestive heart failure) ([place]) 05/05/2016  . Hyperlipidemia 07/10/2015  . Encounter for therapeutic drug monitoring 06/25/2015  . Aortic valve replaced 06/25/2015  . Atrial  flutter ([place]) 06/21/2015  . S/P aortic valve replacement 06/15/2015  . S/P aortic valve replacement and aortoplasty 06/15/2015  . Aortic valve disease   . CAD (coronary artery disease), native coronary artery 08/29/2012  . TOBACCO USER 02/25/2009  . Aneurysm of thoracic aorta ([place]) 02/25/2009  . DYSPNEA ON EXERTION 08/29/2008  . MIGRAINE HEADACHE 08/21/2008  . VALVULAR HEART DISEASE 08/21/2008  . BRONCHITIS 08/21/2008  . ASTHMA 08/21/2008  . COPD (chronic obstructive pulmonary disease) ([place]) 08/21/2008  . ARTHRITIS 08/21/2008  . PALPITATIONS 08/21/2008    Past Surgical History:  Procedure Laterality Date  . ABDOMINAL HYSTERECTOMY    . ANTERIOR CERVICAL DECOMP/DISCECTOMY FUSION  10/05/2011   Procedure: ANTERIOR CERVICAL DECOMPRESSION/DISCECTOMY FUSION 2 LEVELS;  Surgeon: Floyce Stakes, MD;  Location: [facility];  Service: Neurosurgery;  Laterality: N/A;  Cervical five - six , six - seven Anterior cervical decompression/diskectomy/fusion/plate  . ANTERIOR CRUCIATE LIGAMENT REPAIR Right   . AORTIC VALVE REPLACEMENT    . ARTHROSCOPIC REPAIR ACL  rt  . BENTALL PROCEDURE N/A 06/15/2015   Procedure: BENTALL PROCEDURE; HEMI-ARCH REPAIR WITH #23 ST JUDE MECHANICAL AVR CONDUIT AND #28 HEMASHIELD PLATINUM GRAFT;  Surgeon: Gaye Pollack, MD;  Location: [facility] OR;  Service: Open Heart Surgery;  Laterality: N/A;  . CARDIAC CATHETERIZATION N/A 05/21/2015   Procedure: Right/Left Heart Cath and Coronary Angiography;  Surgeon: Annita Brod  [name], MD;  Location: [facility];  Service: Cardiovascular;  Laterality: N/A;  . CHOLECYSTECTOMY    . COLONOSCOPY  10/13/05  . DIAGNOSTIC LAPAROSCOPY    . JOINT REPLACEMENT Right 2016  . KNEE ARTHROSCOPY     rt x4  x1 lft  . MYRINGOPLASTY W/ FAT GRAFT     graft from behind ear  . SIGMOIDOSCOPY  10/13/05, 09/19/05  . TEE WITHOUT CARDIOVERSION N/A 06/15/2015   Procedure: TRANSESOPHAGEAL ECHOCARDIOGRAM (TEE);  Surgeon: Gaye Pollack, MD;  Location: [facility];   Service: Open Heart Surgery;  Laterality: N/A;  . TONSILLECTOMY  2005  . TUBAL LIGATION    . TYMPANOSTOMY TUBE PLACEMENT      OB History    Gravida Para Term Preterm AB Living             3   SAB TAB Ectopic Multiple Live Births                   Home Medications    Prior to Admission medications   Medication Sig Start Date End Date Taking? Authorizing Provider  albuterol (PROVENTIL) (2.5 MG/3ML) 0.083% nebulizer solution Take 2.5 mg by nebulization every 6 (six) hours as needed for wheezing or shortness of breath.    [provider]  albuterol (VENTOLIN HFA) 108 (90 BASE) MCG/ACT inhaler Inhale 1 puff into the lungs 4 (four) times daily as needed. For shortness of breath    [provider]  ALPRAZolam (XANAX) 1 MG tablet Take 1 mg by mouth 4 (four) times daily as needed. For anxiety    [provider]  amoxicillin-clavulanate (AUGMENTIN) 875-125 MG tablet Take 1 tablet by mouth 2 (two) times daily. 11/02/16   Orpah Greek, MD  aspirin EC 81 MG tablet Take 1 tablet (81 mg total) by mouth daily. 07/23/15   Richardson Dopp T, PA-C  budesonide-formoterol (SYMBICORT) 160-4.5 MCG/ACT inhaler Inhale 2 puffs into the lungs 2 (two) times daily.    [provider]  buPROPion (WELLBUTRIN SR) 150 MG 12 hr tablet Take 150 mg by mouth 2 (two) times daily.    [provider]  cetirizine (ZYRTEC) 10 MG tablet Take 10 mg by mouth every morning.     [provider]  diltiazem (CARDIZEM CD) 360 MG 24 hr capsule Take 1 capsule (360 mg total) by mouth daily. 08/09/16   [name], MD  EPINEPHrine (EPIPEN) 0.3 mg/0.3 mL DEVI Inject 0.3 mg into the muscle as needed. For allergic reaction    [provider]  escitalopram (LEXAPRO) 10 MG tablet Take 10 mg by mouth daily.    [provider]  Ferrous Gluconate (IRON 27 PO) Take 1 tablet by mouth daily.    [provider]  furosemide (LASIX) 80 MG tablet Take 80 mg  by mouth 3 (three) times daily.     [provider]  HYDROcodone-acetaminophen (NORCO/VICODIN) 5-325 MG tablet Take 1 tablet by mouth every 4 (four) hours as needed for moderate pain. 11/02/16   Orpah Greek, MD  losartan (COZAAR) 25 MG tablet Take 1 tablet (25 mg total) by mouth daily. 06/10/16 09/08/16  [name], MD  montelukast (SINGULAIR) 10 MG tablet Take 10 mg by mouth at bedtime.     [provider]  Olopatadine HCl (PATADAY) 0.2 % SOLN Place 1 drop into both eyes as needed (FOR ALLERGIES).     [provider]  omeprazole (PRILOSEC) 20 MG capsule Take 20 mg by  mouth 2 (two) times daily before a meal.    [provider]  oxyCODONE (OXYCONTIN) 20 mg 12 hr tablet Take 20 mg by mouth every 12 (twelve) hours.    [provider]  oxyCODONE-acetaminophen (PERCOCET) 10-325 MG tablet Take 1 tablet by mouth every 4 (four) hours as needed for pain.    [provider]  potassium chloride SA (K-DUR,KLOR-CON) 20 MEQ tablet Take 80 mEq by mouth 2 (two) times daily.    [provider]  pravastatin (PRAVACHOL) 40 MG tablet Take 1 tablet (40 mg total) by mouth every evening. 09/26/16   [name], MD  SUMAtriptan (IMITREX) 100 MG tablet Take 100 mg by mouth as needed for migraine (patient states the bottle doesnt have a time based frequency). For migraines    [provider]  Tiotropium Bromide Monohydrate (SPIRIVA RESPIMAT) 1.25 MCG/ACT AERS Inhale 2 sprays into the lungs daily.     [provider]  warfarin (COUMADIN) 5 MG tablet Take 1 tablet (5 mg total) by mouth daily at 6 PM. 10/04/16 10/05/16  [name], MD    Family History Family History  Problem Relation Age of Onset  . Heart attack Father   . Cancer Mother        Type unknown  . Diabetes Unknown        family history  . Asthma Brother   . Hyperlipidemia Unknown        family history    Social History Social History    Substance Use Topics  . Smoking status: Former Smoker    Packs/day: 0.10    Years: 30.00    Types: Cigarettes    Quit date: 04/29/2015  . Smokeless tobacco: Never Used  . Alcohol use No     Allergies   Beta adrenergic blockers; Doxycycline; Peanut-containing drug products; Shrimp [shellfish allergy]; Vancomycin; Avelox [moxifloxacin hcl in nacl]; Ciprofloxacin; Cleocin [clindamycin hcl]; Moxifloxacin; Sulfamethoxazole-trimethoprim; and Tape   Review of Systems Review of Systems  Gastrointestinal: Positive for abdominal pain, nausea and vomiting.  All other systems reviewed and are negative.    Physical Exam Updated Vital Signs BP 131/81 (BP Location: Right Arm)   Pulse 90   Temp 98.3 F (36.8 C) (Oral)   Resp 18   Ht 5' 4"  (1.626 m)   Wt 76.7 kg (169 lb)   SpO2 97%   BMI 29.01 kg/m   Physical Exam  Constitutional: She is oriented to person, place, and time. She appears well-developed and well-nourished. No distress.  HENT:  Head: Normocephalic and atraumatic.  Right Ear: Hearing normal.  Left Ear: Hearing normal.  Nose: Nose normal.  Mouth/Throat: Oropharynx is clear and moist and mucous membranes are normal.  Eyes: Pupils are equal, round, and reactive to light. Conjunctivae and EOM are normal.  Neck: Normal range of motion. Neck supple.  Cardiovascular: Regular rhythm, S1 normal and S2 normal.  Exam reveals no gallop and no friction rub.   No murmur heard. Pulmonary/Chest: Effort normal and breath sounds normal. No respiratory distress. She exhibits no tenderness.  Abdominal: Soft. Normal appearance and bowel sounds are normal. There is no hepatosplenomegaly. There is tenderness in the right lower quadrant. There is no rebound, no guarding, no tenderness at McBurney's point and negative Murphy's sign. No hernia.  Musculoskeletal: Normal range of motion.  Neurological: She is alert and oriented to person, place, and time. She has normal strength. No cranial nerve  deficit or sensory deficit. Coordination normal. GCS eye subscore  is 4. GCS verbal subscore is 5. GCS motor subscore is 6.  Skin: Skin is warm, dry and intact. No rash noted. No cyanosis.  Psychiatric: She has a normal mood and affect. Her speech is normal and behavior is normal. Thought content normal.  Nursing note and vitals reviewed.    ED Treatments / Results  Labs (all labs ordered are listed, but only abnormal results are displayed) Labs Reviewed  CBC WITH DIFFERENTIAL/PLATELET - Abnormal; Notable for the following:       Result Value   WBC 15.1 (*)    Neutro Abs 12.7 (*)    All other components within normal limits  BASIC METABOLIC PANEL - Abnormal; Notable for the following:    Chloride 98 (*)    Glucose, Bld 127 (*)    Calcium 8.6 (*)    All other components within normal limits  I-STAT CG4 LACTIC ACID, ED - Abnormal; Notable for the following:    Lactic Acid, Venous 2.13 (*)    All other components within normal limits  URINALYSIS, ROUTINE W REFLEX MICROSCOPIC    EKG  EKG Interpretation None       Radiology Ct Abdomen Pelvis W Contrast  Result Date: 11/02/2016 CLINICAL DATA:  Severe abdominal pain starting at midnight. Nausea and vomiting. EXAM: CT ABDOMEN AND PELVIS WITH CONTRAST TECHNIQUE: Multidetector CT imaging of the abdomen and pelvis was performed using the standard protocol following bolus administration of intravenous contrast. CONTRAST:  125m ISOVUE-300 IOPAMIDOL (ISOVUE-300) INJECTION 61% COMPARISON:  08/20/2016 FINDINGS: Lower chest: No acute abnormality. Hepatobiliary: No focal liver abnormality is seen. Status post cholecystectomy. No biliary dilatation. Pancreas: Unremarkable. No pancreatic ductal dilatation or surrounding inflammatory changes. Spleen: Normal in size without focal abnormality. Adrenals/Urinary Tract: Adrenal glands are unremarkable. Kidneys are normal, without renal calculi, focal lesion, or hydronephrosis. Bladder is unremarkable.  Stomach/Bowel: Stomach and small bowel are not abnormally distended. Mild of moderate stool throughout the colon without distention. There is wall thickening and edema in the sigmoid region of the colon. This could represent focal colitis, typhlitis, or inflammatory bowel disease. Mild pericolonic infiltration. Appendix is normal. Vascular/Lymphatic: Aortic atherosclerosis. No enlarged abdominal or pelvic lymph nodes. Reproductive: Status post hysterectomy. No adnexal masses. Other: No abdominal wall hernia or abnormality. No abdominopelvic ascites. Musculoskeletal: Degenerative changes in the spine. Schmorl's nodes. Focal area of mixed fat and soft tissue density in the subcutaneous fat lateral to the proximal left femur. This may indicate contusion, hematoma, or fat necrosis. Small hematoma in the right buttocks area is resolving from a larger hematoma seen on the previous study in this area. IMPRESSION: 1. Wall thickening, edema, and infiltration in the cecum suggesting infectious or inflammatory colitis. No obstruction. 2. Resolving hematoma in the right buttocks. 3. Hematoma versus fat necrosis lateral to the left proximal femur. 4. Aortic atherosclerosis. Electronically Signed   By: WLucienne CapersM.D.   On: 11/02/2016 05:44    Procedures Procedures (including critical care time)  Medications Ordered in ED Medications  Ampicillin-Sulbactam (UNASYN) 3 g in sodium chloride 0.9 % 100 mL IVPB (not administered)  sodium chloride 0.9 % bolus 500 mL (500 mLs Intravenous New Bag/Given 11/02/16 0421)  HYDROmorphone (DILAUDID) injection 1 mg (1 mg Intravenous Given 11/02/16 0416)  ondansetron (ZOFRAN) injection 4 mg (4 mg Intravenous Given 11/02/16 0416)  iopamidol (ISOVUE-300) 61 % injection 100 mL (100 mLs Intravenous Contrast Given 11/02/16 0513)     Initial Impression / Assessment and Plan / ED Course  I have reviewed the  triage vital signs and the nursing notes.  Pertinent labs & imaging results that  were available during my care of the patient were reviewed by me and considered in my medical decision making (see chart for details).     Patient presents to the emergency department for evaluation of abdominal pain. Patient complaining of severe right lower abdominal pain that began overnight. She did have tenderness on examination but no clear signs peritonitis. She has an elevated white blood cell count. Patient underwent CT scan to further evaluate. Patient has inflammation of the cecum consistent with infection and colitis.  Patient treated with IV fluids, analgesia with improvement. She will be treated as an outpatient with Augmentin, analgesia, follow up with PCP. Follow-up with GI.  Final Clinical Impressions(s) / ED Diagnoses   Final diagnoses:  Colitis    New Prescriptions New Prescriptions   AMOXICILLIN-CLAVULANATE (AUGMENTIN) 875-125 MG TABLET    Take 1 tablet by mouth 2 (two) times daily.   HYDROCODONE-ACETAMINOPHEN (NORCO/VICODIN) 5-325 MG TABLET    Take 1 tablet by mouth every 4 (four) hours as needed for moderate pain.     Orpah Greek, MD 11/02/16 [phone]

## 2016-11-02 NOTE — ED Notes (Signed)
Patient given discharge instruction, verbalized understand. IV removed, band aid applied. Patient ambulatory out of the department.  

## 2016-11-02 NOTE — ED Notes (Signed)
Pt states she is normally on 2L continuous O2 at home, sat 91, placed on 2L nasal canula

## 2016-11-15 ENCOUNTER — Other Ambulatory Visit (HOSPITAL_COMMUNITY): Payer: Self-pay | Admitting: Pulmonary Disease

## 2016-11-15 DIAGNOSIS — R42 Dizziness and giddiness: Secondary | ICD-10-CM

## 2016-11-21 ENCOUNTER — Ambulatory Visit (HOSPITAL_COMMUNITY)
Admission: RE | Admit: 2016-11-21 | Discharge: 2016-11-21 | Disposition: A | Discharge status: Home / Self Care | Payer: Medicaid Other | Source: Ambulatory Visit | Attending: Pulmonary Disease | Admitting: Pulmonary Disease

## 2016-11-21 ENCOUNTER — Other Ambulatory Visit (HOSPITAL_COMMUNITY): Payer: Self-pay | Admitting: Pulmonary Disease

## 2016-11-21 DIAGNOSIS — G8929 Other chronic pain: Secondary | ICD-10-CM | POA: Diagnosis not present

## 2016-11-21 DIAGNOSIS — I10 Essential (primary) hypertension: Secondary | ICD-10-CM | POA: Insufficient documentation

## 2016-11-21 DIAGNOSIS — M545 Low back pain: Secondary | ICD-10-CM

## 2016-11-21 DIAGNOSIS — Z952 Presence of prosthetic heart valve: Secondary | ICD-10-CM | POA: Insufficient documentation

## 2016-11-21 DIAGNOSIS — M25512 Pain in left shoulder: Secondary | ICD-10-CM

## 2016-11-21 DIAGNOSIS — R42 Dizziness and giddiness: Secondary | ICD-10-CM | POA: Diagnosis not present

## 2016-11-24 ENCOUNTER — Ambulatory Visit (INDEPENDENT_AMBULATORY_CARE_PROVIDER_SITE_OTHER): Payer: Medicaid Other | Admitting: *Deleted

## 2016-11-24 DIAGNOSIS — Z952 Presence of prosthetic heart valve: Secondary | ICD-10-CM

## 2016-11-24 DIAGNOSIS — I4892 Unspecified atrial flutter: Secondary | ICD-10-CM

## 2016-11-24 DIAGNOSIS — I359 Nonrheumatic aortic valve disorder, unspecified: Secondary | ICD-10-CM | POA: Diagnosis not present

## 2016-11-24 DIAGNOSIS — Z5181 Encounter for therapeutic drug level monitoring: Secondary | ICD-10-CM

## 2016-11-24 LAB — POCT INR: INR: 1.3

## 2016-12-01 ENCOUNTER — Ambulatory Visit (INDEPENDENT_AMBULATORY_CARE_PROVIDER_SITE_OTHER): Payer: Medicaid Other | Admitting: *Deleted

## 2016-12-01 DIAGNOSIS — I359 Nonrheumatic aortic valve disorder, unspecified: Secondary | ICD-10-CM

## 2016-12-01 DIAGNOSIS — I4892 Unspecified atrial flutter: Secondary | ICD-10-CM | POA: Diagnosis not present

## 2016-12-01 DIAGNOSIS — Z5181 Encounter for therapeutic drug level monitoring: Secondary | ICD-10-CM

## 2016-12-01 DIAGNOSIS — Z952 Presence of prosthetic heart valve: Secondary | ICD-10-CM

## 2016-12-01 LAB — POCT INR: INR: 2.3

## 2016-12-22 ENCOUNTER — Ambulatory Visit (INDEPENDENT_AMBULATORY_CARE_PROVIDER_SITE_OTHER): Payer: Medicaid Other | Admitting: *Deleted

## 2016-12-22 DIAGNOSIS — I359 Nonrheumatic aortic valve disorder, unspecified: Secondary | ICD-10-CM

## 2016-12-22 DIAGNOSIS — I4892 Unspecified atrial flutter: Secondary | ICD-10-CM | POA: Diagnosis not present

## 2016-12-22 DIAGNOSIS — Z5181 Encounter for therapeutic drug level monitoring: Secondary | ICD-10-CM

## 2016-12-22 DIAGNOSIS — Z952 Presence of prosthetic heart valve: Secondary | ICD-10-CM | POA: Diagnosis not present

## 2016-12-22 LAB — POCT INR: INR: 4

## 2017-01-05 ENCOUNTER — Ambulatory Visit (INDEPENDENT_AMBULATORY_CARE_PROVIDER_SITE_OTHER): Payer: Medicaid Other | Admitting: *Deleted

## 2017-01-05 DIAGNOSIS — Z952 Presence of prosthetic heart valve: Secondary | ICD-10-CM | POA: Diagnosis not present

## 2017-01-05 DIAGNOSIS — I359 Nonrheumatic aortic valve disorder, unspecified: Secondary | ICD-10-CM

## 2017-01-05 DIAGNOSIS — I4892 Unspecified atrial flutter: Secondary | ICD-10-CM

## 2017-01-05 DIAGNOSIS — Z5181 Encounter for therapeutic drug level monitoring: Secondary | ICD-10-CM | POA: Diagnosis not present

## 2017-01-05 LAB — POCT INR: INR: 2.2

## 2017-01-12 NOTE — Progress Notes (Signed)
Chief Complaint  Patient presents with  . Leg Swelling    History of Present Illness: 56 yo female with history of bicuspid aortic valve s/p Ross procedure in 1998 with thoracic aortic aneurysm and now s/p Bentall procedure in April 2017 who is here today for cardiac follow up. She also has O2 dependent COPD, GERD, anxiety, chronic diastolic CHF and tobacco abuse. She has been followed for aortic valve insufficiency and thoracic aortic aneurysm and underwent Bentall procedure per Dr. Cyndia Bent on 06/15/15.  Cardiac cath March 2017 with mild CAD (20% RCA stenosis). Echo June 2017 with normally functioning mechanical aortic valve replacement, normal LV systolic function. Cardiac monitor June 2017 with PACs, PVCs. She has chronic diastolic CHF and is on Lasix.   She is here today for follow up. The patient denies any chest pain, dyspnea, palpitations, orthopnea, PND, dizziness, near syncope or syncope. She reports ongoing issues with fluid retention with daily LE swelling, dyspnea. She cannot take Lasix TID as advised in primary care. She is watching her fluid and salt intake. She wears O2 at home.   Primary Care Physician: [name], MD  Past Medical History:  Diagnosis Date  . Anxiety   . Aortic aneurysm ([place])   . Aortic insufficiency    a. s/p Bentall with mechanical AVR 4/17 >> FU Echo 6/17: Mild LVH, EF 60-65%, normal wall motion, ventricular septum with paradoxical septal motion, St. Jude mechanical AVR functioning  normally with no regurgitation (mean 6 mmHg), MAC, trivial MR, mild RVE, trivial TR  . Aortic stenosis, severe    Bicuspid valve  . Arthritis   . Asthma   . Bronchitis   . CHF (congestive heart failure) ([place])   . COPD (chronic obstructive pulmonary disease) ([place])   . DDD (degenerative disc disease), cervical   . Depression   . Dysrhythmia    palpitations  . GERD (gastroesophageal reflux disease)   . Hard of hearing   . History of pneumonia   . 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 Migraine headache   . Palpitations   . PONV (postoperative nausea and vomiting)    "only once"  . Shortness of breath    Nodule left lung CT done 8/6  . Stress incontinence   . Urinary frequency    due to Lasix  . Valvular heart disease     Past Surgical History:  Procedure Laterality Date  . ABDOMINAL HYSTERECTOMY    . ANTERIOR CERVICAL DECOMPRESSION/DISCECTOMY FUSION 2 LEVELS N/A 10/05/2011   Performed by Floyce Stakes, MD at [facility] NEURO ORS  . ANTERIOR CRUCIATE LIGAMENT REPAIR Right   . AORTIC VALVE REPLACEMENT    . ARTHROSCOPIC REPAIR ACL  rt  . BENTALL PROCEDURE; HEMI-ARCH REPAIR WITH #23 ST JUDE MECHANICAL AVR CONDUIT AND #28 HEMASHIELD PLATINUM GRAFT N/A 06/15/2015   Performed by Gaye Pollack, MD at [facility]    . COLONOSCOPY  10/13/05  . DIAGNOSTIC LAPAROSCOPY    . JOINT REPLACEMENT Right 2016  . KNEE ARTHROSCOPY     rt x4  x1 lft  . MYRINGOPLASTY W/ FAT GRAFT     graft from behind ear  . REDO STERNOTOMY N/A 06/15/2015   Performed by Gaye Pollack, MD at [facility] OR  . Right/Left Heart Cath and Coronary Angiography N/A 05/21/2015   Performed by [name], MD at [facility]  . SIGMOIDOSCOPY  10/13/05, 09/19/05  . TONSILLECTOMY  2005  . TRANSESOPHAGEAL ECHOCARDIOGRAM (TEE) N/A 06/15/2015   Performed by Gaye Pollack, MD at [facility]  . TUBAL LIGATION    . TYMPANOSTOMY TUBE PLACEMENT      Current Outpatient Medications  Medication Sig Dispense Refill  . albuterol (PROVENTIL) (2.5 MG/3ML) 0.083% nebulizer solution Take 2.5 mg by nebulization every 6 (six) hours as needed for wheezing or shortness of breath.    Marland Kitchen albuterol (VENTOLIN HFA) 108 (90 BASE) MCG/ACT inhaler Inhale 1 puff into the lungs 4 (four) times daily as needed. For shortness of breath    . ALPRAZolam (XANAX) 1 MG tablet Take 1 mg by mouth 4 (four) times daily as needed. For anxiety    . amoxicillin-clavulanate (AUGMENTIN)  875-125 MG tablet Take 1 tablet by mouth 2 (two) times daily. 20 tablet 0  . aspirin EC 81 MG tablet Take 1 tablet (81 mg total) by mouth daily.    . budesonide-formoterol (SYMBICORT) 160-4.5 MCG/ACT inhaler Inhale 2 puffs into the lungs 2 (two) times daily.    Marland Kitchen buPROPion (WELLBUTRIN SR) 150 MG 12 hr tablet Take 150 mg by mouth 2 (two) times daily.    . cetirizine (ZYRTEC) 10 MG tablet Take 10 mg by mouth every morning.     . diltiazem (CARDIZEM CD) 360 MG 24 hr capsule Take 1 capsule (360 mg total) by mouth daily. 90 capsule 2  . EPINEPHrine (EPIPEN) 0.3 mg/0.3 mL DEVI Inject 0.3 mg into the muscle as needed. For allergic reaction    . escitalopram (LEXAPRO) 10 MG tablet Take 10 mg by mouth daily.    . Ferrous Gluconate (IRON 27 PO) Take 1 tablet by mouth daily.    . montelukast (SINGULAIR) 10 MG tablet Take 10 mg by mouth at bedtime.     . Olopatadine HCl (PATADAY) 0.2 % SOLN Place 1 drop into both eyes as needed (FOR ALLERGIES).     Marland Kitchen omeprazole (PRILOSEC) 20 MG capsule Take 20 mg by mouth 2 (two) times daily before a meal.    . ondansetron (ZOFRAN) 4 MG tablet Take 1 tablet (4 mg total) by mouth every 6 (six) hours. 12 tablet 0  . oxyCODONE (OXYCONTIN) 20 mg 12 hr tablet Take 20 mg by mouth every 12 (twelve) hours.    Marland Kitchen oxyCODONE-acetaminophen (PERCOCET) 10-325 MG tablet Take 1 tablet by mouth every 4 (four) hours as needed for pain.    . potassium chloride SA (K-DUR,KLOR-CON) 20 MEQ tablet Take 80 mEq by mouth 2 (two) times daily.    . pravastatin (PRAVACHOL) 40 MG tablet Take 1 tablet (40 mg total) by mouth every evening. 90 tablet 2  . SUMAtriptan (IMITREX) 100 MG tablet Take 100 mg by mouth as needed for migraine (patient states the bottle doesnt have a time based frequency). For migraines    . Tiotropium Bromide Monohydrate (SPIRIVA RESPIMAT) 1.25 MCG/ACT AERS Inhale 2 sprays into the lungs daily.     Marland Kitchen warfarin (COUMADIN) 5 MG tablet Take 1 tablet (5 mg total) by mouth daily at 6 PM. 35  tablet 3  . furosemide (LASIX) 80 MG tablet Take 1 tablet (80 mg total) 2 (two) times daily by mouth. 60 tablet 6  . losartan (COZAAR) 25 MG tablet Take 1 tablet (25 mg total) by mouth daily. 90 tablet 3  . metolazone (ZAROXOLYN) 2.5 MG tablet Take one tablet by mouth on Monday, Wednesday and Friday 12 tablet 3   No current facility-administered medications for this visit.     Allergies  Allergen Reactions  . Beta Adrenergic Blockers Anaphylaxis  . Doxycycline Other (See Comments)    "Swelling, dizziness, sleepy, talking out of my head, almost caused liver failure"  . Peanut-Containing Drug Products Anaphylaxis, Shortness Of Breath, Swelling and Other (See Comments)    Swelling of the throat  . Shrimp [Shellfish Allergy] Anaphylaxis  . Vancomycin Swelling    Rash and severe swelling  . Avelox [Moxifloxacin Hcl In Nacl] Rash  . Ciprofloxacin Nausea And Vomiting  . Cleocin [Clindamycin Hcl] Rash  . Moxifloxacin Rash  . Sulfamethoxazole-Trimethoprim Rash  . Tape Other (See Comments)    White clear itches, rash    Social History   Socioeconomic History  . Marital status: Legally Separated    Spouse name: Not on file  . Number of children: Not on file  . Years of education: Not on file  . Highest education level: Not on file  Social Needs  . Financial resource strain: Not on file  . Food insecurity - worry: Not on file  . Food insecurity - inability: Not on file  . Transportation needs - medical: Not on file  . Transportation needs - non-medical: Not on file  Occupational History  . Not on file  Tobacco Use  . Smoking status: Former Smoker    Packs/day: 0.10    Years: 30.00    Pack years: 3.00    Types: Cigarettes    Last attempt to quit: 04/29/2015    Years since quitting: 1.7  . Smokeless tobacco: Never Used  Substance and Sexual Activity  . Alcohol use: No    Alcohol/week: 0.0 oz  . Drug use: No  . Sexual activity: No  Other Topics Concern  . Not on file  Social  History Narrative  . Not on file    Family History  Problem Relation Age of Onset  . Heart attack Father   . Cancer Mother        Type unknown  . Diabetes Unknown        family history  . Asthma Brother   . Hyperlipidemia Unknown        family history    Review of Systems:  As stated in the HPI and otherwise negative.   BP 116/80   Pulse 81   Ht _0  (1.626 m)   Wt 168 lb (76.2 kg)   SpO2 94%   BMI 28.84 kg/m   Physical Examination:  General: Well developed, well nourished, NAD  HEENT: OP clear, mucus membranes moist  SKIN: warm, dry. No rashes. Neuro: No focal deficits  Musculoskeletal: Muscle strength 5/5 all ext  Psychiatric: Mood and affect normal  Neck: No JVD, no carotid bruits, no thyromegaly, no lymphadenopathy.  Lungs:Clear bilaterally, no wheezes, rhonci, crackles Cardiovascular: Regular rate and rhythm. No murmurs, gallops or rubs. Abdomen:Soft. Bowel sounds present. Non-tender.  Extremities: No lower extremity edema noted today. Pulses are trace in the bilateral DP/PT.  Echo 07/30/15: Left ventricle: The cavity size was normal. Wall thickness was   increased in a pattern of mild LVH. Systolic function was normal.   The estimated ejection fraction was in the range of 60% to 65%.   Wall motion was normal; there were no regional wall motion   abnormalities. - Ventricular septum: Somewhat paradoxical septal motion with   intermittent respiratory flattening. - Aortic valve: Status post placement of 28 mm Hemashield graft. A   St. Jude [employer] mechanical prosthesis was present. There was no   significant regurgitation. Mean gradient (  S): 6 mm Hg. Peak   gradient (S): 13 mm Hg. - Mitral valve: Calcified annulus. Mildly thickened leaflets .   There was trivial regurgitation. - Right ventricle: Systolic function was mildly reduced. - Tricuspid valve: There was trivial regurgitation. - Pulmonic valve: Poorly visualized. Transvalvular gradient normal   range.  Patient reported to be status post Ross procedure. - Pulmonary arteries: Systolic pressure could not be accurately   estimated. - Pericardium, extracardiac: There was no pericardial effusion.  Impressions:  - Mild LVH with LVEF 60-65%. Grossly normal diastolic function.   Somewhat paradoxical septal motion with intermittent respiratory   flattening. MAC with mildly thickened mitral leaflets and trivial   mitral regurgitation. St. Jude aortic prosthesis with grossly   normal function and no significant aortic regurgitation. Mildly   reduced RV contraction. Pulmonic valve poorly visualized, but   transvalvular gradient is normal range. Unable to assess PASP.  EKG:  EKG is ordered today. The ekg ordered today demonstrates NSR, rate 81 bpm. Possible LAE. Non-specific ST and T wave abn.   Recent Labs: 11/02/2016: BUN 14; Creatinine, Ser 0.84; Hemoglobin 14.0; Platelets 242; Potassium 3.9; Sodium 138   Lipid Panel    Component Value Date/Time   CHOL 178 10/22/2015 0933   TRIG 97 10/22/2015 0933   HDL 67 10/22/2015 0933   CHOLHDL 2.7 10/22/2015 0933   VLDL 19 10/22/2015 0933   LDLCALC 92 10/22/2015 0933   LDLDIRECT 79.0 03/25/2014 0947     Wt Readings from Last 3 Encounters:  01/13/17 168 lb (76.2 kg)  11/02/16 169 lb (76.7 kg)  08/20/16 160 lb (72.6 kg)     Other studies Reviewed: Additional studies/ records that were reviewed today include: . Review of the above records demonstrates:    Assessment and Plan:   1. Aortic valve disease: She is s/p mechanical AVR in 2017. Will continue coumadin and SBE prophylaxis.     2. Thoracic aortic aneurysm: She is s/p Bentall procedure 2017. Will follow.   3. Tobacco abuse, in remission: She quit smoking.   4. Palpitations: PACs, PVCs noted on monitor June 2017. She is in sinus today. Rare palpitations. Continue Diltiazem.   5. HLD: Continue statin. LDL near goal.   6. Acute on Chronic diastolic CHF: Weight is overall stable  from Sept 2018 but up 8 lbs from June 2018. She reports swelling in her legs every day. She feels that she is retaining fluid. She is unwilling to take Lasix 80 TID. She is ok taking it 80 mg BID. I will have her take Lasix 80 mg po BID and add metolazone 2.5 mg three days per week. BMET in one week.    7. CAD: Mild RCA plaque by cath 2017. Continue ASA and statin.   8. HTN: BP is controlled. No changes.   8. Leg pain: She has bilateral LE pain. Pulses are not brisk but palpable in her bilateral DP/PT. Will arrange ABI to assess.    Current medicines are reviewed at length with the patient today.  The patient does not have concerns regarding medicines.  The following changes have been made:  no change  Labs/ tests ordered today include:   Orders Placed This Encounter  Procedures  . Basic Metabolic Panel (BMET)  . EKG 12-Lead    Disposition:   FU with me in 12 months  Signed, Lauree Chandler, MD 01/13/2017 10:41 AM    [facility] [address], [place], [place]  [address] Phone: [phone]  [phone]; Fax: [phone]

## 2017-01-13 ENCOUNTER — Encounter: Payer: Self-pay | Admitting: Cardiovascular Disease

## 2017-01-13 ENCOUNTER — Ambulatory Visit (INDEPENDENT_AMBULATORY_CARE_PROVIDER_SITE_OTHER): Payer: Medicaid Other | Admitting: Cardiovascular Disease

## 2017-01-13 VITALS — BP 116/80 | HR 81 | Ht 64.0 in | Wt 168.0 lb

## 2017-01-13 DIAGNOSIS — I10 Essential (primary) hypertension: Secondary | ICD-10-CM | POA: Diagnosis not present

## 2017-01-13 DIAGNOSIS — I712 Thoracic aortic aneurysm, without rupture, unspecified: Secondary | ICD-10-CM

## 2017-01-13 DIAGNOSIS — I359 Nonrheumatic aortic valve disorder, unspecified: Secondary | ICD-10-CM | POA: Diagnosis not present

## 2017-01-13 DIAGNOSIS — M79604 Pain in right leg: Secondary | ICD-10-CM

## 2017-01-13 DIAGNOSIS — I5033 Acute on chronic diastolic (congestive) heart failure: Secondary | ICD-10-CM

## 2017-01-13 DIAGNOSIS — M79605 Pain in left leg: Secondary | ICD-10-CM | POA: Diagnosis not present

## 2017-01-13 DIAGNOSIS — F17201 Nicotine dependence, unspecified, in remission: Secondary | ICD-10-CM | POA: Diagnosis not present

## 2017-01-13 DIAGNOSIS — I2510 Atherosclerotic heart disease of native coronary artery without angina pectoris: Secondary | ICD-10-CM | POA: Diagnosis not present

## 2017-01-13 MED ORDER — METOLAZONE 2.5 MG PO TABS: ORAL_TABLET | ORAL | 3 refills | Status: DC

## 2017-01-13 MED ORDER — FUROSEMIDE 80 MG PO TABS: 80.0000 mg | ORAL_TABLET | Freq: Two times a day (BID) | ORAL | 6 refills | Status: DC

## 2017-01-13 NOTE — Patient Instructions (Signed)
Medication Instructions:  Your physician has recommended you make the following change in your medication:  Change furosemide to 80 mg by mouth twice daily. Start metolazone 2.5 mg by mouth on Monday, Wednesday and Friday.  Take this about 30 minutes before AM furosemide dose   Labwork: Your physician recommends that you return for lab work on 01/23/18.  (BMP)   Testing/Procedures: Your physician has requested that you have a lower  extremity arterial duplex. This test is an ultrasound of the arteries in the legs.. It looks at arterial blood flow in the legs  . Allow one hour for Lower  Arterial scans. There are no restrictions or special instructions  Follow-Up: Your physician recommends that you schedule a follow-up appointment in: 6-8 weeks with PA.     Any Other Special Instructions Will Be Listed Below (If Applicable).     If you need a refill on your cardiac medications before your next appointment, please call your pharmacy.

## 2017-01-16 ENCOUNTER — Other Ambulatory Visit: Payer: Self-pay | Admitting: Cardiovascular Disease

## 2017-01-16 DIAGNOSIS — M79604 Pain in right leg: Secondary | ICD-10-CM

## 2017-01-16 DIAGNOSIS — M79605 Pain in left leg: Principal | ICD-10-CM

## 2017-01-23 ENCOUNTER — Other Ambulatory Visit: Payer: Medicaid Other

## 2017-01-26 ENCOUNTER — Ambulatory Visit (INDEPENDENT_AMBULATORY_CARE_PROVIDER_SITE_OTHER): Payer: Medicaid Other | Admitting: *Deleted

## 2017-01-26 DIAGNOSIS — Z952 Presence of prosthetic heart valve: Secondary | ICD-10-CM | POA: Diagnosis not present

## 2017-01-26 DIAGNOSIS — I4892 Unspecified atrial flutter: Secondary | ICD-10-CM

## 2017-01-26 DIAGNOSIS — I359 Nonrheumatic aortic valve disorder, unspecified: Secondary | ICD-10-CM | POA: Diagnosis not present

## 2017-01-26 DIAGNOSIS — Z5181 Encounter for therapeutic drug level monitoring: Secondary | ICD-10-CM

## 2017-01-26 LAB — POCT INR: INR: 1.3

## 2017-01-29 ENCOUNTER — Encounter: Payer: Self-pay | Admitting: Cardiovascular Disease

## 2017-02-01 ENCOUNTER — Ambulatory Visit (HOSPITAL_COMMUNITY)
Admission: RE | Admit: 2017-02-01 | Discharge: 2017-02-01 | Disposition: A | Discharge status: Home / Self Care | Payer: Medicaid Other | Source: Ambulatory Visit | Attending: Cardiovascular Disease | Admitting: Cardiovascular Disease

## 2017-02-01 ENCOUNTER — Other Ambulatory Visit: Payer: Self-pay | Admitting: *Deleted

## 2017-02-01 ENCOUNTER — Other Ambulatory Visit: Payer: Medicaid Other

## 2017-02-01 DIAGNOSIS — M79605 Pain in left leg: Secondary | ICD-10-CM | POA: Insufficient documentation

## 2017-02-01 DIAGNOSIS — M79604 Pain in right leg: Secondary | ICD-10-CM | POA: Diagnosis not present

## 2017-02-01 DIAGNOSIS — I5033 Acute on chronic diastolic (congestive) heart failure: Secondary | ICD-10-CM

## 2017-02-01 DIAGNOSIS — I2510 Atherosclerotic heart disease of native coronary artery without angina pectoris: Secondary | ICD-10-CM

## 2017-02-02 LAB — BASIC METABOLIC PANEL
BUN/Creatinine Ratio: 10 (ref 9–23)
BUN: 9 mg/dL (ref 6–24)
CALCIUM: 9.3 mg/dL (ref 8.7–10.2)
CO2: 32 mmol/L — AB (ref 20–29)
CREATININE: 0.94 mg/dL (ref 0.57–1.00)
Chloride: 94 mmol/L — ABNORMAL LOW (ref 96–106)
GFR calc Af Amer: 79 mL/min/{1.73_m2} (ref >59)
GFR, EST NON AFRICAN AMERICAN: 69 mL/min/{1.73_m2} (ref >59)
GLUCOSE: 99 mg/dL (ref 65–99)
Potassium: 4 mmol/L (ref 3.5–5.2)
SODIUM: 142 mmol/L (ref 134–144)

## 2017-02-14 ENCOUNTER — Ambulatory Visit (INDEPENDENT_AMBULATORY_CARE_PROVIDER_SITE_OTHER): Payer: Medicaid Other | Admitting: *Deleted

## 2017-02-14 DIAGNOSIS — Z952 Presence of prosthetic heart valve: Secondary | ICD-10-CM

## 2017-02-14 DIAGNOSIS — I4892 Unspecified atrial flutter: Secondary | ICD-10-CM

## 2017-02-14 DIAGNOSIS — Z5181 Encounter for therapeutic drug level monitoring: Secondary | ICD-10-CM | POA: Diagnosis not present

## 2017-02-14 DIAGNOSIS — I359 Nonrheumatic aortic valve disorder, unspecified: Secondary | ICD-10-CM | POA: Diagnosis not present

## 2017-02-14 LAB — POCT INR: INR: 2.4

## 2017-02-27 ENCOUNTER — Other Ambulatory Visit: Payer: Self-pay | Admitting: Cardiovascular Disease

## 2017-03-01 ENCOUNTER — Encounter: Payer: Self-pay | Admitting: Cardiology

## 2017-03-01 ENCOUNTER — Ambulatory Visit: Payer: Medicaid Other | Admitting: Cardiology

## 2017-03-01 VITALS — BP 87/61 | HR 86 | Ht 64.0 in | Wt 162.0 lb

## 2017-03-01 DIAGNOSIS — I5032 Chronic diastolic (congestive) heart failure: Secondary | ICD-10-CM

## 2017-03-01 NOTE — Patient Instructions (Signed)
Medication Instructions:  Your physician recommends that you continue on your current medications as directed. Please refer to the Current Medication list given to you today.   Labwork: None Ordered   Testing/Procedures: None Ordered   Follow-Up: Your physician wants you to follow-up in: November 2019 with Dr. [name]. You will receive a reminder letter in the mail two months in advance. If you don't receive a letter, please call our office to schedule the follow-up appointment.   If you need a refill on your cardiac medications before your next appointment, please call your pharmacy.   Thank you for choosing [facility]! Christen Bame, RN [phone]

## 2017-03-01 NOTE — Progress Notes (Signed)
03/01/2017 Lindsay Bonilla   10/23/1960  [id number]  Primary Physician [name], MD Primary Cardiologist: Dr. [name]  Reason for Visit/CC: F/u for Acute on chronic diastolic HF.   HPI:  Lindsay Bonilla is a 57 y.o. female who is being seen today for f/u for acute on chronic diastolic HF. She is followed by Dr. [name] with a h/o aortic valve disease and ascending thoracic anurysym s/p Bentall Procedure with mechanical AV in 2017. She is on chronic anticoagulation with coumadin. Also with a h/o chronic diastolic HF and COPD. She was recently seen by Dr. [name] 12/2016 and noted fluid retention, 8 lb weight gain from baseline and mild dyspnea. Her diuretic regimen was adjusted. Pt was continued on Lasix 80 mg BID and metolazone 2.5 mg 3x/day was added. She had a repeat BMP in our office 7 days later that showed normal renal function and K.   She presents back to clinic for f/u. She is doing better. Weigh is down 6 lb from 168 to 162 lb. LEE resolved. No resting dyspnea.   Current Meds  Medication Sig  . albuterol (PROVENTIL) (2.5 MG/3ML) 0.083% nebulizer solution Take 2.5 mg by nebulization every 6 (six) hours as needed for wheezing or shortness of breath.  Marland Kitchen albuterol (VENTOLIN HFA) 108 (90 BASE) MCG/ACT inhaler Inhale 1 puff into the lungs 4 (four) times daily as needed. For shortness of breath  . ALPRAZolam (XANAX) 1 MG tablet Take 1 mg by mouth 4 (four) times daily as needed. For anxiety  . amoxicillin-clavulanate (AUGMENTIN) 875-125 MG tablet Take 1 tablet by mouth 2 (two) times daily.  Marland Kitchen aspirin EC 81 MG tablet Take 1 tablet (81 mg total) by mouth daily.  . budesonide-formoterol (SYMBICORT) 160-4.5 MCG/ACT inhaler Inhale 2 puffs into the lungs 2 (two) times daily.  . cetirizine (ZYRTEC) 10 MG tablet Take 10 mg by mouth every morning.   . diltiazem (CARDIZEM CD) 360 MG 24 hr capsule Take 1 capsule (360 mg total) by mouth daily.  Marland Kitchen EPINEPHrine (EPIPEN) 0.3 mg/0.3 mL DEVI Inject 0.3  mg into the muscle as needed. For allergic reaction  . escitalopram (LEXAPRO) 10 MG tablet Take 10 mg by mouth daily.  . Ferrous Gluconate (IRON 27 PO) Take 1 tablet by mouth daily.  . furosemide (LASIX) 80 MG tablet Take 1 tablet (80 mg total) 2 (two) times daily by mouth.  . metolazone (ZAROXOLYN) 2.5 MG tablet Take one tablet by mouth on Monday, Wednesday and Friday  . montelukast (SINGULAIR) 10 MG tablet Take 10 mg by mouth at bedtime.   . Olopatadine HCl (PATADAY) 0.2 % SOLN Place 1 drop into both eyes as needed (FOR ALLERGIES).   Marland Kitchen omeprazole (PRILOSEC) 20 MG capsule Take 20 mg by mouth 3 (three) times daily.   . ondansetron (ZOFRAN) 4 MG tablet Take 1 tablet (4 mg total) by mouth every 6 (six) hours.  Marland Kitchen oxyCODONE (OXYCONTIN) 20 mg 12 hr tablet Take 20 mg by mouth every 12 (twelve) hours.  Marland Kitchen oxyCODONE-acetaminophen (PERCOCET) 10-325 MG tablet Take 1 tablet by mouth every 4 (four) hours as needed for pain.  . potassium chloride SA (K-DUR,KLOR-CON) 20 MEQ tablet Take 80 mEq by mouth 2 (two) times daily.  . pravastatin (PRAVACHOL) 40 MG tablet Take 1 tablet (40 mg total) by mouth every evening.  . SUMAtriptan (IMITREX) 100 MG tablet Take 100 mg by mouth as needed for migraine (patient states the bottle doesnt have a time based frequency). For migraines  . Tiotropium Bromide  Monohydrate (SPIRIVA RESPIMAT) 1.25 MCG/ACT AERS Inhale 2 sprays into the lungs daily.   Marland Kitchen warfarin (COUMADIN) 5 MG tablet Take 1 tablet daily except 1/2 tablet on Tuesdays   Allergies  Allergen Reactions  . Beta Adrenergic Blockers Anaphylaxis  . Doxycycline Other (See Comments)    "Swelling, dizziness, sleepy, talking out of my head, almost caused liver failure"  . Peanut-Containing Drug Products Anaphylaxis, Shortness Of Breath, Swelling and Other (See Comments)    Swelling of the throat  . Shrimp [Shellfish Allergy] Anaphylaxis  . Vancomycin Swelling    Rash and severe swelling  . Avelox [Moxifloxacin Hcl In  Nacl] Rash  . Ciprofloxacin Nausea And Vomiting  . Cleocin [Clindamycin Hcl] Rash  . Moxifloxacin Rash  . Sulfamethoxazole-Trimethoprim Rash  . Tape Other (See Comments)    White clear itches, rash   Past Medical History:  Diagnosis Date  . Anxiety   . Aortic aneurysm ([place])   . Aortic insufficiency    a. s/p Bentall with mechanical AVR 4/17 >> FU Echo 6/17: Mild LVH, EF 60-65%, normal wall motion, ventricular septum with paradoxical septal motion, St. Jude mechanical AVR functioning  normally with no regurgitation (mean 6 mmHg), MAC, trivial MR, mild RVE, trivial TR  . Aortic stenosis, severe    Bicuspid valve  . Arthritis   . Asthma   . Bronchitis   . CHF (congestive heart failure) ([place])   . COPD (chronic obstructive pulmonary disease) ([place])   . DDD (degenerative disc disease), cervical   . Depression   . Dysrhythmia    palpitations  . GERD (gastroesophageal reflux disease)   . Hard of hearing   . History of pneumonia   . 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 Migraine headache   . Palpitations   . PONV (postoperative nausea and vomiting)    "only once"  . Shortness of breath    Nodule left lung CT done 8/6  . Stress incontinence   . Urinary frequency    due to Lasix  . Valvular heart disease    Family History  Problem Relation Age of Onset  . Heart attack Father   . Cancer Mother        Type unknown  . Diabetes Unknown        family history  . Asthma Brother   . Hyperlipidemia Unknown        family history   Past Surgical History:  Procedure Laterality Date  . ABDOMINAL HYSTERECTOMY    . ANTERIOR CERVICAL DECOMP/DISCECTOMY FUSION  10/05/2011   Procedure: ANTERIOR CERVICAL DECOMPRESSION/DISCECTOMY FUSION 2 LEVELS;  Surgeon: Floyce Stakes, MD;  Location: [facility] NEURO ORS;  Service: Neurosurgery;  Laterality: N/A;  Cervical five - six , six - seven Anterior cervical decompression/diskectomy/fusion/plate  . ANTERIOR CRUCIATE LIGAMENT  REPAIR Right   . AORTIC VALVE REPLACEMENT    . ARTHROSCOPIC REPAIR ACL  rt  . BENTALL PROCEDURE N/A 06/15/2015   Procedure: BENTALL PROCEDURE; HEMI-ARCH REPAIR WITH #23 ST JUDE MECHANICAL AVR CONDUIT AND #28 HEMASHIELD PLATINUM GRAFT;  Surgeon: Gaye Pollack, MD;  Location: [facility];  Service: Open Heart Surgery;  Laterality: N/A;  . CARDIAC CATHETERIZATION N/A 05/21/2015   Procedure: Right/Left Heart Cath and Coronary Angiography;  Surgeon: [name], MD;  Location: [facility];  Service: Cardiovascular;  Laterality: N/A;  . CHOLECYSTECTOMY    . COLONOSCOPY  10/13/05  . DIAGNOSTIC LAPAROSCOPY    . JOINT REPLACEMENT Right 2016  .  KNEE ARTHROSCOPY     rt x4  x1 lft  . MYRINGOPLASTY W/ FAT GRAFT     graft from behind ear  . SIGMOIDOSCOPY  10/13/05, 09/19/05  . TEE WITHOUT CARDIOVERSION N/A 06/15/2015   Procedure: TRANSESOPHAGEAL ECHOCARDIOGRAM (TEE);  Surgeon: Gaye Pollack, MD;  Location: [facility];  Service: Open Heart Surgery;  Laterality: N/A;  . TONSILLECTOMY  2005  . TUBAL LIGATION    . TYMPANOSTOMY TUBE PLACEMENT     Social History   Socioeconomic History  . Marital status: Legally Separated    Spouse name: Not on file  . Number of children: Not on file  . Years of education: Not on file  . Highest education level: Not on file  Social Needs  . Financial resource strain: Not on file  . Food insecurity - worry: Not on file  . Food insecurity - inability: Not on file  . Transportation needs - medical: Not on file  . Transportation needs - non-medical: Not on file  Occupational History  . Not on file  Tobacco Use  . Smoking status: Former Smoker    Packs/day: 0.10    Years: 30.00    Pack years: 3.00    Types: Cigarettes    Last attempt to quit: 04/29/2015    Years since quitting: 1.8  . Smokeless tobacco: Never Used  Substance and Sexual Activity  . Alcohol use: No    Alcohol/week: 0.0 oz  . Drug use: No  . Sexual activity: No  Other Topics Concern  . Not  on file  Social History Narrative  . Not on file     Review of Systems: General: negative for chills, fever, night sweats or weight changes.  Cardiovascular: negative for chest pain, dyspnea on exertion, edema, orthopnea, palpitations, paroxysmal nocturnal dyspnea or shortness of breath Dermatological: negative for rash Respiratory: negative for cough or wheezing Urologic: negative for hematuria Abdominal: negative for nausea, vomiting, diarrhea, bright red blood per rectum, melena, or hematemesis Neurologic: negative for visual changes, syncope, or dizziness All other systems reviewed and are otherwise negative except as noted above.   Physical Exam:  Pulse 86, height _0  (1.626 m), weight 162 lb (73.5 kg), SpO2 96 %.  General appearance: alert, cooperative and no distress Neck: no carotid bruit and no JVD Lungs: clear to auscultation bilaterally Heart: regular rate and rhythm, S1, S2 normal, no murmur, click, rub or gallop Extremities: extremities normal, atraumatic, no cyanosis or edema Pulses: 2+ and symmetric Skin: Skin color, texture, turgor normal. No rashes or lesions Neurologic: Grossly normal  EKG not performed -- personally reviewed   ASSESSMENT AND PLAN:   1. Chronic Diastolic HF: volume status improved after addition of metolazone 3x/week with Lasix. No further edema. Weight has returned to baseline. F/u BMP showed normal renal function and K. Continue current regimen.   2. Aortic Valve Disease: s/p mechanical AVR in 2017. Stable w/o CP or dyspnea. Will continue coumadin and SBE prophylaxis.     3. Thoracic Aortic Aneurysm: s/p Bentall procedure 2017.   Follow-Up w/ Dr. [name] in 1 year.   Garrette Caine Ladoris Gene, MHS [facility] 03/01/2017 2:50 PM

## 2017-03-09 ENCOUNTER — Encounter: Payer: Self-pay | Admitting: *Deleted

## 2017-03-09 ENCOUNTER — Ambulatory Visit (INDEPENDENT_AMBULATORY_CARE_PROVIDER_SITE_OTHER): Payer: Medicaid Other | Admitting: *Deleted

## 2017-03-09 DIAGNOSIS — I359 Nonrheumatic aortic valve disorder, unspecified: Secondary | ICD-10-CM

## 2017-03-09 DIAGNOSIS — Z952 Presence of prosthetic heart valve: Secondary | ICD-10-CM

## 2017-03-09 DIAGNOSIS — Z5181 Encounter for therapeutic drug level monitoring: Secondary | ICD-10-CM

## 2017-03-09 DIAGNOSIS — I4892 Unspecified atrial flutter: Secondary | ICD-10-CM | POA: Diagnosis not present

## 2017-03-09 LAB — POCT INR: INR: 9.3

## 2017-03-09 NOTE — Patient Instructions (Signed)
POC > 8.0   Sent to lab at [facility].  INR 9.3 Pt to hold coumadin until INR check on 03/14/17.  Pt denies any s/s of bleeding.  Bleeding and fall precautions discussed with pt.  She is to go to the ED if she develops any bleeding or falls

## 2017-03-14 ENCOUNTER — Ambulatory Visit (INDEPENDENT_AMBULATORY_CARE_PROVIDER_SITE_OTHER): Payer: Medicaid Other | Admitting: *Deleted

## 2017-03-14 DIAGNOSIS — Z5181 Encounter for therapeutic drug level monitoring: Secondary | ICD-10-CM

## 2017-03-14 DIAGNOSIS — Z952 Presence of prosthetic heart valve: Secondary | ICD-10-CM

## 2017-03-14 DIAGNOSIS — I359 Nonrheumatic aortic valve disorder, unspecified: Secondary | ICD-10-CM | POA: Diagnosis not present

## 2017-03-14 DIAGNOSIS — I4892 Unspecified atrial flutter: Secondary | ICD-10-CM | POA: Diagnosis not present

## 2017-03-14 LAB — POCT INR: INR: 1.1

## 2017-03-14 NOTE — Patient Instructions (Signed)
Take 7.5mg  tonight then resume 5mg  daily except 2.5mg  on Tuesdays Recheck in 1 week

## 2017-03-20 ENCOUNTER — Encounter: Payer: Self-pay | Admitting: Cardiovascular Disease

## 2017-03-23 ENCOUNTER — Ambulatory Visit (INDEPENDENT_AMBULATORY_CARE_PROVIDER_SITE_OTHER): Payer: Medicaid Other | Admitting: *Deleted

## 2017-03-23 DIAGNOSIS — I359 Nonrheumatic aortic valve disorder, unspecified: Secondary | ICD-10-CM

## 2017-03-23 DIAGNOSIS — Z952 Presence of prosthetic heart valve: Secondary | ICD-10-CM

## 2017-03-23 DIAGNOSIS — I4892 Unspecified atrial flutter: Secondary | ICD-10-CM | POA: Diagnosis not present

## 2017-03-23 DIAGNOSIS — Z5181 Encounter for therapeutic drug level monitoring: Secondary | ICD-10-CM

## 2017-03-23 LAB — POCT INR: INR: 2.7

## 2017-03-23 NOTE — Patient Instructions (Signed)
Continue coumadin 5mg  daily except 2.5mg  on Tuesdays Recheck in 2 weeks

## 2017-03-31 ENCOUNTER — Other Ambulatory Visit (HOSPITAL_BASED_OUTPATIENT_CLINIC_OR_DEPARTMENT_OTHER): Payer: Self-pay

## 2017-03-31 DIAGNOSIS — R5383 Other fatigue: Secondary | ICD-10-CM

## 2017-03-31 DIAGNOSIS — G4730 Sleep apnea, unspecified: Secondary | ICD-10-CM

## 2017-03-31 DIAGNOSIS — G4700 Insomnia, unspecified: Secondary | ICD-10-CM

## 2017-03-31 DIAGNOSIS — R0683 Snoring: Secondary | ICD-10-CM

## 2017-04-06 ENCOUNTER — Ambulatory Visit (INDEPENDENT_AMBULATORY_CARE_PROVIDER_SITE_OTHER): Payer: Medicaid Other | Admitting: *Deleted

## 2017-04-06 DIAGNOSIS — I4892 Unspecified atrial flutter: Secondary | ICD-10-CM | POA: Diagnosis not present

## 2017-04-06 DIAGNOSIS — I359 Nonrheumatic aortic valve disorder, unspecified: Secondary | ICD-10-CM | POA: Diagnosis not present

## 2017-04-06 DIAGNOSIS — Z5181 Encounter for therapeutic drug level monitoring: Secondary | ICD-10-CM

## 2017-04-06 DIAGNOSIS — Z952 Presence of prosthetic heart valve: Secondary | ICD-10-CM

## 2017-04-06 LAB — POCT INR: INR: 4.3

## 2017-04-06 NOTE — Patient Instructions (Signed)
Hold coumadin tonight then decrease dose to 5mg  daily except 2.5mg  on Tuesdays and Fridays Recheck in 2.5 weeks

## 2017-04-23 ENCOUNTER — Ambulatory Visit: Payer: Medicaid Other | Attending: Pulmonary Disease | Admitting: Neurology

## 2017-04-23 DIAGNOSIS — R0683 Snoring: Secondary | ICD-10-CM | POA: Diagnosis present

## 2017-04-23 DIAGNOSIS — G4730 Sleep apnea, unspecified: Secondary | ICD-10-CM | POA: Insufficient documentation

## 2017-04-23 DIAGNOSIS — R5383 Other fatigue: Secondary | ICD-10-CM | POA: Diagnosis not present

## 2017-04-23 DIAGNOSIS — G4700 Insomnia, unspecified: Secondary | ICD-10-CM | POA: Insufficient documentation

## 2017-04-23 DIAGNOSIS — R948 Abnormal results of function studies of other organs and systems: Secondary | ICD-10-CM | POA: Insufficient documentation

## 2017-04-26 NOTE — Procedures (Signed)
[place] A. Merlene Laughter, MD     [email or web address]             NOCTURNAL POLYSOMNOGRAPHY   LOCATION: [facility]  Patient Name: Lindsay, Bonilla Date: 04/23/2017 Gender: Female D.O.B: [birth year] Age (years): 56 Referring Provider: [name] Height (inches): 64 Interpreting Physician: Phillips Odor MD, ABSM Weight (lbs): 162 RPSGT: Rosebud Poles BMI: 28 MRN: [id number] Neck Size: 16.00 CLINICAL INFORMATION Sleep Study Type: NPSG     Indication for sleep study: Fatigue, Snoring     Epworth Sleepiness Score: 13     SLEEP STUDY TECHNIQUE As per the AASM Manual for the Scoring of Sleep and Associated Events v2.3 (April 2016) with a hypopnea requiring 4% desaturations.  The channels recorded and monitored were frontal, central and occipital EEG, electrooculogram (EOG), submentalis EMG (chin), nasal and oral airflow, thoracic and abdominal wall motion, anterior tibialis EMG, snore microphone, electrocardiogram, and pulse oximetry.  MEDICATIONS Medications self-administered by patient taken the night of the study : N/A  Current Outpatient Medications:  .  albuterol (PROVENTIL) (2.5 MG/3ML) 0.083% nebulizer solution, Take 2.5 mg by nebulization every 6 (six) hours as needed for wheezing or shortness of breath., Disp: , Rfl:  .  albuterol (VENTOLIN HFA) 108 (90 BASE) MCG/ACT inhaler, Inhale 1 puff into the lungs 4 (four) times daily as needed. For shortness of breath, Disp: , Rfl:  .  ALPRAZolam (XANAX) 1 MG tablet, Take 1 mg by mouth 4 (four) times daily as needed. For anxiety, Disp: , Rfl:  .  amoxicillin-clavulanate (AUGMENTIN) 875-125 MG tablet, Take 1 tablet by mouth 2 (two) times daily., Disp: 20 tablet, Rfl: 0 .  aspirin EC 81 MG tablet, Take 1 tablet (81 mg total) by mouth daily., Disp: , Rfl:  .  budesonide-formoterol (SYMBICORT) 160-4.5 MCG/ACT inhaler, Inhale 2 puffs into the lungs 2 (two) times daily., Disp: , Rfl:  .  cetirizine  (ZYRTEC) 10 MG tablet, Take 10 mg by mouth every morning. , Disp: , Rfl:  .  diltiazem (CARDIZEM CD) 360 MG 24 hr capsule, Take 1 capsule (360 mg total) by mouth daily., Disp: 90 capsule, Rfl: 2 .  EPINEPHrine (EPIPEN) 0.3 mg/0.3 mL DEVI, Inject 0.3 mg into the muscle as needed. For allergic reaction, Disp: , Rfl:  .  escitalopram (LEXAPRO) 10 MG tablet, Take 10 mg by mouth daily., Disp: , Rfl:  .  Ferrous Gluconate (IRON 27 PO), Take 1 tablet by mouth daily., Disp: , Rfl:  .  furosemide (LASIX) 80 MG tablet, Take 1 tablet (80 mg total) 2 (two) times daily by mouth., Disp: 60 tablet, Rfl: 6 .  losartan (COZAAR) 25 MG tablet, Take 1 tablet (25 mg total) by mouth daily., Disp: 90 tablet, Rfl: 3 .  metolazone (ZAROXOLYN) 2.5 MG tablet, Take one tablet by mouth on Monday, Wednesday and Friday, Disp: 12 tablet, Rfl: 3 .  montelukast (SINGULAIR) 10 MG tablet, Take 10 mg by mouth at bedtime. , Disp: , Rfl:  .  Olopatadine HCl (PATADAY) 0.2 % SOLN, Place 1 drop into both eyes as needed (FOR ALLERGIES). , Disp: , Rfl:  .  omeprazole (PRILOSEC) 20 MG capsule, Take 20 mg by mouth 3 (three) times daily. , Disp: , Rfl:  .  ondansetron (ZOFRAN) 4 MG tablet, Take 1 tablet (4 mg total) by mouth every 6 (six) hours., Disp: 12 tablet, Rfl: 0 .  oxyCODONE (OXYCONTIN) 20 mg 12 hr tablet, Take 20 mg by mouth every 12 (twelve) hours., Disp: , Rfl:  .  oxyCODONE-acetaminophen (PERCOCET) 10-325 MG tablet, Take 1 tablet by mouth every 4 (four) hours as needed for pain., Disp: , Rfl:  .  potassium chloride SA (K-DUR,KLOR-CON) 20 MEQ tablet, Take 80 mEq by mouth 2 (two) times daily., Disp: , Rfl:  .  pravastatin (PRAVACHOL) 40 MG tablet, Take 1 tablet (40 mg total) by mouth every evening., Disp: 90 tablet, Rfl: 2 .  SUMAtriptan (IMITREX) 100 MG tablet, Take 100 mg by mouth as needed for migraine (patient states the bottle doesnt have a time based frequency). For migraines, Disp: , Rfl:  .  Tiotropium Bromide Monohydrate  (SPIRIVA RESPIMAT) 1.25 MCG/ACT AERS, Inhale 2 sprays into the lungs daily. , Disp: , Rfl:  .  warfarin (COUMADIN) 5 MG tablet, Take 1 tablet daily except 1/2 tablet on Tuesdays, Disp: 35 tablet, Rfl: 4      SLEEP ARCHITECTURE The study was initiated at 10:52:53 PM and ended at 5:35:46 AM.  Sleep onset time was 23.7 minutes and the sleep efficiency was 92.7%%. The total sleep time was 373.5 minutes.  Stage REM latency was N/A minutes.  The patient spent 1.7%% of the night in stage N1 sleep, 98.3%% in stage N2 sleep, 0.0%% in stage N3 and 0.00% in REM.  Alpha intrusion was absent.  Supine sleep was 59.84%.  RESPIRATORY PARAMETERS The overall apnea/hypopnea index (AHI) was 0.3 per hour. There were 1 total apneas, including 0 obstructive, 1 central and 0 mixed apneas. There were 1 hypopneas and 0 RERAs.  The AHI during Stage REM sleep was N/A per hour.  AHI while supine was 0.5 per hour.  The mean oxygen saturation was 88.5%. The minimum SpO2 during sleep was 77.0%.  loud snoring was noted during this study.  CARDIAC DATA The 2 lead EKG demonstrated sinus rhythm. The mean heart rate was N/A beats per minute. Other EKG findings include: PVCs. LEG MOVEMENT DATA The total PLMS were 0 with a resulting PLMS index of 0.0. Associated arousal with leg movement index was 0.0.    IMPRESSIONS 1. 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  Otherwise, the recording is unrevealing.  There is no evidence of sleep disordered breathing.  Delano Metz, MD Diplomate, American Board of Sleep Medicine.  ELECTRONICALLY SIGNED ON:  04/26/2017, 12:46 PM [place] PH: [phone]   FX: [phone] [place]

## 2017-04-28 ENCOUNTER — Ambulatory Visit (INDEPENDENT_AMBULATORY_CARE_PROVIDER_SITE_OTHER): Payer: Medicaid Other | Admitting: *Deleted

## 2017-04-28 DIAGNOSIS — I359 Nonrheumatic aortic valve disorder, unspecified: Secondary | ICD-10-CM | POA: Diagnosis not present

## 2017-04-28 DIAGNOSIS — I4892 Unspecified atrial flutter: Secondary | ICD-10-CM

## 2017-04-28 DIAGNOSIS — Z952 Presence of prosthetic heart valve: Secondary | ICD-10-CM | POA: Diagnosis not present

## 2017-04-28 DIAGNOSIS — Z5181 Encounter for therapeutic drug level monitoring: Secondary | ICD-10-CM

## 2017-04-28 LAB — POCT INR: INR: 2.5

## 2017-04-28 NOTE — Patient Instructions (Signed)
Continue coumadin 5mg  daily except 2.5mg  on Tuesdays and Fridays Recheck in 3 weeks

## 2017-05-02 ENCOUNTER — Other Ambulatory Visit: Payer: Self-pay | Admitting: Cardiovascular Disease

## 2017-05-02 DIAGNOSIS — I5032 Chronic diastolic (congestive) heart failure: Secondary | ICD-10-CM

## 2017-05-06 ENCOUNTER — Other Ambulatory Visit: Payer: Self-pay | Admitting: Cardiovascular Disease

## 2017-05-06 DIAGNOSIS — I5032 Chronic diastolic (congestive) heart failure: Secondary | ICD-10-CM

## 2017-05-07 IMAGING — CR DG CHEST 1V PORT
1 series · 1 of 1 positions shown · non-contrast
Comparison: PA and lateral chest 06/11/2015.

CLINICAL DATA: Status post aortic valve replacement today.

EXAM:
PORTABLE CHEST 1 VIEW

[AP]
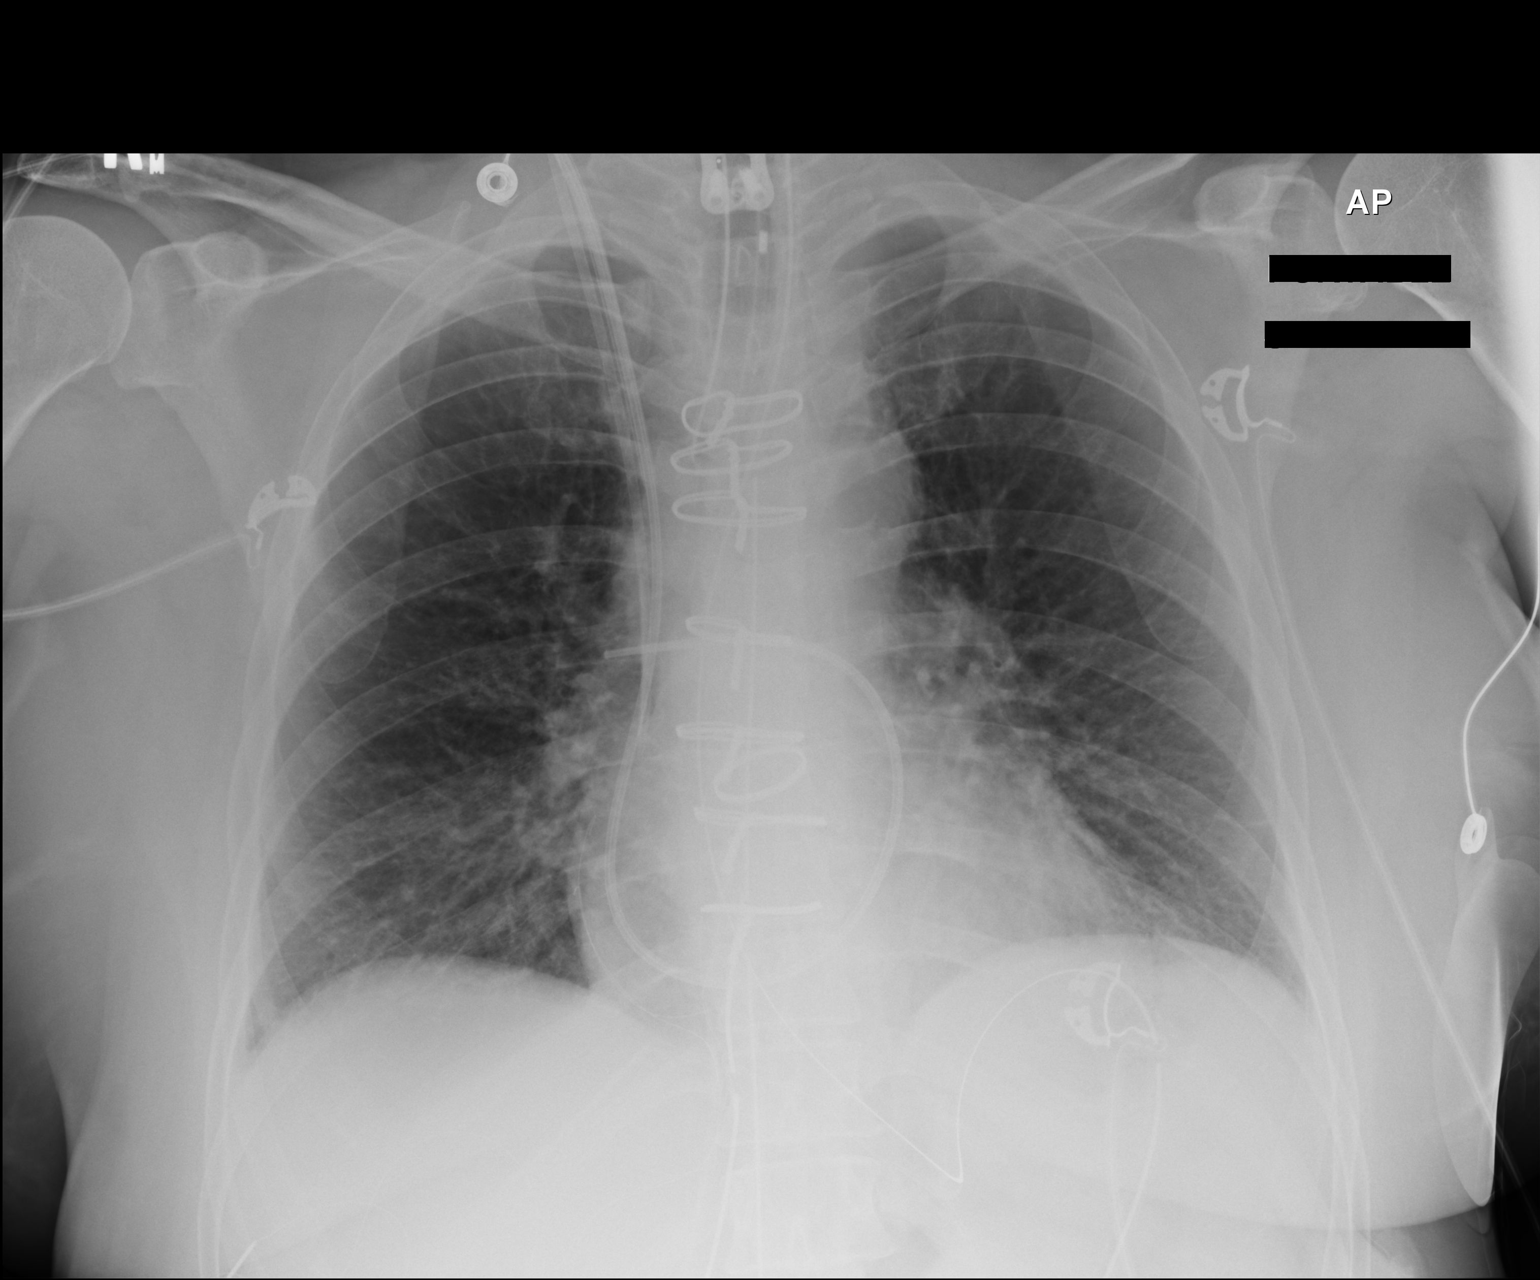

[1 of 1 positions shown; findings below may reference images not displayed]

FINDINGS: Endotracheal tube is in place with the tip just below the clavicular
heads and well above the carina. Right IJ catheter tip projects at
the superior cavoatrial junction. Right IJ approach Swan-Ganz
catheter tip is in the distal right main pulmonary artery. NG tube
is in good position in the stomach with the tip below the inferior
margin of film. Seven intact median sternotomy wires are identified.
There is no pneumothorax. Mild basilar atelectasis is noted. No
pleural effusion. Heart size is normal.
IMPRESSION: Support apparatus in good position.  No pneumothorax.

Mild bibasilar atelectasis.

## 2017-05-23 ENCOUNTER — Ambulatory Visit (INDEPENDENT_AMBULATORY_CARE_PROVIDER_SITE_OTHER): Payer: Medicaid Other | Admitting: *Deleted

## 2017-05-23 DIAGNOSIS — Z5181 Encounter for therapeutic drug level monitoring: Secondary | ICD-10-CM

## 2017-05-23 DIAGNOSIS — I4892 Unspecified atrial flutter: Secondary | ICD-10-CM | POA: Diagnosis not present

## 2017-05-23 DIAGNOSIS — Z952 Presence of prosthetic heart valve: Secondary | ICD-10-CM | POA: Diagnosis not present

## 2017-05-23 DIAGNOSIS — I359 Nonrheumatic aortic valve disorder, unspecified: Secondary | ICD-10-CM | POA: Diagnosis not present

## 2017-05-23 LAB — POCT INR: INR: 1.8

## 2017-05-23 NOTE — Patient Instructions (Signed)
Take 5mg   tonight then resume 5mg  daily except 2.5mg  on Tuesdays and Fridays Recheck in 3 weeks

## 2017-05-26 DIAGNOSIS — M47812 Spondylosis without myelopathy or radiculopathy, cervical region: Secondary | ICD-10-CM | POA: Insufficient documentation

## 2017-05-31 ENCOUNTER — Telehealth: Payer: Self-pay | Admitting: *Deleted

## 2017-05-31 NOTE — Telephone Encounter (Signed)
New Message:    Estill Bamberg calling from [facility] just to let you know that there will not be any anesthetic.

## 2017-05-31 NOTE — Telephone Encounter (Addendum)
   [facility] Pre-operative Risk Assessment    Request for surgical clearance:  1. What type of surgery is being performed? CERVICAL FACET (INJECTION)  2. When is this surgery scheduled? 06/20/17   3. Are there any medications that need to be held prior to surgery and how long? WARAFARIN and ASA  4. Practice name and name of physician performing surgery? [facility]; NO DOCTOR LISTED   5. What is your office phone and fax number? PH# [phone] EXT [phone]; FAX# [phone]   3. Anesthesia type (None, local, MAC, general) ? PER [facility] NO ANESTHESIA WILL BE USED    Julaine Hua 05/31/2017, 4:18 PM  _________________________________________________________________   (provider comments below)

## 2017-06-02 ENCOUNTER — Other Ambulatory Visit: Payer: Self-pay | Admitting: Cardiovascular Disease

## 2017-06-02 NOTE — Telephone Encounter (Signed)
OK to hold ASA. Lindsay Bonilla  

## 2017-06-02 NOTE — Telephone Encounter (Signed)
   Patient of Dr. [name].  She is scheduled for a cervical facet injection on 06/20/2017.  They request holding the warfarin and the aspirin.  I will route this to the Pharmacy Pool to address the warfarin.  I will route this to Dr. [name] to address the aspirin.  Tymira Horkey, PA-C 06/02/2017 4:58 PM [place] [phone]'

## 2017-06-04 NOTE — Telephone Encounter (Signed)
Patient with diagnosis of atrial flutter with mechanical AVR on warfarin for anticoagulation.    Procedure: cervical facet injection Date of procedure: 06/20/17  CHADS2-VASc score of  3 (CHF, , CAD,, female)  CrCl 78.5 Platelet count 242  Per office protocol, patient can hold warfarin for 5 days prior to procedure.    Patient will need bridging with Lovenox (enoxaparin) around procedure.  INR followed by [facility] in [place].  Have sent a message to that Coumadin clinic to arrange bridging.

## 2017-06-06 ENCOUNTER — Other Ambulatory Visit: Payer: Self-pay | Admitting: Cardiovascular Disease

## 2017-06-06 DIAGNOSIS — E785 Hyperlipidemia, unspecified: Secondary | ICD-10-CM

## 2017-06-06 NOTE — Telephone Encounter (Signed)
   Primary Cardiologist: Lauree Chandler, MD  Chart reviewed as part of pre-operative protocol coverage. Given past medical history and time since last visit, based on ACC/AHA guidelines, Lindsay Bonilla would be at acceptable risk for the planned procedure without further cardiovascular testing. Per phone call she is having no new symptoms.   I will route this recommendation to the requesting party via Epic fax function and remove from pre-op pool.  See attached anticoagulation recommendations. The patient will need bridging and she has an appointment for next Tuesday with the coumadin clinic to arrange.  It is OK to hold aspirin for the procedure per Dr. [name].   Please call with questions.  Daune Perch, NP 06/06/2017, 1:46 PM

## 2017-06-13 ENCOUNTER — Other Ambulatory Visit: Payer: Self-pay | Admitting: Physician Assistant

## 2017-06-13 ENCOUNTER — Ambulatory Visit (INDEPENDENT_AMBULATORY_CARE_PROVIDER_SITE_OTHER): Payer: Medicaid Other | Admitting: *Deleted

## 2017-06-13 DIAGNOSIS — Z5181 Encounter for therapeutic drug level monitoring: Secondary | ICD-10-CM | POA: Diagnosis not present

## 2017-06-13 DIAGNOSIS — I4892 Unspecified atrial flutter: Secondary | ICD-10-CM

## 2017-06-13 DIAGNOSIS — Z952 Presence of prosthetic heart valve: Secondary | ICD-10-CM

## 2017-06-13 DIAGNOSIS — I359 Nonrheumatic aortic valve disorder, unspecified: Secondary | ICD-10-CM

## 2017-06-13 LAB — POCT INR: INR: 3

## 2017-06-13 MED ORDER — ENOXAPARIN SODIUM 60 MG/0.6ML ~~LOC~~ SOLN: 60.0000 mg | Freq: Two times a day (BID) | SUBCUTANEOUS | 0 refills | Status: DC

## 2017-06-13 NOTE — Patient Instructions (Signed)
06/20/17  Cervical Facet Injection  Wt 68kg  CrCl 78.5  Plts 242K  Hgb 14.0   Hct 43.8  4/16  INR 3.0  4/17  Take last dose of coumadin 4/18  No lovenox or coumadin 4/19  Lovenox 60mg  SQ @ 7am & 7pm 4/20  Lovenox 60mg  SQ @ 7am & 7pm 4/21  Lovenox 60mg  SQ @ 7am & 7pm 4/22  No Lovenox in am or pm 4/23  No Lovenox in am--------procedure--------coumadin 5mg  pm 4/24  Lovenox 60mg  SQ @ 7am & 7pm & coumadin 5mg  pm 4/25  Lovenox 60mg  SQ @ 7am & 7pm & coumadin 5mg  pm 4/26   Lovenox 60mg  SQ @ 7am & 7pm & coumadin 5mg  pm 4/27   Lovenox 60mg  SQ @ 7am & 7pm & coumadin 5mg  pm 4/28   Lovenox 60mg  SQ @ 7am & 7pm & coumadin 5mg  pm 4/29   Lovenox 60mg  SQ @ 7am & 7pm & coumadin 5mg  pm 4/30   Lovenox 60mg  SQ @ 7am ----------INR appt @ 3:15pm

## 2017-06-15 ENCOUNTER — Telehealth: Payer: Self-pay | Admitting: *Deleted

## 2017-06-15 NOTE — Telephone Encounter (Signed)
LMOM for pt.  Per clearance note from Dr Julianne Handler it is ok for pt to stop ASA 5 days before procedure and restart after procedure.  Coumadin is being held as well and pt is bridging with Lovenox.

## 2017-06-15 NOTE — Telephone Encounter (Signed)
Has question about taking aspirin

## 2017-06-18 ENCOUNTER — Encounter (HOSPITAL_COMMUNITY): Payer: Self-pay | Admitting: Emergency Medicine

## 2017-06-18 ENCOUNTER — Emergency Department (HOSPITAL_COMMUNITY)
Admission: EM | Admit: 2017-06-18 | Discharge: 2017-06-18 | Disposition: A | Discharge status: Home / Self Care | Payer: Medicaid Other | Attending: Emergency Medicine | Admitting: Emergency Medicine

## 2017-06-18 DIAGNOSIS — R21 Rash and other nonspecific skin eruption: Secondary | ICD-10-CM | POA: Insufficient documentation

## 2017-06-18 DIAGNOSIS — Z7901 Long term (current) use of anticoagulants: Secondary | ICD-10-CM | POA: Insufficient documentation

## 2017-06-18 DIAGNOSIS — Z79899 Other long term (current) drug therapy: Secondary | ICD-10-CM | POA: Insufficient documentation

## 2017-06-18 DIAGNOSIS — I509 Heart failure, unspecified: Secondary | ICD-10-CM | POA: Diagnosis not present

## 2017-06-18 DIAGNOSIS — J449 Chronic obstructive pulmonary disease, unspecified: Secondary | ICD-10-CM | POA: Diagnosis not present

## 2017-06-18 DIAGNOSIS — Z87891 Personal history of nicotine dependence: Secondary | ICD-10-CM | POA: Insufficient documentation

## 2017-06-18 DIAGNOSIS — I259 Chronic ischemic heart disease, unspecified: Secondary | ICD-10-CM | POA: Insufficient documentation

## 2017-06-18 MED ORDER — DIPHENHYDRAMINE HCL 25 MG PO CAPS
25.0000 mg | ORAL_CAPSULE | Freq: Once | ORAL | Status: AC
  Administered 2017-06-18: 25 mg via ORAL
  Filled 2017-06-18: qty 1

## 2017-06-18 MED ORDER — FAMOTIDINE 20 MG PO TABS: 20.0000 mg | ORAL_TABLET | Freq: Two times a day (BID) | ORAL | 0 refills | Status: DC

## 2017-06-18 MED ORDER — FAMOTIDINE 20 MG PO TABS
20.0000 mg | ORAL_TABLET | Freq: Once | ORAL | Status: AC
  Administered 2017-06-18: 20 mg via ORAL
  Filled 2017-06-18: qty 1

## 2017-06-18 MED ORDER — METHYLPREDNISOLONE SODIUM SUCC 125 MG IJ SOLR
80.0000 mg | Freq: Once | INTRAMUSCULAR | Status: AC
  Administered 2017-06-18: 80 mg via INTRAMUSCULAR
  Filled 2017-06-18: qty 2

## 2017-06-18 MED ORDER — HYDROCORTISONE 1 % EX CREA: TOPICAL_CREAM | CUTANEOUS | 0 refills | Status: DC

## 2017-06-18 MED ORDER — DIPHENHYDRAMINE HCL 25 MG PO TABS: 25.0000 mg | ORAL_TABLET | Freq: Four times a day (QID) | ORAL | 0 refills | Status: DC

## 2017-06-18 NOTE — Discharge Instructions (Signed)
Take Benadryl as directed.  Take Pepcid as directed.  Apply hydrocortisone cream to help with symptomatic relief.  As we discussed, you need to follow-up with your doctor tomorrow.  Return to the emergency department for any worsening rash, spreading of rash, spreading redness, fever, difficulty breathing, any other worsening or concerning symptoms.

## 2017-06-18 NOTE — ED Provider Notes (Signed)
[facility] EMERGENCY DEPARTMENT Provider Note   CSN: [id number] Arrival date & time: 06/18/17  1411     History   Chief Complaint Chief Complaint  Patient presents with  . Rash    HPI Lindsay Bonilla is a 57 y.o. female past medical history of COPD, CHF, GERD who presents for evaluation of rash across her abdomen that has been ongoing for the last several weeks.  Patient reports that for the last 2 weeks, she has had a pruritic, erythematous rash across the anterior aspect of her abdomen.  Patient reports that she took Benadryl at home with minimal improvement.  Patient reports that she will take sugar scrubs which will temporarily provide improvement in symptoms.  Patient reports that 3 weeks ago, prior to onset of symptoms, she did have a tick bite.  Patient reports that she had one tick bite noted to her face but states that she removed it immediately.  Additionally, patient had another tick bite on the side of her abdomen states that was removed after about an hour.  Patient went to see her primary care doctor when the rash initially broke out.  Primary care doctor was concerned that there may be an allergic reaction and gave her a steroid shot in the department.  Patient reports that since then, rashes continue to be pruritic.  Patient states that she also has alphagal allergy and states that she will sometimes break up into a rash whenever she is comes in contact with substances related to the allergy.  Patient is unsure if she has been in contact with substances.  She denies any fevers, nausea/vomiting, difficulty breathing, chest pain.  She denies any new medications.  She denies any new exposures, soaps, lotions, foods.  The history is provided by the patient.    Past Medical History:  Diagnosis Date  . Anxiety   . Aortic aneurysm ([place])   . Aortic insufficiency    a. s/p [place] with mechanical AVR 4/17 >> FU Echo 6/17: Mild LVH, EF 60-65%, normal wall motion, ventricular septum  with paradoxical septal motion, [employer] mechanical AVR functioning  normally with no regurgitation (mean 6 mmHg), MAC, trivial MR, mild RVE, trivial TR  . Aortic stenosis, severe    Bicuspid valve  . Arthritis   . Asthma   . Bronchitis   . CHF (congestive heart failure) ([place])   . COPD (chronic obstructive pulmonary disease) ([place])   . DDD (degenerative disc disease), cervical   . Depression   . Dysrhythmia    palpitations  . GERD (gastroesophageal reflux disease)   . Hard of hearing   . History of pneumonia   . 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 Migraine headache   . Palpitations   . PONV (postoperative nausea and vomiting)    "only once"  . Shortness of breath    Nodule left lung CT done 8/6  . Stress incontinence   . Urinary frequency    due to Lasix  . Valvular heart disease     Patient Active Problem List   Diagnosis Date Noted  . Chronic diastolic CHF (congestive heart failure) ([place]) 05/05/2016  . Hyperlipidemia 07/10/2015  . Encounter for therapeutic drug monitoring 06/25/2015  . Aortic valve replaced 06/25/2015  . Atrial flutter ([place]) 06/21/2015  . S/P aortic valve replacement 06/15/2015  . S/P aortic valve replacement and aortoplasty 06/15/2015  . Aortic valve disease   . CAD (coronary artery disease), native coronary artery 08/29/2012  .  TOBACCO USER 02/25/2009  . Aneurysm of thoracic aorta ([place]) 02/25/2009  . DYSPNEA ON EXERTION 08/29/2008  . MIGRAINE HEADACHE 08/21/2008  . VALVULAR HEART DISEASE 08/21/2008  . BRONCHITIS 08/21/2008  . ASTHMA 08/21/2008  . COPD (chronic obstructive pulmonary disease) ([place]) 08/21/2008  . ARTHRITIS 08/21/2008  . PALPITATIONS 08/21/2008    Past Surgical History:  Procedure Laterality Date  . ABDOMINAL HYSTERECTOMY    . ANTERIOR CERVICAL DECOMP/DISCECTOMY FUSION  10/05/2011   Procedure: ANTERIOR CERVICAL DECOMPRESSION/DISCECTOMY FUSION 2 LEVELS;  Surgeon: Floyce Stakes, MD;  Location: [facility]  NEURO ORS;  Service: Neurosurgery;  Laterality: N/A;  Cervical five - six , six - seven Anterior cervical decompression/diskectomy/fusion/plate  . ANTERIOR CRUCIATE LIGAMENT REPAIR Right   . AORTIC VALVE REPLACEMENT    . ARTHROSCOPIC REPAIR ACL  rt  . BENTALL PROCEDURE N/A 06/15/2015   Procedure: BENTALL PROCEDURE; HEMI-ARCH REPAIR WITH #23 ST JUDE MECHANICAL AVR CONDUIT AND #28 HEMASHIELD PLATINUM GRAFT;  Surgeon: Gaye Pollack, MD;  Location: [facility];  Service: Open Heart Surgery;  Laterality: N/A;  . CARDIAC CATHETERIZATION N/A 05/21/2015   Procedure: Right/Left Heart Cath and Coronary Angiography;  Surgeon: [name], MD;  Location: [facility];  Service: Cardiovascular;  Laterality: N/A;  . CHOLECYSTECTOMY    . COLONOSCOPY  10/13/05  . DIAGNOSTIC LAPAROSCOPY    . JOINT REPLACEMENT Right 2016  . KNEE ARTHROSCOPY     rt x4  x1 lft  . MYRINGOPLASTY W/ FAT GRAFT     graft from behind ear  . SIGMOIDOSCOPY  10/13/05, 09/19/05  . TEE WITHOUT CARDIOVERSION N/A 06/15/2015   Procedure: TRANSESOPHAGEAL ECHOCARDIOGRAM (TEE);  Surgeon: Gaye Pollack, MD;  Location: [facility];  Service: Open Heart Surgery;  Laterality: N/A;  . TONSILLECTOMY  2005  . TUBAL LIGATION    . TYMPANOSTOMY TUBE PLACEMENT       OB History    Gravida      Para      Term      Preterm      AB      Living  3     SAB      TAB      Ectopic      Multiple      Live Births               Home Medications    Prior to Admission medications   Medication Sig Start Date End Date Taking? Authorizing Provider  albuterol (PROVENTIL) (2.5 MG/3ML) 0.083% nebulizer solution Take 2.5 mg by nebulization every 6 (six) hours as needed for wheezing or shortness of breath.    [provider]  albuterol (VENTOLIN HFA) 108 (90 BASE) MCG/ACT inhaler Inhale 1 puff into the lungs 4 (four) times daily as needed. For shortness of breath    [provider]  ALPRAZolam (XANAX) 1 MG tablet Take 1 mg  by mouth 4 (four) times daily as needed. For anxiety    [provider]  amoxicillin-clavulanate (AUGMENTIN) 875-125 MG tablet Take 1 tablet by mouth 2 (two) times daily. 11/02/16   Orpah Greek, MD  aspirin EC 81 MG tablet Take 1 tablet (81 mg total) by mouth daily. 07/23/15   Richardson Dopp T, PA-C  budesonide-formoterol (SYMBICORT) 160-4.5 MCG/ACT inhaler Inhale 2 puffs into the lungs 2 (two) times daily.    [provider]  cetirizine (ZYRTEC) 10 MG tablet Take 10 mg by mouth every morning.     [provider]  diphenhydrAMINE (  BENADRYL) 25 MG tablet Take 1 tablet (25 mg total) by mouth every 6 (six) hours for 3 days. 06/18/17 06/21/17  Volanda Napoleon, PA-C  enoxaparin (LOVENOX) 60 MG/0.6ML injection Inject 0.6 mLs (60 mg total) into the skin every 12 (twelve) hours. 06/13/17   Herminio Commons, MD  EPINEPHrine (EPIPEN) 0.3 mg/0.3 mL DEVI Inject 0.3 mg into the muscle as needed. For allergic reaction    [provider]  escitalopram (LEXAPRO) 10 MG tablet Take 10 mg by mouth daily.    [provider]  famotidine (PEPCID) 20 MG tablet Take 1 tablet (20 mg total) by mouth 2 (two) times daily for 3 days. 06/18/17 06/21/17  Volanda Napoleon, PA-C  Ferrous Gluconate (IRON 27 PO) Take 1 tablet by mouth daily.    [provider]  furosemide (LASIX) 80 MG tablet Take 1 tablet (80 mg total) 2 (two) times daily by mouth. 01/13/17 04/13/17  [name], MD  hydrocortisone cream 1 % Apply to affected area 2 times daily 06/18/17   [place] Lanius A, PA-C  losartan (COZAAR) 25 MG tablet TAKE 1 TABLET ONCE DAILY. 06/02/17   [name], MD  metolazone (ZAROXOLYN) 2.5 MG tablet TAKE 1 TABLET BY MOUTH ON MONDAY, [facility] AND FRIDAY. 06/02/17   [name], MD  montelukast (SINGULAIR) 10 MG tablet Take 10 mg by mouth at bedtime.     [provider]  Olopatadine HCl (PATADAY) 0.2 % SOLN Place 1 drop into both eyes  as needed (FOR ALLERGIES).     [provider]  omeprazole (PRILOSEC) 20 MG capsule Take 20 mg by mouth 3 (three) times daily.     [provider]  ondansetron (ZOFRAN) 4 MG tablet Take 1 tablet (4 mg total) by mouth every 6 (six) hours. 11/02/16   Orpah Greek, MD  oxyCODONE (OXYCONTIN) 20 mg 12 hr tablet Take 20 mg by mouth every 12 (twelve) hours.    [provider]  oxyCODONE-acetaminophen (PERCOCET) 10-325 MG tablet Take 1 tablet by mouth every 4 (four) hours as needed for pain.    [provider]  potassium chloride SA (K-DUR,KLOR-CON) 20 MEQ tablet Take 4 tablets (80 mEq total) by mouth 2 (two) times daily. 06/14/17   Imogene Burn, PA-C  pravastatin (PRAVACHOL) 40 MG tablet TAKE 1 TABLET IN THE EVENING. 06/07/17   [name], MD  SUMAtriptan (IMITREX) 100 MG tablet Take 100 mg by mouth as needed for migraine (patient states the bottle doesnt have a time based frequency). For migraines    [provider]  TAZTIA XT 360 MG 24 hr capsule TAKE 1 CAPSULE BY MOUTH ONCE DAILY. 05/08/17   [name], MD  Tiotropium Bromide Monohydrate (SPIRIVA RESPIMAT) 1.25 MCG/ACT AERS Inhale 2 sprays into the lungs daily.     [provider]  warfarin (COUMADIN) 5 MG tablet Take 1 tablet daily except 1/2 tablet on Tuesdays 02/27/17   [name], MD    Family History Family History  Problem Relation Age of Onset  . Heart attack Father   . Cancer Mother        Type unknown  . Diabetes Unknown        family history  . Asthma Brother   . Hyperlipidemia Unknown        family history    Social History Social History   Tobacco Use  . Smoking status: Former Smoker    Packs/day: 0.10    Years:  30.00    Pack years: 3.00    Types: Cigarettes    Last attempt to quit: 04/29/2015    Years since quitting: 2.1  . Smokeless tobacco: Never Used  Substance Use Topics  . Alcohol use: No    Alcohol/week: 0.0 oz    . Drug use: No     Allergies   Beta adrenergic blockers; Doxycycline; Peanut-containing drug products; Shrimp [shellfish allergy]; Vancomycin; Avelox [moxifloxacin hcl in nacl]; Ciprofloxacin; Cleocin [clindamycin hcl]; Moxifloxacin; Sulfamethoxazole-trimethoprim; and Tape   Review of Systems Review of Systems  Constitutional: Negative for fever.  Respiratory: Negative for shortness of breath.   Cardiovascular: Negative for chest pain.  Gastrointestinal: Negative for nausea and vomiting.  Skin: Positive for rash.     Physical Exam Updated Vital Signs BP (!) 130/99 (BP Location: Right Arm)   Pulse 94   Temp 98.1 F (36.7 C) (Oral)   Resp 20   Wt 74.8 kg (165 lb)   SpO2 97%   BMI 28.32 kg/m   Physical Exam  Constitutional: She appears well-developed and well-nourished.  HENT:  Head: Normocephalic and atraumatic.  Mouth/Throat: Uvula is midline, oropharynx is clear and moist and mucous membranes are normal.  Airways patent, phonation is normal.  No oral lesions noted.  Eyes: Conjunctivae and EOM are normal. Right eye exhibits no discharge. Left eye exhibits no discharge. No scleral icterus.  Pulmonary/Chest: Effort normal and breath sounds normal. She has no wheezes.  No evidence of respiratory distress. Able to speak in full sentences without difficulty.  Neurological: She is alert.  Skin: Skin is warm and dry. Capillary refill takes less than 2 seconds.  Diffuse erythematous rash noted across the anterior aspect of the abdomen.  Does not radiate to the back.  No rash noted on palms, soles.  No evidence of erythema migrans.  Psychiatric: She has a normal mood and affect. Her speech is normal and behavior is normal.  Nursing note and vitals reviewed.    ED Treatments / Results  Labs (all labs ordered are listed, but only abnormal results are displayed) Labs Reviewed - No data to display  EKG None  Radiology No results found.  Procedures Procedures (including  critical care time)  Medications Ordered in ED Medications  methylPREDNISolone sodium succinate (SOLU-MEDROL) 125 mg/2 mL injection 80 mg (80 mg Intramuscular Given 06/18/17 1514)  famotidine (PEPCID) tablet 20 mg (20 mg Oral Given 06/18/17 1514)  diphenhydrAMINE (BENADRYL) capsule 25 mg (25 mg Oral Given 06/18/17 1514)     Initial Impression / Assessment and Plan / ED Course  I have reviewed the triage vital signs and the nursing notes.  Pertinent labs & imaging results that were available during my care of the patient were reviewed by me and considered in my medical decision making (see chart for details).     57 year old female who presents for evaluation of rash that is been on her anterior abdomen.  Reports rash is pruritic.  States few days prior to onset of rash, she had a tick on her and had it removed.  Patient was seen by her primary care doctor the next day for evaluation of rash.  At that time, he thought it may be allergic reaction to the tick.  Patient was given a steroid shot and discharged home.  Patient reports that she attempted to follow-up with her primary care doctor last week but states she cannot get in contact with him.  She has taken over-the-counter Benadryl with minimal improvement.  Patient  reports she gets some relief with sugar scrubs at home.  Patient denies any fevers, nausea/vomiting. Patient is afebrile, non-toxic appearing, sitting comfortably on examination table. Vital signs reviewed and stable.  On exam, patient is a diffuse erythematous rash noted to the anterior aspect of her abdomen that does not extend posteriorly.  No other rash noted.  No rash noted on palms, soles.  No oral lesions.  Consider contact dermatitis versus allergic reaction.  History/physical exam is not concerning for SJS, TENS.  No evidence of erythema migraines that would be concern for [facility].  Plan for medications here in the ED.  Reevaluation of her medications.  Patient reports  improvement in rash. She reports it is still pruritic but has improved.  Reevaluation shows the erythema has improved significantly.  She has a small isolated area noted to the anterior abdomen where as before, I was spread across the entire abdomen.  Vital signs are stable.  Patient with no evidence of respiratory distress.  She is scheduled to follow-up with her primary care doctor this week.  We will plan to provide symptomatically relief for at home use.  Patient instructed to closely monitor her rash and return to the emergency department for any worsening concerning symptoms. Patient had ample opportunity for questions and discussion. All patient's questions were answered with full understanding. Strict return precautions discussed. Patient expresses understanding and agreement to plan.   Final Clinical Impressions(s) / ED Diagnoses   Final diagnoses:  Rash    ED Discharge Orders        Ordered    hydrocortisone cream 1 %     06/18/17 1628    diphenhydrAMINE (BENADRYL) 25 MG tablet  Every 6 hours     06/18/17 1628    famotidine (PEPCID) 20 MG tablet  2 times daily     06/18/17 1628       Volanda Napoleon, PA-C 06/18/17 [place], Rennert, DO 06/18/17 1749

## 2017-06-18 NOTE — ED Triage Notes (Signed)
Pt reports she had a few tick bites last week and then broke out in an itching rash across her abdomen and left arm.  Pt states she has an alphagal allergy from previous tick bite and does not know if the s/s are related or not.

## 2017-06-19 ENCOUNTER — Other Ambulatory Visit: Payer: Self-pay | Admitting: Cardiovascular Disease

## 2017-06-21 NOTE — Telephone Encounter (Signed)
Refill sent to pharmacy.   

## 2017-06-29 ENCOUNTER — Ambulatory Visit (INDEPENDENT_AMBULATORY_CARE_PROVIDER_SITE_OTHER): Payer: Medicaid Other | Admitting: *Deleted

## 2017-06-29 DIAGNOSIS — Z5181 Encounter for therapeutic drug level monitoring: Secondary | ICD-10-CM | POA: Diagnosis not present

## 2017-06-29 DIAGNOSIS — I4892 Unspecified atrial flutter: Secondary | ICD-10-CM | POA: Diagnosis not present

## 2017-06-29 DIAGNOSIS — Z952 Presence of prosthetic heart valve: Secondary | ICD-10-CM | POA: Diagnosis not present

## 2017-06-29 DIAGNOSIS — M79604 Pain in right leg: Secondary | ICD-10-CM

## 2017-06-29 DIAGNOSIS — M79605 Pain in left leg: Secondary | ICD-10-CM

## 2017-06-29 LAB — POCT INR: INR: 1.9

## 2017-06-29 NOTE — Patient Instructions (Signed)
Take coumadin 1 1/2 tablets tonight then resume 1 tablet daily except 1/2 tablet on Tuesdays and Fridays.  Recheck on 07/20/17

## 2017-08-01 ENCOUNTER — Ambulatory Visit (INDEPENDENT_AMBULATORY_CARE_PROVIDER_SITE_OTHER): Payer: Medicaid Other | Admitting: *Deleted

## 2017-08-01 DIAGNOSIS — Z5181 Encounter for therapeutic drug level monitoring: Secondary | ICD-10-CM

## 2017-08-01 DIAGNOSIS — Z952 Presence of prosthetic heart valve: Secondary | ICD-10-CM | POA: Diagnosis not present

## 2017-08-01 DIAGNOSIS — I4892 Unspecified atrial flutter: Secondary | ICD-10-CM

## 2017-08-01 LAB — POCT INR: INR: 1.6 — AB (ref 2.0–3.0)

## 2017-08-01 NOTE — Patient Instructions (Signed)
Increase coumadin to 1 tablet daily except 1/2 tablet on Fridays.  Recheck on 3 weeks

## 2017-08-13 ENCOUNTER — Other Ambulatory Visit: Payer: Self-pay | Admitting: Cardiovascular Disease

## 2017-08-22 ENCOUNTER — Ambulatory Visit (INDEPENDENT_AMBULATORY_CARE_PROVIDER_SITE_OTHER): Payer: Medicaid Other | Admitting: Pharmacist

## 2017-08-22 DIAGNOSIS — I4892 Unspecified atrial flutter: Secondary | ICD-10-CM

## 2017-08-22 DIAGNOSIS — I359 Nonrheumatic aortic valve disorder, unspecified: Secondary | ICD-10-CM | POA: Diagnosis not present

## 2017-08-22 DIAGNOSIS — Z5181 Encounter for therapeutic drug level monitoring: Secondary | ICD-10-CM | POA: Diagnosis not present

## 2017-08-22 DIAGNOSIS — Z952 Presence of prosthetic heart valve: Secondary | ICD-10-CM | POA: Diagnosis not present

## 2017-08-22 LAB — POCT INR: INR: 1.8 — AB (ref 2.0–3.0)

## 2017-08-22 NOTE — Patient Instructions (Signed)
Description   Take 1.5 tablets today then Increase coumadin to 1 tablet daily.  Recheck on 2 weeks

## 2017-09-07 ENCOUNTER — Ambulatory Visit (INDEPENDENT_AMBULATORY_CARE_PROVIDER_SITE_OTHER): Payer: Medicaid Other | Admitting: *Deleted

## 2017-09-07 DIAGNOSIS — I4892 Unspecified atrial flutter: Secondary | ICD-10-CM

## 2017-09-07 DIAGNOSIS — Z952 Presence of prosthetic heart valve: Secondary | ICD-10-CM

## 2017-09-07 DIAGNOSIS — Z5181 Encounter for therapeutic drug level monitoring: Secondary | ICD-10-CM

## 2017-09-07 LAB — POCT INR: INR: 3.2 — AB (ref 2.0–3.0)

## 2017-09-07 NOTE — Patient Instructions (Signed)
Take coumadin 1/2 tablet tonight then resume 1 tablet daily.  Recheck in 2 weeks

## 2017-09-21 ENCOUNTER — Ambulatory Visit (INDEPENDENT_AMBULATORY_CARE_PROVIDER_SITE_OTHER): Payer: Medicaid Other | Admitting: *Deleted

## 2017-09-21 DIAGNOSIS — I4892 Unspecified atrial flutter: Secondary | ICD-10-CM

## 2017-09-21 DIAGNOSIS — Z5181 Encounter for therapeutic drug level monitoring: Secondary | ICD-10-CM | POA: Diagnosis not present

## 2017-09-21 DIAGNOSIS — Z952 Presence of prosthetic heart valve: Secondary | ICD-10-CM

## 2017-09-21 LAB — POCT INR: INR: 2.7 (ref 2.0–3.0)

## 2017-09-21 NOTE — Patient Instructions (Signed)
Continue coumadin 1 tablet daily Recheck in 4 weeks 

## 2017-09-29 ENCOUNTER — Other Ambulatory Visit: Payer: Self-pay

## 2017-09-29 ENCOUNTER — Encounter (HOSPITAL_COMMUNITY): Payer: Self-pay | Admitting: Emergency Medicine

## 2017-09-29 ENCOUNTER — Inpatient Hospital Stay (HOSPITAL_COMMUNITY)
Admission: EM | Admit: 2017-09-29 | Discharge: 2017-09-30 | DRG: 917 | Disposition: A | Discharge status: Home / Self Care | Payer: Medicaid Other | Attending: Internal Medicine | Admitting: Internal Medicine

## 2017-09-29 DIAGNOSIS — M549 Dorsalgia, unspecified: Secondary | ICD-10-CM | POA: Diagnosis present

## 2017-09-29 DIAGNOSIS — W228XXA Striking against or struck by other objects, initial encounter: Secondary | ICD-10-CM | POA: Diagnosis present

## 2017-09-29 DIAGNOSIS — Z87891 Personal history of nicotine dependence: Secondary | ICD-10-CM

## 2017-09-29 DIAGNOSIS — T40601A Poisoning by unspecified narcotics, accidental (unintentional), initial encounter: Secondary | ICD-10-CM | POA: Diagnosis present

## 2017-09-29 DIAGNOSIS — Z91013 Allergy to seafood: Secondary | ICD-10-CM

## 2017-09-29 DIAGNOSIS — Z981 Arthrodesis status: Secondary | ICD-10-CM

## 2017-09-29 DIAGNOSIS — M79606 Pain in leg, unspecified: Secondary | ICD-10-CM

## 2017-09-29 DIAGNOSIS — Z881 Allergy status to other antibiotic agents status: Secondary | ICD-10-CM

## 2017-09-29 DIAGNOSIS — F418 Other specified anxiety disorders: Secondary | ICD-10-CM | POA: Diagnosis present

## 2017-09-29 DIAGNOSIS — Z882 Allergy status to sulfonamides status: Secondary | ICD-10-CM

## 2017-09-29 DIAGNOSIS — H9190 Unspecified hearing loss, unspecified ear: Secondary | ICD-10-CM | POA: Diagnosis present

## 2017-09-29 DIAGNOSIS — E785 Hyperlipidemia, unspecified: Secondary | ICD-10-CM | POA: Diagnosis present

## 2017-09-29 DIAGNOSIS — Z952 Presence of prosthetic heart valve: Secondary | ICD-10-CM

## 2017-09-29 DIAGNOSIS — G894 Chronic pain syndrome: Secondary | ICD-10-CM

## 2017-09-29 DIAGNOSIS — Z9981 Dependence on supplemental oxygen: Secondary | ICD-10-CM

## 2017-09-29 DIAGNOSIS — Z7901 Long term (current) use of anticoagulants: Secondary | ICD-10-CM

## 2017-09-29 DIAGNOSIS — Z91010 Allergy to peanuts: Secondary | ICD-10-CM

## 2017-09-29 DIAGNOSIS — Z79899 Other long term (current) drug therapy: Secondary | ICD-10-CM

## 2017-09-29 DIAGNOSIS — T402X1A Poisoning by other opioids, accidental (unintentional), initial encounter: Principal | ICD-10-CM | POA: Diagnosis present

## 2017-09-29 DIAGNOSIS — S99921A Unspecified injury of right foot, initial encounter: Secondary | ICD-10-CM | POA: Diagnosis present

## 2017-09-29 DIAGNOSIS — Z888 Allergy status to other drugs, medicaments and biological substances status: Secondary | ICD-10-CM

## 2017-09-29 DIAGNOSIS — G92 Toxic encephalopathy: Secondary | ICD-10-CM | POA: Diagnosis present

## 2017-09-29 DIAGNOSIS — E8729 Other acidosis: Secondary | ICD-10-CM

## 2017-09-29 DIAGNOSIS — G9340 Encephalopathy, unspecified: Secondary | ICD-10-CM

## 2017-09-29 DIAGNOSIS — M1990 Unspecified osteoarthritis, unspecified site: Secondary | ICD-10-CM | POA: Diagnosis present

## 2017-09-29 DIAGNOSIS — S92301A Fracture of unspecified metatarsal bone(s), right foot, initial encounter for closed fracture: Secondary | ICD-10-CM

## 2017-09-29 DIAGNOSIS — Z7951 Long term (current) use of inhaled steroids: Secondary | ICD-10-CM

## 2017-09-29 DIAGNOSIS — I5032 Chronic diastolic (congestive) heart failure: Secondary | ICD-10-CM | POA: Diagnosis present

## 2017-09-29 DIAGNOSIS — J449 Chronic obstructive pulmonary disease, unspecified: Secondary | ICD-10-CM | POA: Diagnosis present

## 2017-09-29 DIAGNOSIS — I110 Hypertensive heart disease with heart failure: Secondary | ICD-10-CM | POA: Diagnosis present

## 2017-09-29 DIAGNOSIS — I2510 Atherosclerotic heart disease of native coronary artery without angina pectoris: Secondary | ICD-10-CM | POA: Diagnosis present

## 2017-09-29 DIAGNOSIS — Z91048 Other nonmedicinal substance allergy status: Secondary | ICD-10-CM

## 2017-09-29 DIAGNOSIS — E876 Hypokalemia: Secondary | ICD-10-CM | POA: Diagnosis present

## 2017-09-29 DIAGNOSIS — Z79891 Long term (current) use of opiate analgesic: Secondary | ICD-10-CM

## 2017-09-29 DIAGNOSIS — K219 Gastro-esophageal reflux disease without esophagitis: Secondary | ICD-10-CM | POA: Diagnosis present

## 2017-09-29 DIAGNOSIS — E872 Acidosis: Secondary | ICD-10-CM | POA: Diagnosis present

## 2017-09-29 MED ORDER — NALOXONE HCL 0.4 MG/ML IJ SOLN
0.4000 mg | Freq: Once | INTRAMUSCULAR | Status: AC
  Administered 2017-09-30: 0.4 mg via INTRAVENOUS

## 2017-09-29 MED ORDER — NALOXONE HCL 0.4 MG/ML IJ SOLN
INTRAMUSCULAR | Status: AC
  Filled 2017-09-29: qty 1

## 2017-09-29 NOTE — ED Provider Notes (Signed)
[facility] EMERGENCY DEPARTMENT Provider Note   CSN: [id number] Arrival date & time: 09/29/17  2323     History   Chief Complaint No chief complaint on file.   HPI Lindsay Bonilla is a 57 y.o. female.  The history is provided by a relative. The history is limited by the condition of the patient (Altered mental status).  She has history of hyperlipidemia, asthma, COPD, coronary artery disease, diastolic heart failure, aortic valve replacement with chronic anticoagulation with warfarin and comes in with lethargy for the last 5 days.  She has been confused at home.  Family has noted that her feet were swelling.  She was taken to the emergency department at [facility] where an x-ray was obtained of the left foot and she was started on antibiotics for cellulitis.  Symptoms have been getting worse.  There has been no known fever or chills.  There is been no vomiting or diarrhea.  Of note, she is treated for chronic pain and takes oxycodone 20 mg twice a day as well as oxycodone-acetaminophen 10-325 every 4 hours as needed.  Family has noted new redness and bruising of the right foot and he thinks she may have dropped something on that foot.  Past Medical History:  Diagnosis Date  . Anxiety   . Aortic aneurysm ([place])   . Aortic insufficiency    a. s/p [place] with mechanical AVR 4/17 >> FU Echo 6/17: Mild LVH, EF 60-65%, normal wall motion, ventricular septum with paradoxical septal motion, [employer] mechanical AVR functioning  normally with no regurgitation (mean 6 mmHg), MAC, trivial MR, mild RVE, trivial TR  . Aortic stenosis, severe    Bicuspid valve  . Arthritis   . Asthma   . Bronchitis   . CHF (congestive heart failure) ([place])   . COPD (chronic obstructive pulmonary disease) ([place])   . DDD (degenerative disc disease), cervical   . Depression   . Dysrhythmia    palpitations  . GERD (gastroesophageal reflux disease)   . Hard of hearing   . History of pneumonia   . 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 Migraine headache   . Palpitations   . PONV (postoperative nausea and vomiting)    "only once"  . Shortness of breath    Nodule left lung CT done 8/6  . Stress incontinence   . Urinary frequency    due to Lasix  . Valvular heart disease     Patient Active Problem List   Diagnosis Date Noted  . Chronic diastolic CHF (congestive heart failure) ([place]) 05/05/2016  . Hyperlipidemia 07/10/2015  . Encounter for therapeutic drug monitoring 06/25/2015  . Aortic valve replaced 06/25/2015  . Atrial flutter ([place]) 06/21/2015  . S/P aortic valve replacement 06/15/2015  . S/P aortic valve replacement and aortoplasty 06/15/2015  . Aortic valve disease   . CAD (coronary artery disease), native coronary artery 08/29/2012  . TOBACCO USER 02/25/2009  . Aneurysm of thoracic aorta ([place]) 02/25/2009  . DYSPNEA ON EXERTION 08/29/2008  . MIGRAINE HEADACHE 08/21/2008  . VALVULAR HEART DISEASE 08/21/2008  . BRONCHITIS 08/21/2008  . ASTHMA 08/21/2008  . COPD (chronic obstructive pulmonary disease) ([place]) 08/21/2008  . ARTHRITIS 08/21/2008  . PALPITATIONS 08/21/2008    Past Surgical History:  Procedure Laterality Date  . ABDOMINAL HYSTERECTOMY    . ANTERIOR CERVICAL DECOMP/DISCECTOMY FUSION  10/05/2011   Procedure: ANTERIOR CERVICAL DECOMPRESSION/DISCECTOMY FUSION 2 LEVELS;  Surgeon: Floyce Stakes, MD;  Location: [facility]  NEURO ORS;  Service: Neurosurgery;  Laterality: N/A;  Cervical five - six , six - seven Anterior cervical decompression/diskectomy/fusion/plate  . ANTERIOR CRUCIATE LIGAMENT REPAIR Right   . AORTIC VALVE REPLACEMENT    . ARTHROSCOPIC REPAIR ACL  rt  . BENTALL PROCEDURE N/A 06/15/2015   Procedure: BENTALL PROCEDURE; HEMI-ARCH REPAIR WITH #23 ST JUDE MECHANICAL AVR CONDUIT AND #28 HEMASHIELD PLATINUM GRAFT;  Surgeon: Gaye Pollack, MD;  Location: [place] OR;  Service: Open Heart Surgery;  Laterality: N/A;  . CARDIAC CATHETERIZATION N/A  05/21/2015   Procedure: Right/Left Heart Cath and Coronary Angiography;  Surgeon: [name], MD;  Location: [place] [facility];  Service: Cardiovascular;  Laterality: N/A;  . CHOLECYSTECTOMY    . COLONOSCOPY  10/13/05  . DIAGNOSTIC LAPAROSCOPY    . JOINT REPLACEMENT Right 2016  . KNEE ARTHROSCOPY     rt x4  x1 lft  . MYRINGOPLASTY W/ FAT GRAFT     graft from behind ear  . SIGMOIDOSCOPY  10/13/05, 09/19/05  . TEE WITHOUT CARDIOVERSION N/A 06/15/2015   Procedure: TRANSESOPHAGEAL ECHOCARDIOGRAM (TEE);  Surgeon: Gaye Pollack, MD;  Location: [place];  Service: Open Heart Surgery;  Laterality: N/A;  . TONSILLECTOMY  2005  . TUBAL LIGATION    . TYMPANOSTOMY TUBE PLACEMENT       OB History    Gravida      Para      Term      Preterm      AB      Living  3     SAB      TAB      Ectopic      Multiple      Live Births               Home Medications    Prior to Admission medications   Medication Sig Start Date End Date Taking? Authorizing Provider  albuterol (PROVENTIL) (2.5 MG/3ML) 0.083% nebulizer solution Take 2.5 mg by nebulization every 6 (six) hours as needed for wheezing or shortness of breath.    [provider]  albuterol (VENTOLIN HFA) 108 (90 BASE) MCG/ACT inhaler Inhale 1 puff into the lungs 4 (four) times daily as needed. For shortness of breath    [provider]  ALPRAZolam (XANAX) 1 MG tablet Take 1 mg by mouth 4 (four) times daily as needed. For anxiety    [provider]  amoxicillin (AMOXIL) 500 MG capsule TAKE 4 CAPSULES 30 - 60 MINUTES PRIOR TO DENTAL APPOINTMENTS. 06/21/17   [name], MD  amoxicillin-clavulanate (AUGMENTIN) 875-125 MG tablet Take 1 tablet by mouth 2 (two) times daily. 11/02/16   Orpah Greek, MD  aspirin EC 81 MG tablet Take 1 tablet (81 mg total) by mouth daily. 07/23/15   Richardson Dopp T, PA-C  budesonide-formoterol (SYMBICORT) 160-4.5 MCG/ACT inhaler Inhale 2 puffs into the  lungs 2 (two) times daily.    [provider]  cetirizine (ZYRTEC) 10 MG tablet Take 10 mg by mouth every morning.     [provider]  diphenhydrAMINE (BENADRYL) 25 MG tablet Take 1 tablet (25 mg total) by mouth every 6 (six) hours for 3 days. 06/18/17 06/21/17  Volanda Napoleon, PA-C  enoxaparin (LOVENOX) 60 MG/0.6ML injection Inject 0.6 mLs (60 mg total) into the skin every 12 (twelve) hours. 06/13/17   Herminio Commons, MD  EPINEPHrine (EPIPEN) 0.3 mg/0.3 mL DEVI Inject 0.3 mg into the muscle as needed. For allergic reaction  [provider]  escitalopram (LEXAPRO) 10 MG tablet Take 10 mg by mouth daily.    [provider]  famotidine (PEPCID) 20 MG tablet Take 1 tablet (20 mg total) by mouth 2 (two) times daily for 3 days. 06/18/17 06/21/17  Volanda Napoleon, PA-C  Ferrous Gluconate (IRON 27 PO) Take 1 tablet by mouth daily.    [provider]  furosemide (LASIX) 80 MG tablet Take 1 tablet (80 mg total) 2 (two) times daily by mouth. 01/13/17 04/13/17  [name], MD  hydrocortisone cream 1 % Apply to affected area 2 times daily 06/18/17   [place] Lanius A, PA-C  losartan (COZAAR) 25 MG tablet TAKE 1 TABLET ONCE DAILY. 06/02/17   [name], MD  metolazone (ZAROXOLYN) 2.5 MG tablet TAKE 1 TABLET BY MOUTH ON MONDAY, [facility] AND FRIDAY. 06/02/17   [name], MD  montelukast (SINGULAIR) 10 MG tablet Take 10 mg by mouth at bedtime.     [provider]  Olopatadine HCl (PATADAY) 0.2 % SOLN Place 1 drop into both eyes as needed (FOR ALLERGIES).     [provider]  omeprazole (PRILOSEC) 20 MG capsule Take 20 mg by mouth 3 (three) times daily.     [provider]  ondansetron (ZOFRAN) 4 MG tablet Take 1 tablet (4 mg total) by mouth every 6 (six) hours. 11/02/16   Orpah Greek, MD  oxyCODONE (OXYCONTIN) 20 mg 12 hr tablet Take 20 mg by mouth every 12 (twelve) hours.    [provider]  oxyCODONE-acetaminophen (PERCOCET) 10-325 MG tablet Take 1 tablet by mouth every 4 (four) hours as needed for pain.    [provider]  potassium chloride SA (K-DUR,KLOR-CON) 20 MEQ tablet Take 4 tablets (80 mEq total) by mouth 2 (two) times daily. 06/14/17   Imogene Burn, PA-C  pravastatin (PRAVACHOL) 40 MG tablet TAKE 1 TABLET IN THE EVENING. 06/07/17   [name], MD  SUMAtriptan (IMITREX) 100 MG tablet Take 100 mg by mouth as needed for migraine (patient states the bottle doesnt have a time based frequency). For migraines    [provider]  TAZTIA XT 360 MG 24 hr capsule TAKE 1 CAPSULE BY MOUTH ONCE DAILY. 05/08/17   [name], MD  Tiotropium Bromide Monohydrate (SPIRIVA RESPIMAT) 1.25 MCG/ACT AERS Inhale 2 sprays into the lungs daily.     [provider]  warfarin (COUMADIN) 5 MG tablet Take 1 tablet daily except 1/2 tablet on Fridays 08/14/17   [name], MD    Family History Family History  Problem Relation Age of Onset  . Heart attack Father   . Cancer Mother        Type unknown  . Diabetes Unknown        family history  . Asthma Brother   . Hyperlipidemia Unknown        family history    Social History Social History   Tobacco Use  . Smoking status: Former Smoker    Packs/day: 0.10    Years: 30.00    Pack years: 3.00    Types: Cigarettes    Last attempt to quit: 04/29/2015    Years since quitting: 2.4  . Smokeless tobacco: Never Used  Substance Use Topics  . Alcohol use: No    Alcohol/week: 0.0 oz  . Drug use: No     Allergies   Beta adrenergic blockers; Doxycycline; Peanut-containing drug products; Shrimp [shellfish allergy]; Vancomycin; Avelox [moxifloxacin  hcl in nacl]; Ciprofloxacin; Cleocin [clindamycin hcl]; Moxifloxacin; Sulfamethoxazole-trimethoprim; and Tape   Review of Systems Review of Systems  Unable to perform ROS: Mental status change     Physical Exam Updated  Vital Signs BP 120/87 (BP Location: Right Arm)   Pulse 62   Temp 98.2 F (36.8 C) (Oral)   Resp (!) 7   Ht _0  (1.626 m)   Wt 68 kg (150 lb)   SpO2 (!) 88% Comment: 97% on 3 L of oxygen family states pt where's oxygen at home  BMI 25.75 kg/m   Physical Exam  Nursing note and vitals reviewed.  56 year old female, somnolent, but does wake up with painful stimuli. Vital signs are significant for slow respirations. Oxygen saturation is 88%, which is hypoxic. Head is normocephalic and atraumatic.  Pupils are 2-3 mm. Oropharynx is clear. Neck is nontender and supple without adenopathy or JVD. Back is nontender and there is no CVA tenderness. Lungs are clear without rales, wheezes, or rhonchi. Chest is nontender. Heart has regular rate and rhythm with prominent aortic click. Abdomen is soft, flat, nontender without masses or hepatosplenomegaly and peristalsis is normoactive. Extremities have 2+ edema.  Ecchymosis is noted over the toes of the right foot.  Dorsum of the right foot is mildly erythematous and warm. Skin is warm and dry without rash. Neurologic: Somnolent but arousable, cranial nerves are intact, there are no gross motor or sensory deficits.  ED Treatments / Results  Labs (all labs ordered are listed, but only abnormal results are displayed) Labs Reviewed  COMPREHENSIVE METABOLIC PANEL - Abnormal; Notable for the following components:      Result Value   Chloride 95 (*)    CO2 35 (*)    Glucose, Bld 109 (*)    Calcium 8.6 (*)    All other components within normal limits  PROTIME-INR - Abnormal; Notable for the following components:   Prothrombin Time 23.9 (*)    All other components within normal limits  RAPID URINE DRUG SCREEN, HOSP PERFORMED - Abnormal; Notable for the following components:   Benzodiazepines POSITIVE (*)    All other components within normal limits  BLOOD GAS, ARTERIAL - Abnormal; Notable for the following components:   pCO2 arterial 61.8 (*)      Bicarbonate 99.8 (*)    Acid-Base Excess 34.1 (*)    All other components within normal limits  CBG MONITORING, ED - Abnormal; Notable for the following components:   Glucose-Capillary 103 (*)    All other components within normal limits  CBC WITH DIFFERENTIAL/PLATELET  ETHANOL    EKG EKG Interpretation  Date/Time:  Friday September 29 2017 23:47:16 EDT Ventricular Rate:  61 PR Interval:    QRS Duration: 106 QT Interval:  430 QTC Calculation: 434 R Axis:   85 Text Interpretation:  Sinus rhythm Low voltage, precordial leads When compared with ECG of 06/19/2015, Sinus rhythm has replaced Atrial flutter Confirmed by Delora Fuel ([id number]) on 09/30/2017 12:12:20 AM   Radiology Dg Foot Complete Right  Result Date: 09/30/2017 CLINICAL DATA:  Bruising along the base of the toes. EXAM: RIGHT FOOT COMPLETE - 3+ VIEW COMPARISON:  None. FINDINGS: Lateral film shows cortical elevation in the region of the tarsometatarsal joint suggesting avulsion injury. Donor site for this fragment not evident on the provided 3 films. No other fracture or dislocation evident. There is evidence of soft tissue swelling in the midfoot. IMPRESSION: Cortical avulsion injury along the dorsal aspect of the midfoot although donor  site for the fracture is not evident. Electronically Signed   By: Misty Stanley M.D.   On: 09/30/2017 01:14    Procedures Procedures  CRITICAL CARE Performed by: Delora Fuel Total critical care time: 85 minutes Critical care time was exclusive of separately billable procedures and treating other patients. Critical care was necessary to treat or prevent imminent or life-threatening deterioration. Critical care was time spent personally by me on the following activities: development of treatment plan with patient and/or surrogate as well as nursing, discussions with consultants, evaluation of patient's response to treatment, examination of patient, obtaining history from patient or surrogate,  ordering and performing treatments and interventions, ordering and review of laboratory studies, ordering and review of radiographic studies, pulse oximetry and re-evaluation of patient's condition.  Medications Ordered in ED Medications  naloxone HCl (NARCAN) 4 mg in dextrose 5 % 250 mL infusion (0.5 mg/hr Intravenous Rate/Dose Change 09/30/17 0253)  sodium chloride 0.9 % bolus 1,000 mL (1,000 mLs Intravenous New Bag/Given 09/30/17 0252)  naloxone [facility]) injection 0.4 mg (0.4 mg Intravenous Given 09/30/17 0000)  naloxone [facility]) injection 0.4 mg (0.4 mg Intravenous Given 09/30/17 0059)  naloxone [facility]) injection 0.4 mg (0.4 mg Intravenous Given 09/30/17 0252)     Initial Impression / Assessment and Plan / ED Course  I have reviewed the triage vital signs and the nursing notes.  Pertinent labs & imaging results that were available during my care of the patient were reviewed by me and considered in my medical decision making (see chart for details).  Decreased level of consciousness with slow respirations and constricted pupils strongly suggestive of opioid overdose.  She is given a low dose of naloxone, and respirations improved significantly.  I have tried to avoid giving her a larger dose of naloxone to minimize any worsening of her chronic pain.  With new ecchymosis and swelling of the right foot, will check x-ray.  Records from her previous ED are not immediately available.  Foot x-rays do show probable avulsion fracture of a metatarsal bone.  Metabolic panel shows CO2 35.  She is on home oxygen, so ABG was obtained which showed elevated PCO2 of 61.8 but with normal pH.  Given normal pH, this is not felt to represent an acute respiratory acidosis.    Patient's respiratory status declined and she required additional naloxone.  She is placed on a naloxone drip and will need to be admitted.  Case is discussed with Dr. Olevia Bowens of [facility], who agrees to admit the patient.  Patient's  respiratory status declined again, so naloxone drip was increased.  She also developed some hypotension and was given IV fluids.  Final Clinical Impressions(s) / ED Diagnoses   Final diagnoses:  Opiate overdose, accidental or unintentional, initial encounter [facility])  Closed nondisplaced fracture of metatarsal bone of right foot, unspecified metatarsal, initial encounter  Carbon dioxide retention    ED Discharge Orders    None       Delora Fuel, MD 49/35/52 [id number]

## 2017-09-29 NOTE — ED Triage Notes (Signed)
Pts states she has been feeling lethargic for about a week now. According to family pt was taken to [facility] and treated for cellulitis and released last week. Pt is oriented X 4. Pt responds to verbal stimuli.

## 2017-09-30 ENCOUNTER — Inpatient Hospital Stay (HOSPITAL_COMMUNITY): Payer: Medicaid Other

## 2017-09-30 ENCOUNTER — Other Ambulatory Visit: Payer: Self-pay

## 2017-09-30 ENCOUNTER — Encounter (HOSPITAL_COMMUNITY): Payer: Self-pay | Admitting: Internal Medicine

## 2017-09-30 ENCOUNTER — Emergency Department (HOSPITAL_COMMUNITY): Payer: Medicaid Other

## 2017-09-30 DIAGNOSIS — I5032 Chronic diastolic (congestive) heart failure: Secondary | ICD-10-CM

## 2017-09-30 DIAGNOSIS — F418 Other specified anxiety disorders: Secondary | ICD-10-CM | POA: Diagnosis present

## 2017-09-30 DIAGNOSIS — T40604A Poisoning by unspecified narcotics, undetermined, initial encounter: Secondary | ICD-10-CM | POA: Diagnosis not present

## 2017-09-30 DIAGNOSIS — J449 Chronic obstructive pulmonary disease, unspecified: Secondary | ICD-10-CM | POA: Diagnosis present

## 2017-09-30 DIAGNOSIS — E782 Mixed hyperlipidemia: Secondary | ICD-10-CM

## 2017-09-30 DIAGNOSIS — Z91010 Allergy to peanuts: Secondary | ICD-10-CM | POA: Diagnosis not present

## 2017-09-30 DIAGNOSIS — E785 Hyperlipidemia, unspecified: Secondary | ICD-10-CM | POA: Diagnosis present

## 2017-09-30 DIAGNOSIS — S92301A Fracture of unspecified metatarsal bone(s), right foot, initial encounter for closed fracture: Secondary | ICD-10-CM

## 2017-09-30 DIAGNOSIS — T40601A Poisoning by unspecified narcotics, accidental (unintentional), initial encounter: Secondary | ICD-10-CM | POA: Diagnosis present

## 2017-09-30 DIAGNOSIS — G894 Chronic pain syndrome: Secondary | ICD-10-CM

## 2017-09-30 DIAGNOSIS — G9340 Encephalopathy, unspecified: Secondary | ICD-10-CM

## 2017-09-30 DIAGNOSIS — Z881 Allergy status to other antibiotic agents status: Secondary | ICD-10-CM | POA: Diagnosis not present

## 2017-09-30 DIAGNOSIS — Z952 Presence of prosthetic heart valve: Secondary | ICD-10-CM | POA: Diagnosis not present

## 2017-09-30 DIAGNOSIS — Z7901 Long term (current) use of anticoagulants: Secondary | ICD-10-CM | POA: Diagnosis not present

## 2017-09-30 DIAGNOSIS — E872 Acidosis: Secondary | ICD-10-CM | POA: Diagnosis present

## 2017-09-30 DIAGNOSIS — G92 Toxic encephalopathy: Secondary | ICD-10-CM | POA: Diagnosis present

## 2017-09-30 DIAGNOSIS — I2510 Atherosclerotic heart disease of native coronary artery without angina pectoris: Secondary | ICD-10-CM | POA: Diagnosis present

## 2017-09-30 DIAGNOSIS — K219 Gastro-esophageal reflux disease without esophagitis: Secondary | ICD-10-CM | POA: Diagnosis present

## 2017-09-30 DIAGNOSIS — Z79899 Other long term (current) drug therapy: Secondary | ICD-10-CM | POA: Diagnosis not present

## 2017-09-30 DIAGNOSIS — T40601D Poisoning by unspecified narcotics, accidental (unintentional), subsequent encounter: Secondary | ICD-10-CM

## 2017-09-30 DIAGNOSIS — W228XXA Striking against or struck by other objects, initial encounter: Secondary | ICD-10-CM | POA: Diagnosis present

## 2017-09-30 DIAGNOSIS — Z87891 Personal history of nicotine dependence: Secondary | ICD-10-CM | POA: Diagnosis not present

## 2017-09-30 DIAGNOSIS — M549 Dorsalgia, unspecified: Secondary | ICD-10-CM | POA: Diagnosis present

## 2017-09-30 DIAGNOSIS — I110 Hypertensive heart disease with heart failure: Secondary | ICD-10-CM | POA: Diagnosis present

## 2017-09-30 DIAGNOSIS — S99921A Unspecified injury of right foot, initial encounter: Secondary | ICD-10-CM | POA: Diagnosis present

## 2017-09-30 DIAGNOSIS — E876 Hypokalemia: Secondary | ICD-10-CM | POA: Diagnosis present

## 2017-09-30 DIAGNOSIS — Z91013 Allergy to seafood: Secondary | ICD-10-CM | POA: Diagnosis not present

## 2017-09-30 DIAGNOSIS — H9190 Unspecified hearing loss, unspecified ear: Secondary | ICD-10-CM | POA: Diagnosis present

## 2017-09-30 DIAGNOSIS — T402X1A Poisoning by other opioids, accidental (unintentional), initial encounter: Secondary | ICD-10-CM | POA: Diagnosis present

## 2017-09-30 DIAGNOSIS — M1990 Unspecified osteoarthritis, unspecified site: Secondary | ICD-10-CM | POA: Diagnosis present

## 2017-09-30 LAB — COMPREHENSIVE METABOLIC PANEL
ALBUMIN: 3.5 g/dL (ref 3.5–5.0)
ALBUMIN: 3.9 g/dL (ref 3.5–5.0)
ALK PHOS: 54 U/L (ref 38–126)
ALT: 24 U/L (ref 0–44)
ALT: 28 U/L (ref 0–44)
ANION GAP: 6 (ref 5–15)
AST: 28 U/L (ref 15–41)
AST: 32 U/L (ref 15–41)
Alkaline Phosphatase: 56 U/L (ref 38–126)
Anion gap: 8 (ref 5–15)
BILIRUBIN TOTAL: 0.7 mg/dL (ref 0.3–1.2)
BUN: 11 mg/dL (ref 6–20)
BUN: 18 mg/dL (ref 6–20)
CHLORIDE: 92 mmol/L — AB (ref 98–111)
CHLORIDE: 95 mmol/L — AB (ref 98–111)
CO2: 35 mmol/L — AB (ref 22–32)
CO2: 41 mmol/L — ABNORMAL HIGH (ref 22–32)
Calcium: 8.5 mg/dL — ABNORMAL LOW (ref 8.9–10.3)
Calcium: 8.6 mg/dL — ABNORMAL LOW (ref 8.9–10.3)
Creatinine, Ser: 0.61 mg/dL (ref 0.44–1.00)
Creatinine, Ser: 0.82 mg/dL (ref 0.44–1.00)
GFR calc Af Amer: >60 mL/min (ref >60)
GFR calc Af Amer: >60 mL/min (ref >60)
GFR calc non Af Amer: >60 mL/min (ref >60)
GFR calc non Af Amer: >60 mL/min (ref >60)
GLUCOSE: 109 mg/dL — AB (ref 70–99)
Glucose, Bld: 95 mg/dL (ref 70–99)
POTASSIUM: 3.2 mmol/L — AB (ref 3.5–5.1)
POTASSIUM: 4.3 mmol/L (ref 3.5–5.1)
SODIUM: 139 mmol/L (ref 135–145)
Sodium: 138 mmol/L (ref 135–145)
Total Bilirubin: 0.7 mg/dL (ref 0.3–1.2)
Total Protein: 6 g/dL — ABNORMAL LOW (ref 6.5–8.1)
Total Protein: 6.6 g/dL (ref 6.5–8.1)

## 2017-09-30 LAB — PROTIME-INR
INR: 2.16
INR: 2.64
PROTHROMBIN TIME: 27.9 s — AB (ref 11.4–15.2)
Prothrombin Time: 23.9 seconds — ABNORMAL HIGH (ref 11.4–15.2)

## 2017-09-30 LAB — BLOOD GAS, ARTERIAL
Acid-Base Excess: 34.1 mmol/L — ABNORMAL HIGH (ref 0.0–2.0)
Bicarbonate: 99.8 mmol/L — ABNORMAL HIGH (ref 20.0–28.0)
Drawn by: 22223
O2 CONTENT: 3 L/min
O2 Saturation: 97.4 %
PO2 ART: 99.8 mmHg (ref 83.0–108.0)
pCO2 arterial: 61.8 mmHg — ABNORMAL HIGH (ref 32.0–48.0)
pH, Arterial: 7.393 (ref 7.350–7.450)

## 2017-09-30 LAB — CBC WITH DIFFERENTIAL/PLATELET
BASOS ABS: 0 10*3/uL (ref 0.0–0.1)
BASOS PCT: 0 %
Basophils Absolute: 0 10*3/uL (ref 0.0–0.1)
Basophils Relative: 0 %
EOS PCT: 3 %
Eosinophils Absolute: 0.2 10*3/uL (ref 0.0–0.7)
Eosinophils Absolute: 0.4 10*3/uL (ref 0.0–0.7)
Eosinophils Relative: 4 %
HEMATOCRIT: 37.4 % (ref 36.0–46.0)
HEMATOCRIT: 38.7 % (ref 36.0–46.0)
HEMOGLOBIN: 12.2 g/dL (ref 12.0–15.0)
HEMOGLOBIN: 12.4 g/dL (ref 12.0–15.0)
LYMPHS ABS: 1.4 10*3/uL (ref 0.7–4.0)
LYMPHS ABS: 2.5 10*3/uL (ref 0.7–4.0)
LYMPHS PCT: 23 %
LYMPHS PCT: 27 %
MCH: 30.5 pg (ref 26.0–34.0)
MCH: 31.1 pg (ref 26.0–34.0)
MCHC: 32 g/dL (ref 30.0–36.0)
MCHC: 32.6 g/dL (ref 30.0–36.0)
MCV: 95.3 fL (ref 78.0–100.0)
MCV: 95.4 fL (ref 78.0–100.0)
MONO ABS: 0.8 10*3/uL (ref 0.1–1.0)
MONOS PCT: 8 %
Monocytes Absolute: 0.4 10*3/uL (ref 0.1–1.0)
Monocytes Relative: 7 %
NEUTROS ABS: 3.9 10*3/uL (ref 1.7–7.7)
NEUTROS ABS: 5.8 10*3/uL (ref 1.7–7.7)
NEUTROS PCT: 67 %
Neutrophils Relative %: 61 %
Platelets: 200 10*3/uL (ref 150–400)
Platelets: 259 10*3/uL (ref 150–400)
RBC: 3.92 MIL/uL (ref 3.87–5.11)
RBC: 4.06 MIL/uL (ref 3.87–5.11)
RDW: 13.5 % (ref 11.5–15.5)
RDW: 13.6 % (ref 11.5–15.5)
WBC: 5.8 10*3/uL (ref 4.0–10.5)
WBC: 9.5 10*3/uL (ref 4.0–10.5)

## 2017-09-30 LAB — RAPID URINE DRUG SCREEN, HOSP PERFORMED
AMPHETAMINES: NOT DETECTED
BENZODIAZEPINES: POSITIVE — AB
Barbiturates: NOT DETECTED
COCAINE: NOT DETECTED
OPIATES: NOT DETECTED
Tetrahydrocannabinol: NOT DETECTED

## 2017-09-30 LAB — MAGNESIUM: Magnesium: 1.6 mg/dL — ABNORMAL LOW (ref 1.7–2.4)

## 2017-09-30 LAB — TSH: TSH: 0.367 u[IU]/mL (ref 0.350–4.500)

## 2017-09-30 LAB — CBG MONITORING, ED: Glucose-Capillary: 103 mg/dL — ABNORMAL HIGH (ref 70–99)

## 2017-09-30 LAB — MRSA PCR SCREENING: MRSA BY PCR: NEGATIVE

## 2017-09-30 LAB — ACETAMINOPHEN LEVEL

## 2017-09-30 LAB — ETHANOL: Alcohol, Ethyl (B): <10 mg/dL (ref <10)

## 2017-09-30 LAB — AMMONIA: AMMONIA: 11 umol/L (ref 9–35)

## 2017-09-30 MED ORDER — PRAVASTATIN SODIUM 40 MG PO TABS: 40.0000 mg | ORAL_TABLET | Freq: Every day | ORAL | Status: DC

## 2017-09-30 MED ORDER — ACETAMINOPHEN 650 MG RE SUPP: 650.0000 mg | Freq: Four times a day (QID) | RECTAL | Status: DC | PRN

## 2017-09-30 MED ORDER — NALOXONE HCL 4 MG/10ML IJ SOLN
0.5000 mg/h | INTRAVENOUS | Status: DC
  Administered 2017-09-30: 0.25 mg/h via INTRAVENOUS
  Filled 2017-09-30: qty 10

## 2017-09-30 MED ORDER — ONDANSETRON HCL 4 MG/2ML IJ SOLN
4.0000 mg | Freq: Four times a day (QID) | INTRAMUSCULAR | Status: DC | PRN
Start: 2017-09-30 — End: 2017-09-30

## 2017-09-30 MED ORDER — TIOTROPIUM BROMIDE MONOHYDRATE 18 MCG IN CAPS
18.0000 ug | ORAL_CAPSULE | Freq: Every day | RESPIRATORY_TRACT | Status: DC
  Filled 2017-09-30: qty 5

## 2017-09-30 MED ORDER — WARFARIN - PHARMACIST DOSING INPATIENT: Freq: Every day | Status: DC

## 2017-09-30 MED ORDER — ORAL CARE MOUTH RINSE: 15.0000 mL | Freq: Two times a day (BID) | OROMUCOSAL | Status: DC

## 2017-09-30 MED ORDER — CITALOPRAM HYDROBROMIDE 20 MG PO TABS
20.0000 mg | ORAL_TABLET | Freq: Every day | ORAL | Status: DC
  Administered 2017-09-30: 20 mg via ORAL
  Filled 2017-09-30: qty 1

## 2017-09-30 MED ORDER — MAGNESIUM OXIDE 400 (241.3 MG) MG PO TABS: 400.0000 mg | ORAL_TABLET | Freq: Two times a day (BID) | ORAL | Status: DC

## 2017-09-30 MED ORDER — OXYCODONE-ACETAMINOPHEN 5-325 MG PO TABS
1.0000 | ORAL_TABLET | Freq: Four times a day (QID) | ORAL | Status: DC | PRN
  Administered 2017-09-30: 1 via ORAL
  Filled 2017-09-30: qty 1

## 2017-09-30 MED ORDER — NALOXONE HCL 0.4 MG/ML IJ SOLN
0.4000 mg | Freq: Once | INTRAMUSCULAR | Status: AC
  Administered 2017-09-30: 0.4 mg via INTRAVENOUS
  Filled 2017-09-30: qty 1

## 2017-09-30 MED ORDER — FUROSEMIDE 80 MG PO TABS
80.0000 mg | ORAL_TABLET | Freq: Two times a day (BID) | ORAL | Status: DC
  Filled 2017-09-30: qty 1

## 2017-09-30 MED ORDER — MAGNESIUM OXIDE 400 (241.3 MG) MG PO TABS: 400.0000 mg | ORAL_TABLET | Freq: Two times a day (BID) | ORAL | 0 refills | Status: DC

## 2017-09-30 MED ORDER — NALOXONE HCL 2 MG/2ML IJ SOSY
PREFILLED_SYRINGE | INTRAMUSCULAR | Status: AC
  Filled 2017-09-30: qty 4

## 2017-09-30 MED ORDER — POTASSIUM CHLORIDE CRYS ER 20 MEQ PO TBCR: 40.0000 meq | EXTENDED_RELEASE_TABLET | Freq: Once | ORAL | Status: DC

## 2017-09-30 MED ORDER — SODIUM CHLORIDE 0.9 % IV SOLN
INTRAVENOUS | Status: DC
  Administered 2017-09-30: 05:00:00 via INTRAVENOUS

## 2017-09-30 MED ORDER — WARFARIN SODIUM 5 MG PO TABS: 5.0000 mg | ORAL_TABLET | Freq: Once | ORAL | Status: DC

## 2017-09-30 MED ORDER — ACETAMINOPHEN 325 MG PO TABS: 650.0000 mg | ORAL_TABLET | Freq: Four times a day (QID) | ORAL | Status: DC | PRN

## 2017-09-30 MED ORDER — MOMETASONE FURO-FORMOTEROL FUM 200-5 MCG/ACT IN AERO
2.0000 | INHALATION_SPRAY | Freq: Two times a day (BID) | RESPIRATORY_TRACT | Status: DC
  Filled 2017-09-30: qty 8.8

## 2017-09-30 MED ORDER — OXYCODONE HCL 5 MG PO TABS
5.0000 mg | ORAL_TABLET | Freq: Four times a day (QID) | ORAL | Status: DC | PRN
  Filled 2017-09-30: qty 1

## 2017-09-30 MED ORDER — ALPRAZOLAM 0.5 MG PO TABS: 1.0000 mg | ORAL_TABLET | Freq: Three times a day (TID) | ORAL | Status: DC | PRN

## 2017-09-30 MED ORDER — ONDANSETRON HCL 4 MG PO TABS: 4.0000 mg | ORAL_TABLET | Freq: Four times a day (QID) | ORAL | Status: DC | PRN

## 2017-09-30 MED ORDER — SODIUM CHLORIDE 0.9 % IV BOLUS
1000.0000 mL | Freq: Once | INTRAVENOUS | Status: AC
  Administered 2017-09-30: 1000 mL via INTRAVENOUS

## 2017-09-30 NOTE — H&P (Signed)
History and Physical    Lindsay Bonilla VVO:[id number] DOB: [birth year] DOA: 09/29/2017  PCP: [name], MD   Patient coming from: Home.  I have personally briefly reviewed patient's old medical records in [place]  Chief Complaint: Opiate overdose.  HPI: Lindsay Bonilla is a 57 y.o. female with medical history significant of anxiety, aortic aneurysm, aortic insufficiency and severe stenosis, history of aortic valve replacement and aortoplasty, osteoarthritis, asthma, COPD, diastolic heart failure, depression, GERD, migraine headaches, pulmonary nodule, stress incontinence who was brought to the emergency department via EMS after the patient has been lethargic for the past 5 days.  Apparently she takes alprazolam 1 mg 4 times a day as needed, OxyContin 20 mg p.o. twice daily and Percocet 10/325 mg p.o. every 4 hours as needed.  Yesterday evening, the patient took an unknown amount of a slow and immediate release oxycodone.  She is currently unable to provide further information and her daughter is not aware of any more details.  However, her daughter will bring her medication bottles later today for [place] to check and update her medications.  ED Course: Initial vital signs temperature 98.2 F, pulse 62, respirations 7, blood pressure 120/87 and O2 sat 88% on room air.  She was given Narcan 0.4 mg IVP x3, but was subsequently started on the naloxone infusion.  She has been requiring continuous naloxone infusion since then.  Urine toxicology was only positive for benzodiazepines?  Her CBC was normal.  PT 23.9 seconds and INR 2.16.  Her sodium 138, potassium 4.3, chloride 95 and CO2 35 mmol/L.  Glucose was 109, BUN 18, creatinine 0.82 and calcium 8.6 mg/dL.  Her LFTs are within normal limits.  Right foot x-ray showed cortical lobules from injury along the dorsal aspect of the midfoot although donor site for the fracture is not evident.  Please see images for further detail.  Review of Systems:  Unable to obtain.  Past Medical History:  Diagnosis Date  . Anxiety   . Aortic aneurysm ([place])   . Aortic insufficiency    a. s/p Bent[place] with mechanical AVR 4/17 >> FU Echo 6/17: Mild LVH, EF 60-65%, normal wall motion, ventricular septum with paradoxical septal motion, [employer]. Jude mechanical AVR functioning  normally with no regurgitation (mean 6 mmHg), MAC, trivial MR, mild RVE, trivial TR  . Aortic stenosis, severe    Bicuspid valve  . Arthritis   . Asthma   . Bronchitis   . CHF (congestive heart failure) ([place])   . COPD (chronic obstructive pulmonary disease) ([place])   . DDD (degenerative disc disease), cervical   . Depression   . Dysrhythmia    palpitations  . GERD (gastroesophageal reflux disease)   . Hard of hearing   . History of pneumonia   . 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 Migraine headache   . Palpitations   . PONV (postoperative nausea and vomiting)    "only once"  . Shortness of breath    Nodule left lung CT done 8/6  . Stress incontinence   . Urinary frequency    due to Lasix  . Valvular heart disease     Past Surgical History:  Procedure Laterality Date  . ABDOMINAL HYSTERECTOMY    . ANTERIOR CERVICAL DECOMP/DISCECTOMY FUSION  10/05/2011   Procedure: ANTERIOR CERVICAL DECOMPRESSION/DISCECTOMY FUSION 2 LEVELS;  Surgeon: Floyce Stakes, MD;  Location: [facility];  Service: Neurosurgery;  Laterality: N/A;  Cervical five - six , six -  seven Anterior cervical decompression/diskectomy/fusion/plate  . ANTERIOR CRUCIATE LIGAMENT REPAIR Right   . AORTIC VALVE REPLACEMENT    . ARTHROSCOPIC REPAIR ACL  rt  . BENTALL PROCEDURE N/A 06/15/2015   Procedure: BENTALL PROCEDURE; HEMI-ARCH REPAIR WITH #23 ST JUDE MECHANICAL AVR CONDUIT AND #28 HEMASHIELD PLATINUM GRAFT;  Surgeon: Gaye Pollack, MD;  Location: [facility];  Service: Open Heart Surgery;  Laterality: N/A;  . CARDIAC CATHETERIZATION N/A 05/21/2015   Procedure: Right/Left Heart Cath and  Coronary Angiography;  Surgeon: [name], MD;  Location: [facility];  Service: Cardiovascular;  Laterality: N/A;  . CHOLECYSTECTOMY    . COLONOSCOPY  10/13/05  . DIAGNOSTIC LAPAROSCOPY    . JOINT REPLACEMENT Right 2016  . KNEE ARTHROSCOPY     rt x4  x1 lft  . MYRINGOPLASTY W/ FAT GRAFT     graft from behind ear  . SIGMOIDOSCOPY  10/13/05, 09/19/05  . TEE WITHOUT CARDIOVERSION N/A 06/15/2015   Procedure: TRANSESOPHAGEAL ECHOCARDIOGRAM (TEE);  Surgeon: Gaye Pollack, MD;  Location: [facility];  Service: Open Heart Surgery;  Laterality: N/A;  . TONSILLECTOMY  2005  . TUBAL LIGATION    . TYMPANOSTOMY TUBE PLACEMENT       reports that she quit smoking about 2 years ago. Her smoking use included cigarettes. She has a 3.00 pack-year smoking history. She has never used smokeless tobacco. She reports that she does not drink alcohol or use drugs.  Allergies  Allergen Reactions  . Beta Adrenergic Blockers Anaphylaxis  . Doxycycline Other (See Comments)    "Swelling, dizziness, sleepy, talking out of my head, almost caused liver failure"  . Peanut-Containing Drug Products Anaphylaxis, Shortness Of Breath, Swelling and Other (See Comments)    Swelling of the throat  . Shrimp [Shellfish Allergy] Anaphylaxis  . Vancomycin Swelling    Rash and severe swelling  . Avelox [Moxifloxacin Hcl In Nacl] Rash  . Ciprofloxacin Nausea And Vomiting  . Cleocin [Clindamycin Hcl] Rash  . Moxifloxacin Rash  . Sulfamethoxazole-Trimethoprim Rash  . Tape Other (See Comments)    White clear itches, rash    Family History  Problem Relation Age of Onset  . Heart attack Father   . Cancer Mother        Type unknown  . Diabetes Unknown        family history  . Asthma Brother   . Hyperlipidemia Unknown        family history    Prior to Admission medications   Medication Sig Start Date End Date Taking? Authorizing Provider  albuterol (PROVENTIL) (2.5 MG/3ML) 0.083% nebulizer solution Take 2.5  mg by nebulization every 6 (six) hours as needed for wheezing or shortness of breath.    [provider]  albuterol (VENTOLIN HFA) 108 (90 BASE) MCG/ACT inhaler Inhale 1 puff into the lungs 4 (four) times daily as needed. For shortness of breath    [provider]  ALPRAZolam (XANAX) 1 MG tablet Take 1 mg by mouth 4 (four) times daily as needed. For anxiety    [provider]  amoxicillin (AMOXIL) 500 MG capsule TAKE 4 CAPSULES 30 - 60 MINUTES PRIOR TO DENTAL APPOINTMENTS. 06/21/17   [name], MD  amoxicillin-clavulanate (AUGMENTIN) 875-125 MG tablet Take 1 tablet by mouth 2 (two) times daily. 11/02/16   Orpah Greek, MD  aspirin EC 81 MG tablet Take 1 tablet (81 mg total) by mouth daily. 07/23/15   Richardson Dopp T, PA-C  budesonide-formoterol (SYMBICORT) 160-4.5 MCG/ACT  inhaler Inhale 2 puffs into the lungs 2 (two) times daily.    [provider]  cetirizine (ZYRTEC) 10 MG tablet Take 10 mg by mouth every morning.     [provider]  diphenhydrAMINE (BENADRYL) 25 MG tablet Take 1 tablet (25 mg total) by mouth every 6 (six) hours for 3 days. 06/18/17 06/21/17  Volanda Napoleon, PA-C  enoxaparin (LOVENOX) 60 MG/0.6ML injection Inject 0.6 mLs (60 mg total) into the skin every 12 (twelve) hours. 06/13/17   Herminio Commons, MD  EPINEPHrine (EPIPEN) 0.3 mg/0.3 mL DEVI Inject 0.3 mg into the muscle as needed. For allergic reaction    [provider]  escitalopram (LEXAPRO) 10 MG tablet Take 10 mg by mouth daily.    [provider]  famotidine (PEPCID) 20 MG tablet Take 1 tablet (20 mg total) by mouth 2 (two) times daily for 3 days. 06/18/17 06/21/17  Volanda Napoleon, PA-C  Ferrous Gluconate (IRON 27 PO) Take 1 tablet by mouth daily.    [provider]  furosemide (LASIX) 80 MG tablet Take 1 tablet (80 mg total) 2 (two) times daily by mouth. 01/13/17 04/13/17  [name], MD  hydrocortisone cream 1 %  Apply to affected area 2 times daily 06/18/17   Providence Lanius A, PA-C  losartan (COZAAR) 25 MG tablet TAKE 1 TABLET ONCE DAILY. 06/02/17   [name], MD  metolazone (ZAROXOLYN) 2.5 MG tablet TAKE 1 TABLET BY MOUTH ON MONDAY, [facility] AND FRIDAY. 06/02/17   [name], MD  montelukast (SINGULAIR) 10 MG tablet Take 10 mg by mouth at bedtime.     [provider]  Olopatadine HCl (PATADAY) 0.2 % SOLN Place 1 drop into both eyes as needed (FOR ALLERGIES).     [provider]  omeprazole (PRILOSEC) 20 MG capsule Take 20 mg by mouth 3 (three) times daily.     [provider]  ondansetron (ZOFRAN) 4 MG tablet Take 1 tablet (4 mg total) by mouth every 6 (six) hours. 11/02/16   Orpah Greek, MD  oxyCODONE (OXYCONTIN) 20 mg 12 hr tablet Take 20 mg by mouth every 12 (twelve) hours.    [provider]  oxyCODONE-acetaminophen (PERCOCET) 10-325 MG tablet Take 1 tablet by mouth every 4 (four) hours as needed for pain.    [provider]  potassium chloride SA (K-DUR,KLOR-CON) 20 MEQ tablet Take 4 tablets (80 mEq total) by mouth 2 (two) times daily. 06/14/17   Imogene Burn, PA-C  pravastatin (PRAVACHOL) 40 MG tablet TAKE 1 TABLET IN THE EVENING. 06/07/17   [name], MD  SUMAtriptan (IMITREX) 100 MG tablet Take 100 mg by mouth as needed for migraine (patient states the bottle doesnt have a time based frequency). For migraines    [provider]  TAZTIA XT 360 MG 24 hr capsule TAKE 1 CAPSULE BY MOUTH ONCE DAILY. 05/08/17   [name], MD  Tiotropium Bromide Monohydrate (SPIRIVA RESPIMAT) 1.25 MCG/ACT AERS Inhale 2 sprays into the lungs daily.     [provider]  warfarin (COUMADIN) 5 MG tablet Take 1 tablet daily except 1/2 tablet on Fridays 08/14/17   [name], MD    Physical Exam: Vitals:   09/30/17 0300 09/30/17 0315 09/30/17 0330 09/30/17 0416  BP: 119/71 117/65 105/65  98/68  Pulse: 60 (!) 57 (!) 56 (!) 54  Resp: _0 Temp:    97.9 F (36.6 C)  TempSrc:  Axillary  SpO2: 98% 100% 100% 96%  Weight:      Height:        Constitutional: Somnolent, but in NAD. Eyes: PERRL, lids and conjunctivae normal ENMT: Mucous membranes are mildly dry. Posterior pharynx clear of any exudate or lesions. Neck: normal, supple, no masses, no thyromegaly Respiratory: Decreased breath sounds on bases, but otherwise clear to auscultation bilaterally, no wheezing, no crackles. Normal respiratory effort. No accessory muscle use.  Cardiovascular: Bradycardic at 58 bpm, no murmurs / rubs / gallops. No extremity edema. 2+ pedal pulses. No carotid bruits.  Abdomen: Soft, no tenderness, no masses palpated. No hepatosplenomegaly. Bowel sounds positive.  Musculoskeletal: no clubbing / cyanosis.  Right foot dorsal swelling and edema.  There is ecchymosis on all the right toes.  Please see image below. Good ROM, no contractures. Normal muscle tone.  Skin: Erythema of right foot with dorsal aspect.  Ecchymosis of the right toes. Neurologic: Somnolent.  Seems nonfocal.  However unable to fully evaluate. Psychiatric: Somnolent.  Wakes up briefly, but continues to be confused.     Labs on Admission: I have personally reviewed following labs and imaging studies  CBC: Recent Labs  Lab 09/30/17 0001  WBC 9.5  NEUTROABS 5.8  HGB 12.4  HCT 38.7  MCV 95.3  PLT 564   Basic Metabolic Panel: Recent Labs  Lab 09/30/17 0001  NA 138  K 4.3  CL 95*  CO2 35*  GLUCOSE 109*  BUN 18  CREATININE 0.82  CALCIUM 8.6*   GFR: Estimated Creatinine Clearance: 72.6 mL/min (by C-G formula based on SCr of 0.82 mg/dL). Liver Function Tests: Recent Labs  Lab 09/30/17 0001  AST 32  ALT 28  ALKPHOS 56  BILITOT 0.7  PROT 6.6  ALBUMIN 3.9   No results for input(s): LIPASE, AMYLASE in the last 168 hours. No results for input(s): AMMONIA in the last 168 hours. Coagulation  Profile: Recent Labs  Lab 09/30/17 0001  INR 2.16   Cardiac Enzymes: No results for input(s): CKTOTAL, CKMB, CKMBINDEX, TROPONINI in the last 168 hours. BNP (last 3 results) No results for input(s): PROBNP in the last 8760 hours. HbA1C: No results for input(s): HGBA1C in the last 72 hours. CBG: Recent Labs  Lab 09/30/17 0003  GLUCAP 103*   Lipid Profile: No results for input(s): CHOL, HDL, LDLCALC, TRIG, CHOLHDL, LDLDIRECT in the last 72 hours. Thyroid Function Tests: No results for input(s): TSH, T4TOTAL, FREET4, T3FREE, THYROIDAB in the last 72 hours. Anemia Panel: No results for input(s): VITAMINB12, FOLATE, FERRITIN, TIBC, IRON, RETICCTPCT in the last 72 hours. Urine analysis:    Component Value Date/Time   COLORURINE YELLOW 11/02/2016 0547   APPEARANCEUR CLEAR 11/02/2016 0547   LABSPEC >1.046 (H) 11/02/2016 0547   PHURINE 6.0 11/02/2016 [place] 11/02/2016 0547   HGBUR NEGATIVE 11/02/2016 0547   BILIRUBINUR NEGATIVE 11/02/2016 0547   KETONESUR NEGATIVE 11/02/2016 0547   PROTEINUR NEGATIVE 11/02/2016 0547   NITRITE NEGATIVE 11/02/2016 0547   LEUKOCYTESUR NEGATIVE 11/02/2016 0547    Radiological Exams on Admission: Dg Foot Complete Right  Result Date: 09/30/2017 CLINICAL DATA:  Bruising along the base of the toes. EXAM: RIGHT FOOT COMPLETE - 3+ VIEW COMPARISON:  None. FINDINGS: Lateral film shows cortical elevation in the region of the tarsometatarsal joint suggesting avulsion injury. Donor site for this fragment not evident on the provided 3 films. No other fracture or dislocation evident. There is evidence of soft tissue swelling in the midfoot. IMPRESSION: Cortical avulsion  injury along the dorsal aspect of the midfoot although donor site for the fracture is not evident. Electronically Signed   By: Misty Stanley M.D.   On: 09/30/2017 01:14    EKG: Independently reviewed. Vent. rate 61 BPM PR interval * ms QRS duration 106 ms QT/QTc 430/434  ms P-R-T axes 71 85 80 Sinus rhythm Low voltage precordial leads. Patient is in sinus rhythm now, previously on a floater.  Assessment/Plan Principal Problem:   Opiate overdose ([place])  Admit to stepdown/inpatient. N.p.o. for now. Continue supplemental oxygen. Continue naloxone infusion. Check acetaminophen level. Neurochecks every 4 hours. The patient's daughter will bring her medications bottles later today.  Active Problems:   COPD (chronic obstructive pulmonary disease) ([facility]) Supplemental oxygen as needed. Bronchodilators as needed.    CAD (coronary artery disease), native coronary artery On aspirin, warfarin and statin.    S/P aortic valve replacement The new warfarin per pharmacy.    Hyperlipidemia Resume pravastatin once dose is confirmed.    Chronic diastolic CHF (congestive heart failure) ([facility]) No signs of decompensation at this time. However, the patient takes furosemide on a regular basis.    Depression with anxiety Hold alprazolam for now. Confusion dose of Lexapro before restarting.    Right foot injury No fracture seen. Analgesics to be used sporadically, given current clinical picture and chronic anticoagulation.   DVT prophylaxis: On warfarin. Code Status: Full code. Family Communication: Spoke to her daughter Georga Hacking. Disposition Plan: Admit to stepdown to continue Narcan infusion. Consults called:  Admission status: Inpatient/stepdown.   Reubin Milan MD [facility] Pager [phone].  If 7PM-7AM, please contact night-coverage [email or web address] Password [email or web address]  09/30/2017, 5:20 AM

## 2017-09-30 NOTE — Discharge Summary (Addendum)
Physician Discharge Summary  Lindsay Bonilla VOJ:[id number] DOB: 1960/05/16 DOA: 09/29/2017  PCP: [name], MD  Admit date: 09/29/2017 Discharge date: 09/30/2017  Admitted From: Home Disposition:  Home   Recommendations for Outpatient Follow-up:  1. Follow up with PCP in 1-2 weeks 2. Please obtain BMP/CBC in one week   Discharge Condition: Stable CODE STATUS:FULL Diet recommendation: Heart Healthy   Brief/Interim Summary:  57 year old female with a history of aortic stenosis and thoracic aortic aneurysm status post Bentall procedure with mechanical AVR 05/2015,, chronic back pain, diastolic CHF, COPD, anxiety/depression, hypertension presenting with 1 week history of lethargy that worsened to the point of difficulty to arouse on the evening of 09/29/2017.  Apparently, the patient visited the emergency department at [facility] approximately 1 week prior to this admission.  She was discharged home with Augmentin for treatment of cellulitis of her right lower extremity.  Patient states that a little over a week ago she dropped a can onto her right foot.  Despite antibiotics, the patient continued to have edema and pain of her lower extremities.  As result, she stated that that was the primary reason for her coming to the emergency department on 09/29/17.  However, the patient's daughter noted the patient to have increasing lethargy and difficulty to arouse and hypoventilation as they were driving the patient to the emergency department.  She states that the patient recently started taking some leftover baclofen that she had from November 2017 this past week because of the pain.  There is been no reports of chest pain, worsening shortness of breath, nausea, vomiting, diarrhea, abdominal pain, headache, neck pain.  There is been no fevers or chills or dysuria.  In the emergency department, the patient required 3 doses of Narcan secondary to her lethargy.  She was subsequently placed on a Narcan drip.  On the  morning of 09/30/2017, the patient's mental status returned to baseline. The patient's nalaxone drip was discontinued and she was monitored clinically with restart of her immediate release opioids.  The patient's mental status remained lucid.  Of note, the pt exhibited belligerent behavior toward the entire nursing and pharmacy staff and myself.  She simultaneously accused the entire staff taking care of her of trying to overdose and kill her with giving her too much opioid, but at the same time demanded to be given long-acting OxyContin 20 mg.  I reassured the patient that she was given the same dose of Percocet 10/325 which she was taking at home, but the patient continued to insist that she was being given 15 mg of oxycodone with acetaminophen 325 mg.  It was verified with pharmacy staff that the patient was given the appropriate dose of Percocet totaling 10/325 mg.  I also explained to the patient that I was not restarting her long-acting OxyContin at this time given her clinical presentation of acute toxic encephalopathy from her opioids.  The patient did not have any concerning dysrhythmias on telemetry.  Oxygen saturation remained 97-100% on room air.  She remained hemodynamically stable.  Her mental status remained lucid.  TSH, ammonia were unremarkable.  The patient demanded to be discharged home.  Since the patient remained clinically stable, she was discharged home to follow-up with Dr. [name].  I strongly encouraged the patient to discuss decreasing her opioid regimen with her primary care provider after discharge.  I have told the patient to hold off taking oxyContin 20 mg until she follows up with Dr. Luan Pulling to discuss her regimen.   Discharge  Diagnoses:  Acute toxic encephalopathy -Secondary to baclofen use in the setting of chronic opioid use -The patient denies overusing her Percocet and OxyContin -She did not have her Percocet bottle, but I counted her OxyContin pills, and she was  short 3-4 pills base upon the date of her last refill -[facility] has been queried for this patient--no red flags -mental status has improved -stop nalaxone drip and monitor -restart home dose percocet and monitor -d/c baclofen as brought in his abdominal wall there -TSH--2.64 -ammonia--11 -B12--pending  Chronic pain syndrome -receives all opioids and alprazolam from Dr. Luan Pulling -d/c baclofen  Aortic Stenosis -s/p mechanical AVR 05/2015 -continue coumadin -INR 2.17 at time of admission-->2.67 at time of d/c  Essential hypertension -Holding diltiazem and losartan secondary to soft blood pressure-->restart as bp has improved  Chronic diastolic CHF -Appears clinically euvolemic -Daily weights -Restart home dose furosemide -07/30/2015 Echo EF 60-65%, no WMA  Depression/anxiety -Restart Lexapro and alprazolam  COPD -Restart Spiriva and Singulair and Symbicort  Hyperlipidemia -Restart statin  Lower extremity pain and edema -Venous duplex--neg for DVT  Right foot avulsion fracture -Place patient in cam boot for comfort -outpt ortho follow up -09/30/17 right foot xray--cortical avulsion injury in the region of the tarsometatarsal joint  Hypokalemia -replete -check mag-1.6  Hypomagnesemia -replete      Discharge Instructions   Allergies as of 09/30/2017      Reactions   Beta Adrenergic Blockers Anaphylaxis   Doxycycline Other (See Comments)   "Swelling, dizziness, sleepy, talking out of my head, almost caused liver failure"   Peanut-containing Drug Products Anaphylaxis, Shortness Of Breath, Swelling, Other (See Comments)   Swelling of the throat   Shrimp [shellfish Allergy] Anaphylaxis   Vancomycin Swelling   Rash and severe swelling   Other    Red Meat and Dairy Products    Avelox [moxifloxacin Hcl In Nacl] Rash   Ciprofloxacin Nausea And Vomiting   Cleocin [clindamycin Hcl] Rash   Moxifloxacin Rash    Sulfamethoxazole-trimethoprim Rash   Tape Other (See Comments)   White clear itches, rash      Medication List    STOP taking these medications   cephALEXin 125 MG/5ML suspension Commonly known as:  KEFLEX   cephALEXin 500 MG capsule Commonly known as:  KEFLEX   diphenhydrAMINE 25 MG tablet Commonly known as:  BENADRYL     TAKE these medications   ALPRAZolam 1 MG tablet Commonly known as:  XANAX Take 1 mg by mouth 4 (four) times daily as needed. For anxiety   amoxicillin 500 MG capsule Commonly known as:  AMOXIL TAKE 4 CAPSULES 30 - 60 MINUTES PRIOR TO DENTAL APPOINTMENTS.   aspirin EC 81 MG tablet Take 1 tablet (81 mg total) by mouth daily.   azelastine 0.1 % nasal spray Commonly known as:  ASTELIN Place 2 sprays into both nostrils 2 (two) times daily. Use in each nostril as directed   budesonide-formoterol 160-4.5 MCG/ACT inhaler Commonly known as:  SYMBICORT Inhale 2 puffs into the lungs 2 (two) times daily.   cetirizine 10 MG tablet Commonly known as:  ZYRTEC Take 10 mg by mouth every morning.   EPIPEN 0.3 mg/0.3 mL Soaj injection Generic drug:  EPINEPHrine Inject 0.3 mg into the muscle as needed. For allergic reaction   escitalopram 10 MG tablet Commonly known as:  LEXAPRO Take 20 mg by mouth daily.   furosemide 80 MG tablet Commonly known as:  LASIX Take 1 tablet (80 mg total)  2 (two) times daily by mouth.   gabapentin 300 MG capsule Commonly known as:  NEURONTIN Take 300 mg by mouth See admin instructions. Take 300 mg in morning and 600 at night   IRON 27 PO Take 1 tablet by mouth daily.   losartan 25 MG tablet Commonly known as:  COZAAR TAKE 1 TABLET ONCE DAILY.   magnesium oxide 400 (241.3 Mg) MG tablet Commonly known as:  MAG-OX Take 1 tablet (400 mg total) by mouth 2 (two) times daily.   metolazone 2.5 MG tablet Commonly known as:  ZAROXOLYN TAKE 1 TABLET BY MOUTH ON MONDAY, WEDNESDAY AND FRIDAY.   montelukast 10 MG tablet Commonly  known as:  SINGULAIR Take 10 mg by mouth at bedtime.   MOVANTIK 25 MG Tabs tablet Generic drug:  naloxegol oxalate Take 25 mg by mouth daily.   omeprazole 20 MG capsule Commonly known as:  PRILOSEC Take 40 mg by mouth 2 (two) times daily.   oxyCODONE-acetaminophen 10-325 MG tablet Commonly known as:  PERCOCET Take 1 tablet by mouth every 4 (four) hours as needed for pain.   OXYCONTIN 20 mg 12 hr tablet Generic drug:  oxyCODONE Take 20 mg by mouth every 12 (twelve) hours.   PATADAY 0.2 % Soln Generic drug:  Olopatadine HCl Place 1 drop into both eyes as needed (FOR ALLERGIES).   potassium chloride 10 MEQ tablet Commonly known as:  K-DUR Take 80 mEq by mouth 2 (two) times daily.   pravastatin 40 MG tablet Commonly known as:  PRAVACHOL TAKE 1 TABLET IN THE EVENING.   SPIRIVA RESPIMAT 1.25 MCG/ACT Aers Generic drug:  Tiotropium Bromide Monohydrate Inhale 2 sprays into the lungs daily.   SUMAtriptan 100 MG tablet Commonly known as:  IMITREX Take 100 mg by mouth as needed for migraine (patient states the bottle doesnt have a time based frequency). For migraines   TAZTIA XT 360 MG 24 hr capsule Generic drug:  diltiazem TAKE 1 CAPSULE BY MOUTH ONCE DAILY.   albuterol (2.5 MG/3ML) 0.083% nebulizer solution Commonly known as:  PROVENTIL Take 2.5 mg by nebulization every 6 (six) hours as needed for wheezing or shortness of breath.   VENTOLIN HFA 108 (90 Base) MCG/ACT inhaler Generic drug:  albuterol Inhale 1 puff into the lungs 4 (four) times daily as needed. For shortness of breath   warfarin 5 MG tablet Commonly known as:  COUMADIN Take as directed. If you are unsure how to take this medication, talk to your nurse or doctor. Original instructions:  Take 5 mg by mouth daily at 6 PM.       Allergies  Allergen Reactions  . Beta Adrenergic Blockers Anaphylaxis  . Doxycycline Other (See Comments)    "Swelling, dizziness, sleepy, talking out of my head, almost caused  liver failure"  . Peanut-Containing Drug Products Anaphylaxis, Shortness Of Breath, Swelling and Other (See Comments)    Swelling of the throat  . Shrimp [Shellfish Allergy] Anaphylaxis  . Vancomycin Swelling    Rash and severe swelling  . Other     Red Meat and Dairy Products   . Avelox [Moxifloxacin Hcl In Nacl] Rash  . Ciprofloxacin Nausea And Vomiting  . Cleocin [Clindamycin Hcl] Rash  . Moxifloxacin Rash  . Sulfamethoxazole-Trimethoprim Rash  . Tape Other (See Comments)    White clear itches, rash    Consultations:  none   Procedures/Studies: US Venous Img Lower Bilateral  Result Date: 09/30/2017 CLINICAL DATA:  57 year old female with bilateral lower extremity pain for  the past week. EXAM: BILATERAL LOWER EXTREMITY VENOUS DOPPLER ULTRASOUND TECHNIQUE: Gray-scale sonography with graded compression, as well as color Doppler and duplex ultrasound were performed to evaluate the lower extremity deep venous systems from the level of the common femoral vein and including the common femoral, femoral, profunda femoral, popliteal and calf veins including the posterior tibial, peroneal and gastrocnemius veins when visible. The superficial great saphenous vein was also interrogated. Spectral Doppler was utilized to evaluate flow at rest and with distal augmentation maneuvers in the common femoral, femoral and popliteal veins. COMPARISON:  None. FINDINGS: RIGHT LOWER EXTREMITY Common Femoral Vein: No evidence of thrombus. Normal compressibility, respiratory phasicity and response to augmentation. Saphenofemoral Junction: No evidence of thrombus. Normal compressibility and flow on color Doppler imaging. Profunda Femoral Vein: No evidence of thrombus. Normal compressibility and flow on color Doppler imaging. Femoral Vein: No evidence of thrombus. Normal compressibility, respiratory phasicity and response to augmentation. Popliteal Vein: No evidence of thrombus. Normal compressibility, respiratory  phasicity and response to augmentation. Calf Veins: No evidence of thrombus. Normal compressibility and flow on color Doppler imaging. Superficial Great Saphenous Vein: No evidence of thrombus. Normal compressibility. Venous Reflux:  None. Other Findings:  None. LEFT LOWER EXTREMITY Common Femoral Vein: No evidence of thrombus. Normal compressibility, respiratory phasicity and response to augmentation. Saphenofemoral Junction: No evidence of thrombus. Normal compressibility and flow on color Doppler imaging. Profunda Femoral Vein: No evidence of thrombus. Normal compressibility and flow on color Doppler imaging. Femoral Vein: No evidence of thrombus. Normal compressibility, respiratory phasicity and response to augmentation. Popliteal Vein: No evidence of thrombus. Normal compressibility, respiratory phasicity and response to augmentation. Calf Veins: No evidence of thrombus. Normal compressibility and flow on color Doppler imaging. Superficial Great Saphenous Vein: No evidence of thrombus. Normal compressibility. Venous Reflux:  None. Other Findings:  None. IMPRESSION: No evidence of deep venous thrombosis in either lower extremity. Electronically Signed   By: Jacqulynn Cadet M.D.   On: 09/30/2017 12:46   Dg Foot Complete Right  Result Date: 09/30/2017 CLINICAL DATA:  Bruising along the base of the toes. EXAM: RIGHT FOOT COMPLETE - 3+ VIEW COMPARISON:  None. FINDINGS: Lateral film shows cortical elevation in the region of the tarsometatarsal joint suggesting avulsion injury. Donor site for this fragment not evident on the provided 3 films. No other fracture or dislocation evident. There is evidence of soft tissue swelling in the midfoot. IMPRESSION: Cortical avulsion injury along the dorsal aspect of the midfoot although donor site for the fracture is not evident. Electronically Signed   By: Misty Stanley M.D.   On: 09/30/2017 01:14        Discharge Exam: Vitals:   09/30/17 0700 09/30/17 0721  BP:  139/73   Pulse: (!) 55 (!) 57  Resp: 18 20  Temp:  (!) 97.1 F (36.2 C)  SpO2: 97% 97%   Vitals:   09/30/17 0500 09/30/17 0600 09/30/17 0700 09/30/17 0721  BP:  110/72 139/73   Pulse: 84 (!) 54 (!) 55 (!) 57  Resp: (!) 21 17 18 20   Temp:    (!) 97.1 F (36.2 C)  TempSrc:    Axillary  SpO2: 96% 99% 97% 97%  Weight:      Height:        General: Pt is alert, awake, not in acute distress Cardiovascular: RRR, S1/S2 +, no rubs, no gallops Respiratory: CTA bilaterally, no wheezing, no rhonchi Abdominal: Soft, NT, ND, bowel sounds + Extremities: no edema, no cyanosis   The  results of significant diagnostics from this hospitalization (including imaging, microbiology, ancillary and laboratory) are listed below for reference.    Significant Diagnostic Studies: US Venous Img Lower Bilateral  Result Date: 09/30/2017 CLINICAL DATA:  57 year old female with bilateral lower extremity pain for the past week. EXAM: BILATERAL LOWER EXTREMITY VENOUS DOPPLER ULTRASOUND TECHNIQUE: Gray-scale sonography with graded compression, as well as color Doppler and duplex ultrasound were performed to evaluate the lower extremity deep venous systems from the level of the common femoral vein and including the common femoral, femoral, profunda femoral, popliteal and calf veins including the posterior tibial, peroneal and gastrocnemius veins when visible. The superficial great saphenous vein was also interrogated. Spectral Doppler was utilized to evaluate flow at rest and with distal augmentation maneuvers in the common femoral, femoral and popliteal veins. COMPARISON:  None. FINDINGS: RIGHT LOWER EXTREMITY Common Femoral Vein: No evidence of thrombus. Normal compressibility, respiratory phasicity and response to augmentation. Saphenofemoral Junction: No evidence of thrombus. Normal compressibility and flow on color Doppler imaging. Profunda Femoral Vein: No evidence of thrombus. Normal compressibility and flow on color  Doppler imaging. Femoral Vein: No evidence of thrombus. Normal compressibility, respiratory phasicity and response to augmentation. Popliteal Vein: No evidence of thrombus. Normal compressibility, respiratory phasicity and response to augmentation. Calf Veins: No evidence of thrombus. Normal compressibility and flow on color Doppler imaging. Superficial Great Saphenous Vein: No evidence of thrombus. Normal compressibility. Venous Reflux:  None. Other Findings:  None. LEFT LOWER EXTREMITY Common Femoral Vein: No evidence of thrombus. Normal compressibility, respiratory phasicity and response to augmentation. Saphenofemoral Junction: No evidence of thrombus. Normal compressibility and flow on color Doppler imaging. Profunda Femoral Vein: No evidence of thrombus. Normal compressibility and flow on color Doppler imaging. Femoral Vein: No evidence of thrombus. Normal compressibility, respiratory phasicity and response to augmentation. Popliteal Vein: No evidence of thrombus. Normal compressibility, respiratory phasicity and response to augmentation. Calf Veins: No evidence of thrombus. Normal compressibility and flow on color Doppler imaging. Superficial Great Saphenous Vein: No evidence of thrombus. Normal compressibility. Venous Reflux:  None. Other Findings:  None. IMPRESSION: No evidence of deep venous thrombosis in either lower extremity. Electronically Signed   By: Jacqulynn Cadet M.D.   On: 09/30/2017 12:46   Dg Foot Complete Right  Result Date: 09/30/2017 CLINICAL DATA:  Bruising along the base of the toes. EXAM: RIGHT FOOT COMPLETE - 3+ VIEW COMPARISON:  None. FINDINGS: Lateral film shows cortical elevation in the region of the tarsometatarsal joint suggesting avulsion injury. Donor site for this fragment not evident on the provided 3 films. No other fracture or dislocation evident. There is evidence of soft tissue swelling in the midfoot. IMPRESSION: Cortical avulsion injury along the dorsal aspect of the  midfoot although donor site for the fracture is not evident. Electronically Signed   By: Misty Stanley M.D.   On: 09/30/2017 01:14     Microbiology: Recent Results (from the past 240 hour(s))  MRSA PCR Screening     Status: None   Collection Time: 09/30/17  3:54 AM  Result Value Ref Range Status   MRSA by PCR NEGATIVE NEGATIVE Final    Comment:        The GeneXpert MRSA Assay (FDA approved for NASAL specimens only), is one component of a comprehensive MRSA colonization surveillance program. It is not intended to diagnose MRSA infection nor to guide or monitor treatment for MRSA infections. Performed at [facility], [address] [place], [place] [address]      Labs:  Basic Metabolic Panel: Recent Labs  Lab 09/30/17 0001 09/30/17 0805 09/30/17 0956  NA 138 139  --   K 4.3 3.2*  --   CL 95* 92*  --   CO2 35* 41*  --   GLUCOSE 109* 95  --   BUN 18 11  --   CREATININE 0.82 0.61  --   CALCIUM 8.6* 8.5*  --   MG  --   --  1.6*   Liver Function Tests: Recent Labs  Lab 09/30/17 0001 09/30/17 0805  AST 32 28  ALT 28 24  ALKPHOS 56 54  BILITOT 0.7 0.7  PROT 6.6 6.0*  ALBUMIN 3.9 3.5   No results for input(s): LIPASE, AMYLASE in the last 168 hours. Recent Labs  Lab 09/30/17 0956  AMMONIA 11   CBC: Recent Labs  Lab 09/30/17 0001 09/30/17 0805  WBC 9.5 5.8  NEUTROABS 5.8 3.9  HGB 12.4 12.2  HCT 38.7 37.4  MCV 95.3 95.4  PLT 259 200   Cardiac Enzymes: No results for input(s): CKTOTAL, CKMB, CKMBINDEX, TROPONINI in the last 168 hours. BNP: Invalid input(s): POCBNP CBG: Recent Labs  Lab 09/30/17 0003  GLUCAP 103*    Time coordinating discharge:  36 minutes  Signed:  Orson Eva, DO [facility] Pager: [phone] 09/30/2017, 1:01 PM

## 2017-09-30 NOTE — Progress Notes (Signed)
ANTICOAGULATION CONSULT NOTE - Initial Consult  Pharmacy Consult for warfarin Indication: aortic mechanical valve/atrial flutter  Allergies  Allergen Reactions  . Beta Adrenergic Blockers Anaphylaxis  . Doxycycline Other (See Comments)    "Swelling, dizziness, sleepy, talking out of my head, almost caused liver failure"  . Peanut-Containing Drug Products Anaphylaxis, Shortness Of Breath, Swelling and Other (See Comments)    Swelling of the throat  . Shrimp [Shellfish Allergy] Anaphylaxis  . Vancomycin Swelling    Rash and severe swelling  . Avelox [Moxifloxacin Hcl In Nacl] Rash  . Ciprofloxacin Nausea And Vomiting  . Cleocin [Clindamycin Hcl] Rash  . Moxifloxacin Rash  . Sulfamethoxazole-Trimethoprim Rash  . Tape Other (See Comments)    White clear itches, rash    Patient Measurements: Height: _0  (162.6 cm) Weight: 150 lb (68 kg) IBW/kg (Calculated) : 54.7  Vital Signs: Temp: 97.1 F (36.2 C) (08/03 0721) Temp Source: Axillary (08/03 0721) BP: 139/73 (08/03 0700) Pulse Rate: 57 (08/03 0721)  Labs: Recent Labs    09/30/17 0001 09/30/17 0805  HGB 12.4 12.2  HCT 38.7 37.4  PLT 259 200  LABPROT 23.9*  --   INR 2.16  --   CREATININE 0.82 0.61    Estimated Creatinine Clearance: 74.4 mL/min (by C-G formula based on SCr of 0.61 mg/dL).   Medical History: Past Medical History:  Diagnosis Date  . Anxiety   . Aortic aneurysm ([place])   . Aortic insufficiency    a. s/p Bentall with mechanical AVR 4/17 >> FU Echo 6/17: Mild LVH, EF 60-65%, normal wall motion, ventricular septum with paradoxical septal motion, [employer] mechanical AVR functioning  normally with no regurgitation (mean 6 mmHg), MAC, trivial MR, mild RVE, trivial TR  . Aortic stenosis, severe    Bicuspid valve  . Arthritis   . Asthma   . Bronchitis   . CHF (congestive heart failure) ([place])   . COPD (chronic obstructive pulmonary disease) ([place])   . DDD (degenerative disc disease), cervical   .  Depression   . Dysrhythmia    palpitations  . GERD (gastroesophageal reflux disease)   . Hard of hearing   . History of pneumonia   . 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 Migraine headache   . Palpitations   . PONV (postoperative nausea and vomiting)    "only once"  . Shortness of breath    Nodule left lung CT done 8/6  . Stress incontinence   . Urinary frequency    due to Lasix  . Valvular heart disease     Medications:  Medications Prior to Admission  Medication Sig Dispense Refill Last Dose  . albuterol (PROVENTIL) (2.5 MG/3ML) 0.083% nebulizer solution Take 2.5 mg by nebulization every 6 (six) hours as needed for wheezing or shortness of breath.   Taking  . albuterol (VENTOLIN HFA) 108 (90 BASE) MCG/ACT inhaler Inhale 1 puff into the lungs 4 (four) times daily as needed. For shortness of breath   Taking  . ALPRAZolam (XANAX) 1 MG tablet Take 1 mg by mouth 4 (four) times daily as needed. For anxiety   Taking  . amoxicillin (AMOXIL) 500 MG capsule TAKE 4 CAPSULES 30 - 60 MINUTES PRIOR TO DENTAL APPOINTMENTS. 8 capsule 2   . amoxicillin-clavulanate (AUGMENTIN) 875-125 MG tablet Take 1 tablet by mouth 2 (two) times daily. 20 tablet 0 Taking  . aspirin EC 81 MG tablet Take 1 tablet (81 mg total) by mouth daily.  Taking  . budesonide-formoterol (SYMBICORT) 160-4.5 MCG/ACT inhaler Inhale 2 puffs into the lungs 2 (two) times daily.   Taking  . cetirizine (ZYRTEC) 10 MG tablet Take 10 mg by mouth every morning.    Taking  . diphenhydrAMINE (BENADRYL) 25 MG tablet Take 1 tablet (25 mg total) by mouth every 6 (six) hours for 3 days. 12 tablet 0   . enoxaparin (LOVENOX) 60 MG/0.6ML injection Inject 0.6 mLs (60 mg total) into the skin every 12 (twelve) hours. 20 Syringe 0   . EPINEPHrine (EPIPEN) 0.3 mg/0.3 mL DEVI Inject 0.3 mg into the muscle as needed. For allergic reaction   Taking  . escitalopram (LEXAPRO) 10 MG tablet Take 10 mg by mouth daily.   Taking  .  famotidine (PEPCID) 20 MG tablet Take 1 tablet (20 mg total) by mouth 2 (two) times daily for 3 days. 6 tablet 0   . Ferrous Gluconate (IRON 27 PO) Take 1 tablet by mouth daily.   Taking  . furosemide (LASIX) 80 MG tablet Take 1 tablet (80 mg total) 2 (two) times daily by mouth. 60 tablet 6 Taking  . hydrocortisone cream 1 % Apply to affected area 2 times daily 15 g 0   . losartan (COZAAR) 25 MG tablet TAKE 1 TABLET ONCE DAILY. 30 tablet 8   . metolazone (ZAROXOLYN) 2.5 MG tablet TAKE 1 TABLET BY MOUTH ON MONDAY, WEDNESDAY AND FRIDAY. 12 tablet 8   . montelukast (SINGULAIR) 10 MG tablet Take 10 mg by mouth at bedtime.    Taking  . Olopatadine HCl (PATADAY) 0.2 % SOLN Place 1 drop into both eyes as needed (FOR ALLERGIES).    Taking  . omeprazole (PRILOSEC) 20 MG capsule Take 20 mg by mouth 3 (three) times daily.    Taking  . ondansetron (ZOFRAN) 4 MG tablet Take 1 tablet (4 mg total) by mouth every 6 (six) hours. 12 tablet 0 Taking  . oxyCODONE (OXYCONTIN) 20 mg 12 hr tablet Take 20 mg by mouth every 12 (twelve) hours.   Taking  . oxyCODONE-acetaminophen (PERCOCET) 10-325 MG tablet Take 1 tablet by mouth every 4 (four) hours as needed for pain.   Taking  . potassium chloride SA (K-DUR,KLOR-CON) 20 MEQ tablet Take 4 tablets (80 mEq total) by mouth 2 (two) times daily. 240 tablet 8   . pravastatin (PRAVACHOL) 40 MG tablet TAKE 1 TABLET IN THE EVENING. 90 tablet 2   . SUMAtriptan (IMITREX) 100 MG tablet Take 100 mg by mouth as needed for migraine (patient states the bottle doesnt have a time based frequency). For migraines   Taking  . TAZTIA XT 360 MG 24 hr capsule TAKE 1 CAPSULE BY MOUTH ONCE DAILY. 30 capsule 8   . Tiotropium Bromide Monohydrate (SPIRIVA RESPIMAT) 1.25 MCG/ACT AERS Inhale 2 sprays into the lungs daily.    Taking  . warfarin (COUMADIN) 5 MG tablet Take 1 tablet daily except 1/2 tablet on Fridays 35 tablet 4     Assessment: Pharmacy consulted to dose warfarin in a patient with an  aortic mechanical valve/atrial flutter. Her INR on admission is therapeutic at 2.16. Her warfarin dosing PTA is 5 mg daily per anticoag visit summary.  Goal of Therapy:  INR 2-3 Monitor platelets by anticoagulation protocol: Yes   Plan:  Warfarin 5 mg daily x 1 dose Monitor daily INR and s/s of bleeding  Margot Ables, PharmD Clinical Pharmacist 09/30/2017 9:40 AM

## 2017-09-30 NOTE — Progress Notes (Signed)
CAM walker left in room with patient. Education provided. Pt does not want to wear while in bed.

## 2017-09-30 NOTE — Progress Notes (Signed)
Pt discharged home. PIV removed. No complications. All instructions including medications and follow up appointments discussed with pt and her daughter; understanding verbalized. Pt taken out via [place] by RN with all belongings.

## 2017-09-30 NOTE — Progress Notes (Addendum)
PROGRESS NOTE  Lindsay Bonilla IWP:[id number] DOB: [birth year] DOA: 09/29/2017 PCP: [name], MD  Brief History:  57 year old female with a history of aortic stenosis and thoracic aortic aneurysm status post Bentall procedure with mechanical AVR 05/2015,, chronic back pain, diastolic CHF, COPD, anxiety/depression, hypertension presenting with 1 week history of lethargy that worsened to the point of difficulty to arouse on the evening of 09/29/2017.  Apparently, the patient visited the emergency department at [facility] approximately 1 week prior to this admission.  She was discharged home with Augmentin for treatment of cellulitis of her right lower extremity.  Patient states that a little over a week ago she dropped a can onto her right foot.  Despite antibiotics, the patient continued to have edema and pain of her lower extremities.  As result, she stated that that was the primary reason for her coming to the emergency department on 09/29/17.  However, the patient's daughter noted the patient to have increasing lethargy and difficulty to arouse and hypoventilation as they were driving the patient to the emergency department.  She states that the patient recently started taking some leftover baclofen that she had from November 2017 this past week because of the pain.  There is been no reports of chest pain, worsening shortness of breath, nausea, vomiting, diarrhea, abdominal pain, headache, neck pain.  There is been no fevers or chills or dysuria.  In the emergency department, the patient required 3 doses of Narcan secondary to her lethargy.  She was subsequently placed on a Narcan drip.  On the morning of 09/30/2017, the patient's mental status returned to baseline.  Assessment/Plan: Acute toxic encephalopathy -Secondary to baclofen use in the setting of chronic opioid use -The patient denies overusing her Percocet and OxyContin -She did not have her Percocet bottle, but I counted her OxyContin pills,  and she was short 3-4 pills base upon the date of her last refill -The Page has been queried for this patient--no red flags -mental status has improved -stop nalaxone drip and monitor -restart home dose percocet and monitor -d/c baclofen as brought in his abdominal wall there -TSH -ammonia -B12  Chronic pain syndrome -receives all opioids and alprazolam from Dr. Luan Pulling -d/c baclofen  Aortic Stenosis -s/p mechanical AVR 05/2015 -continue coumadin -INR 2.17 at time of admission  Essential hypertension -Holding diltiazem and losartan secondary to soft blood pressure  Chronic diastolic CHF -Appears clinically euvolemic -Daily weights -Restart home dose furosemide -07/30/2015 Echo EF 60-65%, no WMA  Depression/anxiety -Restart Lexapro and alprazolam  COPD -Restart Spiriva and Singulair and Symbicort  Hyperlipidemia -Restart statin  Lower extremity pain and edema -Venous duplex  Right foot avulsion fracture -Place patient in cam boot for comfort -outpt ortho follow up -09/30/17 right foot xray--cortical avulsion injury in the region of the tarsometatarsal joint  Hypokalemia -replete -check mag     Disposition Plan:   Home 8/4 if stable Family Communication:   Daughter updated at bedside 8/3--Total time spent 35 minutes.  Greater than 50% spent face to face counseling and coordinating care.   Consultants:  none  Code Status:  FULL   DVT Prophylaxis:  coumadin   Procedures: As Listed in Progress Note Above  Antibiotics: None    Subjective: Patient complains of right foot pain.  She denies any nausea, vomiting, diarrhea, abdominal pain, dysuria, hematuria.  She denies any headache or neck pain.  There is no fevers or chills.  Objective: Vitals:   09/30/17 0500 09/30/17 0600 09/30/17 0700 09/30/17 0721  BP:  110/72 139/73   Pulse: 84 (!) 54 (!) 55 (!) 57  Resp: (!) 21 17 18 20   Temp:    (!) 97.1 F (36.2  C)  TempSrc:    Axillary  SpO2: 96% 99% 97% 97%  Weight:      Height:        Intake/Output Summary (Last 24 hours) at 09/30/2017 0930 Last data filed at 09/30/2017 0540 Gross per 24 hour  Intake 1278.21 ml  Output -  Net 1278.21 ml   Weight change:  Exam:   General:  Pt is alert, follows commands appropriately, not in acute distress  HEENT: No icterus, No thrush, No neck mass, [place]/AT  Cardiovascular: RRR, S1/S2, no rubs, no gallops  Respiratory: Diminished breath sounds.  No wheezes or crackles.  Abdomen: Soft/+BS, non tender, non distended, no guarding  Extremities: 1 + LE edema, No lymphangitis, No petechiae, No rashes, no synovitis; scattered ecchymosis about the right foot without draining wounds.   Data Reviewed: I have personally reviewed following labs and imaging studies Basic Metabolic Panel: Recent Labs  Lab 09/30/17 0001 09/30/17 0805  NA 138 139  K 4.3 3.2*  CL 95* 92*  CO2 35* 41*  GLUCOSE 109* 95  BUN 18 11  CREATININE 0.82 0.61  CALCIUM 8.6* 8.5*   Liver Function Tests: Recent Labs  Lab 09/30/17 0001 09/30/17 0805  AST 32 28  ALT 28 24  ALKPHOS 56 54  BILITOT 0.7 0.7  PROT 6.6 6.0*  ALBUMIN 3.9 3.5   No results for input(s): LIPASE, AMYLASE in the last 168 hours. No results for input(s): AMMONIA in the last 168 hours. Coagulation Profile: Recent Labs  Lab 09/30/17 0001  INR 2.16   CBC: Recent Labs  Lab 09/30/17 0001 09/30/17 0805  WBC 9.5 5.8  NEUTROABS 5.8 3.9  HGB 12.4 12.2  HCT 38.7 37.4  MCV 95.3 95.4  PLT 259 200   Cardiac Enzymes: No results for input(s): CKTOTAL, CKMB, CKMBINDEX, TROPONINI in the last 168 hours. BNP: Invalid input(s): POCBNP CBG: Recent Labs  Lab 09/30/17 0003  GLUCAP 103*   HbA1C: No results for input(s): HGBA1C in the last 72 hours. Urine analysis:    Component Value Date/Time   COLORURINE YELLOW 11/02/2016 [place] 11/02/2016 0547   LABSPEC >1.046 (H) 11/02/2016 0547     PHURINE 6.0 11/02/2016 0547   GLUCOSEU NEGATIVE 11/02/2016 0547   HGBUR NEGATIVE 11/02/2016 0547   BILIRUBINUR NEGATIVE 11/02/2016 [place] 11/02/2016 0547   PROTEINUR NEGATIVE 11/02/2016 0547   NITRITE NEGATIVE 11/02/2016 0547   LEUKOCYTESUR NEGATIVE 11/02/2016 0547   Sepsis Labs: @LABRCNTIP (procalcitonin:4,lacticidven:4) )No results found for this or any previous visit (from the past 240 hour(s)).   Scheduled Meds: . mouth rinse  15 mL Mouth Rinse BID   Continuous Infusions: . sodium chloride 75 mL/hr at 09/30/17 0503    Procedures/Studies: Dg Foot Complete Right  Result Date: 09/30/2017 CLINICAL DATA:  Bruising along the base of the toes. EXAM: RIGHT FOOT COMPLETE - 3+ VIEW COMPARISON:  None. FINDINGS: Lateral film shows cortical elevation in the region of the tarsometatarsal joint suggesting avulsion injury. Donor site for this fragment not evident on the provided 3 films. No other fracture or dislocation evident. There is evidence of soft tissue swelling in the midfoot. IMPRESSION: Cortical avulsion injury along the dorsal aspect of the midfoot although donor site for the  fracture is not evident. Electronically Signed   By: Misty Stanley M.D.   On: 09/30/2017 01:14    Orson Eva, DO  [facility] Pager [phone]  If 7PM-7AM, please contact night-coverage [email or web address] Password [email or web address] 09/30/2017, 9:30 AM   LOS: 0 days

## 2017-10-01 LAB — T4, FREE: Free T4: 0.95 ng/dL (ref 0.82–1.77)

## 2017-10-01 LAB — VITAMIN B12: VITAMIN B 12: 55 pg/mL — AB (ref 180–914)

## 2017-10-17 ENCOUNTER — Telehealth: Payer: Self-pay | Admitting: *Deleted

## 2017-10-17 NOTE — Telephone Encounter (Signed)
Patient called stating that she was told by Coumdin Nurse of a vitamin that she can take while being on Coumdin. She would like to know the name of the vitamin.

## 2017-10-17 NOTE — Telephone Encounter (Signed)
Called patient and discussed vitamin. She states that Lattie Haw had given her the name of a new vitamin that [facility] carried that was vit K free and would be ok with her alpha gal allergy. Went through several vitamins that could potentially work, but none were the vitamin that Lattie Haw had recommended (the pharmacist at [facility] also was not sure which vitamin she was referring to). She is aware that Lattie Haw is out of the office, but requests that she call when she returns next week to let her know what vitamin it was that they had discussed. Will route message to Lattie Haw to address when she returns.

## 2017-10-19 ENCOUNTER — Ambulatory Visit (INDEPENDENT_AMBULATORY_CARE_PROVIDER_SITE_OTHER): Payer: Medicaid Other | Admitting: *Deleted

## 2017-10-19 DIAGNOSIS — I4892 Unspecified atrial flutter: Secondary | ICD-10-CM | POA: Diagnosis not present

## 2017-10-19 DIAGNOSIS — Z5181 Encounter for therapeutic drug level monitoring: Secondary | ICD-10-CM

## 2017-10-19 DIAGNOSIS — Z952 Presence of prosthetic heart valve: Secondary | ICD-10-CM

## 2017-10-19 DIAGNOSIS — I359 Nonrheumatic aortic valve disorder, unspecified: Secondary | ICD-10-CM

## 2017-10-19 LAB — POCT INR: INR: 2.5 (ref 2.0–3.0)

## 2017-10-19 NOTE — Patient Instructions (Signed)
Description   Continue coumadin 1 tablet daily.   Recheck in 4 weeks

## 2017-10-24 NOTE — Telephone Encounter (Signed)
Name of vitamin is One A Day Proactive 65+.  This vitamin does not contain Vitamin K.  Pt informed.

## 2017-10-28 ENCOUNTER — Other Ambulatory Visit: Payer: Self-pay | Admitting: Cardiovascular Disease

## 2017-10-28 DIAGNOSIS — E785 Hyperlipidemia, unspecified: Secondary | ICD-10-CM

## 2017-11-03 DIAGNOSIS — M7061 Trochanteric bursitis, right hip: Secondary | ICD-10-CM | POA: Insufficient documentation

## 2017-11-14 ENCOUNTER — Ambulatory Visit (INDEPENDENT_AMBULATORY_CARE_PROVIDER_SITE_OTHER): Payer: Medicaid Other | Admitting: *Deleted

## 2017-11-14 DIAGNOSIS — Z5181 Encounter for therapeutic drug level monitoring: Secondary | ICD-10-CM

## 2017-11-14 DIAGNOSIS — Z952 Presence of prosthetic heart valve: Secondary | ICD-10-CM | POA: Diagnosis not present

## 2017-11-14 DIAGNOSIS — I4892 Unspecified atrial flutter: Secondary | ICD-10-CM | POA: Diagnosis not present

## 2017-11-14 LAB — POCT INR: INR: 1.9 — AB (ref 2.0–3.0)

## 2017-11-14 NOTE — Patient Instructions (Signed)
Take coumadin 1 1/2 tablets tonight then resume 1 tablet daily.   Recheck in 4 weeks

## 2017-12-12 ENCOUNTER — Ambulatory Visit (INDEPENDENT_AMBULATORY_CARE_PROVIDER_SITE_OTHER): Payer: Medicaid Other | Admitting: *Deleted

## 2017-12-12 DIAGNOSIS — I4892 Unspecified atrial flutter: Secondary | ICD-10-CM

## 2017-12-12 DIAGNOSIS — Z952 Presence of prosthetic heart valve: Secondary | ICD-10-CM

## 2017-12-12 DIAGNOSIS — Z5181 Encounter for therapeutic drug level monitoring: Secondary | ICD-10-CM

## 2017-12-12 LAB — POCT INR: INR: 1.5 — AB (ref 2.0–3.0)

## 2017-12-12 NOTE — Patient Instructions (Signed)
Take coumadin 1 1/2 tablets tonight and tomorrow night then resume 1 tablet daily Recheck in 2 weeks 

## 2017-12-13 ENCOUNTER — Encounter: Payer: Self-pay | Admitting: Cardiovascular Disease

## 2017-12-13 ENCOUNTER — Ambulatory Visit: Payer: Medicaid Other | Admitting: Cardiovascular Disease

## 2017-12-13 VITALS — BP 146/76 | HR 72 | Ht 64.0 in | Wt 148.0 lb

## 2017-12-13 DIAGNOSIS — I2510 Atherosclerotic heart disease of native coronary artery without angina pectoris: Secondary | ICD-10-CM

## 2017-12-13 DIAGNOSIS — I359 Nonrheumatic aortic valve disorder, unspecified: Secondary | ICD-10-CM | POA: Diagnosis not present

## 2017-12-13 DIAGNOSIS — I10 Essential (primary) hypertension: Secondary | ICD-10-CM

## 2017-12-13 DIAGNOSIS — F17201 Nicotine dependence, unspecified, in remission: Secondary | ICD-10-CM | POA: Diagnosis not present

## 2017-12-13 DIAGNOSIS — I712 Thoracic aortic aneurysm, without rupture, unspecified: Secondary | ICD-10-CM

## 2017-12-13 DIAGNOSIS — I5032 Chronic diastolic (congestive) heart failure: Secondary | ICD-10-CM

## 2017-12-13 DIAGNOSIS — E7800 Pure hypercholesterolemia, unspecified: Secondary | ICD-10-CM

## 2017-12-13 NOTE — Progress Notes (Signed)
Chief Complaint  Patient presents with  . Follow-up    CAD   History of Present Illness: 57 yo female with history of bicuspid aortic valve s/p Ross procedure in 1998 with thoracic aortic aneurysm and then Bentall procedure in April 2017, COPD, GERD, anxiety, chronic diastolic CHF, tobacco abuse, PVCs, PACs here today for cardiac follow up. She has been followed for aortic valve insufficiency and thoracic aortic aneurysm and underwent Bentall procedure per Dr. Cyndia Bent on 06/15/15.  Cardiac cath March 2017 with mild CAD (20% RCA stenosis). Echo June 2017 with normally functioning mechanical aortic valve replacement, normal LV systolic function. Cardiac monitor June 2017 with PACs, PVCs. She has chronic diastolic CHF and is on Lasix. Normal ABI December 2018. Venous dopplers August 2019 with no evidence of DVT. Volume overload when seen in our office in October 2018. Metolazone added 3 times per week to her Lasix and her volume overload improved.   She is here today for follow up. The patient denies any chest pain, dyspnea, palpitations, lower extremity edema, orthopnea, PND, dizziness, near syncope or syncope. She thinks her valve is not clicking as loud lately. Lots of stress at home with illness of her brother. She has not been getting much sleep.   Primary Care Physician: [name], MD  Past Medical History:  Diagnosis Date  . Anxiety   . Aortic aneurysm ([place])   . Aortic insufficiency    a. s/p Bentall with mechanical AVR 4/17 >> FU Echo 6/17: Mild LVH, EF 60-65%, normal wall motion, ventricular septum with paradoxical septal motion, St. Jude mechanical AVR functioning  normally with no regurgitation (mean 6 mmHg), MAC, trivial MR, mild RVE, trivial TR  . Aortic stenosis, severe    Bicuspid valve  . Arthritis   . Asthma   . Bronchitis   . CHF (congestive heart failure) ([place])   . COPD (chronic obstructive pulmonary disease) ([place])   . DDD (degenerative disc disease), cervical   .  Depression   . Dysrhythmia    palpitations  . GERD (gastroesophageal reflux disease)   . Hard of hearing   . History of pneumonia   . 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 Migraine headache   . Palpitations   . PONV (postoperative nausea and vomiting)    "only once"  . Shortness of breath    Nodule left lung CT done 8/6  . Stress incontinence   . Urinary frequency    due to Lasix  . Valvular heart disease     Past Surgical History:  Procedure Laterality Date  . ABDOMINAL HYSTERECTOMY    . ANTERIOR CERVICAL DECOMP/DISCECTOMY FUSION  10/05/2011   Procedure: ANTERIOR CERVICAL DECOMPRESSION/DISCECTOMY FUSION 2 LEVELS;  Surgeon: Floyce Stakes, MD;  Location: [facility];  Service: Neurosurgery;  Laterality: N/A;  Cervical five - six , six - seven Anterior cervical decompression/diskectomy/fusion/plate  . ANTERIOR CRUCIATE LIGAMENT REPAIR Right   . AORTIC VALVE REPLACEMENT    . ARTHROSCOPIC REPAIR ACL  rt  . BENTALL PROCEDURE N/A 06/15/2015   Procedure: BENTALL PROCEDURE; HEMI-ARCH REPAIR WITH #23 ST JUDE MECHANICAL AVR CONDUIT AND #28 HEMASHIELD PLATINUM GRAFT;  Surgeon: Gaye Pollack, MD;  Location: [place] [facility];  Service: Open Heart Surgery;  Laterality: N/A;  . CARDIAC CATHETERIZATION N/A 05/21/2015   Procedure: Right/Left Heart Cath and Coronary Angiography;  Surgeon: [name], MD;  Location: [facility];  Service: Cardiovascular;  Laterality: N/A;  . CHOLECYSTECTOMY    .  COLONOSCOPY  10/13/05  . DIAGNOSTIC LAPAROSCOPY    . JOINT REPLACEMENT Right 2016  . KNEE ARTHROSCOPY     rt x4  x1 lft  . MYRINGOPLASTY W/ FAT GRAFT     graft from behind ear  . SIGMOIDOSCOPY  10/13/05, 09/19/05  . TEE WITHOUT CARDIOVERSION N/A 06/15/2015   Procedure: TRANSESOPHAGEAL ECHOCARDIOGRAM (TEE);  Surgeon: Gaye Pollack, MD;  Location: [facility];  Service: Open Heart Surgery;  Laterality: N/A;  . TONSILLECTOMY  2005  . TUBAL LIGATION    . TYMPANOSTOMY TUBE  PLACEMENT      Current Outpatient Medications  Medication Sig Dispense Refill  . albuterol (PROVENTIL) (2.5 MG/3ML) 0.083% nebulizer solution Take 2.5 mg by nebulization every 6 (six) hours as needed for wheezing or shortness of breath.    Marland Kitchen albuterol (VENTOLIN HFA) 108 (90 BASE) MCG/ACT inhaler Inhale 1 puff into the lungs 4 (four) times daily as needed. For shortness of breath    . ALPRAZolam (XANAX) 1 MG tablet Take 1 mg by mouth 4 (four) times daily as needed. For anxiety    . amoxicillin (AMOXIL) 500 MG capsule TAKE 4 CAPSULES 30 - 60 MINUTES PRIOR TO DENTAL APPOINTMENTS. 8 capsule 2  . aspirin EC 81 MG tablet Take 1 tablet (81 mg total) by mouth daily.    Marland Kitchen azelastine (ASTELIN) 0.1 % nasal spray Place 2 sprays into both nostrils 2 (two) times daily. Use in each nostril as directed    . budesonide-formoterol (SYMBICORT) 160-4.5 MCG/ACT inhaler Inhale 2 puffs into the lungs 2 (two) times daily.    . cetirizine (ZYRTEC) 10 MG tablet Take 10 mg by mouth every morning.     Marland Kitchen EPINEPHrine (EPIPEN) 0.3 mg/0.3 mL IJ SOAJ injection Inject 0.3 mg into the muscle as needed. For allergic reaction    . Ferrous Gluconate (IRON 27 PO) Take 1 tablet by mouth daily.    . furosemide (LASIX) 80 MG tablet Take 1 tablet (80 mg total) 2 (two) times daily by mouth. 60 tablet 6  . gabapentin (NEURONTIN) 300 MG capsule Take 300 mg by mouth See admin instructions. Take 300 mg in morning and 600 at night    . losartan (COZAAR) 25 MG tablet TAKE 1 TABLET ONCE DAILY. 30 tablet 8  . magnesium oxide (MAG-OX) 400 (241.3 Mg) MG tablet Take 1 tablet (400 mg total) by mouth 2 (two) times daily. 6 tablet 0  . metolazone (ZAROXOLYN) 2.5 MG tablet TAKE 1 TABLET BY MOUTH ON MONDAY, WEDNESDAY AND FRIDAY. 12 tablet 8  . montelukast (SINGULAIR) 10 MG tablet Take 10 mg by mouth at bedtime.     . naloxegol oxalate (MOVANTIK) 25 MG TABS tablet Take 25 mg by mouth daily.    . Olopatadine HCl (PATADAY) 0.2 % SOLN Place 1 drop into both  eyes as needed (FOR ALLERGIES).     Marland Kitchen omeprazole (PRILOSEC) 20 MG capsule Take 40 mg by mouth 2 (two) times daily.     Marland Kitchen oxyCODONE (OXYCONTIN) 20 mg 12 hr tablet Take 20 mg by mouth every 12 (twelve) hours.     Marland Kitchen oxyCODONE-acetaminophen (PERCOCET) 10-325 MG tablet Take 1 tablet by mouth every 4 (four) hours as needed for pain.    . potassium chloride (K-DUR) 10 MEQ tablet Take 80 mEq by mouth 2 (two) times daily.    . pravastatin (PRAVACHOL) 40 MG tablet TAKE 1 TABLET IN THE EVENING. 30 tablet 11  . SUMAtriptan (IMITREX) 100 MG tablet Take 100 mg by mouth as  needed for migraine (patient states the bottle doesnt have a time based frequency). For migraines    . TAZTIA XT 360 MG 24 hr capsule TAKE 1 CAPSULE BY MOUTH ONCE DAILY. 30 capsule 8  . warfarin (COUMADIN) 5 MG tablet Take 5 mg by mouth daily at 6 PM.    . Omeprazole (PRILOSEC PO) Take 40 mg by mouth daily.     No current facility-administered medications for this visit.     Allergies  Allergen Reactions  . Beta Adrenergic Blockers Anaphylaxis  . Doxycycline Other (See Comments)    "Swelling, dizziness, sleepy, talking out of my head, almost caused liver failure"  . Peanut-Containing Drug Products Anaphylaxis, Shortness Of Breath, Swelling and Other (See Comments)    Swelling of the throat  . Shrimp [Shellfish Allergy] Anaphylaxis  . Vancomycin Swelling    Rash and severe swelling  . Other     Red Meat and Dairy Products   . Avelox [Moxifloxacin Hcl In Nacl] Rash  . Ciprofloxacin Nausea And Vomiting  . Cleocin [Clindamycin Hcl] Rash  . Moxifloxacin Rash  . Sulfamethoxazole-Trimethoprim Rash  . Tape Other (See Comments)    White clear itches, rash    Social History   Socioeconomic History  . Marital status: Legally Separated    Spouse name: Not on file  . Number of children: Not on file  . Years of education: Not on file  . Highest education level: Not on file  Occupational History  . Not on file  Social Needs  .  Financial resource strain: Not on file  . Food insecurity:    Worry: Not on file    Inability: Not on file  . Transportation needs:    Medical: Not on file    Non-medical: Not on file  Tobacco Use  . Smoking status: Former Smoker    Packs/day: 0.10    Years: 30.00    Pack years: 3.00    Types: Cigarettes    Last attempt to quit: 04/29/2015    Years since quitting: 2.6  . Smokeless tobacco: Never Used  Substance and Sexual Activity  . Alcohol use: No    Alcohol/week: 0.0 standard drinks  . Drug use: No  . Sexual activity: Never  Lifestyle  . Physical activity:    Days per week: Not on file    Minutes per session: Not on file  . Stress: Not on file  Relationships  . Social connections:    Talks on phone: Not on file    Gets together: Not on file    Attends religious service: Not on file    Active member of club or organization: Not on file    Attends meetings of clubs or organizations: Not on file    Relationship status: Not on file  . Intimate partner violence:    Fear of current or ex partner: Not on file    Emotionally abused: Not on file    Physically abused: Not on file    Forced sexual activity: Not on file  Other Topics Concern  . Not on file  Social History Narrative  . Not on file    Family History  Problem Relation Age of Onset  . Heart attack Father   . Cancer Mother        Type unknown  . Diabetes Unknown        family history  . Asthma Brother   . Hyperlipidemia Unknown        family history  Review of Systems:  As stated in the HPI and otherwise negative.   BP (!) 146/76   Pulse 72   Ht _0  (1.626 m)   Wt 148 lb (67.1 kg)   SpO2 94%   BMI 25.40 kg/m   Physical Examination:  General: Well developed, well nourished, NAD  HEENT: OP clear, mucus membranes moist  SKIN: warm, dry. No rashes. Neuro: No focal deficits  Musculoskeletal: Muscle strength 5/5 all ext  Psychiatric: Mood and affect normal  Neck: No JVD, no carotid bruits, no  thyromegaly, no lymphadenopathy.  Lungs:Poor air movement both lungs. No wheezes.  Cardiovascular: Regular rate and rhythm. Loud, crisp valve click. Systolic murmur.  Abdomen:Soft. Bowel sounds present. Non-tender.  Extremities: No lower extremity edema. Pulses are trace to 1 + in the bilateral DP/PT.  Echo 07/30/15: Left ventricle: The cavity size was normal. Wall thickness was   increased in a pattern of mild LVH. Systolic function was normal.   The estimated ejection fraction was in the range of 60% to 65%.   Wall motion was normal; there were no regional wall motion   abnormalities. - Ventricular septum: Somewhat paradoxical septal motion with   intermittent respiratory flattening. - Aortic valve: Status post placement of 28 mm Hemashield graft. A   St. Jude Medical mechanical prosthesis was present. There was no   significant regurgitation. Mean gradient (S): 6 mm Hg. Peak   gradient (S): 13 mm Hg. - Mitral valve: Calcified annulus. Mildly thickened leaflets .   There was trivial regurgitation. - Right ventricle: Systolic function was mildly reduced. - Tricuspid valve: There was trivial regurgitation. - Pulmonic valve: Poorly visualized. Transvalvular gradient normal   range. Patient reported to be status post Ross procedure. - Pulmonary arteries: Systolic pressure could not be accurately   estimated. - Pericardium, extracardiac: There was no pericardial effusion.  Impressions:  - Mild LVH with LVEF 60-65%. Grossly normal diastolic function.   Somewhat paradoxical septal motion with intermittent respiratory   flattening. MAC with mildly thickened mitral leaflets and trivial   mitral regurgitation. St. Jude aortic prosthesis with grossly   normal function and no significant aortic regurgitation. Mildly   reduced RV contraction. Pulmonic valve poorly visualized, but   transvalvular gradient is normal range. Unable to assess PASP.  EKG:  EKG is not ordered today. The ekg  ordered today demonstrates   Recent Labs: 09/30/2017: ALT 24; BUN 11; Creatinine, Ser 0.61; Hemoglobin 12.2; Magnesium 1.6; Platelets 200; Potassium 3.2; Sodium 139; TSH 0.367   Lipid Panel    Component Value Date/Time   CHOL 178 10/22/2015 0933   TRIG 97 10/22/2015 0933   HDL 67 10/22/2015 0933   CHOLHDL 2.7 10/22/2015 0933   VLDL 19 10/22/2015 0933   LDLCALC 92 10/22/2015 0933   LDLDIRECT 79.0 03/25/2014 0947     Wt Readings from Last 3 Encounters:  12/13/17 148 lb (67.1 kg)  09/29/17 150 lb (68 kg)  06/18/17 165 lb (74.8 kg)     Other studies Reviewed: Additional studies/ records that were reviewed today include: . Review of the above records demonstrates:    Assessment and Plan:   1. Aortic valve disease: She is s/p mechanical AVR in 2017. Continue coumadin and SBE prophylaxis.    She thinks her valve click is not loud anymore. Her exam is normal. Will arrange echo to assess her mechanical aortic valve.   2. Thoracic aortic aneurysm: She is s/p Bentall procedure 2017.  3. Tobacco abuse, in remission: She  has stopped smoking.   4. Palpitations/PVCs/PACs: PACs, PVCs noted on monitor June 2017. She has rare palpitations. Will continue Diltiazem.    5. HLD: Lipids in primary care but none recently. Will continue statin. Will check lipids.   6. Acute on Chronic diastolic CHF: Weight is stable. Volume status is ok. She is using Lasix several days per week.   7. CAD: Mild RCA plaque by cath 2017. Will continue ASA and statin.   8. HTN: BP is controlled at home. No changes  8. Leg pain: Normal ABI December 2018.     Current medicines are reviewed at length with the patient today.  The patient does not have concerns regarding medicines.  The following changes have been made:  no change  Labs/ tests ordered today include:   Orders Placed This Encounter  Procedures  . Lipid Profile  . ECHOCARDIOGRAM COMPLETE    Disposition:   FU with me in 12  months  Signed, Lauree Chandler, MD 12/13/2017 12:56 PM    [address], [place], [place]  [address] Phone: [phone]; Fax: [phone]

## 2017-12-13 NOTE — Patient Instructions (Signed)
Medication Instructions:  Your physician recommends that you continue on your current medications as directed. Please refer to the Current Medication list given to you today.  If you need a refill on your cardiac medications before your next appointment, please call your pharmacy.   Lab work: Your physician recommends that you return for lab work on day of echo--Lipid profile.  This will be fasting.  If you have labs (blood work) drawn today and your tests are completely normal, you will receive your results only by: Marland Kitchen MyChart Message (if you have MyChart) OR . A paper copy in the mail If you have any lab test that is abnormal or we need to change your treatment, we will call you to review the results.  Testing/Procedures: Your physician has requested that you have an echocardiogram. Echocardiography is a painless test that uses sound waves to create images of your heart. It provides your doctor with information about the size and shape of your heart and how well your heart's chambers and valves are working. This procedure takes approximately one hour. There are no restrictions for this procedure.    Follow-Up: At [facility], you and your health needs are our priority.  As part of our continuing mission to provide you with exceptional heart care, we have created designated Provider Care Teams.  These Care Teams include your primary Cardiologist (physician) and Advanced Practice Providers (APPs -  Physician Assistants and Nurse Practitioners) who all work together to provide you with the care you need, when you need it. You will need a follow up appointment in 12 months.  Please call our office 2 months in advance to schedule this appointment.  You may see Lauree Chandler, MD or one of the following Advanced Practice Providers on your designated Care Team:   [place], PA-C Melina Copa, PA-C . Ermalinda Barrios, PA-C  Any Other Special Instructions Will Be Listed Below (If  Applicable).

## 2017-12-22 ENCOUNTER — Other Ambulatory Visit: Payer: Medicaid Other

## 2017-12-22 ENCOUNTER — Other Ambulatory Visit (HOSPITAL_COMMUNITY): Payer: Medicaid Other

## 2017-12-28 ENCOUNTER — Ambulatory Visit (INDEPENDENT_AMBULATORY_CARE_PROVIDER_SITE_OTHER): Payer: Medicaid Other | Admitting: *Deleted

## 2017-12-28 DIAGNOSIS — I4892 Unspecified atrial flutter: Secondary | ICD-10-CM | POA: Diagnosis not present

## 2017-12-28 DIAGNOSIS — Z5181 Encounter for therapeutic drug level monitoring: Secondary | ICD-10-CM | POA: Diagnosis not present

## 2017-12-28 LAB — POCT INR: INR: 1.2 — AB (ref 2.0–3.0)

## 2017-12-28 NOTE — Patient Instructions (Signed)
Take coumadin 2 tablets tonight and tomorrow then 1 1/2 tablets daily until INR check on 01/02/18

## 2018-01-02 ENCOUNTER — Ambulatory Visit: Payer: Medicaid Other | Admitting: *Deleted

## 2018-01-02 DIAGNOSIS — Z5181 Encounter for therapeutic drug level monitoring: Secondary | ICD-10-CM

## 2018-01-02 DIAGNOSIS — I4892 Unspecified atrial flutter: Secondary | ICD-10-CM

## 2018-01-02 DIAGNOSIS — Z952 Presence of prosthetic heart valve: Secondary | ICD-10-CM

## 2018-01-02 LAB — POCT INR: INR: 2.8 (ref 2.0–3.0)

## 2018-01-02 NOTE — Patient Instructions (Signed)
Restart coumadin 1 tablet daily  Recheck in 2 weeks

## 2018-01-12 ENCOUNTER — Telehealth: Payer: Self-pay

## 2018-01-12 NOTE — Telephone Encounter (Signed)
New message    Just an FYI. We have made several attempts to contact this patient including sending a letter to schedule or reschedule their echocardiogram. We will be removing the patient from the echo WQ.   Thank you 

## 2018-01-23 ENCOUNTER — Telehealth: Payer: Self-pay | Admitting: Cardiovascular Disease

## 2018-01-23 DIAGNOSIS — I359 Nonrheumatic aortic valve disorder, unspecified: Secondary | ICD-10-CM

## 2018-01-23 NOTE — Telephone Encounter (Signed)
New Message   Pt called to reschedule her echo but the order has been cancelled and the pt states she really needs the test done due to her not being able to hear her valve tick. Please call

## 2018-01-23 NOTE — Telephone Encounter (Signed)
I spoke with pt and rescheduled echo and fasting lab work to 01/31/18 at 10:30

## 2018-01-30 ENCOUNTER — Other Ambulatory Visit (HOSPITAL_COMMUNITY): Payer: Medicaid Other

## 2018-01-31 ENCOUNTER — Other Ambulatory Visit: Payer: Medicaid Other | Admitting: *Deleted

## 2018-01-31 ENCOUNTER — Telehealth: Payer: Self-pay | Admitting: *Deleted

## 2018-01-31 ENCOUNTER — Other Ambulatory Visit: Payer: Self-pay

## 2018-01-31 ENCOUNTER — Ambulatory Visit (HOSPITAL_COMMUNITY): Payer: Medicaid Other | Attending: Cardiology

## 2018-01-31 DIAGNOSIS — I359 Nonrheumatic aortic valve disorder, unspecified: Secondary | ICD-10-CM | POA: Diagnosis present

## 2018-01-31 DIAGNOSIS — I2510 Atherosclerotic heart disease of native coronary artery without angina pectoris: Secondary | ICD-10-CM

## 2018-01-31 DIAGNOSIS — E7800 Pure hypercholesterolemia, unspecified: Secondary | ICD-10-CM

## 2018-01-31 LAB — LIPID PANEL
Chol/HDL Ratio: 2.9 ratio (ref 0.0–4.4)
Cholesterol, Total: 169 mg/dL (ref 100–199)
HDL: 58 mg/dL (ref >39)
LDL Calculated: 77 mg/dL (ref 0–99)
Triglycerides: 168 mg/dL — ABNORMAL HIGH (ref 0–149)
VLDL CHOLESTEROL CAL: 34 mg/dL (ref 5–40)

## 2018-01-31 NOTE — Telephone Encounter (Signed)
I placed call to pt and left message to call office.  

## 2018-01-31 NOTE — Telephone Encounter (Signed)
-----   Message from [name], MD sent at 01/31/2018  4:22 PM EST ----- LDL near goal. Continue statin. cdm

## 2018-02-07 NOTE — Telephone Encounter (Signed)
Pt has been notified.  See lab results

## 2018-02-08 ENCOUNTER — Ambulatory Visit: Payer: Medicaid Other | Admitting: *Deleted

## 2018-02-08 DIAGNOSIS — Z952 Presence of prosthetic heart valve: Secondary | ICD-10-CM | POA: Diagnosis not present

## 2018-02-08 DIAGNOSIS — Z5181 Encounter for therapeutic drug level monitoring: Secondary | ICD-10-CM

## 2018-02-08 DIAGNOSIS — I4892 Unspecified atrial flutter: Secondary | ICD-10-CM

## 2018-02-08 LAB — POCT INR: INR: 2.4 (ref 2.0–3.0)

## 2018-02-08 NOTE — Patient Instructions (Signed)
Restart coumadin 1 tablet daily  Recheck in 4 weeks

## 2018-03-08 ENCOUNTER — Telehealth: Payer: Self-pay | Admitting: Cardiovascular Disease

## 2018-03-08 NOTE — Telephone Encounter (Signed)
Patient was in ED at [facility] last night due to falling. Stated that they checked her INR and it was a 3 She is too sore today to come and would like to know what she needs to do about her dosage and when her next appointment should be

## 2018-03-08 NOTE — Telephone Encounter (Signed)
Spoke with Patients daughter about warfarin dose and advised to continue same dosage as she reports INR was 3.0. Advised that will need to have her rescheduled in our clinic as that result was not sent to our office. Appt rescheduled.

## 2018-03-11 ENCOUNTER — Other Ambulatory Visit: Payer: Self-pay | Admitting: Cardiovascular Disease

## 2018-03-13 ENCOUNTER — Encounter: Payer: Self-pay | Admitting: Family Medicine

## 2018-03-22 ENCOUNTER — Ambulatory Visit: Payer: Medicaid Other | Admitting: Pharmacist

## 2018-03-22 DIAGNOSIS — Z952 Presence of prosthetic heart valve: Secondary | ICD-10-CM | POA: Diagnosis not present

## 2018-03-22 DIAGNOSIS — I359 Nonrheumatic aortic valve disorder, unspecified: Secondary | ICD-10-CM

## 2018-03-22 DIAGNOSIS — Z5181 Encounter for therapeutic drug level monitoring: Secondary | ICD-10-CM | POA: Diagnosis not present

## 2018-03-22 DIAGNOSIS — I4892 Unspecified atrial flutter: Secondary | ICD-10-CM

## 2018-03-22 LAB — POCT INR: INR: 1.8 — AB (ref 2.0–3.0)

## 2018-03-22 NOTE — Patient Instructions (Signed)
Description   Take 1.5 tablet today then continue coumadin 1 tablet daily  Recheck in 3 weeks

## 2018-03-23 ENCOUNTER — Other Ambulatory Visit: Payer: Self-pay | Admitting: Cardiovascular Disease

## 2018-03-25 ENCOUNTER — Other Ambulatory Visit: Payer: Self-pay | Admitting: Cardiovascular Disease

## 2018-04-12 ENCOUNTER — Ambulatory Visit: Payer: Medicaid Other | Admitting: Pharmacist

## 2018-04-12 DIAGNOSIS — I359 Nonrheumatic aortic valve disorder, unspecified: Secondary | ICD-10-CM

## 2018-04-12 DIAGNOSIS — Z5181 Encounter for therapeutic drug level monitoring: Secondary | ICD-10-CM

## 2018-04-12 DIAGNOSIS — Z952 Presence of prosthetic heart valve: Secondary | ICD-10-CM | POA: Diagnosis not present

## 2018-04-12 DIAGNOSIS — I4892 Unspecified atrial flutter: Secondary | ICD-10-CM | POA: Diagnosis not present

## 2018-04-12 LAB — POCT INR: INR: 1.9 — AB (ref 2.0–3.0)

## 2018-04-12 NOTE — Patient Instructions (Signed)
Description   Change coumadin to 1 tablet daily, except 1 1/2 tablets on Thursdays.  Recheck in 3 weeks

## 2018-04-27 ENCOUNTER — Ambulatory Visit (HOSPITAL_COMMUNITY)
Admission: RE | Admit: 2018-04-27 | Discharge: 2018-04-27 | Disposition: A | Discharge status: Home / Self Care | Payer: Medicaid Other | Source: Ambulatory Visit | Attending: Pulmonary Disease | Admitting: Pulmonary Disease

## 2018-04-27 ENCOUNTER — Other Ambulatory Visit (HOSPITAL_COMMUNITY): Payer: Self-pay | Admitting: Pulmonary Disease

## 2018-04-27 DIAGNOSIS — M25511 Pain in right shoulder: Secondary | ICD-10-CM

## 2018-05-03 ENCOUNTER — Ambulatory Visit: Payer: Medicaid Other | Admitting: *Deleted

## 2018-05-03 DIAGNOSIS — I4892 Unspecified atrial flutter: Secondary | ICD-10-CM

## 2018-05-03 DIAGNOSIS — Z5181 Encounter for therapeutic drug level monitoring: Secondary | ICD-10-CM

## 2018-05-03 DIAGNOSIS — Z952 Presence of prosthetic heart valve: Secondary | ICD-10-CM | POA: Diagnosis not present

## 2018-05-03 DIAGNOSIS — I359 Nonrheumatic aortic valve disorder, unspecified: Secondary | ICD-10-CM

## 2018-05-03 LAB — POCT INR: INR: 2.9 (ref 2.0–3.0)

## 2018-05-03 NOTE — Patient Instructions (Signed)
Continue coumadin 1 tablet daily except 1 1/2 tablets on Thursdays Recheck in 4 weeks 

## 2018-05-30 ENCOUNTER — Telehealth: Payer: Self-pay | Admitting: *Deleted

## 2018-05-30 NOTE — Telephone Encounter (Signed)
° ° °  COVID-19 Pre-Screening Questions:   Do you currently have a fever? (yes = cancel and refer to pcp for e-visit)  NO  Have you recently travelled on a cruise, internationally, or to [place], [place], [place], [place], [place], or [place], [place] [employer]) ?  (yes = cancel, stay home, monitor symptoms, and contact pcp or initiate e-visit if symptoms develop) NO  Have you been in contact with someone that is currently pending confirmation of Covid19 testing or has been confirmed to have the [place] virus? O(yes = cancel, stay home, away from tested individual, monitor symptoms, and contact pcp or initiate e-visit if symptoms develop) NO  Are you currently experiencing fatigue or cough? NO   PATIENT HAS COPD    (yes = pt should be prepared to have a mask placed at the time of their visit).

## 2018-05-31 ENCOUNTER — Ambulatory Visit (INDEPENDENT_AMBULATORY_CARE_PROVIDER_SITE_OTHER): Payer: Medicaid Other | Admitting: *Deleted

## 2018-05-31 ENCOUNTER — Other Ambulatory Visit: Payer: Self-pay

## 2018-05-31 DIAGNOSIS — Z952 Presence of prosthetic heart valve: Secondary | ICD-10-CM

## 2018-05-31 DIAGNOSIS — Z5181 Encounter for therapeutic drug level monitoring: Secondary | ICD-10-CM | POA: Diagnosis not present

## 2018-05-31 DIAGNOSIS — I4892 Unspecified atrial flutter: Secondary | ICD-10-CM

## 2018-05-31 LAB — POCT INR: INR: 3.8 — AB (ref 2.0–3.0)

## 2018-05-31 MED ORDER — WARFARIN SODIUM 5 MG PO TABS: ORAL_TABLET | ORAL | 3 refills | Status: DC

## 2018-05-31 NOTE — Patient Instructions (Signed)
Hold coumadin tonight then decrease dose to 1 tablet daily  Recheck in 4 weeks 

## 2018-06-05 ENCOUNTER — Other Ambulatory Visit: Payer: Self-pay | Admitting: Cardiovascular Disease

## 2018-06-14 ENCOUNTER — Other Ambulatory Visit: Payer: Self-pay | Admitting: Cardiovascular Disease

## 2018-06-16 ENCOUNTER — Other Ambulatory Visit: Payer: Self-pay | Admitting: Physician Assistant

## 2018-06-18 ENCOUNTER — Other Ambulatory Visit: Payer: Self-pay | Admitting: Cardiovascular Disease

## 2018-06-19 ENCOUNTER — Other Ambulatory Visit: Payer: Self-pay

## 2018-06-19 MED ORDER — FUROSEMIDE 80 MG PO TABS: 80.0000 mg | ORAL_TABLET | Freq: Two times a day (BID) | ORAL | 6 refills | Status: DC

## 2018-06-19 NOTE — Telephone Encounter (Signed)
Refilled lasix 80 mg bid to [facility] pharmacy per fax request

## 2018-06-22 ENCOUNTER — Other Ambulatory Visit: Payer: Self-pay | Admitting: Cardiovascular Disease

## 2018-06-25 ENCOUNTER — Telehealth: Payer: Self-pay | Admitting: Cardiovascular Disease

## 2018-06-25 ENCOUNTER — Other Ambulatory Visit: Payer: Self-pay

## 2018-06-25 ENCOUNTER — Other Ambulatory Visit: Payer: Self-pay | Admitting: Cardiovascular Disease

## 2018-06-25 DIAGNOSIS — I5032 Chronic diastolic (congestive) heart failure: Secondary | ICD-10-CM

## 2018-06-25 MED ORDER — DILTIAZEM HCL ER BEADS 360 MG PO CP24: 360.0000 mg | ORAL_CAPSULE | Freq: Every day | ORAL | 8 refills | Status: DC

## 2018-06-25 NOTE — Telephone Encounter (Signed)
New Message   Pt c/o medication issue:  1. Name of Medication: Diltiazem  2. How are you currently taking this medication (dosage and times per day)? 360mg  once a day  3. Are you having a reaction (difficulty breathing--STAT)? NO  4. What is your medication issue? Patient needs a refill and the medication isn't on her med list so the pharmacy won't refill it. Please call her to get med issue straightened out.

## 2018-06-25 NOTE — Telephone Encounter (Signed)
Called pt who is upset d/t not being able to get her TAZTIA XT. She expressed frustration with her pharmacy who stated it was not on her med list, faxed her doctors office (unsure which office) who denied med request.   I called pharmacy in [place] at [phone] and clarified pt should receive medication and sent refill as requested by pharmacy.   Advised pharmacy and pt to call back with any other concerns.

## 2018-06-26 ENCOUNTER — Telehealth: Payer: Self-pay | Admitting: *Deleted

## 2018-06-26 NOTE — Telephone Encounter (Signed)

## 2018-06-27 ENCOUNTER — Ambulatory Visit (INDEPENDENT_AMBULATORY_CARE_PROVIDER_SITE_OTHER): Payer: Medicaid Other | Admitting: Pharmacist Clinician (PhC)/ Clinical Pharmacy Specialist

## 2018-06-27 DIAGNOSIS — Z952 Presence of prosthetic heart valve: Secondary | ICD-10-CM

## 2018-06-27 DIAGNOSIS — I4892 Unspecified atrial flutter: Secondary | ICD-10-CM

## 2018-06-27 DIAGNOSIS — Z5181 Encounter for therapeutic drug level monitoring: Secondary | ICD-10-CM

## 2018-06-27 DIAGNOSIS — I359 Nonrheumatic aortic valve disorder, unspecified: Secondary | ICD-10-CM

## 2018-06-27 LAB — POCT INR: INR: 2 (ref 2.0–3.0)

## 2018-07-06 ENCOUNTER — Other Ambulatory Visit: Payer: Self-pay

## 2018-07-09 MED ORDER — POTASSIUM CHLORIDE ER 10 MEQ PO TBCR: 10.0000 meq | EXTENDED_RELEASE_TABLET | Freq: Two times a day (BID) | ORAL | 1 refills | Status: DC

## 2018-07-18 NOTE — Telephone Encounter (Signed)
I placed call to pt's daughter.  She is not currently with pt but reports pt has some swelling at times but yesterday it was worse than usual.  She took lasix 80 mg at 4 PM and 10 PM yesterday.  Also was having some shortness of breath.  Pt is on twice daily dose of Lasix 80 mg and daughter reports pt was out most of day yesterday due to family emergency and did not take any lasix prior to 4 PM dose.  I told daughter increased swelling could be occurring due to missed lasix doses during the day yesterday.  I asked her to have pt contact us this morning if she was still having any problems.

## 2018-07-25 ENCOUNTER — Ambulatory Visit (INDEPENDENT_AMBULATORY_CARE_PROVIDER_SITE_OTHER): Payer: Medicaid Other | Admitting: *Deleted

## 2018-07-25 ENCOUNTER — Other Ambulatory Visit (HOSPITAL_COMMUNITY): Payer: Self-pay | Admitting: Pulmonary Disease

## 2018-07-25 ENCOUNTER — Other Ambulatory Visit: Payer: Self-pay | Admitting: Pulmonary Disease

## 2018-07-25 DIAGNOSIS — I4892 Unspecified atrial flutter: Secondary | ICD-10-CM | POA: Diagnosis not present

## 2018-07-25 DIAGNOSIS — M25511 Pain in right shoulder: Secondary | ICD-10-CM

## 2018-07-25 DIAGNOSIS — Z952 Presence of prosthetic heart valve: Secondary | ICD-10-CM

## 2018-07-25 DIAGNOSIS — Z5181 Encounter for therapeutic drug level monitoring: Secondary | ICD-10-CM | POA: Diagnosis not present

## 2018-07-25 LAB — POCT INR: INR: 2.2 (ref 2.0–3.0)

## 2018-07-25 NOTE — Patient Instructions (Signed)
Continue with 1 tablet daily.  Recheck in 4 weeks

## 2018-07-31 ENCOUNTER — Ambulatory Visit (HOSPITAL_COMMUNITY): Payer: Medicaid Other

## 2018-08-02 ENCOUNTER — Telehealth: Payer: Self-pay | Admitting: Cardiovascular Disease

## 2018-08-02 NOTE — Telephone Encounter (Signed)
I spoke to patient and daughter with recommendations.

## 2018-08-02 NOTE — Telephone Encounter (Signed)
° ° °  Pt c/o swelling: STAT is pt has developed SOB within 24 hours  1) How much weight have you gained and in what time span? 14 lbs in 2weeks  2) If swelling, where is the swelling located? LEGS, FEET, ANKLES  3) Are you currently taking a fluid pill? YES  4) Are you currently SOB? NO at this moment, Yes, when walking around  5) Do you have a log of your daily weights (if so, list)? 156 on June3  6) Have you gained 3 pounds in a day or 5 pounds in a week?   7) Have you traveled recently? NO

## 2018-08-02 NOTE — Telephone Encounter (Signed)
Pt was added onto Dietrich Pates schedule for in office visit 08/03/2018, called and done the prescreening questions:      COVID-19 Pre-Screening Questions:  . In the past 7 to 10 days have you had a cough,  shortness of breath, headache, congestion, fever (100 or greater) body aches, chills, sore throat, or sudden loss of taste or sense of smell? . Have you been around anyone with known Covid 19. . Have you been around anyone who is awaiting Covid 19 test results in the past 7 to 10 days? . Have you been around anyone who has been exposed to Covid 19, or has mentioned symptoms of Covid 19 within the past 7 to 10 days?  If you have any concerns/questions about symptoms patients report during screening (either on the phone or at threshold). Contact the provider seeing the patient or DOD for further guidance.  If neither are available contact a member of the leadership team.

## 2018-08-02 NOTE — Telephone Encounter (Signed)
I would have her take Lasix 80 mg po TID for the next three days and continue the metolazone as she is currently using. She will need to have a virtual visit next week with an office APP. I am out of town next week. Thanks, chris

## 2018-08-02 NOTE — Telephone Encounter (Signed)
I spoke to the patient and her daughter and they are calling because she saw Dr Luan Pulling her PCP recently and he advised a visit with Dr [name] because the patient has experienced feet/legs swelling, SOB and weight gain 14 lbs in 2 weeks.    She is on Lasix 80 mg bid.

## 2018-08-03 ENCOUNTER — Ambulatory Visit
Admission: RE | Admit: 2018-08-03 | Discharge: 2018-08-03 | Disposition: A | Discharge status: Home / Self Care | Payer: Medicaid Other | Source: Ambulatory Visit | Attending: Cardiology | Admitting: Cardiology

## 2018-08-03 ENCOUNTER — Ambulatory Visit (INDEPENDENT_AMBULATORY_CARE_PROVIDER_SITE_OTHER): Payer: Medicaid Other | Admitting: Cardiology

## 2018-08-03 ENCOUNTER — Encounter: Payer: Self-pay | Admitting: Cardiology

## 2018-08-03 ENCOUNTER — Other Ambulatory Visit: Payer: Self-pay

## 2018-08-03 ENCOUNTER — Telehealth: Payer: Self-pay | Admitting: Radiology

## 2018-08-03 VITALS — BP 132/58 | HR 48 | Ht 64.0 in | Wt 150.0 lb

## 2018-08-03 DIAGNOSIS — Z5181 Encounter for therapeutic drug level monitoring: Secondary | ICD-10-CM

## 2018-08-03 DIAGNOSIS — R600 Localized edema: Secondary | ICD-10-CM | POA: Diagnosis not present

## 2018-08-03 DIAGNOSIS — Z952 Presence of prosthetic heart valve: Secondary | ICD-10-CM

## 2018-08-03 DIAGNOSIS — I5032 Chronic diastolic (congestive) heart failure: Secondary | ICD-10-CM | POA: Diagnosis not present

## 2018-08-03 DIAGNOSIS — I10 Essential (primary) hypertension: Secondary | ICD-10-CM | POA: Diagnosis not present

## 2018-08-03 DIAGNOSIS — I5033 Acute on chronic diastolic (congestive) heart failure: Secondary | ICD-10-CM

## 2018-08-03 DIAGNOSIS — I2510 Atherosclerotic heart disease of native coronary artery without angina pectoris: Secondary | ICD-10-CM

## 2018-08-03 DIAGNOSIS — I712 Thoracic aortic aneurysm, without rupture, unspecified: Secondary | ICD-10-CM

## 2018-08-03 DIAGNOSIS — I495 Sick sinus syndrome: Secondary | ICD-10-CM

## 2018-08-03 DIAGNOSIS — R0602 Shortness of breath: Secondary | ICD-10-CM

## 2018-08-03 LAB — COMPREHENSIVE METABOLIC PANEL
ALT: 19 IU/L (ref 0–32)
AST: 27 IU/L (ref 0–40)
Albumin/Globulin Ratio: 2.4 — ABNORMAL HIGH (ref 1.2–2.2)
Albumin: 4.7 g/dL (ref 3.8–4.9)
Alkaline Phosphatase: 87 IU/L (ref 39–117)
BUN/Creatinine Ratio: 13 (ref 9–23)
BUN: 12 mg/dL (ref 6–24)
Bilirubin Total: 0.5 mg/dL (ref 0.0–1.2)
CO2: 30 mmol/L — ABNORMAL HIGH (ref 20–29)
Calcium: 9.7 mg/dL (ref 8.7–10.2)
Chloride: 95 mmol/L — ABNORMAL LOW (ref 96–106)
Creatinine, Ser: 0.94 mg/dL (ref 0.57–1.00)
GFR calc Af Amer: 78 mL/min/{1.73_m2} (ref >59)
GFR calc non Af Amer: 68 mL/min/{1.73_m2} (ref >59)
Globulin, Total: 2 g/dL (ref 1.5–4.5)
Glucose: 130 mg/dL — ABNORMAL HIGH (ref 65–99)
Potassium: 4.1 mmol/L (ref 3.5–5.2)
Sodium: 141 mmol/L (ref 134–144)
Total Protein: 6.7 g/dL (ref 6.0–8.5)

## 2018-08-03 LAB — PRO B NATRIURETIC PEPTIDE: NT-Pro BNP: 494 pg/mL — ABNORMAL HIGH (ref 0–287)

## 2018-08-03 LAB — CBC
Hematocrit: 41.3 % (ref 34.0–46.6)
Hemoglobin: 14 g/dL (ref 11.1–15.9)
MCH: 29.8 pg (ref 26.6–33.0)
MCHC: 33.9 g/dL (ref 31.5–35.7)
MCV: 88 fL (ref 79–97)
Platelets: 322 10*3/uL (ref 150–450)
RBC: 4.7 x10E6/uL (ref 3.77–5.28)
RDW: 12 % (ref 11.7–15.4)
WBC: 8.2 10*3/uL (ref 3.4–10.8)

## 2018-08-03 LAB — MAGNESIUM: Magnesium: 1.6 mg/dL (ref 1.6–2.3)

## 2018-08-03 MED ORDER — DILTIAZEM HCL ER BEADS 300 MG PO CP24: 300.0000 mg | ORAL_CAPSULE | Freq: Every day | ORAL | 1 refills | Status: DC

## 2018-08-03 NOTE — Progress Notes (Signed)
Cardiology Office Note   Date:  08/03/2018   ID:  Lindsay Bonilla, DOB Aug 24, 1960, MRN [id number]  PCP:  Lindsay Du, Bonilla  Cardiologist: Dr. Angelena Bonilla     Chief Complaint  Patient presents with  . Leg Swelling      History of Present Illness: Lindsay Bonilla is a 58 y.o. female who presents for leg edema, very bad, fast and slow HR, at times very slow HR.  Increased DOE she also stated Dr. Luan Bonilla had wanted her to be seen by cardiology.  .    She has a history of bicuspid aortic valve s/p Ross procedure in 1998 with thoracic aortic aneurysm and then Bentall procedure in April 2017, COPD, GERD, anxiety, chronic diastolic CHF, tobacco abuse, PVCs, PACs here today for cardiac follow up. She has been followed for aortic valve insufficiency and thoracic aortic aneurysm and underwent Bentall procedure per Lindsay Bonilla on 06/15/15.  Cardiac cath March 2017 with mild CAD (20% RCA stenosis). Echo June 2017 with normally functioning mechanical aortic valve replacement, normal LV systolic function. Cardiac monitor June 2017 with PACs, PVCs. She has chronic diastolic CHF and is on Lasix. Normal ABI December 2018. Venous dopplers August 2019 with no evidence of DVT. Volume overload when seen in our office in October 2018. Metolazone added 3 times per week to her Lasix and her volume overload improved.   On last visit 12/13/17.  She thinks her valve is not clicking as loud lately. Lots of stress at home with illness of her brother. She had not been getting much sleep.  Echo 01/31/18 with normal LV function EF 60-65%  and AVR was working well and mild MR. Stable.   Today she complains of lower ext edema, lasix is increased to 80 mg TID for 3 days and continue metolazone    Last INR 2.2  Our office follows. Her lower ext edema is not bad this AM but by late day she can hardly walk.  So much swelling has to loosen her shoes and has to take off support stockings.  She is being treated for cellulitis with  amoxicillin by Dr. Lanetta Bonilla.   No colds or fevers or cough.  Wears mask when out.   She notes her HR is pounding at times and other times very slow and she feels it will stop, this is increased since she feels the click of her heart so strongly.  She has DOE can hardly get around at times, she continues to wear her oxygen 24/7.  Has not been eating salt.   Her HR today is 48 and with walking stayed at 46-48.  Her sp02 with walking was 97%.  She has had recent echo and stable.      Past Medical History:  Diagnosis Date  . Anxiety   . Aortic aneurysm ([place])   . Aortic insufficiency    a. s/p Bentall with mechanical AVR 4/17 >> FU Echo 6/17: Mild LVH, EF 60-65%, normal wall motion, ventricular septum with paradoxical septal motion, St. Jude mechanical AVR functioning  normally with no regurgitation (mean 6 mmHg), MAC, trivial MR, mild RVE, trivial TR  . Aortic stenosis, severe    Bicuspid valve  . Arthritis   . Asthma   . Bronchitis   . CHF (congestive heart failure) ([place])   . COPD (chronic obstructive pulmonary disease) ([place])   . DDD (degenerative disc disease), cervical   . Depression   . Dysrhythmia    palpitations  . GERD (gastroesophageal reflux disease)   .  Hard of hearing   . History of pneumonia   . 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 Migraine headache   . Palpitations   . PONV (postoperative nausea and vomiting)    "only once"  . Shortness of breath    Nodule left lung CT done 8/6  . Stress incontinence   . Urinary frequency    due to Lasix  . Valvular heart disease     Past Surgical History:  Procedure Laterality Date  . ABDOMINAL HYSTERECTOMY    . ANTERIOR CERVICAL DECOMP/DISCECTOMY FUSION  10/05/2011   Procedure: ANTERIOR CERVICAL DECOMPRESSION/DISCECTOMY FUSION 2 LEVELS;  Surgeon: Lindsay Stakes, Bonilla;  Location: [facility];  Service: Neurosurgery;  Laterality: N/A;  Cervical five - six , six - seven Anterior cervical  decompression/diskectomy/fusion/plate  . ANTERIOR CRUCIATE LIGAMENT REPAIR Right   . AORTIC VALVE REPLACEMENT    . ARTHROSCOPIC REPAIR ACL  rt  . BENTALL PROCEDURE N/A 06/15/2015   Procedure: BENTALL PROCEDURE; HEMI-ARCH REPAIR WITH #23 ST JUDE MECHANICAL AVR CONDUIT AND #28 HEMASHIELD PLATINUM GRAFT;  Surgeon: Lindsay Bonilla;  Location: [facility];  Service: Open Heart Surgery;  Laterality: N/A;  . CARDIAC CATHETERIZATION N/A 05/21/2015   Procedure: Right/Left Heart Cath and Coronary Angiography;  Surgeon: Lindsay Blanks, Bonilla;  Location: [facility];  Service: Cardiovascular;  Laterality: N/A;  . CHOLECYSTECTOMY    . COLONOSCOPY  10/13/05  . DIAGNOSTIC LAPAROSCOPY    . JOINT REPLACEMENT Right 2016  . KNEE ARTHROSCOPY     rt x4  x1 lft  . MYRINGOPLASTY W/ FAT GRAFT     graft from behind ear  . SIGMOIDOSCOPY  10/13/05, 09/19/05  . TEE WITHOUT CARDIOVERSION N/A 06/15/2015   Procedure: TRANSESOPHAGEAL ECHOCARDIOGRAM (TEE);  Surgeon: Lindsay Bonilla;  Location: [facility];  Service: Open Heart Surgery;  Laterality: N/A;  . TONSILLECTOMY  2005  . TUBAL LIGATION    . TYMPANOSTOMY TUBE PLACEMENT       Current Outpatient Medications  Medication Sig Dispense Refill  . albuterol (PROVENTIL) (2.5 MG/3ML) 0.083% nebulizer solution Take 2.5 mg by nebulization every 6 (six) hours as needed for wheezing or shortness of breath.    Marland Kitchen albuterol (VENTOLIN HFA) 108 (90 BASE) MCG/ACT inhaler Inhale 1 puff into the lungs 4 (four) times daily as needed. For shortness of breath    . amoxicillin (AMOXIL) 500 MG capsule TAKE 4 CAPSULES 30 - 60 MINUTES PRIOR TO DENTAL APPOINTMENTS. 8 capsule 1  . aspirin EC 81 MG tablet Take 1 tablet (81 mg total) by mouth daily.    Marland Kitchen azelastine (ASTELIN) 0.1 % nasal spray Place 2 sprays into both nostrils 2 (two) times daily. Use in each nostril as directed    . budesonide-formoterol (SYMBICORT) 160-4.5 MCG/ACT inhaler Inhale 2 puffs into the lungs 2 (two) times daily.     . cetirizine (ZYRTEC) 10 MG tablet Take 10 mg by mouth every morning.     . diltiazem (TAZTIA XT) 360 MG 24 hr capsule Take 1 capsule (360 mg total) by mouth daily. 30 capsule 8  . EPINEPHrine (EPIPEN) 0.3 mg/0.3 mL IJ SOAJ injection Inject 0.3 mg into the muscle as needed. For allergic reaction    . Ferrous Gluconate (IRON 27 PO) Take 1 tablet by mouth daily.    . furosemide (LASIX) 80 MG tablet Take 1 tablet (80 mg total) by mouth 2 (two) times daily. 60 tablet 6  . gabapentin (NEURONTIN) 300  MG capsule Take 300 mg by mouth See admin instructions. Take 300 mg in morning and 600 at night    . losartan (COZAAR) 25 MG tablet TAKE 1 TABLET ONCE DAILY. 30 tablet 4  . metolazone (ZAROXOLYN) 2.5 MG tablet TAKE 1 TABLET BY MOUTH ON MONDAY, WEDNESDAY AND FRIDAY. 12 tablet 8  . montelukast (SINGULAIR) 10 MG tablet Take 10 mg by mouth at bedtime.     . naloxegol oxalate (MOVANTIK) 25 MG TABS tablet Take 25 mg by mouth daily.    . Olopatadine HCl (PATADAY) 0.2 % SOLN Place 1 drop into both eyes as needed (FOR ALLERGIES).     Marland Kitchen omeprazole (PRILOSEC) 20 MG capsule Take 40 mg by mouth 2 (two) times daily.     Marland Kitchen oxyCODONE (OXYCONTIN) 20 mg 12 hr tablet Take 20 mg by mouth every 12 (twelve) hours.     Marland Kitchen oxyCODONE-acetaminophen (PERCOCET) 10-325 MG tablet Take 1 tablet by mouth every 4 (four) hours as needed for pain.    . potassium chloride (K-DUR) 10 MEQ tablet Take 80 mEq by mouth 2 (two) times daily. 8 tabs twice per day    . pravastatin (PRAVACHOL) 40 MG tablet TAKE 1 TABLET IN THE EVENING. 30 tablet 11  . warfarin (COUMADIN) 5 MG tablet TAKE 1 TABLET ONCE A DAY OR AS DIRECTED BY COUMADIN CLINIC 35 tablet 3   No current facility-administered medications for this visit.     Allergies:   Beta adrenergic blockers; Doxycycline; Peanut-containing drug products; Shrimp [shellfish allergy]; Vancomycin; Other; Avelox [moxifloxacin hcl in nacl]; Ciprofloxacin; Cleocin [clindamycin hcl]; Moxifloxacin;  Sulfamethoxazole-trimethoprim; and Tape    Social History:  The patient  reports that she quit smoking about 3 years ago. Her smoking use included cigarettes. She has a 3.00 pack-year smoking history. She has never used smokeless tobacco. She reports that she does not drink alcohol or use drugs.   Family History:  The patient's family history includes Asthma in her brother; Cancer in her mother; Diabetes in her unknown relative; Heart attack in her father; Hyperlipidemia in her unknown relative.    ROS:  General:no colds or fevers, no weight changes Skin:no rashes or ulcers HEENT:no blurred vision, no congestion CV:see HPI PUL:see HPI GI:no diarrhea constipation or melena, no indigestion GU:no hematuria, no dysuria MS:no joint pain, no claudication Neuro:no syncope, no lightheadedness Endo:no diabetes, no thyroid disease  Wt Readings from Last 3 Encounters:  08/03/18 150 lb (68 kg)  12/13/17 148 lb (67.1 kg)  09/29/17 150 lb (68 kg)     PHYSICAL EXAM: VS:  BP (!) 132/58   Pulse (!) 48   Ht _0  (1.626 m)   Wt 150 lb (68 kg)   SpO2 97%   BMI 25.75 kg/m  , BMI Body mass index is 25.75 kg/m. General:Pleasant affect, NAD Skin:Warm and dry, brisk capillary refill HEENT:normocephalic, sclera clear, mucus membranes moist Neck:supple, no JVD, no bruits  Heart:S1S2 RRR without murmur, gallup, rub, loud click of valve heard to back neck and abd.   Lungs:clear though diminished breath sounds without rales, rhonchi, or wheezes KKX:FGHW, non tender, + BS, do not palpate liver spleen or masses Ext: tr lower ext edema, 2+ pedal pulses, 2+ radial pulses  Neuro:alert and oriented X 3, MAE, follows commands, + facial symmetry    EKG:  EKG is NOT ordered today.    Recent Labs: 09/30/2017: ALT 24; BUN 11; Creatinine, Ser 0.61; Hemoglobin 12.2; Magnesium 1.6; Platelets 200; Potassium 3.2; Sodium 139; TSH 0.367  Lipid Panel    Component Value Date/Time   CHOL 169 01/31/2018 1049    TRIG 168 (H) 01/31/2018 1049   HDL 58 01/31/2018 1049   CHOLHDL 2.9 01/31/2018 1049   CHOLHDL 2.7 10/22/2015 0933   VLDL 19 10/22/2015 0933   LDLCALC 77 01/31/2018 1049   LDLDIRECT 79.0 03/25/2014 0947       Other studies Reviewed: Additional studies/ records that were reviewed today include:  Echo 01/31/18  Study Conclusions  - Left ventricle: The cavity size was normal. Systolic function was   normal. The estimated ejection fraction was in the range of 60%   to 65%. Wall motion was normal; there were no regional wall   motion abnormalities. Left ventricular diastolic function   parameters were normal. - Aortic valve: S/P AVR with a 28 mm Hemashield St Jude mechanical   valve. Transvalvular velocity was within the normal range. There   was no stenosis. Mean gradient (S): 10 mm Hg. Peak gradient (S):   21 mm Hg. Valve area (VTI): 1.94 cm^2. Valve area (Vmax): 1.58   cm^2. Valve area (Vmean): 1.55 cm^2. - Mitral valve: Calcified annulus. There was mild regurgitation. - Right ventricle: Systolic function was normal. - Right atrium: The atrium was normal in size. - Tricuspid valve: There was mild regurgitation. - Pulmonic valve: There was no regurgitation. - Pulmonary arteries: The main pulmonary artery was normal-sized.   Systolic pressure was within the normal range. - Inferior vena cava: The vessel was normal in size. - Pericardium, extracardiac: There was no pericardial effusion.  Impressions:  - No significant difference from the prior study on 07/30/2015.   Transaortic gradients across the prosthetic valve are normal and   unchanged from prior.   ASSESSMENT AND PLAN:  1.  Edema bil lower est has had in past, will follow Dr. Alyse Low plan to increase lasix to 80 mg TID for 3 days and will increase Kdur by 40 mew extra for those 3 days.  Will check CMP, cbc, mg+, pro BNP  2.  DOE, dyspnea chec BNP and do CXR. 2.  Tachy brady but today in 40s, and with walking does  not increase.  Decrease dilt. to 300 mg  May be chrontropic issue 3.   AVR in 2017 with mechanical valve.  On coumadin and SBE prophylaxis.  Last echo was stable.  4.  Thoracic aortic aneurysm with s/p Bentall procedure in 2017 5.  Chronic diastolic HF.  See above for edema  6.  CAD with mild RCA plaque by cath 2017 on ASA and statin.  7.  Controlled HTN 8.  Anxiety but may be due to symptoms.     Will have her follow up in 2 weeks if increased symptoms she should go to ER.      Current medicines are reviewed with the patient today.  The patient Has no concerns regarding medicines.  The following changes have been made:  See above Labs/ tests ordered today include:see above  Disposition:   FU:  see above  Signed, Cecilie Kicks, NP  08/03/2018 9:15 AM    [address], [place], [place], [place] Phone: [phone]; Fax: [phone]

## 2018-08-03 NOTE — Telephone Encounter (Signed)
Enrolled patient for a 14 day Zio monitor to be mailed. Patients knows to expect the monitor to arrive in 3-4 days

## 2018-08-03 NOTE — Patient Instructions (Addendum)
Medication Instructions:  Your physician has recommended you make the following change in your medication:  1.  DECREASE the Diltiazem to 300 mg taking 1 tablet daily 2.  CONTINUE with the instructions from Dr. [name] by taking LASIX 80 mg taking 1 tablet three times a day for 3 days starting today   If you need a refill on your cardiac medications before your next appointment, please call your pharmacy.   Lab work: TODAY:  CMP, CBC, PRO BNP & MAGNESIUM  If you have labs (blood work) drawn today and your tests are completely normal, you will receive your results only by: Marland Kitchen MyChart Message (if you have MyChart) OR . A paper copy in the mail If you have any lab test that is abnormal or we need to change your treatment, we will call you to review the results.  Testing/Procedures:  TODAY:  WE WANT YOU TO HAVE A chest x-ray takes a picture of the organs and structures inside the chest, including the heart, lungs, and blood vessels. This test can show several things, including, whether the heart is enlarges; whether fluid is building up in the lungs; and whether pacemaker / defibrillator leads are still in place.   Lindsay Bonilla, [place] [address]  Your physician has recommended that you wear a heart monitor. Heart monitors are medical devices that record the heart's electrical activity. Doctors most often use these monitors to diagnose arrhythmias. Arrhythmias are problems with the speed or rhythm of the heartbeat. The monitor is a small, portable device. You can wear one while you do your normal daily activities. This is usually used to diagnose what is causing palpitations/syncope (passing out).    Follow-Up: At [facility], you and your health needs are our priority.  As part of our continuing mission to provide you with exceptional heart care, we have created designated Provider Care Teams.  These Care Teams include your primary Cardiologist  (physician) and Advanced Practice Providers (APPs -  Physician Assistants and Nurse Practitioners) who all work together to provide you with the care you need, when you need it. . You will need a follow up appointment in 2 WEEKS.  YOU ARE SCHEDULED FOR A VIRTUAL VISIT VIA VIDEO WITH Cecilie Kicks, NP, 08/15/2018 AT 3:30.  SOMEONE WILL CALL YOU APPROXIMATELY 3:15.  PLEASE HAVE YOUR VITAL SIGNS AND CURRENT MEDICATIONS WHEN THEY CALL.   SEE INFORMATION BELOW RE: VIRTUAL APPT.  YOUR CARDIOLOGY TEAM HAS ARRANGED FOR AN E-VISIT FOR YOUR APPOINTMENT - PLEASE REVIEW IMPORTANT INFORMATION BELOW SEVERAL DAYS PRIOR TO YOUR APPOINTMENT  Due to the recent COVID-19 pandemic, we are transitioning in-person office visits to tele-medicine visits in an effort to decrease unnecessary exposure to our patients, their families, and staff. These visits are billed to your insurance just like a normal visit is. We also encourage you to sign up for MyChart if you have not already done so. You will need a smartphone if possible. For patients that do not have this, we can still complete the visit using a regular telephone but do prefer a smartphone to enable video when possible. You may have a family member that lives with you that can help. If possible, we also ask that you have a blood pressure cuff and scale at home to measure your blood pressure, heart rate and weight prior to your scheduled appointment. Patients with clinical needs that need an in-person evaluation and testing will still be able to come to  the office if absolutely necessary. If you have any questions, feel free to call our office.     YOUR PROVIDER WILL BE USING THE FOLLOWING PLATFORM TO COMPLETE YOUR VISIT: Doximity  . IF USING DOXIMITY or [email or web address].ME - The staff will give you instructions on receiving your link to join the meeting the day of your visit.  Once you are finished giving your vital signs and going over your medications with the Assistant that calls  you, she will let the provider know you are ready.  The provider will then send a link to you as a text message.  You will open the message, click join meeting, check in by typing in 1st and last name, if it asks permission to use video/mic, make sure you click allow.  Once you have done these steps, it will put you in a "virtual waiting room" then once the provider sees you in there, she will join the meeting.   2-3 DAYS BEFORE YOUR APPOINTMENT  You will receive a telephone call from one of our [place] team members - your caller ID may say "Unknown caller." If this is a video visit, we will walk you through how to get the video launched on your phone. We will remind you check your blood pressure, heart rate and weight prior to your scheduled appointment. If you have an Apple Watch or Kardia, please upload any pertinent ECG strips the day before or morning of your appointment to [place]. Our staff will also make sure you have reviewed the consent and agree to move forward with your scheduled tele-health visit.     THE DAY OF YOUR APPOINTMENT  Approximately 15 minutes prior to your scheduled appointment, you will receive a telephone call from one of [place] team - your caller ID may say "Unknown caller."  Our staff will confirm medications, vital signs for the day and any symptoms you may be experiencing. Please have this information available prior to the time of visit start. It may also be helpful for you to have a pad of paper and pen handy for any instructions given during your visit. They will also walk you through joining the smartphone meeting if this is a video visit.    CONSENT FOR TELE-HEALTH VISIT - PLEASE REVIEW  I hereby voluntarily request, consent and authorize [facility] and its employed or contracted physicians, physician assistants, nurse practitioners or other licensed health care professionals (the Practitioner), to provide me with telemedicine health care services (the  "Services") as deemed necessary by the treating Practitioner. I acknowledge and consent to receive the Services by the Practitioner via telemedicine. I understand that the telemedicine visit will involve communicating with the Practitioner through live audiovisual communication technology and the disclosure of certain medical information by electronic transmission. I acknowledge that I have been given the opportunity to request an in-person assessment or other available alternative prior to the telemedicine visit and am voluntarily participating in the telemedicine visit.  I understand that I have the right to withhold or withdraw my consent to the use of telemedicine in the course of my care at any time, without affecting my right to future care or treatment, and that the Practitioner or I may terminate the telemedicine visit at any time. I understand that I have the right to inspect all information obtained and/or recorded in the course of the telemedicine visit and may receive copies of available information for a reasonable fee.  I understand that some of the potential risks of  receiving the Services via telemedicine include:  Marland Kitchen Delay or interruption in medical evaluation due to technological equipment failure or disruption; . Information transmitted may not be sufficient (e.g. poor resolution of images) to allow for appropriate medical decision making by the Practitioner; and/or  . In rare instances, security protocols could fail, causing a breach of personal health information.  Furthermore, I acknowledge that it is my responsibility to provide information about my medical history, conditions and care that is complete and accurate to the best of my ability. I acknowledge that Practitioner's advice, recommendations, and/or decision may be based on factors not within their control, such as incomplete or inaccurate data provided by me or distortions of diagnostic images or specimens that may result from  electronic transmissions. I understand that the practice of medicine is not an exact science and that Practitioner makes no warranties or guarantees regarding treatment outcomes. I acknowledge that I will receive a copy of this consent concurrently upon execution via email to the email address I last provided but may also request a printed copy by calling the office of [place].    I understand that my insurance will be billed for this visit.   I have read or had this consent read to me. . I understand the contents of this consent, which adequately explains the benefits and risks of the Services being provided via telemedicine.  . I have been provided ample opportunity to ask questions regarding this consent and the Services and have had my questions answered to my satisfaction. . I give my informed consent for the services to be provided through the use of telemedicine in my medical care  By participating in this telemedicine visit I agree to the above.      Virtual Visit Pre-Appointment Phone Call  "(Name), I am calling you today to discuss your upcoming appointment. We are currently trying to limit exposure to the virus that causes COVID-19 by seeing patients at home rather than in the office."  1. "What is the BEST phone number to call the day of the visit?" - include this in appointment notes  2. "Do you have or have access to (through a family member/friend) a smartphone with video capability that we can use for your visit?" a. If yes - list this number in appt notes as "cell" (if different from BEST phone #) and list the appointment type as a VIDEO visit in appointment notes b. If no - list the appointment type as a PHONE visit in appointment notes  3. Confirm consent - "In the setting of the current Covid19 crisis, you are scheduled for a (phone or video) visit with your provider on (date) at (time).  Just as we do with many in-office visits, in order for you to participate in  this visit, we must obtain consent.  If you'd like, I can send this to your mychart (if signed up) or email for you to review.  Otherwise, I can obtain your verbal consent now.  All virtual visits are billed to your insurance company just like a normal visit would be.  By agreeing to a virtual visit, we'd like you to understand that the technology does not allow for your provider to perform an examination, and thus may limit your provider's ability to fully assess your condition. If your provider identifies any concerns that need to be evaluated in person, we will make arrangements to do so.  Finally, though the technology is pretty good, we cannot assure that  it will always work on either your or our end, and in the setting of a video visit, we may have to convert it to a phone-only visit.  In either situation, we cannot ensure that we have a secure connection.  Are you willing to proceed?" STAFF: Did the patient verbally acknowledge consent to telehealth visit? Document YES/NO here: YES  4. Advise patient to be prepared - "Two hours prior to your appointment, go ahead and check your blood pressure, pulse, oxygen saturation, and your weight (if you have the equipment to check those) and write them all down. When your visit starts, your provider will ask you for this information. If you have an Apple Watch or Kardia device, please plan to have heart rate information ready on the day of your appointment. Please have a pen and paper handy nearby the day of the visit as well."  5. Give patient instructions for MyChart download to smartphone OR Doximity/Doxy.me as below if video visit (depending on what platform provider is using)  6. Inform patient they will receive a phone call 15 minutes prior to their appointment time (may be from unknown caller ID) so they should be prepared to answer    TELEPHONE CALL NOTE  Lindsay Bonilla has been deemed a candidate for a follow-up tele-health visit to limit community  exposure during the Covid-19 pandemic. I spoke with the patient via phone to ensure availability of phone/video source, confirm preferred email & phone number, and discuss instructions and expectations.  I reminded Lindsay Bonilla to be prepared with any vital sign and/or heart rhythm information that could potentially be obtained via home monitoring, at the time of her visit. I reminded Lindsay Bonilla to expect a phone call prior to her visit.  Lindsay Bonilla, [place] 08/03/2018 9:46 AM   INSTRUCTIONS FOR DOWNLOADING THE MYCHART APP TO SMARTPHONE  - The patient must first make sure to have activated MyChart and know their login information - If Apple, go to [employer] and type in MyChart in the search bar and download the app. If Android, ask patient to go to [employer] and type in [place] in the search bar and download the app. The app is free but as with any other app downloads, their phone may require them to verify saved payment information or Apple/Android password.  - The patient will need to then log into the app with their MyChart username and password, and select [place] as their healthcare provider to link the account. When it is time for your visit, go to the MyChart app, find appointments, and click Begin Video Visit. Be sure to Select Allow for your device to access the Microphone and Camera for your visit. You will then be connected, and your provider will be with you shortly.  **If they have any issues connecting, or need assistance please contact MyChart service desk [phone])**  **If using a computer, in order to ensure the best quality for their visit they will need to use either of the following Internet Browsers: [employer], or Google Chrome**  IF USING DOXIMITY or DOXY.ME - The patient will receive a link just prior to their visit by text.     FULL LENGTH CONSENT FOR TELE-HEALTH VISIT   I hereby voluntarily request, consent and authorize [place] and its employed or contracted physicians, physician assistants, nurse practitioners or other licensed health care professionals (the Practitioner), to provide me with telemedicine health care services (the "Services") as deemed necessary by  the treating Practitioner. I acknowledge and consent to receive the Services by the Practitioner via telemedicine. I understand that the telemedicine visit will involve communicating with the Practitioner through live audiovisual communication technology and the disclosure of certain medical information by electronic transmission. I acknowledge that I have been given the opportunity to request an in-person assessment or other available alternative prior to the telemedicine visit and am voluntarily participating in the telemedicine visit.  I understand that I have the right to withhold or withdraw my consent to the use of telemedicine in the course of my care at any time, without affecting my right to future care or treatment, and that the Practitioner or I may terminate the telemedicine visit at any time. I understand that I have the right to inspect all information obtained and/or recorded in the course of the telemedicine visit and may receive copies of available information for a reasonable fee.  I understand that some of the potential risks of receiving the Services via telemedicine include:  Marland Kitchen Delay or interruption in medical evaluation due to technological equipment failure or disruption; . Information transmitted may not be sufficient (e.g. poor resolution of images) to allow for appropriate medical decision making by the Practitioner; and/or  . In rare instances, security protocols could fail, causing a breach of personal health information.  Furthermore, I acknowledge that it is my responsibility to provide information about my medical history, conditions and care that is complete and accurate to the best of my ability. I acknowledge that Practitioner's  advice, recommendations, and/or decision may be based on factors not within their control, such as incomplete or inaccurate data provided by me or distortions of diagnostic images or specimens that may result from electronic transmissions. I understand that the practice of medicine is not an exact science and that Practitioner makes no warranties or guarantees regarding treatment outcomes. I acknowledge that I will receive a copy of this consent concurrently upon execution via email to the email address I last provided but may also request a printed copy by calling the office of [place].    I understand that my insurance will be billed for this visit.   I have read or had this consent read to me. . I understand the contents of this consent, which adequately explains the benefits and risks of the Services being provided via telemedicine.  . I have been provided ample opportunity to ask questions regarding this consent and the Services and have had my questions answered to my satisfaction. . I give my informed consent for the services to be provided through the use of telemedicine in my medical care  By participating in this telemedicine visit I agree to the above.  Any Other Special Instructions Will Be Listed Below (If Applicable).

## 2018-08-06 ENCOUNTER — Telehealth: Payer: Self-pay | Admitting: *Deleted

## 2018-08-06 DIAGNOSIS — R0602 Shortness of breath: Secondary | ICD-10-CM

## 2018-08-06 MED ORDER — MAGNESIUM OXIDE 400 MG PO TABS: ORAL_TABLET | ORAL | 0 refills | Status: DC

## 2018-08-06 NOTE — Telephone Encounter (Signed)
-----   Message from Lindsay Serge, NP sent at 08/06/2018  2:39 PM EDT ----- Pt needs CT of chest without contrast of possible nodule on CXR. This week please.  I have explained to pt that we are not sure if there is really anything present but we need to find out.  Also if that monitor could get to her soon.  Thanks.

## 2018-08-06 NOTE — Telephone Encounter (Signed)
-----   Message from Isaiah Serge, NP sent at 08/04/2018  9:27 PM EDT ----- Labs are stable.  Her Mg+ is on lower end add magnesium oxide 400 mg daily for 2 weeks.

## 2018-08-06 NOTE — Telephone Encounter (Signed)
Patient states, she did not receive a call 08/03/2018 regarding ZIO patch monitor.  She said someone called but did not leave message.  Duration of no message was 4 seconds. I informed patient a ZIO XT long term monitor was mailed to her home on Friday, 08/03/18.  I reviewed instructions briefly as they are included in the monitor kit.

## 2018-08-06 NOTE — Telephone Encounter (Signed)

## 2018-08-06 NOTE — Telephone Encounter (Signed)
Order for CT has been placed.  Waiting on CT Dept , Erline Levine, to let me know when they can schedule the pt.

## 2018-08-07 ENCOUNTER — Other Ambulatory Visit: Payer: Self-pay

## 2018-08-07 ENCOUNTER — Ambulatory Visit (HOSPITAL_COMMUNITY)
Admission: RE | Admit: 2018-08-07 | Discharge: 2018-08-07 | Disposition: A | Discharge status: Home / Self Care | Payer: Medicaid Other | Source: Ambulatory Visit | Attending: Pulmonary Disease | Admitting: Pulmonary Disease

## 2018-08-07 ENCOUNTER — Inpatient Hospital Stay: Admission: RE | Admit: 2018-08-07 | Payer: Medicaid Other | Source: Ambulatory Visit

## 2018-08-07 DIAGNOSIS — M25511 Pain in right shoulder: Secondary | ICD-10-CM | POA: Diagnosis present

## 2018-08-08 ENCOUNTER — Telehealth: Payer: Self-pay | Admitting: *Deleted

## 2018-08-08 NOTE — Telephone Encounter (Signed)
Called patient left message to call back to schedule CT

## 2018-08-08 NOTE — Telephone Encounter (Signed)
Confirmed appt for CT on Friday 6/12

## 2018-08-10 ENCOUNTER — Telehealth: Payer: Self-pay | Admitting: Radiology

## 2018-08-10 ENCOUNTER — Ambulatory Visit (INDEPENDENT_AMBULATORY_CARE_PROVIDER_SITE_OTHER): Payer: Medicaid Other

## 2018-08-10 ENCOUNTER — Other Ambulatory Visit: Payer: Self-pay

## 2018-08-10 ENCOUNTER — Ambulatory Visit (INDEPENDENT_AMBULATORY_CARE_PROVIDER_SITE_OTHER)
Admission: RE | Admit: 2018-08-10 | Discharge: 2018-08-10 | Disposition: A | Discharge status: Home / Self Care | Payer: Medicaid Other | Source: Ambulatory Visit | Attending: Cardiology | Admitting: Cardiology

## 2018-08-10 DIAGNOSIS — R0602 Shortness of breath: Secondary | ICD-10-CM | POA: Diagnosis not present

## 2018-08-10 DIAGNOSIS — I495 Sick sinus syndrome: Secondary | ICD-10-CM | POA: Diagnosis not present

## 2018-08-10 NOTE — Telephone Encounter (Signed)
I was asked to call patient who needed help with her Zio monitor. I asked if she had opened up and referred to the directions yet. (Zio monitors come with a step by step kit on how to apply).  I could tell she was upset and offered her to come in late this afternoon. I then calmly tried to go over the steps with her....step 1 there is a razor she would need to shave her chest... step 2 she would use the scrub pad and do 40 stokes to get all the dead skin off. Step 3 apply alcohol to the area. It will burn but it is a needed step to get dead skin off. I couldn't get through all the steps because patient continued to yell and cuss at me. I tried multiple times to calm her down and slowly go over the steps to apply the monitor. She was very agitated and ended up hanging up on me.

## 2018-08-14 ENCOUNTER — Other Ambulatory Visit: Payer: Self-pay | Admitting: Physician Assistant

## 2018-08-15 ENCOUNTER — Encounter: Payer: Self-pay | Admitting: Cardiology

## 2018-08-15 ENCOUNTER — Telehealth (INDEPENDENT_AMBULATORY_CARE_PROVIDER_SITE_OTHER): Payer: Medicaid Other | Admitting: Cardiology

## 2018-08-15 ENCOUNTER — Other Ambulatory Visit: Payer: Self-pay

## 2018-08-15 VITALS — BP 116/73 | HR 67 | Ht 64.0 in | Wt 150.0 lb

## 2018-08-15 DIAGNOSIS — I110 Hypertensive heart disease with heart failure: Secondary | ICD-10-CM | POA: Diagnosis not present

## 2018-08-15 DIAGNOSIS — I10 Essential (primary) hypertension: Secondary | ICD-10-CM

## 2018-08-15 DIAGNOSIS — I712 Thoracic aortic aneurysm, without rupture, unspecified: Secondary | ICD-10-CM

## 2018-08-15 DIAGNOSIS — R001 Bradycardia, unspecified: Secondary | ICD-10-CM

## 2018-08-15 DIAGNOSIS — Z952 Presence of prosthetic heart valve: Secondary | ICD-10-CM

## 2018-08-15 DIAGNOSIS — I5032 Chronic diastolic (congestive) heart failure: Secondary | ICD-10-CM

## 2018-08-15 MED ORDER — FUROSEMIDE 80 MG PO TABS: 80.0000 mg | ORAL_TABLET | Freq: Two times a day (BID) | ORAL | 6 refills | Status: DC

## 2018-08-15 MED ORDER — POTASSIUM CHLORIDE CRYS ER 10 MEQ PO TBCR: 80.0000 meq | EXTENDED_RELEASE_TABLET | Freq: Two times a day (BID) | ORAL | 11 refills | Status: DC

## 2018-08-15 NOTE — Progress Notes (Signed)
Virtual Visit via Video Note   This visit type was conducted due to national recommendations for restrictions regarding the COVID-19 Pandemic (e.g. social distancing) in an effort to limit this patient's exposure and mitigate transmission in our community.  Due to her co-morbid illnesses, this patient is at least at moderate risk for complications without adequate follow up.  This format is felt to be most appropriate for this patient at this time.  All issues noted in this document were discussed and addressed.  A limited physical exam was performed with this format.  Please refer to the patient's chart for her consent to telehealth for [facility].   Date:  08/15/2018   ID:  Lindsay Bonilla, DOB 1960-07-19, MRN [id number]  Patient Location: Home Provider Location: Office  PCP:  Lindsay Du, MD  Cardiologist:  Lindsay Chandler, MD  Electrophysiologist:  None   Evaluation Performed:  Follow-Up Visit  Chief Complaint:  Slow heart rate.  History of Present Illness:    Lindsay Bonilla is a 58 y.o. female with OV on the 5th  leg edema, very bad, fast and slow HR, at times very slow HR.  Increased DOE she also stated Dr. Luan Bonilla had wanted her to be seen by cardiology.  .    She has a history of bicuspid aortic valve s/p Ross procedure in 1998 with thoracic aortic aneurysm and thenBentall procedure in April 2017, COPD, GERD, anxiety, chronic diastolic CHF, tobacco abuse, PVCs, PACs here today forcardiac follow up. She has been followed for aortic valve insufficiency and thoracic aortic aneurysm and underwent Bentall procedure per Dr. Cyndia Bonilla on 06/15/15. Cardiac cath March 2017 with mild CAD (20% RCA stenosis). Echo June 2017 with normally functioning mechanical aortic valve replacement, normal LV systolic function. Cardiac monitor June 2017 with PACs, PVCs. She has chronic diastolic CHF and is on Lasix. Normal ABI December 2018. Venous dopplers August 2019 with no evidence of DVT.  Volume overload when seen in our office in October 2018. Metolazone added 3 times per week to her Lasix and her volume overload improved.   On last visit 12/13/17.  She thinks her valve is not clicking as loud lately. Lots of stress at home with illness of her brother. She had not been getting much sleep.  Echo 01/31/18 with normal LV function EF 60-65%  and AVR was working well and mild MR. Stable.   08/03/18 she complains of lower ext edema, lasix is increased to 80 mg TID for 3 days and continue metolazone    Last INR 2.2  Our office follows. Her lower ext edema is not bad this AM but by late day she can hardly walk.  So much swelling has to loosen her shoes and has to take off support stockings.  She is being treated for cellulitis with amoxicillin by Dr. Luan Bonilla.   No colds or fevers or cough.  Wears mask when out.   She notes her HR is pounding at times and other times very slow and she feels it will stop, this is increased since she feels the click of her heart so strongly.  She has DOE can hardly get around at times, she continues to wear her oxygen 24/7.  Has not been eating salt.   Her HR today is 48 and with walking stayed at 46-48.  Her sp02 with walking was 97%.  She has had recent echo and stable.    I did CXR revealing hyperinflated hyperlucent lungs consistent with emphysema, and ill defined density  overlying there lower thoracic spin on lateral view may represent prominent osteophytes but concern for mass.  CT of chest was done with no mass + osteophytes, severe emphysema, and diffuse bilateral bronchial wall thickening Coronary artery disease and postoperative findings of aortic valve and root graft repair, with probable reconstruction of the pulmonary outflow tract  Labs with Pro BNP of 494, Mg+ was lower end and mg+ added for 2 weeks.  And CBC and CMP were normal.  I did call her back and pulse still low so she was holding her diltiazem at times still felt bad. Monitor is not  yet back.  .    Today she is feeling much better, her breathing is better and she has not had so many episodes of slow HR.  Her last dose of dilt was 08/03/18.  She rec'd the event monitor 08/10/18.  She does believe her last rate of 30 was on the 12th now only in 40s at times.  She checks with pulse ox.   Her BP is good today off the dilt and SOB has improved.    She has had MRI of shoulder and this shows bursitis and other abnormalities to see Dr. Luan Bonilla back.   The patient does not have symptoms concerning for COVID-19 infection (fever, chills, cough, or new shortness of breath).    Past Medical History:  Diagnosis Date   Anxiety    Aortic aneurysm (HCC)    Aortic insufficiency    a. s/p Bentall with mechanical AVR 4/17 >> FU Echo 6/17: Mild LVH, EF 60-65%, normal wall motion, ventricular septum with paradoxical septal motion, St. 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   Migraine headache    Palpitations    PONV (postoperative nausea and vomiting)    "only once"   Shortness of breath    Nodule left lung CT done 8/6   Stress incontinence    Urinary frequency    due to Lasix   Valvular heart disease    Past Surgical History:  Procedure Laterality Date   ABDOMINAL HYSTERECTOMY     ANTERIOR CERVICAL DECOMP/DISCECTOMY FUSION  10/05/2011   Procedure: ANTERIOR CERVICAL DECOMPRESSION/DISCECTOMY FUSION 2 LEVELS;  Surgeon: Lindsay Stakes, MD;  Location: [facility];   Service: Neurosurgery;  Laterality: N/A;  Cervical five - six , six - seven Anterior cervical decompression/diskectomy/fusion/plate   ANTERIOR CRUCIATE LIGAMENT REPAIR Right    AORTIC VALVE REPLACEMENT     ARTHROSCOPIC REPAIR ACL  rt   BENTALL PROCEDURE N/A 06/15/2015   Procedure: BENTALL PROCEDURE; HEMI-ARCH REPAIR WITH #23 ST JUDE MECHANICAL AVR CONDUIT AND #28 HEMASHIELD PLATINUM GRAFT;  Surgeon: Gaye Pollack, MD;  Location: [facility];  Service: Open Heart Surgery;  Laterality: N/A;   CARDIAC CATHETERIZATION N/A 05/21/2015   Procedure: Right/Left Heart Cath and Coronary Angiography;  Surgeon: [name], MD;  Location: [facility];  Service: Cardiovascular;  Laterality: N/A;   CHOLECYSTECTOMY     COLONOSCOPY  10/13/05   DIAGNOSTIC LAPAROSCOPY     JOINT REPLACEMENT Right 2016   KNEE ARTHROSCOPY     rt x4  x1 lft   MYRINGOPLASTY W/ FAT GRAFT     graft from behind ear   SIGMOIDOSCOPY  10/13/05, 09/19/05   TEE WITHOUT CARDIOVERSION N/A 06/15/2015   Procedure: TRANSESOPHAGEAL ECHOCARDIOGRAM (TEE);  Surgeon: Gaye Pollack, MD;  Location: [facility];  Service: Open Heart Surgery;  Laterality: N/A;   TONSILLECTOMY  2005   TUBAL LIGATION     TYMPANOSTOMY TUBE PLACEMENT       Current Meds  Medication Sig   albuterol (PROVENTIL) (2.5 MG/3ML) 0.083% nebulizer solution Take 2.5 mg by nebulization every 6 (six) hours as needed for wheezing or shortness of breath.   albuterol (VENTOLIN HFA) 108 (90 BASE) MCG/ACT inhaler Inhale 1 puff into the lungs 4 (four) times daily as needed. For shortness of breath   amoxicillin (AMOXIL) 500 MG capsule TAKE 4 CAPSULES 30 - 60 MINUTES PRIOR TO DENTAL APPOINTMENTS.   aspirin EC 81 MG tablet Take 1 tablet (81 mg total) by mouth daily.   azelastine (ASTELIN) 0.1 % nasal spray Place 2 sprays into both nostrils 2 (two) times daily. Use in each nostril as directed   budesonide-formoterol (SYMBICORT) 160-4.5 MCG/ACT inhaler Inhale 2 puffs  into the lungs 2 (two) times daily.   cetirizine (ZYRTEC) 10 MG tablet Take 10 mg by mouth every morning.    diltiazem (TAZTIA XT) 300 MG 24 hr capsule Take 1 capsule (300 mg total) by mouth daily.   EPINEPHrine (EPIPEN) 0.3 mg/0.3 mL IJ SOAJ injection Inject 0.3 mg into the muscle as needed. For allergic reaction   Ferrous Gluconate (IRON 27 PO) Take 1 tablet by mouth daily.   furosemide (LASIX) 80 MG tablet Take 1 tablet (80 mg total) by mouth 2 (two) times daily.   gabapentin (NEURONTIN) 300 MG capsule Take 300 mg by mouth See admin instructions. Take 300 mg in morning and 600 at night   losartan (COZAAR) 25 MG tablet TAKE 1 TABLET ONCE DAILY.   magnesium oxide (MAG-OX) 400 MG tablet Take as directed   metolazone (ZAROXOLYN) 2.5 MG tablet TAKE 1 TABLET BY MOUTH ON MONDAY, WEDNESDAY AND FRIDAY.   montelukast (SINGULAIR) 10 MG tablet Take 10 mg by mouth at bedtime.    naloxegol oxalate (MOVANTIK) 25 MG TABS tablet Take 25 mg by mouth daily.   Olopatadine HCl (PATADAY) 0.2 % SOLN Place 1 drop into both eyes as needed (FOR ALLERGIES).    omeprazole (PRILOSEC) 20 MG capsule Take 40 mg by mouth 2 (two) times daily.    oxyCODONE (OXYCONTIN) 20 mg 12 hr tablet Take 20 mg by mouth every 12 (twelve) hours.    oxyCODONE-acetaminophen (PERCOCET) 10-325 MG tablet Take 1 tablet by mouth every 4 (four) hours as needed for pain.   potassium chloride (K-DUR) 10 MEQ tablet Take 80 mEq by mouth 2 (two) times daily. 8 tabs twice per day   pravastatin (PRAVACHOL) 40 MG tablet TAKE 1 TABLET IN THE EVENING.   warfarin (COUMADIN) 5 MG tablet TAKE 1 TABLET ONCE A DAY OR AS DIRECTED BY COUMADIN CLINIC     Allergies:   Beta adrenergic blockers, Doxycycline, Peanut-containing drug products, Shrimp [shellfish allergy], Vancomycin, Other, Avelox [moxifloxacin hcl in nacl], Ciprofloxacin, Cleocin [clindamycin hcl], Moxifloxacin, Sulfamethoxazole-trimethoprim, and Tape   Social History   Tobacco Use     Smoking status: Former  Smoker    Packs/day: 0.10    Years: 30.00    Pack years: 3.00    Types: Cigarettes    Quit date: 04/29/2015    Years since quitting: 3.2   Smokeless tobacco: Never Used  Substance Use Topics   Alcohol use: No    Alcohol/week: 0.0 standard drinks   Drug use: No     Family Hx: The patient's family history includes Asthma in her brother; Cancer in her mother; Diabetes in an other family member; Heart attack in her father; Hyperlipidemia in an other family member.  ROS:   Please see the history of present illness.    General:no colds or fevers, no weight changes Skin:no rashes or ulcers HEENT:no blurred vision, no congestion CV:see HPI PUL:see HPI GI:no diarrhea constipation or melena, no indigestion GU:no hematuria, no dysuria MS:no joint pain, no claudication Neuro:no syncope, no lightheadedness Endo:no diabetes, no thyroid disease  All other systems reviewed and are negative.   Prior CV studies:   The following studies were reviewed today:   Echo 01/31/18  Study Conclusions  - Left ventricle: The cavity size was normal. Systolic function was normal. The estimated ejection fraction was in the range of 60% to 65%. Wall motion was normal; there were no regional wall motion abnormalities. Left ventricular diastolic function parameters were normal. - Aortic valve: S/P AVR with a 28 mm Hemashield St Jude mechanical valve. Transvalvular velocity was within the normal range. There was no stenosis. Mean gradient (S): 10 mm Hg. Peak gradient (S): 21 mm Hg. Valve area (VTI): 1.94 cm^2. Valve area (Vmax): 1.58 cm^2. Valve area (Vmean): 1.55 cm^2. - Mitral valve: Calcified annulus. There was mild regurgitation. - Right ventricle: Systolic function was normal. - Right atrium: The atrium was normal in size. - Tricuspid valve: There was mild regurgitation. - Pulmonic valve: There was no regurgitation. - Pulmonary arteries: The main  pulmonary artery was normal-sized. Systolic pressure was within the normal range. - Inferior vena cava: The vessel was normal in size. - Pericardium, extracardiac: There was no pericardial effusion.  Impressions:  - No significant difference from the prior study on 07/30/2015. Transaortic gradients across the prosthetic valve are normal and unchanged from prior  Labs/Other Tests and Data Reviewed:    EKG:  No ECG reviewed.  Recent Labs: 09/30/2017: TSH 0.367 08/03/2018: ALT 19; BUN 12; Creatinine, Ser 0.94; Hemoglobin 14.0; Magnesium 1.6; NT-Pro BNP 494; Platelets 322; Potassium 4.1; Sodium 141   Recent Lipid Panel Lab Results  Component Value Date/Time   CHOL 169 01/31/2018 10:49 AM   TRIG 168 (H) 01/31/2018 10:49 AM   HDL 58 01/31/2018 10:49 AM   CHOLHDL 2.9 01/31/2018 10:49 AM   CHOLHDL 2.7 10/22/2015 09:33 AM   LDLCALC 77 01/31/2018 10:49 AM   LDLDIRECT 79.0 03/25/2014 09:47 AM    Wt Readings from Last 3 Encounters:  08/15/18 150 lb (68 kg)  08/03/18 150 lb (68 kg)  12/13/17 148 lb (67.1 kg)     Objective:    Vital Signs:  BP 116/73    Pulse 67    Ht _0  (1.626 m)    Wt 150 lb (68 kg)    BMI 25.75 kg/m    VITAL SIGNS:  reviewed  General female in NAD with no SOB  Neuro A&O X 3 MAE,  Lungs oxygen in place, can speak in complete sentences with no SOB Affect pleasant and improved for OV.   ASSESSMENT & PLAN:    1. Symptomatic SB with  HR at least to 44 in office with walking and at home down to 30s, I decreased dilt but she actually stopped which is great.  HR today 67.  She is wearing monitor - this will let us know how low she may have been and how improved  Briefly discussed PPM but hopefully will not need 2. CHF  Chronic diastolic with SOB and edema on last visit now improved.   3. AVR in 2017 with mechanical valve.  On coumadin and SBE  4. Thoracic aortic aneurysm with S/p Bentall procedure 2017 stable 5. htn controlled.   6. hypomagnesium  Recheck on  next ov.       COVID-19 Education: The signs and symptoms of COVID-19 were discussed with the patient and how to seek care for testing (follow up with PCP or arrange E-visit).  The importance of social distancing was discussed today.  Time:   Today, I have spent 15 minutes with the patient with telehealth technology discussing the above problems.     Medication Adjustments/Labs and Tests Ordered: Current medicines are reviewed at length with the patient today.  Concerns regarding medicines are outlined above.   Tests Ordered: No orders of the defined types were placed in this encounter.   Medication Changes: No orders of the defined types were placed in this encounter.   Follow Up:  In Person in 2 week(s)  Signed, Cecilie Kicks, NP  08/15/2018 3:33 PM    [facility]

## 2018-08-15 NOTE — Patient Instructions (Signed)
Medication Instructions:  CONTINUE TO STAY OFF THE DILTIAZEM  If you need a refill on your cardiac medications before your next appointment, please call your pharmacy.   Lab work: None ordered  If you have labs (blood work) drawn today and your tests are completely normal, you will receive your results only by: Marland Kitchen MyChart Message (if you have MyChart) OR . A paper copy in the mail If you have any lab test that is abnormal or we need to change your treatment, we will call you to review the results.  Testing/Procedures: None ordered  Follow-Up: At [facility], you and your health needs are our priority.  As part of our continuing mission to provide you with exceptional heart care, we have created designated Provider Care Teams.  These Care Teams include your primary Cardiologist (physician) and Advanced Practice Providers (APPs -  Physician Assistants and Nurse Practitioners) who all work together to provide you with the care you need, when you need it. You will need a follow up appointment in 4 weeks You have been scheduled an in-office visit with Lindsay Kicks, NP, 09/20/2018 at 1:30.  Arrive 15 mis before your appointment and bring a mask with you, if you don't have one, we will provide you with one. If this appointment don't work for you, please call our office to reschedule.   Other Special Instructions Will Be Listed Below (If Applicable).

## 2018-08-22 ENCOUNTER — Ambulatory Visit (INDEPENDENT_AMBULATORY_CARE_PROVIDER_SITE_OTHER): Payer: Medicaid Other | Admitting: *Deleted

## 2018-08-22 DIAGNOSIS — Z5181 Encounter for therapeutic drug level monitoring: Secondary | ICD-10-CM

## 2018-08-22 DIAGNOSIS — I4892 Unspecified atrial flutter: Secondary | ICD-10-CM

## 2018-08-22 DIAGNOSIS — Z952 Presence of prosthetic heart valve: Secondary | ICD-10-CM

## 2018-08-22 LAB — POCT INR: INR: 2.3 (ref 2.0–3.0)

## 2018-08-22 NOTE — Patient Instructions (Signed)
Continue with 1 tablet daily.  Recheck in 4 weeks

## 2018-09-04 ENCOUNTER — Other Ambulatory Visit: Payer: Self-pay

## 2018-09-07 ENCOUNTER — Telehealth: Payer: Self-pay | Admitting: *Deleted

## 2018-09-07 DIAGNOSIS — I495 Sick sinus syndrome: Secondary | ICD-10-CM

## 2018-09-07 NOTE — Telephone Encounter (Signed)
I don't have any good ideas for her cramps. I think a virtual visit would be appropriate.

## 2018-09-07 NOTE — Telephone Encounter (Signed)
-----   Message from [name], MD sent at 09/06/2018  9:46 AM EDT ----- Lowest HR is 49. Several runs of atrial tachycardiac or SVT but very short. Nothing to change. Can we let her know this all benign? Thanks, chris

## 2018-09-07 NOTE — Telephone Encounter (Signed)
Appt with Cecilie Kicks, NP on July 23,2020--video I spoke with pt and gave her information from Dr. [name].  Appt changed to virtual visit.   Virtual Visit Pre-Appointment Phone Call  "(Name), I am calling you today to discuss your upcoming appointment. We are currently trying to limit exposure to the virus that causes COVID-19 by seeing patients at home rather than in the office."  1. "What is the BEST phone number to call the day of the visit?" - include this in appointment notes [phone]  2. "Do you have or have access to (through a family member/friend) a smartphone with video capability that we can use for your visit?" yes a. If yes - list this number in appt notes as "cell" (if different from BEST phone #) and list the appointment type as a VIDEO visit in appointment notes b. If no - list the appointment type as a PHONE visit in appointment notes  3. Confirm consent - "In the setting of the current Covid19 crisis, you are scheduled for a video visit with your provider on July 23,2020 at 1:30.  Just as we do with many in-office visits, in order for you to participate in this visit, we must obtain consent.  If you'd like, I can send this to your mychart (if signed up) or email for you to review.  Otherwise, I can obtain your verbal consent now.  All virtual visits are billed to your insurance company just like a normal visit would be.  By agreeing to a virtual visit, we'd like you to understand that the technology does not allow for your provider to perform an examination, and thus may limit your provider's ability to fully assess your condition. If your provider identifies any concerns that need to be evaluated in person, we will make arrangements to do so.  Finally, though the technology is pretty good, we cannot assure that it will always work on either your or our end, and in the setting of a video visit, we may have to convert it to a phone-only visit.  In either situation, we cannot  ensure that we have a secure connection.  Are you willing to proceed?" STAFF: Did the patient verbally acknowledge consent to telehealth visit? Document YES/NO here: yes  4. Advise patient to be prepared - "Two hours prior to your appointment, go ahead and check your blood pressure, pulse, oxygen saturation, and your weight (if you have the equipment to check those) and write them all down. When your visit starts, your provider will ask you for this information. If you have an Apple Watch or Kardia device, please plan to have heart rate information ready on the day of your appointment. Please have a pen and paper handy nearby the day of the visit as well."  5. Give patient instructions for MyChart download to smartphone OR Doximity/Doxy.me as below if video visit (depending on what platform provider is using)  6. Inform patient they will receive a phone call 15 minutes prior to their appointment time (may be from unknown caller ID) so they should be prepared to answer    TELEPHONE CALL NOTE  Lindsay Bonilla has been deemed a candidate for a follow-up tele-health visit to limit community exposure during the Covid-19 pandemic. I spoke with the patient via phone to ensure availability of phone/video source, confirm preferred email & phone number, and discuss instructions and expectations.  I reminded Lindsay Bonilla to be prepared with any vital sign and/or heart rhythm information that  could potentially be obtained via home monitoring, at the time of her visit. I reminded Lindsay Bonilla to expect a phone call prior to her visit.  Leodis Liverpool, RN 09/07/2018 1:53 PM   INSTRUCTIONS FOR DOWNLOADING THE MYCHART APP TO SMARTPHONE  - The patient must first make sure to have activated MyChart and know their login information - If Apple, go to [employer] and type in MyChart in the search bar and download the app. If Android, ask patient to go to [employer] and type in [place] in the search bar and  download the app. The app is free but as with any other app downloads, their phone may require them to verify saved payment information or Apple/Android password.  - The patient will need to then log into the app with their MyChart username and password, and select [facility] as their healthcare provider to link the account. When it is time for your visit, go to the MyChart app, find appointments, and click Begin Video Visit. Be sure to Select Allow for your device to access the Microphone and Camera for your visit. You will then be connected, and your provider will be with you shortly.  **If they have any issues connecting, or need assistance please contact MyChart service desk [phone])**  **If using a computer, in order to ensure the best quality for their visit they will need to use either of the following Internet Browsers: [employer], or Google Chrome**  IF USING DOXIMITY or DOXY.ME - The patient will receive a link just prior to their visit by text.     FULL LENGTH CONSENT FOR TELE-HEALTH VISIT   I hereby voluntarily request, consent and authorize [facility] and its employed or contracted physicians, physician assistants, nurse practitioners or other licensed health care professionals (the Practitioner), to provide me with telemedicine health care services (the "Services") as deemed necessary by the treating Practitioner. I acknowledge and consent to receive the Services by the Practitioner via telemedicine. I understand that the telemedicine visit will involve communicating with the Practitioner through live audiovisual communication technology and the disclosure of certain medical information by electronic transmission. I acknowledge that I have been given the opportunity to request an in-person assessment or other available alternative prior to the telemedicine visit and am voluntarily participating in the telemedicine visit.  I understand that I have the right to  withhold or withdraw my consent to the use of telemedicine in the course of my care at any time, without affecting my right to future care or treatment, and that the Practitioner or I may terminate the telemedicine visit at any time. I understand that I have the right to inspect all information obtained and/or recorded in the course of the telemedicine visit and may receive copies of available information for a reasonable fee.  I understand that some of the potential risks of receiving the Services via telemedicine include:  Marland Kitchen Delay or interruption in medical evaluation due to technological equipment failure or disruption; . Information transmitted may not be sufficient (e.g. poor resolution of images) to allow for appropriate medical decision making by the Practitioner; and/or  . In rare instances, security protocols could fail, causing a breach of personal health information.  Furthermore, I acknowledge that it is my responsibility to provide information about my medical history, conditions and care that is complete and accurate to the best of my ability. I acknowledge that Practitioner's advice, recommendations, and/or decision may be based on factors not within their  control, such as incomplete or inaccurate data provided by me or distortions of diagnostic images or specimens that may result from electronic transmissions. I understand that the practice of medicine is not an exact science and that Practitioner makes no warranties or guarantees regarding treatment outcomes. I acknowledge that I will receive a copy of this consent concurrently upon execution via email to the email address I last provided but may also request a printed copy by calling the office of [facility].    I understand that my insurance will be billed for this visit.   I have read or had this consent read to me. . I understand the contents of this consent, which adequately explains the benefits and risks of the Services being  provided via telemedicine.  . I have been provided ample opportunity to ask questions regarding this consent and the Services and have had my questions answered to my satisfaction. . I give my informed consent for the services to be provided through the use of telemedicine in my medical care  By participating in this telemedicine visit I agree to the above.

## 2018-09-07 NOTE — Telephone Encounter (Signed)
Notes recorded by Isaiah Serge, NP on 09/07/2018 at 12:02 PM EDT  This was off her dilt. She was feeling she would pass out. Should I send to EP? For tachybrady

## 2018-09-07 NOTE — Telephone Encounter (Signed)
Reviewed with Dr. [name] and he would like to refer pt to EP.  I spoke with pt and reviewed monitor results and told her about EP referral.  Her daughter sees Dr. Lovena Le and pt would like to see Dr.Taylor.  She would prefer [place] if possible but will come to [place] if no availability in [place]. Pt reports extreme cramping in legs and hands.  Has had for awhile but is worse lately. Happens only when she takes lasix.  Does not happen if she misses a lasix dose.  Had recent blood work and labs were stable except for magnesium at lower end of normal.  Was started on magnesium oxide for 2 weeks which she has completed.  She is asking if there is anything that can be done for the cramps or anything she can take. Pt is scheduled for in office follow up on July 23 and is asking if this can be changed to virtual visit.  I told her I would check with Dr [name] regarding cramping and office visit.

## 2018-09-12 ENCOUNTER — Telehealth: Payer: Self-pay | Admitting: *Deleted

## 2018-09-12 NOTE — Telephone Encounter (Signed)
Call placed to pt re: app 09/20/2018.  Pt is scheduled for Virtual and needs to be changed to in-office. If pt persist that she needs virtual, can change her to [facility]'s schedule, which is Virtual on the same day.

## 2018-09-18 ENCOUNTER — Other Ambulatory Visit: Payer: Self-pay | Admitting: Cardiology

## 2018-09-18 NOTE — Telephone Encounter (Signed)
Refill Request.  

## 2018-09-19 ENCOUNTER — Other Ambulatory Visit: Payer: Self-pay | Admitting: Cardiology

## 2018-09-20 ENCOUNTER — Telehealth: Payer: Medicaid Other | Admitting: Cardiology

## 2018-09-25 IMAGING — CT CT ABD-PELV W/ CM
2 of 4 series · 16 of 46 positions shown, 18 images · IV contrast (iopamidol)
Comparison: 08/20/2016

CLINICAL DATA: Severe abdominal pain starting at midnight. Nausea
and vomiting.

EXAM:
CT ABDOMEN AND PELVIS WITH CONTRAST
TECHNIQUE: Multidetector CT imaging of the abdomen and pelvis was performed
using the standard protocol following bolus administration of
intravenous contrast.
CONTRAST:  100mL LAHVJZ-DBB IOPAMIDOL (LAHVJZ-DBB) INJECTION 61%

[Series 2: axial st · axial · 0.88mm/px · z∈[+904,+1269]mm · 13 of 81 slices shown, 15 images]
[im 4/81  soft-tissue]
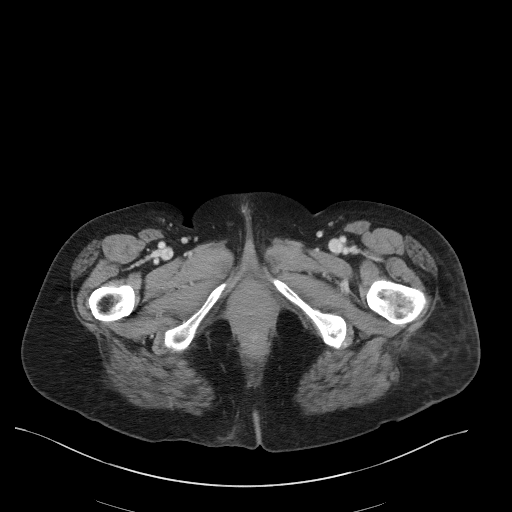
[im 4/81  bone]
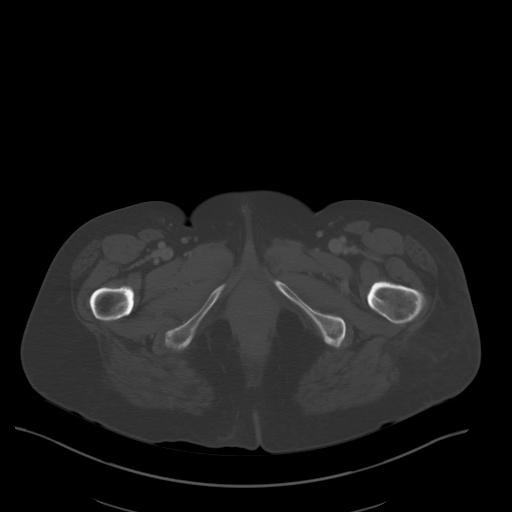
[im 12/81  soft-tissue]
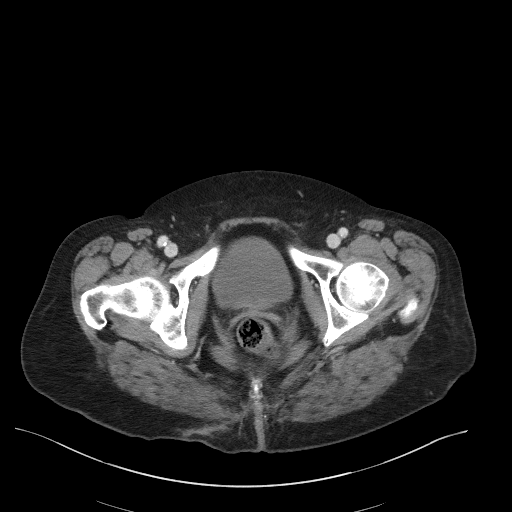
[im 16/81  soft-tissue]
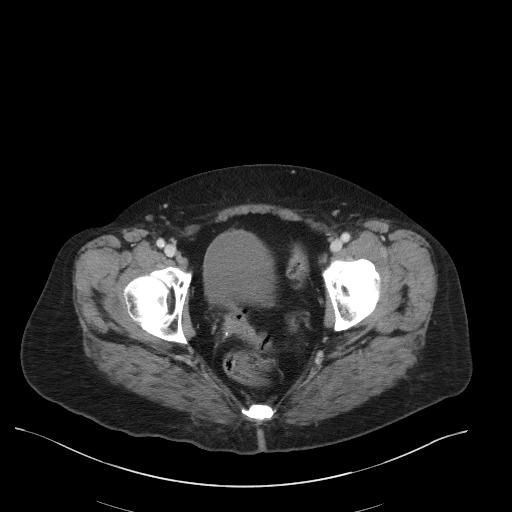
[im 23/81  soft-tissue]
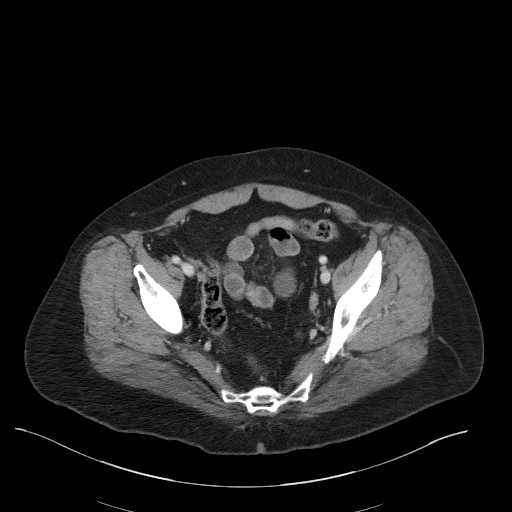
[im 27/81  soft-tissue]
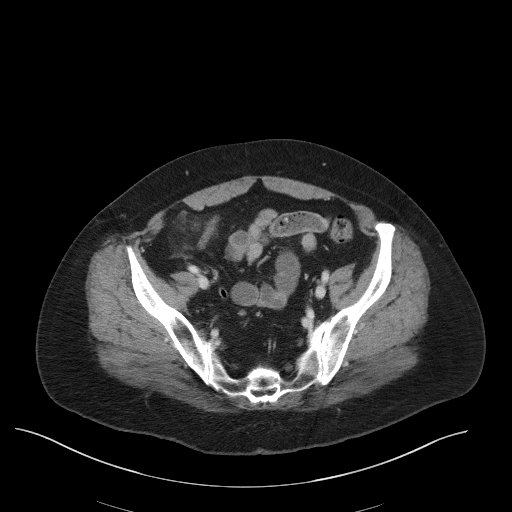
[im 35/81  soft-tissue]
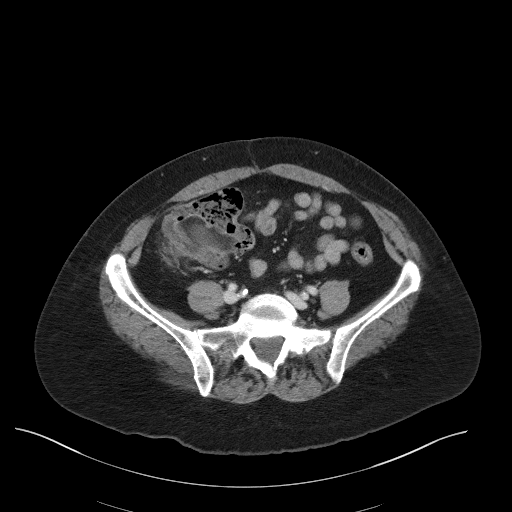
[im 42/81  soft-tissue]
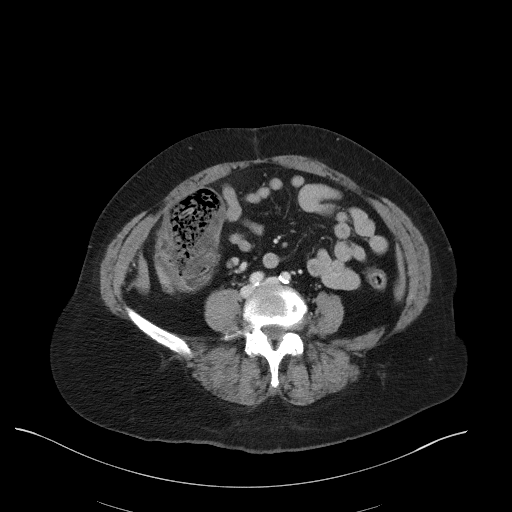
[im 46/81  soft-tissue]
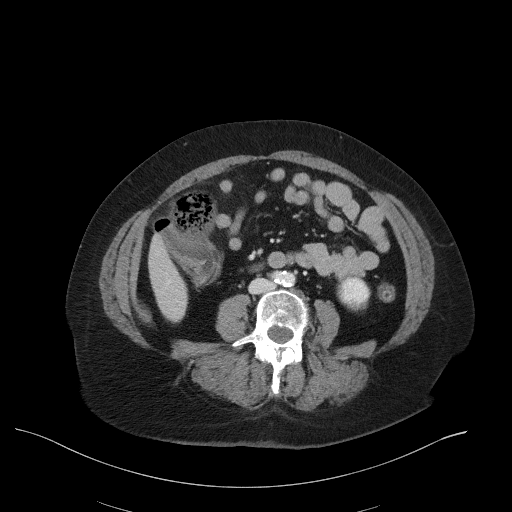
[im 54/81  soft-tissue]
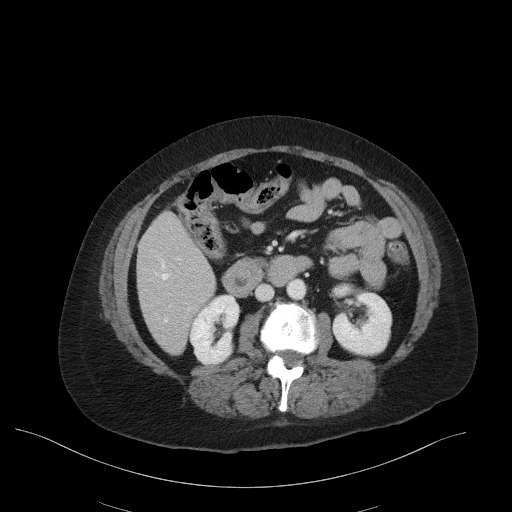
[im 54/81  bone]
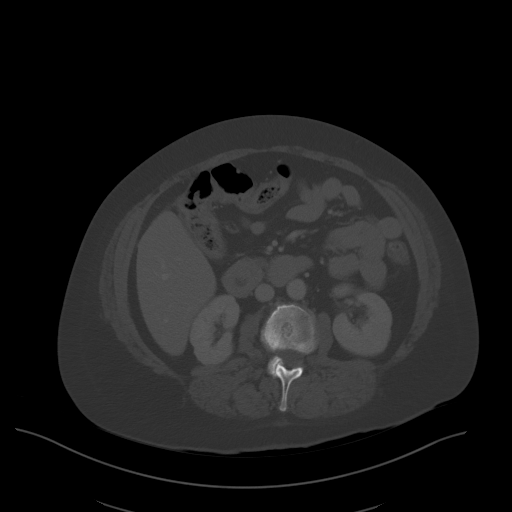
[im 58/81  soft-tissue]
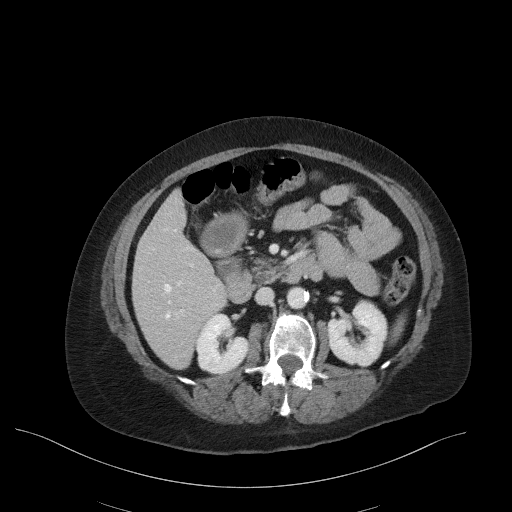
[im 65/81  soft-tissue]
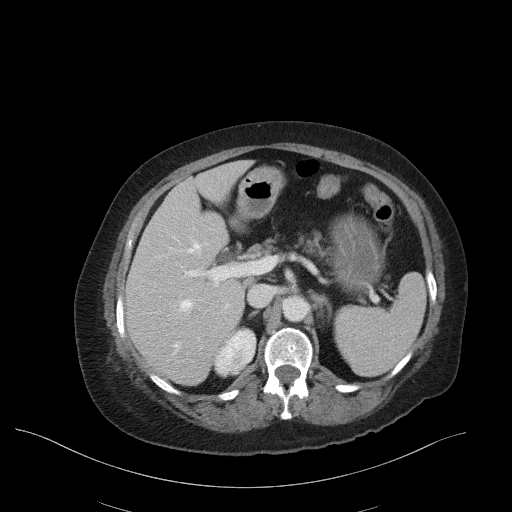
[im 69/81  soft-tissue]
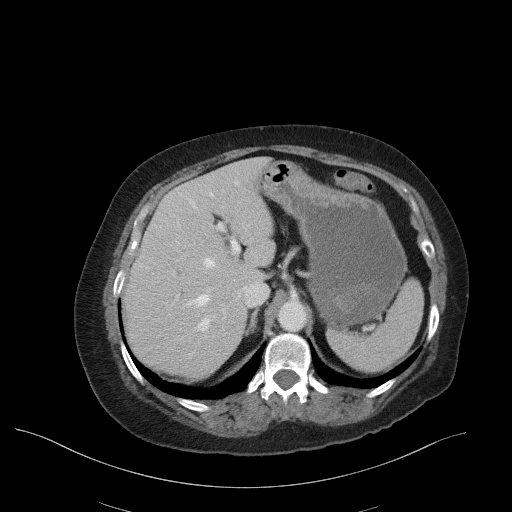
[im 77/81  soft-tissue]
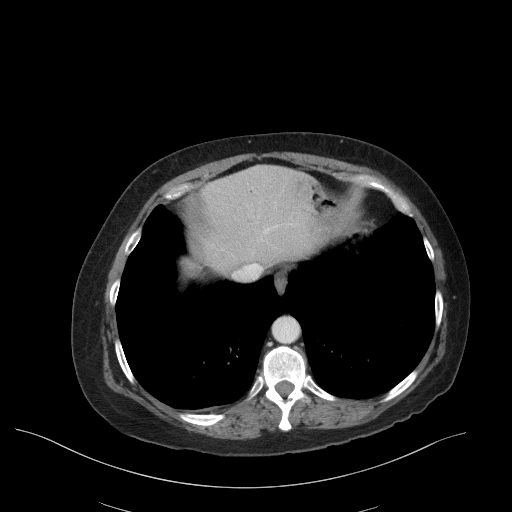

[Series 5: coronal st · coronal · 0.70mm/px · 3 of 83 slices shown]
[im 28/83  soft-tissue]
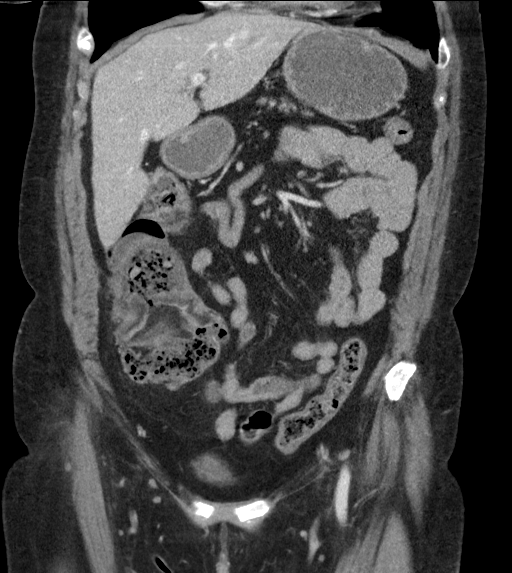
[im 37/83  soft-tissue]
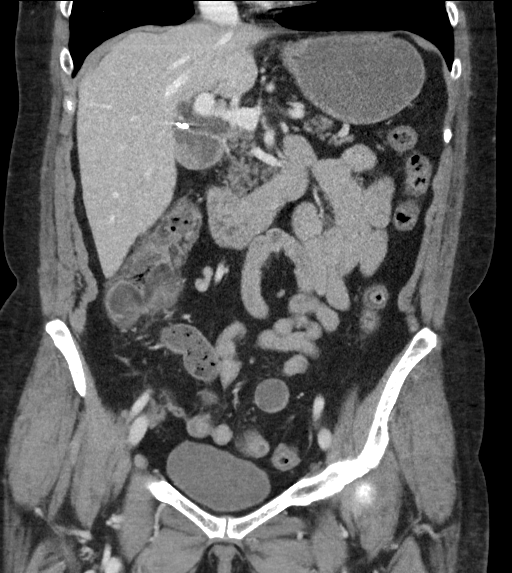
[im 46/83  soft-tissue]
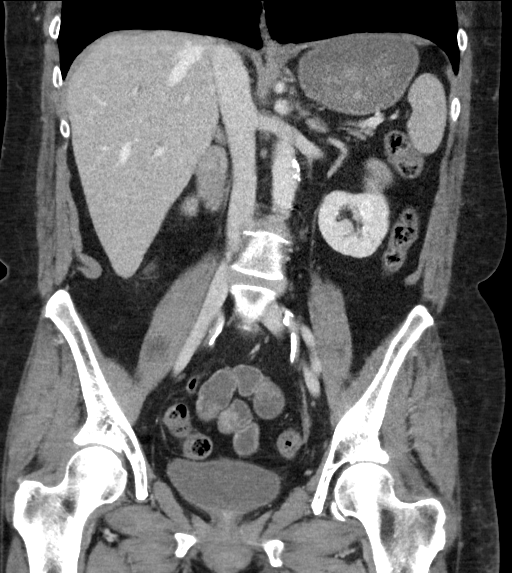

[16 of 46 positions shown; findings below may reference images not displayed]

FINDINGS: Lower chest: No acute abnormality.

Hepatobiliary: No focal liver abnormality is seen. Status post
cholecystectomy. No biliary dilatation.

Pancreas: Unremarkable. No pancreatic ductal dilatation or
surrounding inflammatory changes.

Spleen: Normal in size without focal abnormality.

Adrenals/Urinary Tract: Adrenal glands are unremarkable. Kidneys are
normal, without renal calculi, focal lesion, or hydronephrosis.
Bladder is unremarkable.

Stomach/Bowel: Stomach and small bowel are not abnormally distended.
Mild of moderate stool throughout the colon without distention.
There is wall thickening and edema in the sigmoid region of the
colon. This could represent focal colitis, typhlitis, or
inflammatory bowel disease. Mild pericolonic infiltration. Appendix
is normal.

Vascular/Lymphatic: Aortic atherosclerosis. No enlarged abdominal or
pelvic lymph nodes.

Reproductive: Status post hysterectomy. No adnexal masses.

Other: No abdominal wall hernia or abnormality. No abdominopelvic
ascites.

Musculoskeletal: Degenerative changes in the spine. Schmorl's nodes.
Focal area of mixed fat and soft tissue density in the subcutaneous
fat lateral to the proximal left femur. This may indicate contusion,
hematoma, or fat necrosis. Small hematoma in the right buttocks area
is resolving from a larger hematoma seen on the previous study in
this area.
IMPRESSION: 1. Wall thickening, edema, and infiltration in the cecum suggesting
infectious or inflammatory colitis. No obstruction.
2. Resolving hematoma in the right buttocks.
3. Hematoma versus fat necrosis lateral to the left proximal femur.
4. Aortic atherosclerosis.

## 2018-09-26 ENCOUNTER — Ambulatory Visit (INDEPENDENT_AMBULATORY_CARE_PROVIDER_SITE_OTHER): Payer: Medicaid Other | Admitting: *Deleted

## 2018-09-26 ENCOUNTER — Other Ambulatory Visit: Payer: Self-pay

## 2018-09-26 DIAGNOSIS — I4892 Unspecified atrial flutter: Secondary | ICD-10-CM | POA: Diagnosis not present

## 2018-09-26 DIAGNOSIS — Z5181 Encounter for therapeutic drug level monitoring: Secondary | ICD-10-CM

## 2018-09-26 DIAGNOSIS — Z952 Presence of prosthetic heart valve: Secondary | ICD-10-CM

## 2018-09-26 LAB — POCT INR: INR: 2.2 (ref 2.0–3.0)

## 2018-09-26 MED ORDER — WARFARIN SODIUM 5 MG PO TABS: ORAL_TABLET | ORAL | 6 refills | Status: DC

## 2018-09-26 NOTE — Patient Instructions (Signed)
Continue with 1 tablet daily.  Recheck in 5 weeks

## 2018-10-02 MED ORDER — POTASSIUM CHLORIDE CRYS ER 10 MEQ PO TBCR: 80.0000 meq | EXTENDED_RELEASE_TABLET | Freq: Two times a day (BID) | ORAL | 11 refills | Status: DC

## 2018-10-21 ENCOUNTER — Other Ambulatory Visit: Payer: Self-pay | Admitting: Cardiology

## 2018-10-22 ENCOUNTER — Other Ambulatory Visit: Payer: Self-pay | Admitting: Cardiology

## 2018-11-13 ENCOUNTER — Ambulatory Visit (INDEPENDENT_AMBULATORY_CARE_PROVIDER_SITE_OTHER): Payer: Medicaid Other | Admitting: Internal Medicine

## 2018-11-13 ENCOUNTER — Other Ambulatory Visit: Payer: Self-pay

## 2018-11-13 ENCOUNTER — Encounter: Payer: Self-pay | Admitting: Internal Medicine

## 2018-11-13 VITALS — BP 112/70 | HR 99 | Temp 96.9°F | Wt 153.0 lb

## 2018-11-13 DIAGNOSIS — R002 Palpitations: Secondary | ICD-10-CM

## 2018-11-13 NOTE — Patient Instructions (Signed)
Medication Instructions: Your physician recommends that you continue on your current medications as directed. Please refer to the Current Medication list given to you today.   Labwork: none  Procedures/Testing: none  Follow-Up: 6 months with Dr.Taylor  Any Additional Special Instructions Will Be Listed Below (If Applicable).     If you need a refill on your cardiac medications before your next appointment, please call your pharmacy.      Thank you for choosing [facility] !

## 2018-11-13 NOTE — Progress Notes (Signed)
HPI Lindsay Bonilla is referred today for evaluation of palpitations. She is a pleasant 58 yo woman with aortic stenosis, s/p AVR. She has preserved LV function. She has a narrow QRS. She developed bradycardia on cardizem and this was stopped. She has worn a cardiac monitor (zio patch for 2 weeks) and she had no significant tachy or brady arrhythmias with no significant pauses. She has not had syncope.  Allergies  Allergen Reactions  . Beta Adrenergic Blockers Anaphylaxis  . Doxycycline Other (See Comments)    "Swelling, dizziness, sleepy, talking out of my head, almost caused liver failure"  . Peanut-Containing Drug Products Anaphylaxis, Shortness Of Breath, Swelling and Other (See Comments)    Swelling of the throat  . Shrimp [Shellfish Allergy] Anaphylaxis  . Vancomycin Swelling    Rash and severe swelling  . Other     Red Meat and Dairy Products   . Avelox [Moxifloxacin Hcl In Nacl] Rash  . Ciprofloxacin Nausea And Vomiting  . Cleocin [Clindamycin Hcl] Rash  . Moxifloxacin Rash  . Sulfamethoxazole-Trimethoprim Rash  . Tape Other (See Comments)    White clear itches, rash     Current Outpatient Medications  Medication Sig Dispense Refill  . albuterol (PROVENTIL) (2.5 MG/3ML) 0.083% nebulizer solution Take 2.5 mg by nebulization every 6 (six) hours as needed for wheezing or shortness of breath.    Marland Kitchen albuterol (VENTOLIN HFA) 108 (90 BASE) MCG/ACT inhaler Inhale 1 puff into the lungs 4 (four) times daily as needed. For shortness of breath    . amoxicillin (AMOXIL) 500 MG capsule TAKE 4 CAPSULES 30 - 60 MINUTES PRIOR TO DENTAL APPOINTMENTS. 8 capsule 1  . aspirin EC 81 MG tablet Take 1 tablet (81 mg total) by mouth daily.    Marland Kitchen azelastine (ASTELIN) 0.1 % nasal spray Place 2 sprays into both nostrils 2 (two) times daily. Use in each nostril as directed    . budesonide-formoterol (SYMBICORT) 160-4.5 MCG/ACT inhaler Inhale 2 puffs into the lungs 2 (two) times daily.    . cetirizine  (ZYRTEC) 10 MG tablet Take 10 mg by mouth every morning.     Marland Kitchen EPINEPHrine (EPIPEN) 0.3 mg/0.3 mL IJ SOAJ injection Inject 0.3 mg into the muscle as needed. For allergic reaction    . Ferrous Gluconate (IRON 27 PO) Take 1 tablet by mouth daily.    . furosemide (LASIX) 80 MG tablet Take 1 tablet (80 mg total) by mouth 2 (two) times daily. 60 tablet 6  . gabapentin (NEURONTIN) 300 MG capsule Take 300 mg by mouth See admin instructions. Take 300 mg in morning and 600 at night    . losartan (COZAAR) 25 MG tablet TAKE 1 TABLET ONCE DAILY. 30 tablet 4  . magnesium oxide (MAG-OX) 400 MG tablet Take as directed 30 tablet 0  . metolazone (ZAROXOLYN) 2.5 MG tablet TAKE 1 TABLET BY MOUTH ON MONDAY, WEDNESDAY AND FRIDAY. 12 tablet 8  . montelukast (SINGULAIR) 10 MG tablet Take 10 mg by mouth at bedtime.     . naloxegol oxalate (MOVANTIK) 25 MG TABS tablet Take 25 mg by mouth daily.    . Olopatadine HCl (PATADAY) 0.2 % SOLN Place 1 drop into both eyes as needed (FOR ALLERGIES).     Marland Kitchen omeprazole (PRILOSEC) 20 MG capsule Take 40 mg by mouth 2 (two) times daily.     Marland Kitchen oxyCODONE (OXYCONTIN) 20 mg 12 hr tablet Take 20 mg by mouth every 12 (twelve) hours.     Marland Kitchen  oxyCODONE-acetaminophen (PERCOCET) 10-325 MG tablet Take 1 tablet by mouth every 4 (four) hours as needed for pain.    . potassium chloride (K-DUR) 10 MEQ tablet Take 8 tablets (80 mEq total) by mouth 2 (two) times daily. 8 tabs twice per day 480 tablet 11  . pravastatin (PRAVACHOL) 40 MG tablet TAKE 1 TABLET IN THE EVENING. 30 tablet 11  . warfarin (COUMADIN) 5 MG tablet TAKE 1 TABLET DAILY OR AS DIRECTED BY COUMADIN CLINIC. 35 tablet 4   No current facility-administered medications for this visit.      Past Medical History:  Diagnosis Date  . Anxiety   . Aortic aneurysm ([place])   . Aortic insufficiency    a. s/p [place] with mechanical AVR 4/17 >> FU Echo 6/17: Mild LVH, EF 60-65%, normal wall motion, ventricular septum with paradoxical septal motion,  St. Jude mechanical AVR functioning  normally with no regurgitation (mean 6 mmHg), MAC, trivial MR, mild RVE, trivial TR  . Aortic stenosis, severe    Bicuspid valve  . Arthritis   . Asthma   . Bronchitis   . CHF (congestive heart failure) ([place])   . COPD (chronic obstructive pulmonary disease) ([place])   . DDD (degenerative disc disease), cervical   . Depression   . Dysrhythmia    palpitations  . GERD (gastroesophageal reflux disease)   . Hard of hearing   . History of pneumonia   . 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 Migraine headache   . Palpitations   . PONV (postoperative nausea and vomiting)    "only once"  . Shortness of breath    Nodule left lung CT done 8/6  . Stress incontinence   . Urinary frequency    due to Lasix  . Valvular heart disease     ROS:   All systems reviewed and negative except as noted in the HPI.   Past Surgical History:  Procedure Laterality Date  . ABDOMINAL HYSTERECTOMY    . ANTERIOR CERVICAL DECOMP/DISCECTOMY FUSION  10/05/2011   Procedure: ANTERIOR CERVICAL DECOMPRESSION/DISCECTOMY FUSION 2 LEVELS;  Surgeon: Floyce Stakes, MD;  Location: [facility];  Service: Neurosurgery;  Laterality: N/A;  Cervical five - six , six - seven Anterior cervical decompression/diskectomy/fusion/plate  . ANTERIOR CRUCIATE LIGAMENT REPAIR Right   . AORTIC VALVE REPLACEMENT    . ARTHROSCOPIC REPAIR ACL  rt  . BENTALL PROCEDURE N/A 06/15/2015   Procedure: BENTALL PROCEDURE; HEMI-ARCH REPAIR WITH #23 ST JUDE MECHANICAL AVR CONDUIT AND #28 HEMASHIELD PLATINUM GRAFT;  Surgeon: Gaye Pollack, MD;  Location: [facility];  Service: Open Heart Surgery;  Laterality: N/A;  . CARDIAC CATHETERIZATION N/A 05/21/2015   Procedure: Right/Left Heart Cath and Coronary Angiography;  Surgeon: [name], MD;  Location: [facility];  Service: Cardiovascular;  Laterality: N/A;  . CHOLECYSTECTOMY    . COLONOSCOPY  10/13/05  . DIAGNOSTIC  LAPAROSCOPY    . JOINT REPLACEMENT Right 2016  . KNEE ARTHROSCOPY     rt x4  x1 lft  . MYRINGOPLASTY W/ FAT GRAFT     graft from behind ear  . SIGMOIDOSCOPY  10/13/05, 09/19/05  . TEE WITHOUT CARDIOVERSION N/A 06/15/2015   Procedure: TRANSESOPHAGEAL ECHOCARDIOGRAM (TEE);  Surgeon: Gaye Pollack, MD;  Location: [facility];  Service: Open Heart Surgery;  Laterality: N/A;  . TONSILLECTOMY  2005  . TUBAL LIGATION    . TYMPANOSTOMY TUBE PLACEMENT       Family History  Problem  Relation Age of Onset  . Heart attack Father   . Cancer Mother        Type unknown  . Diabetes Other        family history  . Asthma Brother   . Hyperlipidemia Other        family history     Social History   Socioeconomic History  . Marital status: Divorced    Spouse name: Not on file  . Number of children: Not on file  . Years of education: Not on file  . Highest education level: Not on file  Occupational History  . Not on file  Social Needs  . Financial resource strain: Not on file  . Food insecurity    Worry: Not on file    Inability: Not on file  . Transportation needs    Medical: Not on file    Non-medical: Not on file  Tobacco Use  . Smoking status: Former Smoker    Packs/day: 0.10    Years: 30.00    Pack years: 3.00    Types: Cigarettes    Quit date: 04/29/2015    Years since quitting: 3.5  . Smokeless tobacco: Never Used  Substance and Sexual Activity  . Alcohol use: No    Alcohol/week: 0.0 standard drinks  . Drug use: No  . Sexual activity: Never  Lifestyle  . Physical activity    Days per week: Not on file    Minutes per session: Not on file  . Stress: Not on file  Relationships  . Social Herbalist on phone: Not on file    Gets together: Not on file    Attends religious service: Not on file    Active member of club or organization: Not on file    Attends meetings of clubs or organizations: Not on file    Relationship status: Not on file  . Intimate partner violence     Fear of current or ex partner: Not on file    Emotionally abused: Not on file    Physically abused: Not on file    Forced sexual activity: Not on file  Other Topics Concern  . Not on file  Social History Narrative  . Not on file     BP 112/70   Pulse 99   Temp (!) 96.9 F (36.1 C) (Temporal)   Wt 153 lb (69.4 kg)   SpO2 98%   BMI 26.26 kg/m   Physical Exam:  Well appearing NAD HEENT: Unremarkable Neck:  No JVD, no thyromegally Lymphatics:  No adenopathy Back:  No CVA tenderness Lungs:  Clear HEART:  Regular rate rhythm, no murmurs, no rubs, no clicks Abd:  soft, positive bowel sounds, no organomegally, no rebound, no guarding Ext:  2 plus pulses, no edema, no cyanosis, no clubbing Skin:  No rashes no nodules Neuro:  CN II through XII intact, motor grossly intact  EKG - nsr (reviewed)   Assess/Plan: 1. Palpitations - review of her heart monitor demonstrates that she has minimal non-sustained atrial tachycardia. No VT. Rare PVC's. I have counseled her on the benign nature of her condition. She will undergo watchful waiting.  2. AVR - her exam demonstrates a very prominent A2. She has a hyperdynamic precordium of uncertain clinical consequence.   Mikle Bosworth.D.

## 2018-11-14 ENCOUNTER — Ambulatory Visit (INDEPENDENT_AMBULATORY_CARE_PROVIDER_SITE_OTHER): Payer: Medicaid Other | Admitting: *Deleted

## 2018-11-14 ENCOUNTER — Other Ambulatory Visit: Payer: Self-pay

## 2018-11-14 DIAGNOSIS — Z5181 Encounter for therapeutic drug level monitoring: Secondary | ICD-10-CM | POA: Diagnosis not present

## 2018-11-14 DIAGNOSIS — Z952 Presence of prosthetic heart valve: Secondary | ICD-10-CM | POA: Diagnosis not present

## 2018-11-14 DIAGNOSIS — I4892 Unspecified atrial flutter: Secondary | ICD-10-CM | POA: Diagnosis not present

## 2018-11-14 LAB — POCT INR: INR: 2.1 (ref 2.0–3.0)

## 2018-11-14 NOTE — Patient Instructions (Signed)
Continue with 1 tablet daily. Recheck in 6 weeks.  

## 2018-11-16 ENCOUNTER — Other Ambulatory Visit: Payer: Self-pay | Admitting: Cardiovascular Disease

## 2018-11-19 ENCOUNTER — Other Ambulatory Visit: Payer: Self-pay | Admitting: Cardiovascular Disease

## 2018-11-19 MED ORDER — LOSARTAN POTASSIUM 25 MG PO TABS: 25.0000 mg | ORAL_TABLET | Freq: Every day | ORAL | 9 refills | Status: DC

## 2018-11-20 ENCOUNTER — Ambulatory Visit: Payer: Medicaid Other | Admitting: Student in an Organized Health Care Education/Training Program

## 2018-12-02 ENCOUNTER — Other Ambulatory Visit: Payer: Self-pay | Admitting: Cardiovascular Disease

## 2018-12-02 DIAGNOSIS — E785 Hyperlipidemia, unspecified: Secondary | ICD-10-CM

## 2018-12-24 ENCOUNTER — Ambulatory Visit: Payer: Medicaid Other | Admitting: Physician Assistant

## 2018-12-26 ENCOUNTER — Other Ambulatory Visit: Payer: Self-pay

## 2018-12-26 ENCOUNTER — Ambulatory Visit (INDEPENDENT_AMBULATORY_CARE_PROVIDER_SITE_OTHER): Payer: Medicaid Other | Admitting: *Deleted

## 2018-12-26 DIAGNOSIS — I4892 Unspecified atrial flutter: Secondary | ICD-10-CM | POA: Diagnosis not present

## 2018-12-26 DIAGNOSIS — Z5181 Encounter for therapeutic drug level monitoring: Secondary | ICD-10-CM | POA: Diagnosis not present

## 2018-12-26 DIAGNOSIS — Z952 Presence of prosthetic heart valve: Secondary | ICD-10-CM

## 2018-12-26 LAB — POCT INR: INR: 2.8 (ref 2.0–3.0)

## 2018-12-30 ENCOUNTER — Encounter: Payer: Self-pay | Admitting: Physician Assistant

## 2018-12-30 NOTE — Progress Notes (Signed)
Virtual Visit via Telephone Note   This visit type was conducted due to national recommendations for restrictions regarding the COVID-19 Pandemic (e.g. social distancing) in an effort to limit this patient's exposure and mitigate transmission in our community.  Due to her co-morbid illnesses, this patient is at least at moderate risk for complications without adequate follow up.  This format is felt to be most appropriate for this patient at this time.  The patient did not have access to video technology/had technical difficulties with video requiring transitioning to audio format only (telephone).  All issues noted in this document were discussed and addressed.  No physical exam could be performed with this format.  Please refer to the patient's chart for her  consent to telehealth for [facility]. The patient was offered a virtual versus in person office visit in an effort to limit physical exposure during the Covid-19 pandemic and they elected to proceed with virtual visit.  Date:  12/31/2018   ID:  Lindsay Bonilla, DOB September 01, 1960, MRN [id number]  Patient Location: Home Provider Location: Office [facility])  PCP:  [name], MD  Cardiologist:  Lauree Chandler, MD  Electrophysiologist:  Cristopher Peru, MD   Evaluation Performed:  Follow-Up Visit  Chief Complaint: f/u TAA repair, arrhythmias  History of Present Illness:    Lindsay Bonilla is a 58 y.o. female with bicuspid aortic valve s/p Ross procedure in 1998 with thoracic aortic aneurysm and then Bentall procedure in April 2017, mild CAD, postop atrial flutter 2017, atrial tachycardia, COPD, GERD, anxiety, chronic diastolic CHF, tobacco abuse, PVCs, PACs, hyperlipidemia who is seen for virtual follow-up.  She has been followed for aortic valve insufficiency and thoracic aortic aneurysm.  Cardiac cath March 2017 with mild CAD (20% RCA stenosis).  She underwent Bentall procedure per Dr. Cyndia Bent on 06/15/15 via   replacement of  ascending aortic aneurysm using a 28 mm Hemashield graft and a 23 mm St. Jude Mechanical valved graft. Postoperative course was reported to be complicated by atrial flutter with RVR. She converted to NSR on diltiazem and digoxin. Beta blocker was avoided due to COPD. Coumadin was initiated for prevention of thromboembolism given mechanical aortic valve. Cardiac monitor June 2017 showed PACs, PVCs. She had normal ABI December 2018. Venous dopplers August 2019 showed no evidence of DVT. She has had prior issues with volume overload requiring metolazone. Last echo 01/2018 showed EF 60-65%, normal functioning AVR, mild MR, mild TR, normal PASP. She wore a monitor in 07/2018 for fatigue showing lowest HR 49bpm, short runs of atrial tachycardia lasting max 16 seconds, rare PVCs/PACs. She saw Dr. Lovena Le who recommended watchful waiting as he felt this was benign. Last labs 07/2018 showed BNP 494, Mg 1.6 (prompting addition of short term MagOx), K 4.1, Cr 0.94, albumin 4.7, normal AST/ALT, 01/2018 LDL 77. CT chest 07/2018 showed severe emphysema and bronchial wall thickening.  She is seen back virtually for follow-up overall doing well. She noticed her leg cramps improved significantly with the short term Mag Ox so actually began taking it daily even beyond the recommended 2 weeks. She also was able to pare down her PPI to once daily as her acid reflux symptoms had improved. She feels her current regimen is working well for her without CP, new SOB, LEE, orthopnea, palpitations or dizziness. She had to be seen at [facility] recently for a cut on her finger for which they had a hard time stopping the bleeding.  The patient does not have symptoms concerning for  COVID-19 infection (fever, chills, cough, or new shortness of breath).    Past Medical History:  Diagnosis Date   Anxiety    Aortic aneurysm (HCC)    Aortic insufficiency    a. s/p Bentall with mechanical AVR 4/17 >> FU Echo 6/17: Mild LVH, EF 60-65%, normal  wall motion, ventricular septum with paradoxical septal motion, St. 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   Migraine headache    Mild CAD    Palpitations    PAT (paroxysmal atrial tachycardia) (HCC)    PONV (postoperative nausea and vomiting)    "only once"   Premature atrial contractions    PVC's (premature ventricular contractions)    Shortness of breath    Nodule left lung CT done 8/6   Sinus bradycardia    Stress incontinence    Valvular heart disease    Past Surgical History:  Procedure Laterality Date   ABDOMINAL HYSTERECTOMY     ANTERIOR CERVICAL DECOMP/DISCECTOMY FUSION  10/05/2011   Procedure: ANTERIOR CERVICAL DECOMPRESSION/DISCECTOMY FUSION 2 LEVELS;  Surgeon: Floyce Stakes, MD;  Location: [facility];  Service: Neurosurgery;  Laterality: N/A;  Cervical five - six , six - seven Anterior cervical decompression/diskectomy/fusion/plate   ANTERIOR CRUCIATE LIGAMENT REPAIR Right    AORTIC VALVE REPLACEMENT     ARTHROSCOPIC REPAIR ACL  rt   BENTALL PROCEDURE N/A 06/15/2015   Procedure: BENTALL PROCEDURE; HEMI-ARCH REPAIR WITH #23 ST JUDE MECHANICAL AVR CONDUIT AND #28 HEMASHIELD PLATINUM GRAFT;  Surgeon: Gaye Pollack, MD;  Location: [place] OR;  Service: Open Heart Surgery;  Laterality: N/A;   CARDIAC CATHETERIZATION N/A 05/21/2015   Procedure: Right/Left Heart Cath and Coronary Angiography;  Surgeon: [name], MD;  Location: [place] [facility];  Service: Cardiovascular;  Laterality: N/A;   CHOLECYSTECTOMY     COLONOSCOPY  10/13/05   DIAGNOSTIC LAPAROSCOPY     JOINT REPLACEMENT Right 2016   KNEE ARTHROSCOPY     rt x4  x1 lft   MYRINGOPLASTY W/ FAT GRAFT     graft from behind ear   SIGMOIDOSCOPY  10/13/05, 09/19/05   TEE WITHOUT CARDIOVERSION N/A 06/15/2015   Procedure: TRANSESOPHAGEAL ECHOCARDIOGRAM (TEE);  Surgeon: Gaye Pollack, MD;  Location: [place];  Service: Open Heart Surgery;  Laterality: N/A;   TONSILLECTOMY  2005   TUBAL LIGATION     TYMPANOSTOMY TUBE PLACEMENT       Current Meds  Medication Sig   albuterol (VENTOLIN HFA) 108 (90 BASE) MCG/ACT inhaler Inhale 1 puff into the lungs 4 (four) times daily as needed. For shortness of breath   amoxicillin (AMOXIL) 500 MG capsule TAKE 4 TABLETS BY MOUTH 30-60 MINUTES PRIOR TO DENTAL PROCEDURES   aspirin EC 81 MG tablet Take 1 tablet (81 mg total) by mouth daily.   azelastine (ASTELIN) 0.1 % nasal spray Place 2 sprays into both nostrils 2 (two) times daily. Use  in each nostril as directed   budesonide-formoterol (SYMBICORT) 160-4.5 MCG/ACT inhaler Inhale 2 puffs into the lungs 2 (two) times daily.   cetirizine (ZYRTEC) 10 MG tablet Take 10 mg by mouth every morning.    EPINEPHrine (EPIPEN) 0.3 mg/0.3 mL IJ SOAJ injection Inject 0.3 mg into the muscle as needed. For allergic reaction   Ferrous Gluconate (IRON 27 PO) Take 1 tablet by mouth daily.   furosemide (LASIX) 80 MG tablet Take 1 tablet (80 mg total) by mouth 2 (two) times daily.   gabapentin (NEURONTIN) 300 MG capsule Take 300 mg by mouth See admin instructions. Take 300 mg in morning and 600 at night   losartan (COZAAR) 25 MG tablet Take 1 tablet (25 mg total) by mouth daily.   magnesium oxide (MAG-OX) 400 MG tablet Take 1 tablet (400 mg total) by mouth daily.   metolazone (ZAROXOLYN) 2.5 MG tablet TAKE 1 TABLET BY MOUTH ON MONDAY, WEDNESDAY AND FRIDAY.   montelukast (SINGULAIR) 10  MG tablet Take 10 mg by mouth at bedtime.    naloxegol oxalate (MOVANTIK) 25 MG TABS tablet Take 25 mg by mouth daily.   Olopatadine HCl (PATADAY) 0.2 % SOLN Place 1 drop into both eyes as needed (FOR ALLERGIES).    omeprazole (PRILOSEC) 40 MG capsule Take 40 mg by mouth daily.   oxyCODONE (OXYCONTIN) 20 mg 12 hr tablet Take 20 mg by mouth every 12 (twelve) hours.    oxyCODONE-acetaminophen (PERCOCET) 10-325 MG tablet Take 1 tablet by mouth every 4 (four) hours as needed for pain.   potassium chloride (K-DUR) 10 MEQ tablet Take 8 tablets (80 mEq total) by mouth 2 (two) times daily. 8 tabs twice per day   pravastatin (PRAVACHOL) 40 MG tablet Take 1 tablet (40 mg total) by mouth every evening.   warfarin (COUMADIN) 5 MG tablet TAKE 1 TABLET DAILY OR AS DIRECTED BY COUMADIN CLINIC.   [DISCONTINUED] amoxicillin (AMOXIL) 500 MG capsule TAKE 4 CAPSULES 30 - 60 MINUTES PRIOR TO DENTAL APPOINTMENTS.   [DISCONTINUED] magnesium oxide (MAG-OX) 400 MG tablet Take as directed   [DISCONTINUED] magnesium oxide (MAG-OX) 400 MG tablet Take 400 mg by mouth daily.   [DISCONTINUED] omeprazole (PRILOSEC) 20 MG capsule Take 40 mg by mouth 2 (two) times daily.    [DISCONTINUED] pravastatin (PRAVACHOL) 40 MG tablet TAKE 1 TABLET IN THE EVENING.     Allergies:   Beta adrenergic blockers, Doxycycline, Peanut-containing drug products, Shrimp [shellfish allergy], Vancomycin, Other, Avelox [moxifloxacin hcl in nacl], Ciprofloxacin, Cleocin [clindamycin hcl], Moxifloxacin, Sulfamethoxazole-trimethoprim, and Tape   Social History   Tobacco Use   Smoking status: Former Smoker    Packs/day: 0.10    Years: 30.00    Pack years: 3.00    Types: Cigarettes    Quit date: 04/29/2015    Years since quitting: 3.6   Smokeless tobacco: Never Used  Substance Use Topics   Alcohol use: No    Alcohol/week: 0.0 standard drinks   Drug use: No     Family Hx: The patient's family history includes Asthma in her  brother; Cancer in her mother; Diabetes in an other family member; Heart attack in her father; Hyperlipidemia in an other family member.  ROS:   Please see the history of present illness.    All other systems reviewed and are negative.   Prior CV studies:    Most recent pertinent cardiac studies are outlined above.  Labs/Other Tests and Data Reviewed:    EKG:  An ECG dated 09/29/17 was  personally reviewed today and demonstrated:  NSR 61bpm, TWI avL. Event monitor tracings from 07/2018 also reviewed.  Recent Labs: 08/03/2018: ALT 19; BUN 12; Creatinine, Ser 0.94; Hemoglobin 14.0; Magnesium 1.6; NT-Pro BNP 494; Platelets 322; Potassium 4.1; Sodium 141   Recent Lipid Panel Lab Results  Component Value Date/Time   CHOL 169 01/31/2018 10:49 AM   TRIG 168 (H) 01/31/2018 10:49 AM   HDL 58 01/31/2018 10:49 AM   CHOLHDL 2.9 01/31/2018 10:49 AM   CHOLHDL 2.7 10/22/2015 09:33 AM   LDLCALC 77 01/31/2018 10:49 AM   LDLDIRECT 79.0 03/25/2014 09:47 AM    Wt Readings from Last 3 Encounters:  12/31/18 153 lb (69.4 kg)  11/13/18 153 lb (69.4 kg)  08/15/18 150 lb (68 kg)     Objective:    Vital Signs:  BP 117/62    Pulse 75    Ht _0  (1.626 m)    Wt 153 lb (69.4 kg)    BMI 26.26 kg/m    VS reviewed. General - pleasant F in no acute distress Pulm - No labored breathing, no coughing during visit, no audible wheezing, speaking in full sentences Neuro - A+Ox3, no slurred speech, answers questions appropriately Psych - Pleasant affect     ASSESSMENT & PLAN:    1. Chronic diastolic CHF - by phone report, doing well. Given her h/o muscle cramps we discussed titrating down her metolazone but she is hesitant to do so as she had issues with fluid retention in the past when trying to wean down. Additionally, she feels like the cramping is much better on the magnesium and potassium doses in their current state. (She's on the KCL 10 meq's due to pill size.) Will continue tha magnesium daily for now.  She also down-titrated her PPI which may help hypomagnesemia as well. Will arrange for f/u electrolytes to include magnesium and potassium at her next convenience.  2. Paroxysmal atrial tachycardia and atrial flutter with PACs/PVCs - no specific symptoms related to this. Had event monitor this year as above. Saw EP this summer who recommended to continue to monitor. She is already anticoagulated with warfarin. Of note, she reports excessive bleeding related to a finger cut recently so will also f/u CBC as well with labs. 3. Baseline sinus bradycardia - asymptomatic. Continue to monitor. 4. Mild CAD with mixed HLD - she requests refill on her pravastatin. She is due for lipids so will arrange with labs along with CMET. Given her h/o muscle cramps which have improved, would hold off statin titration for now but can consider in the future if LDL is above goal. 5. H/o TAA/bicuspid AV s/p Bentall - reviewed SBE ppx. Updated rx sent in on file for amoxicillin. No clinical changes at this time. Continue to follow clinically.  COVID-19 Education: The signs and symptoms of COVID-19 were discussed with the patient and how to seek care for testing (follow up with PCP or arrange E-visit).  The importance of social distancing was discussed today. She has discussed with her PCP that she is at higher risk for Covid complications so tries to observe all precautions.  Time:   Today, I have spent 18 minutes with the patient with telehealth technology discussing the above problems.     Medication Adjustments/Labs and Tests Ordered: Current medicines are reviewed at length with the patient today.  Concerns regarding medicines are outlined above.   Disposition:  Follow up in 6 months with Dr .[name] in the office.  Signed, Charlie Pitter,  PA-C  12/31/2018 4:30 PM    [facility]

## 2018-12-31 ENCOUNTER — Encounter: Payer: Self-pay | Admitting: Physician Assistant

## 2018-12-31 ENCOUNTER — Other Ambulatory Visit: Payer: Self-pay

## 2018-12-31 ENCOUNTER — Telehealth (INDEPENDENT_AMBULATORY_CARE_PROVIDER_SITE_OTHER): Payer: Medicaid Other | Admitting: Physician Assistant

## 2018-12-31 VITALS — BP 117/62 | HR 75 | Ht 64.0 in | Wt 153.0 lb

## 2018-12-31 DIAGNOSIS — I712 Thoracic aortic aneurysm, without rupture, unspecified: Secondary | ICD-10-CM

## 2018-12-31 DIAGNOSIS — I5032 Chronic diastolic (congestive) heart failure: Secondary | ICD-10-CM

## 2018-12-31 DIAGNOSIS — R001 Bradycardia, unspecified: Secondary | ICD-10-CM

## 2018-12-31 DIAGNOSIS — I471 Supraventricular tachycardia: Secondary | ICD-10-CM

## 2018-12-31 DIAGNOSIS — I4892 Unspecified atrial flutter: Secondary | ICD-10-CM

## 2018-12-31 DIAGNOSIS — E782 Mixed hyperlipidemia: Secondary | ICD-10-CM

## 2018-12-31 DIAGNOSIS — I2510 Atherosclerotic heart disease of native coronary artery without angina pectoris: Secondary | ICD-10-CM

## 2018-12-31 MED ORDER — AMOXICILLIN 500 MG PO CAPS: ORAL_CAPSULE | ORAL | 1 refills | Status: DC

## 2018-12-31 MED ORDER — MAGNESIUM OXIDE 400 MG PO TABS: 400.0000 mg | ORAL_TABLET | Freq: Every day | ORAL | 3 refills | Status: AC

## 2018-12-31 MED ORDER — PRAVASTATIN SODIUM 40 MG PO TABS: 40.0000 mg | ORAL_TABLET | Freq: Every evening | ORAL | 1 refills | Status: DC

## 2018-12-31 NOTE — Patient Instructions (Signed)
Medication Instructions:  Your physician has recommended you make the following change in your medication:  1.  Continue to take the Mag Ox 400 mg daily.  I have sent in a new prescription for this to [facility].  *If you need a refill on your cardiac medications before your next appointment, please call your pharmacy*  Lab Work: ONE MORNING THIS WEEK:  GO TO THE [facility] IN THE [place] LOCATION, ON MAIN [address] AND GET THESE LABS DONE;  MAKE SURE YOU ARE FASTING, NOTHING TO EAT OR DRINK AFTER MIDNIGHT:  CMET, CBC, MAGNESIUM, & LIPID  If you have labs (blood work) drawn today and your tests are completely normal, you will receive your results only by: Marland Kitchen MyChart Message (if you have MyChart) OR . A paper copy in the mail If you have any lab test that is abnormal or we need to change your treatment, we will call you to review the results.  Testing/Procedures: None ordered  Follow-Up: At [facility], you and your health needs are our priority.  As part of our continuing mission to provide you with exceptional heart care, we have created designated Provider Care Teams.  These Care Teams include your primary Cardiologist (physician) and Advanced Practice Providers (APPs -  Physician Assistants and Nurse Practitioners) who all work together to provide you with the care you need, when you need it.  Your next appointment:   6 months  The format for your next appointment:   In Person  Provider:   You may see Lauree Chandler, MD or one of the following Advanced Practice Providers on your designated Care Team:    Melina Copa, PA-C  Ermalinda Barrios, PA-C   Other Instructions Endocarditis Information  You may be at risk for developing endocarditis since you have an artificial heart valve or a repaired heart valve. Endocarditis is an infection of the lining of the heart or heart valves. Certain surgical and dental procedures may put  you at risk, such as teeth cleaning or other dental procedures or any surgery involving the respiratory, urinary, gastrointestinal tract, gallbladder or prostate. Notify your doctor or dentist before having any invasive procedures. You will need to take antibiotics before certain procedures. To prevent endocarditis, maintain good oral health. Seek prompt medical attention for any mouth/gum, skin or urinary tract infections.

## 2019-01-02 ENCOUNTER — Other Ambulatory Visit: Payer: Self-pay | Admitting: Cardiovascular Disease

## 2019-01-14 ENCOUNTER — Other Ambulatory Visit: Payer: Self-pay | Admitting: Cardiovascular Disease

## 2019-02-03 DIAGNOSIS — R58 Hemorrhage, not elsewhere classified: Secondary | ICD-10-CM

## 2019-02-03 DIAGNOSIS — M1990 Unspecified osteoarthritis, unspecified site: Secondary | ICD-10-CM | POA: Insufficient documentation

## 2019-02-03 DIAGNOSIS — G8929 Other chronic pain: Secondary | ICD-10-CM | POA: Insufficient documentation

## 2019-02-03 DIAGNOSIS — J9611 Chronic respiratory failure with hypoxia: Secondary | ICD-10-CM

## 2019-02-03 DIAGNOSIS — I509 Heart failure, unspecified: Secondary | ICD-10-CM | POA: Insufficient documentation

## 2019-02-03 DIAGNOSIS — M545 Low back pain, unspecified: Secondary | ICD-10-CM | POA: Insufficient documentation

## 2019-02-03 DIAGNOSIS — I38 Endocarditis, valve unspecified: Secondary | ICD-10-CM | POA: Insufficient documentation

## 2019-02-03 DIAGNOSIS — E875 Hyperkalemia: Secondary | ICD-10-CM

## 2019-02-03 DIAGNOSIS — W57XXXA Bitten or stung by nonvenomous insect and other nonvenomous arthropods, initial encounter: Secondary | ICD-10-CM | POA: Insufficient documentation

## 2019-02-06 ENCOUNTER — Other Ambulatory Visit: Payer: Self-pay

## 2019-02-06 ENCOUNTER — Ambulatory Visit (INDEPENDENT_AMBULATORY_CARE_PROVIDER_SITE_OTHER): Payer: Medicaid Other | Admitting: *Deleted

## 2019-02-06 DIAGNOSIS — Z5181 Encounter for therapeutic drug level monitoring: Secondary | ICD-10-CM

## 2019-02-06 DIAGNOSIS — I4892 Unspecified atrial flutter: Secondary | ICD-10-CM

## 2019-02-06 DIAGNOSIS — Z952 Presence of prosthetic heart valve: Secondary | ICD-10-CM

## 2019-02-06 LAB — POCT INR: INR: 2.7 (ref 2.0–3.0)

## 2019-02-06 NOTE — Patient Instructions (Signed)
Continue with 1 tablet daily. Recheck in 6 weeks.  

## 2019-03-04 ENCOUNTER — Other Ambulatory Visit: Payer: Self-pay | Admitting: Cardiovascular Disease

## 2019-03-04 ENCOUNTER — Other Ambulatory Visit: Payer: Self-pay | Admitting: Physician Assistant

## 2019-03-04 DIAGNOSIS — E782 Mixed hyperlipidemia: Secondary | ICD-10-CM

## 2019-03-05 NOTE — Telephone Encounter (Signed)
This is a [place] pt °

## 2019-03-20 ENCOUNTER — Other Ambulatory Visit: Payer: Self-pay

## 2019-03-20 ENCOUNTER — Ambulatory Visit (INDEPENDENT_AMBULATORY_CARE_PROVIDER_SITE_OTHER): Payer: Medicaid Other | Admitting: *Deleted

## 2019-03-20 DIAGNOSIS — Z5181 Encounter for therapeutic drug level monitoring: Secondary | ICD-10-CM | POA: Diagnosis not present

## 2019-03-20 DIAGNOSIS — Z952 Presence of prosthetic heart valve: Secondary | ICD-10-CM | POA: Diagnosis not present

## 2019-03-20 DIAGNOSIS — I4892 Unspecified atrial flutter: Secondary | ICD-10-CM | POA: Diagnosis not present

## 2019-03-20 LAB — POCT INR: INR: 1.8 — AB (ref 2.0–3.0)

## 2019-03-20 NOTE — Patient Instructions (Signed)
Take warfarin 1 1/2 tablets today and tomorrow then resume 1 tablet daily.  Recheck in 4 weeks

## 2019-04-17 ENCOUNTER — Ambulatory Visit (INDEPENDENT_AMBULATORY_CARE_PROVIDER_SITE_OTHER): Payer: Medicaid Other | Admitting: *Deleted

## 2019-04-17 ENCOUNTER — Other Ambulatory Visit: Payer: Self-pay

## 2019-04-17 DIAGNOSIS — I4892 Unspecified atrial flutter: Secondary | ICD-10-CM

## 2019-04-17 DIAGNOSIS — Z952 Presence of prosthetic heart valve: Secondary | ICD-10-CM | POA: Diagnosis not present

## 2019-04-17 DIAGNOSIS — Z5181 Encounter for therapeutic drug level monitoring: Secondary | ICD-10-CM

## 2019-04-17 LAB — POCT INR: INR: 3.1 — AB (ref 2.0–3.0)

## 2019-04-17 NOTE — Patient Instructions (Signed)
Take warfarin 1/2 tablet today then resume 1 tablet daily.  Recheck in 4 weeks

## 2019-04-29 ENCOUNTER — Other Ambulatory Visit: Payer: Self-pay | Admitting: Cardiovascular Disease

## 2019-05-01 ENCOUNTER — Telehealth: Payer: Self-pay | Admitting: Cardiovascular Disease

## 2019-05-01 NOTE — Telephone Encounter (Signed)
Pt c/o Shortness Of Breath: STAT if SOB developed within the last 24 hours or pt is noticeably SOB on the phone  1. Are you currently SOB (can you hear that pt is SOB on the phone)? no  2. How long have you been experiencing SOB? A long time, but the pt also has COPD  3. Are you SOB when sitting or when up moving around? All the time  4. Are you currently experiencing any other symptoms? Frequent urination, lack of appetite  The patient states she has gained a lot of weight recently, but doesn't think its from fluid because she takes a fluid pill and is constantly using the restroom.  She gets winded from walking the short distance to the restroom , and once today her HR went up to 121.

## 2019-05-01 NOTE — Telephone Encounter (Signed)
Agree. Thanks

## 2019-05-01 NOTE — Telephone Encounter (Signed)
I spoke with patient. She reports shortness of breath with exertion and heart rate went up to 121 when up to bathroom today.  Heart rate returned to normal after she rested for awhile.  Patient reports she has gained 20 lbs since September. Notices swelling at times in legs. Had an episode of chest pain about 2 weeks ago that lasted about 2 hours. She is not taking her afternoon lasix on a regular basis. Will sometimes skip dose or take half. This is due to frequent urination. I advised her to make sure she takes afternoon lasix. If shortness of breath continues after doing this I asked her to call us back to schedule an earlier appointment. She is currently scheduled to see Dr [name] on April 30,2021

## 2019-05-09 ENCOUNTER — Ambulatory Visit: Payer: Medicaid Other | Admitting: Family Medicine

## 2019-05-13 ENCOUNTER — Emergency Department (HOSPITAL_COMMUNITY)
Admission: EM | Admit: 2019-05-13 | Discharge: 2019-05-13 | Discharge status: Against Medical Advice | Payer: Medicaid Other

## 2019-05-13 ENCOUNTER — Encounter (HOSPITAL_COMMUNITY): Payer: Self-pay

## 2019-05-13 ENCOUNTER — Observation Stay (HOSPITAL_COMMUNITY)
Admission: EM | Admit: 2019-05-13 | Discharge: 2019-05-13 | Disposition: A | Discharge status: Home / Self Care | Payer: Medicaid Other | Attending: Family Medicine | Admitting: Family Medicine

## 2019-05-13 ENCOUNTER — Emergency Department (HOSPITAL_COMMUNITY): Payer: Medicaid Other

## 2019-05-13 ENCOUNTER — Other Ambulatory Visit: Payer: Self-pay

## 2019-05-13 ENCOUNTER — Telehealth: Payer: Self-pay | Admitting: Cardiovascular Disease

## 2019-05-13 DIAGNOSIS — J441 Chronic obstructive pulmonary disease with (acute) exacerbation: Secondary | ICD-10-CM

## 2019-05-13 DIAGNOSIS — I4892 Unspecified atrial flutter: Secondary | ICD-10-CM | POA: Insufficient documentation

## 2019-05-13 DIAGNOSIS — I359 Nonrheumatic aortic valve disorder, unspecified: Secondary | ICD-10-CM | POA: Diagnosis present

## 2019-05-13 DIAGNOSIS — Z952 Presence of prosthetic heart valve: Secondary | ICD-10-CM | POA: Diagnosis not present

## 2019-05-13 DIAGNOSIS — Z79899 Other long term (current) drug therapy: Secondary | ICD-10-CM | POA: Diagnosis not present

## 2019-05-13 DIAGNOSIS — I350 Nonrheumatic aortic (valve) stenosis: Secondary | ICD-10-CM | POA: Diagnosis not present

## 2019-05-13 DIAGNOSIS — I712 Thoracic aortic aneurysm, without rupture, unspecified: Secondary | ICD-10-CM | POA: Diagnosis present

## 2019-05-13 DIAGNOSIS — J9621 Acute and chronic respiratory failure with hypoxia: Secondary | ICD-10-CM

## 2019-05-13 DIAGNOSIS — I2510 Atherosclerotic heart disease of native coronary artery without angina pectoris: Secondary | ICD-10-CM | POA: Diagnosis not present

## 2019-05-13 DIAGNOSIS — R0602 Shortness of breath: Secondary | ICD-10-CM | POA: Diagnosis present

## 2019-05-13 DIAGNOSIS — Z7901 Long term (current) use of anticoagulants: Secondary | ICD-10-CM | POA: Insufficient documentation

## 2019-05-13 DIAGNOSIS — J9620 Acute and chronic respiratory failure, unspecified whether with hypoxia or hypercapnia: Secondary | ICD-10-CM | POA: Diagnosis not present

## 2019-05-13 DIAGNOSIS — I5033 Acute on chronic diastolic (congestive) heart failure: Secondary | ICD-10-CM | POA: Diagnosis not present

## 2019-05-13 DIAGNOSIS — J449 Chronic obstructive pulmonary disease, unspecified: Secondary | ICD-10-CM | POA: Diagnosis not present

## 2019-05-13 DIAGNOSIS — Z20822 Contact with and (suspected) exposure to covid-19: Secondary | ICD-10-CM | POA: Diagnosis not present

## 2019-05-13 DIAGNOSIS — I493 Ventricular premature depolarization: Secondary | ICD-10-CM

## 2019-05-13 DIAGNOSIS — I471 Supraventricular tachycardia: Secondary | ICD-10-CM | POA: Diagnosis not present

## 2019-05-13 DIAGNOSIS — I2583 Coronary atherosclerosis due to lipid rich plaque: Secondary | ICD-10-CM

## 2019-05-13 DIAGNOSIS — F341 Dysthymic disorder: Secondary | ICD-10-CM | POA: Diagnosis not present

## 2019-05-13 DIAGNOSIS — R0600 Dyspnea, unspecified: Secondary | ICD-10-CM

## 2019-05-13 DIAGNOSIS — F418 Other specified anxiety disorders: Secondary | ICD-10-CM | POA: Diagnosis present

## 2019-05-13 LAB — CBC WITH DIFFERENTIAL/PLATELET
Abs Immature Granulocytes: 0.03 10*3/uL (ref 0.00–0.07)
Basophils Absolute: 0 10*3/uL (ref 0.0–0.1)
Basophils Relative: 1 %
Eosinophils Absolute: 0.1 10*3/uL (ref 0.0–0.5)
Eosinophils Relative: 2 %
HCT: 40.4 % (ref 36.0–46.0)
Hemoglobin: 12.8 g/dL (ref 12.0–15.0)
Immature Granulocytes: 1 %
Lymphocytes Relative: 25 %
Lymphs Abs: 1.5 10*3/uL (ref 0.7–4.0)
MCH: 29.4 pg (ref 26.0–34.0)
MCHC: 31.7 g/dL (ref 30.0–36.0)
MCV: 92.9 fL (ref 80.0–100.0)
Monocytes Absolute: 0.5 10*3/uL (ref 0.1–1.0)
Monocytes Relative: 8 %
Neutro Abs: 3.9 10*3/uL (ref 1.7–7.7)
Neutrophils Relative %: 63 %
Platelets: 253 10*3/uL (ref 150–400)
RBC: 4.35 MIL/uL (ref 3.87–5.11)
RDW: 12.7 % (ref 11.5–15.5)
WBC: 6.1 10*3/uL (ref 4.0–10.5)
nRBC: 0 % (ref 0.0–0.2)

## 2019-05-13 LAB — COMPREHENSIVE METABOLIC PANEL
ALT: 26 U/L (ref 0–44)
AST: 29 U/L (ref 15–41)
Albumin: 4.1 g/dL (ref 3.5–5.0)
Alkaline Phosphatase: 66 U/L (ref 38–126)
Anion gap: 10 (ref 5–15)
BUN: 22 mg/dL — ABNORMAL HIGH (ref 6–20)
CO2: 31 mmol/L (ref 22–32)
Calcium: 8.9 mg/dL (ref 8.9–10.3)
Chloride: 100 mmol/L (ref 98–111)
Creatinine, Ser: 0.68 mg/dL (ref 0.44–1.00)
GFR calc Af Amer: >60 mL/min (ref >60)
GFR calc non Af Amer: >60 mL/min (ref >60)
Glucose, Bld: 88 mg/dL (ref 70–99)
Potassium: 3.2 mmol/L — ABNORMAL LOW (ref 3.5–5.1)
Sodium: 141 mmol/L (ref 135–145)
Total Bilirubin: 0.8 mg/dL (ref 0.3–1.2)
Total Protein: 6.8 g/dL (ref 6.5–8.1)

## 2019-05-13 LAB — PROTIME-INR
INR: 2 — ABNORMAL HIGH (ref 0.8–1.2)
Prothrombin Time: 22.6 seconds — ABNORMAL HIGH (ref 11.4–15.2)

## 2019-05-13 LAB — MAGNESIUM: Magnesium: 1.8 mg/dL (ref 1.7–2.4)

## 2019-05-13 LAB — TROPONIN I (HIGH SENSITIVITY)
Troponin I (High Sensitivity): 4 ng/L (ref <18)
Troponin I (High Sensitivity): 3 ng/L (ref <18)

## 2019-05-13 LAB — SARS CORONAVIRUS 2 (TAT 6-24 HRS): SARS Coronavirus 2: NEGATIVE

## 2019-05-13 LAB — BRAIN NATRIURETIC PEPTIDE: B Natriuretic Peptide: 187 pg/mL — ABNORMAL HIGH (ref 0.0–100.0)

## 2019-05-13 MED ORDER — METHYLPREDNISOLONE SODIUM SUCC 40 MG IJ SOLR
40.0000 mg | Freq: Three times a day (TID) | INTRAMUSCULAR | Status: DC
  Administered 2019-05-13: 40 mg via INTRAVENOUS
  Filled 2019-05-13: qty 1

## 2019-05-13 MED ORDER — AZITHROMYCIN 250 MG PO TABS
500.0000 mg | ORAL_TABLET | Freq: Every day | ORAL | Status: DC
  Administered 2019-05-13: 500 mg via ORAL
  Filled 2019-05-13: qty 2

## 2019-05-13 MED ORDER — POTASSIUM CHLORIDE CRYS ER 20 MEQ PO TBCR
40.0000 meq | EXTENDED_RELEASE_TABLET | ORAL | Status: DC
  Administered 2019-05-13: 40 meq via ORAL
  Filled 2019-05-13: qty 2

## 2019-05-13 MED ORDER — FUROSEMIDE 10 MG/ML IJ SOLN
40.0000 mg | Freq: Two times a day (BID) | INTRAMUSCULAR | Status: DC
  Administered 2019-05-13: 40 mg via INTRAVENOUS
  Filled 2019-05-13: qty 4

## 2019-05-13 MED ORDER — GUAIFENESIN ER 600 MG PO TB12
600.0000 mg | ORAL_TABLET | Freq: Two times a day (BID) | ORAL | Status: DC
  Administered 2019-05-13: 600 mg via ORAL
  Filled 2019-05-13: qty 1

## 2019-05-13 NOTE — Discharge Summary (Signed)
--  Patient left AGAINST MEDICAL ADVICE shortly after being admitted to the hospital -Please see full admission H&P for same date of service -Please see full cardiology consult note for same date of service as well  Social/Disposition---  --I was notified that patient is requesting to leave [place]  -We (RN Joya Gaskins and I) went to the room to address patient's concerns ---when I walked into the room with RN Lauraine Rinne - pt was on a cell phone  talking to a member of the ED staff here--- -when asked why patient wants to leave [place]--- patient stated that she had a "misunderstanding" with a member of staff in the ED and she has already called her daughter to pick her up when she was leaving [place]  -Despite prolonged repeated attempts to apologize, explain and answer patient's questions patient insisted that she already called her daughter to pick her up and she was leaving AMA - Patient reminded that it was recommended by the cardiologist that she is to be admitted for further diuresis and further work-up including echo-- -patient again reminded [place] that she has already made up her mind and she had called her daughter to pick up -Patient requested that saline lock be removed and signed AMA papers -Patient stated that she has an appointment with Dr. Lovena Le on Friday, 03/19/2019 -And that she also has an appointment on Thursday, 05/16/2019 to get her Covid shot -Patient stated that she did not want to be in the hospital at this time -Patient left AMA despite persuasion to the contrary from RN and myself   --Patient left [place] shortly after being admitted to the hospital -Please see full admission H&P for same date of service -Please see full cardiology consult note for same date of service as well

## 2019-05-13 NOTE — ED Provider Notes (Signed)
[facility] EMERGENCY DEPARTMENT Provider Note   CSN: [id number] Arrival date & time: 05/13/19  1058     History Chief Complaint  Patient presents with  . Shortness of Breath    Lindsay Bonilla is a 59 y.o. female.  HPI Patient presents with shortness of breath.  Has been getting worse over the last couple weeks.  States she has been off her lung pills for 3 months now.  States her doctor retired and she cannot get the other medicines.  States she is still however on her heart medications.  States increasing shortness of breath and cannot do nearly physical activity that she would before.  History of chronic oxygen at home.  Also has had previous mechanical aortic valve replacement.  No fevers.  No cough.  Has had some mild swelling in her legs.  States the fluid pills to make her urinate like they used to.  States she has been putting on weight.  At home reportedly had heart rates that would down to the 30s and oxygen levels will go down to the 70s.  Patient states she has seen Dr. Lovena Le in the past for the slow heart rate.  She states she was told she may need a pacemaker, however I do not see this in his most recent note.  She is on Coumadin.      Past Medical History:  Diagnosis Date  . Anxiety   . Aortic aneurysm ([place])   . Aortic insufficiency    a. s/p [place] with mechanical AVR 4/17 >> FU Echo 6/17: Mild LVH, EF 60-65%, normal wall motion, ventricular septum with paradoxical septal motion, [employer] mechanical AVR functioning  normally with no regurgitation (mean 6 mmHg), MAC, trivial MR, mild RVE, trivial TR  . Aortic stenosis, severe    Bicuspid valve  . Arthritis   . Asthma   . Atrial flutter ([place])   . Bitten or stung by nonvenomous insect and other nonvenomous arthropods, initial encounter   . Bronchitis   . Chronic diastolic CHF (congestive heart failure) ([place])   . Chronic respiratory failure with hypoxia ([place])   . COPD (chronic obstructive pulmonary disease) ([place])     . DDD (degenerative disc disease), cervical   . Depression   . Endocarditis, valve unspecified   . GERD (gastroesophageal reflux disease)   . Hard of hearing   . Heart failure ([place])   . Heart failure, unspecified ([place])   . Hemorrhage, not elsewhere classified   . History of pneumonia   . 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 Hyperkalemia   . Low back pain   . Migraine headache   . Mild CAD   . Other chronic pain   . Palpitations   . PAT (paroxysmal atrial tachycardia) ([place])   . PONV (postoperative nausea and vomiting)    "only once"  . Premature atrial contractions   . PVC's (premature ventricular contractions)   . Shortness of breath    Nodule left lung CT done 8/6  . Sinus bradycardia   . Stress incontinence   . Unspecified osteoarthritis, unspecified site   . Valvular heart disease     Patient Active Problem List   Diagnosis Date Noted  . Endocarditis, valve unspecified   . Heart failure ([place])   . Bitten or stung by nonvenomous insect and other nonvenomous arthropods, initial encounter   . Chronic respiratory failure with hypoxia ([place])   . Heart failure, unspecified ([place])   .  Hemorrhage, not elsewhere classified   . Hyperkalemia   . Low back pain   . Other chronic pain   . Unspecified osteoarthritis, unspecified site   . Opiate overdose ([place]) 09/30/2017  . Depression with anxiety 09/30/2017  . Right foot injury 09/30/2017  . Acute encephalopathy 09/30/2017  . Chronic pain syndrome 09/30/2017  . Closed nondisplaced fracture of metatarsal bone of right foot   . Chronic diastolic CHF (congestive heart failure) ([place]) 05/05/2016  . Hyperlipidemia 07/10/2015  . Encounter for therapeutic drug monitoring 06/25/2015  . Aortic valve replaced 06/25/2015  . Atrial flutter ([place]) 06/21/2015  . S/P aortic valve replacement 06/15/2015  . S/P aortic valve replacement and aortoplasty 06/15/2015  . Aortic valve disease   . CAD (coronary artery  disease), native coronary artery 08/29/2012  . TOBACCO USER 02/25/2009  . Aneurysm of thoracic aorta ([place]) 02/25/2009  . DYSPNEA ON EXERTION 08/29/2008  . MIGRAINE HEADACHE 08/21/2008  . VALVULAR HEART DISEASE 08/21/2008  . BRONCHITIS 08/21/2008  . ASTHMA 08/21/2008  . COPD (chronic obstructive pulmonary disease) ([place]) 08/21/2008  . ARTHRITIS 08/21/2008  . PALPITATIONS 08/21/2008    Past Surgical History:  Procedure Laterality Date  . ABDOMINAL HYSTERECTOMY    . ANTERIOR CERVICAL DECOMP/DISCECTOMY FUSION  10/05/2011   Procedure: ANTERIOR CERVICAL DECOMPRESSION/DISCECTOMY FUSION 2 LEVELS;  Surgeon: Floyce Stakes, MD;  Location: [facility];  Service: Neurosurgery;  Laterality: N/A;  Cervical five - six , six - seven Anterior cervical decompression/diskectomy/fusion/plate  . ANTERIOR CRUCIATE LIGAMENT REPAIR Right   . AORTIC VALVE REPLACEMENT    . ARTHROSCOPIC REPAIR ACL  rt  . BENTALL PROCEDURE N/A 06/15/2015   Procedure: BENTALL PROCEDURE; HEMI-ARCH REPAIR WITH #23 ST JUDE MECHANICAL AVR CONDUIT AND #28 HEMASHIELD PLATINUM GRAFT;  Surgeon: Gaye Pollack, MD;  Location: [place] [facility];  Service: Open Heart Surgery;  Laterality: N/A;  . CARDIAC CATHETERIZATION N/A 05/21/2015   Procedure: Right/Left Heart Cath and Coronary Angiography;  Surgeon: [name], MD;  Location: [place] [facility];  Service: Cardiovascular;  Laterality: N/A;  . CHOLECYSTECTOMY    . COLONOSCOPY  10/13/05  . DIAGNOSTIC LAPAROSCOPY    . JOINT REPLACEMENT Right 2016  . KNEE ARTHROSCOPY     rt x4  x1 lft  . MYRINGOPLASTY W/ FAT GRAFT     graft from behind ear  . SIGMOIDOSCOPY  10/13/05, 09/19/05  . TEE WITHOUT CARDIOVERSION N/A 06/15/2015   Procedure: TRANSESOPHAGEAL ECHOCARDIOGRAM (TEE);  Surgeon: Gaye Pollack, MD;  Location: [place];  Service: Open Heart Surgery;  Laterality: N/A;  . TONSILLECTOMY  2005  . TUBAL LIGATION    . TYMPANOSTOMY TUBE PLACEMENT       OB History    Gravida      Para      Term       Preterm      AB      Living  3     SAB      TAB      Ectopic      Multiple      Live Births              Family History  Problem Relation Age of Onset  . Heart attack Father   . Cancer Mother        Type unknown  . Diabetes Other        family history  . Asthma Brother   . Hyperlipidemia Other        family history  Social History   Tobacco Use  . Smoking status: Former Smoker    Packs/day: 0.10    Years: 30.00    Pack years: 3.00    Types: Cigarettes    Quit date: 04/29/2015    Years since quitting: 4.0  . Smokeless tobacco: Never Used  Substance Use Topics  . Alcohol use: No    Alcohol/week: 0.0 standard drinks  . Drug use: No    Home Medications Prior to Admission medications   Medication Sig Start Date End Date Taking? Authorizing Provider  albuterol (PROVENTIL) (2.5 MG/3ML) 0.083% nebulizer solution Take 2.5 mg by nebulization every 6 (six) hours as needed for wheezing or shortness of breath.    [provider]  albuterol (VENTOLIN HFA) 108 (90 BASE) MCG/ACT inhaler Inhale 1 puff into the lungs 4 (four) times daily as needed. For shortness of breath    [provider]  ALPRAZolam (XANAX) 1 MG tablet Take 1 mg by mouth 2 (two) times daily.    [provider]  amoxicillin (AMOXIL) 500 MG capsule TAKE 4 TABLETS BY MOUTH 30-60 MINUTES PRIOR TO DENTAL PROCEDURES 12/31/18   Charlie Pitter, PA-C  aspirin EC 81 MG tablet Take 1 tablet (81 mg total) by mouth daily. 07/23/15   Richardson Dopp T, PA-C  azelastine (ASTELIN) 0.1 % nasal spray Place 2 sprays into both nostrils 2 (two) times daily. Use in each nostril as directed    [provider]  budesonide-formoterol (SYMBICORT) 160-4.5 MCG/ACT inhaler Inhale 2 puffs into the lungs 2 (two) times daily.    [provider]  cetirizine (ZYRTEC) 10 MG tablet Take 10 mg by mouth every morning.     [provider]  EPINEPHrine (EPIPEN) 0.3 mg/0.3 mL IJ SOAJ  injection Inject 0.3 mg into the muscle as needed. For allergic reaction    [provider]  escitalopram (LEXAPRO) 10 MG tablet Take 10 mg by mouth daily.    [provider]  Ferrous Gluconate (IRON 27 PO) Take 1 tablet by mouth daily.    [provider]  furosemide (LASIX) 80 MG tablet Take 1 tablet (80 mg total) by mouth 2 (two) times daily. 08/15/18 12/31/18  Isaiah Serge, NP  gabapentin (NEURONTIN) 300 MG capsule Take 300 mg by mouth See admin instructions. Take 300 mg in morning and 600 at night    [provider]  losartan (COZAAR) 25 MG tablet TAKE 1 TABLET BY MOUTH ONCE DAILY. 01/16/19   [name], MD  magnesium oxide (MAG-OX) 400 MG tablet Take 1 tablet (400 mg total) by mouth daily. 12/31/18   Dunn, Nedra Hai, PA-C  metolazone (ZAROXOLYN) 2.5 MG tablet TAKE 1 TABLET BY MOUTH ON MONDAY, WEDNESDAY AND FRIDAY. 03/04/19   [name], MD  montelukast (SINGULAIR) 10 MG tablet Take 10 mg by mouth at bedtime.     [provider]  naloxegol oxalate (MOVANTIK) 25 MG TABS tablet Take 25 mg by mouth daily.    [provider]  Olopatadine HCl (PATADAY) 0.2 % SOLN Place 1 drop into both eyes as needed (FOR ALLERGIES).     [provider]  omeprazole (PRILOSEC) 40 MG capsule Take 40 mg by mouth daily.    [provider]  oxyCODONE (OXYCONTIN) 20 mg 12 hr tablet Take 20 mg by mouth every 12 (twelve) hours.     [provider]  oxyCODONE-acetaminophen (PERCOCET) 10-325 MG tablet Take 1 tablet by mouth every 4 (four) hours as needed for  pain.    [provider]  potassium chloride (K-DUR) 10 MEQ tablet Take 8 tablets (80 mEq total) by mouth 2 (two) times daily. 8 tabs twice per day 10/02/18   Isaiah Serge, NP  pravastatin (PRAVACHOL) 40 MG tablet TAKE 1 TABLET IN THE EVENING. 03/05/19   Arnoldo Lenis, MD  warfarin (COUMADIN) 5 MG tablet TAKE 1 TABLET DAILY OR AS DIRECTED BY COUMADIN CLINIC.  04/30/19   [name], MD    Allergies    Beta adrenergic blockers, Doxycycline, Peanut-containing drug products, Shrimp [shellfish allergy], Vancomycin, Other, Avelox [moxifloxacin hcl in nacl], Ciprofloxacin, Cleocin [clindamycin hcl], Moxifloxacin, Sulfamethoxazole-trimethoprim, and Tape  Review of Systems   Review of Systems  Constitutional: Negative for appetite change.  HENT: Negative for congestion.   Respiratory: Positive for shortness of breath.   Cardiovascular: Positive for chest pain.  Gastrointestinal: Negative for abdominal pain.  Genitourinary: Negative for flank pain.  Skin: Negative for wound.  Neurological: Negative for weakness.  Psychiatric/Behavioral: Negative for confusion.    Physical Exam Updated Vital Signs BP 103/66   Pulse 77   Temp 98 F (36.7 C) (Oral)   Resp 18   Ht _0  (1.6 m)   Wt 77.1 kg   SpO2 100%   BMI 30.11 kg/m   Physical Exam Vitals and nursing note reviewed.  HENT:     Head: Atraumatic.  Eyes:     Pupils: Pupils are equal, round, and reactive to light.  Cardiovascular:     Comments: Loud valve click.  Also systolic murmur. Pulmonary:     Comments: Mildly harsh breath sounds without focal rales or rhonchi. Chest:     Chest wall: No tenderness.  Musculoskeletal:     Comments: Mild edema bilateral lower extremities.  Skin:    General: Skin is warm.     Capillary Refill: Capillary refill takes less than 2 seconds.  Neurological:     Mental Status: She is alert and oriented to person, place, and time.     ED Results / Procedures / Treatments   Labs (all labs ordered are listed, but only abnormal results are displayed) Labs Reviewed  BRAIN NATRIURETIC PEPTIDE - Abnormal; Notable for the following components:      Result Value   B Natriuretic Peptide 187.0 (*)    All other components within normal limits  COMPREHENSIVE METABOLIC PANEL - Abnormal; Notable for the following components:   Potassium 3.2 (*)     BUN 22 (*)    All other components within normal limits  PROTIME-INR - Abnormal; Notable for the following components:   Prothrombin Time 22.6 (*)    INR 2.0 (*)    All other components within normal limits  CBC WITH DIFFERENTIAL/PLATELET  TROPONIN I (HIGH SENSITIVITY)    EKG EKG Interpretation  Date/Time:  Monday May 13 2019 11:21:32 EDT Ventricular Rate:  83 PR Interval:    QRS Duration: 100 QT Interval:  382 QTC Calculation: 449 R Axis:   84 Text Interpretation: Sinus rhythm Ventricular bigeminy Biatrial enlargement Probable lateral infarct, age indeterminate Confirmed by Davonna Belling [phone]) on 05/13/2019 12:07:55 PM   Radiology DG Chest Portable 1 View  Result Date: 05/13/2019 CLINICAL DATA:  Increased short of breath EXAM: PORTABLE CHEST 1 VIEW COMPARISON:  None. FINDINGS: Anterior cervical fusion. Sternotomy wires overlie normal cardiac silhouette. No effusion, infiltrate pneumothorax. Mild linear interstitial markings at the lung bases. Emphysematous upper lobes. IMPRESSION: 1. Mild interstitial edema pattern at the lung bases. 2. Emphysema  in the upper lobes. Electronically Signed   By: Suzy Bouchard M.D.   On: 05/13/2019 11:50    Procedures Procedures (including critical care time)  Medications Ordered in ED Medications - No data to display  ED Course  I have reviewed the triage vital signs and the nursing notes.  Pertinent labs & imaging results that were available during my care of the patient were reviewed by me and considered in my medical decision making (see chart for details).    MDM Rules/Calculators/A&P                      Patient presents with worsening shortness of breath the last few months.  History of COPD and states has been on some of the medicines.  States she cannot do nearly a physical activity she did before.  X-ray shows mild interstitial edema along with emphysema. BNP mildly elevated.  Troponin reassuring and EKG has frequent  PVCs but otherwise is sinus.  Reportedly had a rates that will go down to the 30s at home however and also reportedly had oxygen levels down to 70%. Does have a loud valve click but also hear a systolic murmur.  Reviewing records I do not see this mention previously in the notes. I think worsening dyspnea is likely multifactorial, but feel benefit from [place] the hospital with decreased overall activity.  Will discuss with hospitalist Final Clinical Impression(s) / ED Diagnoses Final diagnoses:  Dyspnea, unspecified type  Status post aortic valve replacement    Rx / DC Orders ED Discharge Orders    None       Davonna Belling, MD 05/13/19 1214

## 2019-05-13 NOTE — ED Triage Notes (Signed)
Daughter stated, she was not trated right at [facility], so we left.

## 2019-05-13 NOTE — H&P (Addendum)
Patient Demographics:    Lindsay Bonilla, is a 59 y.o. female  MRN: [id number]   DOB - [birth year]  Admit Date - 05/13/2019  Outpatient Primary MD for the patient is System, Provider Not In   Assessment & Plan:    Principal Problem:   Acute on chronic respiratory failure (HCC) Active Problems:   COPD (chronic obstructive pulmonary disease)/Emphysema   S/P aortic valve replacement and aortoplasty   Acute on chronic heart failure with preserved ejection fraction (HFpEF) (HCC)   Anticoagulated on Coumadin   S/P aortic valve replacement   Depression with anxiety   Aneurysm of thoracic aorta (HCC)   CAD (coronary artery disease), native coronary artery   Aortic valve disease   Atrial flutter (HCC)   A/p 1)HFpEF--acute on chronic diastolic dysfunction CHF-has known EF per echo from 02/16/2018 over 60 to 65% -There is concern about weight gain, with chest x-ray suggesting mild interstitial edema and BNP of 187 -Troponin is not elevated, -Cardiology consult from Dr. Bronson Ing appreciated -Given shortness of breath and concern about weight gain and overall concern about diastolic CHF flareup admission for IV diuresis was advised patient left AMA -Repeat echo ordered, patient left AMA prior to echo being completed  2)Concerns about arrhythmia/bradycardia----previously evaluated by Dr. Lovena Le from EP, patient wore a monitor in June 2020--showing lowest HR 49bpm, short runs of atrial tachycardia lasting max 16 seconds, rare PVCs/PACs -Patient had episode of atrial flutter with RVR back in April 2017 after aortic valve surgery--apparently converted to sinus on Cardizem and digoxin at the time -Patient and daughter insist that at home her heart rate drops into the 30s. -Patient was evaluated by Dr. Lovena Le on 11/13/2018, she  has an appointment with Dr. Lovena Le on 05/17/2019   Discussed with Dr. Bronson Ing from cardiology service it is possible that patient's home monitoring system is not picking up the PVCs and PACs resulting in lower HR reading -She is not on AV blocking agent   3)s/p Aortic Valve Replacement/s/p Bentall/s/p AAA- underwent Bentall procedure per Dr. Cyndia Bent on 06/15/15 via replacement of ascending aortic aneurysm using a 28 mm Hemashield graft and a 23 mm St. Jude Mechanical valved graft------- repeat echo was ordered today but patient left AMA -INR is 2.0, she is on Coumadin  4)COPD/emphysema--patient admits to noncompliance with bronchodilators  5)Social/Disposition---  --I was notified that patient is requesting to leave [place]  -We (RN Joya Gaskins and I) went to the room to address patient's concerns ---when I walked into the room with RN Lauraine Rinne - pt was on a cell phone  talking to a member of the ED staff here--- -when asked why patient wants to leave [employer]--- patient stated that she had a "misunderstanding" with a member of staff in the ED and she has already called her daughter to pick her up when she was leaving [place]  -Despite prolonged repeated attempts to apologize, explain and answer  patient's questions patient insisted that she already called her daughter to pick her up and she was leaving [place] - Patient reminded that it was recommended by the cardiologist that she is to be admitted for further diuresis and further work-up including echo-- -patient again reminded [place] that she has already made up her mind and she had called her daughter to pick up -Patient requested that saline lock be removed and signed AMA papers -Patient stated that she has an appointment with Dr. Lovena Le on Friday, 03/19/2019 -And that she also has an appointment on Thursday, 05/16/2019 to get her Covid shot -Patient stated that she did not want to be in the hospital at  this time -Patient left AMA despite persuasion to the contrary from RN and myself  6) acute on chronic hypoxic respiratory failure--- the patient was on 2 L of oxygen continuously at home, patient states that at times her home O2 sats dropped into the 70s--- was initially started on steroids, bronchodilators and mucolytic due to underlying COPD/emphysema -Patient left AGAINST MEDICAL ADVICE  With History of - Reviewed by me  Past Medical History:  Diagnosis Date  . Anxiety   . Aortic aneurysm ([place])   . Aortic insufficiency    a. s/p Bentall with mechanical AVR 4/17 >> FU Echo 6/17: Mild LVH, EF 60-65%, normal wall motion, ventricular septum with paradoxical septal motion, St. Jude mechanical AVR functioning  normally with no regurgitation (mean 6 mmHg), MAC, trivial MR, mild RVE, trivial TR  . Aortic stenosis, severe    Bicuspid valve  . Arthritis   . Asthma   . Atrial flutter ([place])   . Bitten or stung by nonvenomous insect and other nonvenomous arthropods, initial encounter   . Bronchitis   . Chronic diastolic CHF (congestive heart failure) ([place])   . Chronic respiratory failure with hypoxia ([place])   . COPD (chronic obstructive pulmonary disease) ([place])   . DDD (degenerative disc disease), cervical   . Depression   . Endocarditis, valve unspecified   . GERD (gastroesophageal reflux disease)   . Hard of hearing   . Heart failure ([place])   . Heart failure, unspecified ([place])   . Hemorrhage, not elsewhere classified   . History of pneumonia   . 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 Hyperkalemia   . Low back pain   . Migraine headache   . Mild CAD   . Other chronic pain   . Palpitations   . PAT (paroxysmal atrial tachycardia) ([employer])   . PONV (postoperative nausea and vomiting)    "only once"  . Premature atrial contractions   . PVC's (premature ventricular contractions)   . Shortness of breath    Nodule left lung CT done 8/6  . Sinus bradycardia   .  Stress incontinence   . Unspecified osteoarthritis, unspecified site   . Valvular heart disease       Past Surgical History:  Procedure Laterality Date  . ABDOMINAL HYSTERECTOMY    . ANTERIOR CERVICAL DECOMP/DISCECTOMY FUSION  10/05/2011   Procedure: ANTERIOR CERVICAL DECOMPRESSION/DISCECTOMY FUSION 2 LEVELS;  Surgeon: Floyce Stakes, MD;  Location: [facility];  Service: Neurosurgery;  Laterality: N/A;  Cervical five - six , six - seven Anterior cervical decompression/diskectomy/fusion/plate  . ANTERIOR CRUCIATE LIGAMENT REPAIR Right   . AORTIC VALVE REPLACEMENT    . ARTHROSCOPIC REPAIR ACL  rt  . BENTALL PROCEDURE N/A 06/15/2015   Procedure: BENTALL PROCEDURE; HEMI-ARCH REPAIR WITH #23 ST JUDE  MECHANICAL AVR CONDUIT AND #28 HEMASHIELD PLATINUM GRAFT;  Surgeon: Gaye Pollack, MD;  Location: [facility];  Service: Open Heart Surgery;  Laterality: N/A;  . CARDIAC CATHETERIZATION N/A 05/21/2015   Procedure: Right/Left Heart Cath and Coronary Angiography;  Surgeon: [name], MD;  Location: [facility];  Service: Cardiovascular;  Laterality: N/A;  . CHOLECYSTECTOMY    . COLONOSCOPY  10/13/05  . DIAGNOSTIC LAPAROSCOPY    . JOINT REPLACEMENT Right 2016  . KNEE ARTHROSCOPY     rt x4  x1 lft  . MYRINGOPLASTY W/ FAT GRAFT     graft from behind ear  . SIGMOIDOSCOPY  10/13/05, 09/19/05  . TEE WITHOUT CARDIOVERSION N/A 06/15/2015   Procedure: TRANSESOPHAGEAL ECHOCARDIOGRAM (TEE);  Surgeon: Gaye Pollack, MD;  Location: [facility];  Service: Open Heart Surgery;  Laterality: N/A;  . TONSILLECTOMY  2005  . TUBAL LIGATION    . TYMPANOSTOMY TUBE PLACEMENT      Chief Complaint  Patient presents with  . Shortness of Breath      HPI:    Veneda Kirksey  is a 59 y.o. female with PMHx relevant for  bicuspid aortic valve s/p Ross procedure in 1998 with thoracic aortic aneurysm and then Bentall procedure in April 2017, mild CAD, postopatrialflutter 2017, atrial tachycardia, COPD, GERD,  anxiety, chronic diastolic CHF , tobacco abuse, PVCs, PACs, HLD, and bradycardia--- presents to the ED with concerns about shortness of breath, per patient may be up to 30 pound weight gain, bandlike chest tightness, fatigue and generalized weakness -Patient stated that her home a monitor has been picking up her heart rate as low as in the 30s and that O2 sats have been dropping as well -Patient requested cardiology evaluation Pt was seen by Dr. Bronson Ing , full cardiology consult appreciated  -CBC is unremarkable, BNP is 27, chest x-ray suggest mild interstitial edema and  Emphysema--- -CMP is unremarkable except for potassium of 3.2, magnesium is 1.8 -INR 2.0, troponins are not elevated  --I was notified that patient is requesting to leave [place]  -We (RN Joya Gaskins and I) went to the room to address patient's concerns ---when I walked into the room with RN Lauraine Rinne - pt was on a cell phone t talking to a member of the ED staff here--- -when asked why patient wants to leave [place]--- patient stated that she had a "misunderstanding" with a member of staff in the ED and she has already called her daughter to, pick her up when she was leaving [place]  -Despite prolonged repeated attempts to apologize, explain and answer patient's questions patient insisted that she already called her daughter to pick her up and she was leaving AMA  - Patient reminded that it was recommended by the cardiologist that she is to be admitted for further diuresis and further work-up including echo-- -patient again reminded [place] that she has already made up her mind and she had called her daughter to pick up -Patient requested that saline lock be removed and signed AMA papers -Patient stated that she has an appointment with Dr. Lovena Le on Friday, 03/19/2019 -And that she also has an appointment on Thursday, 05/16/2019 to get her Covid shot -Patient stated that she did not  want to be in the hospital at this time -Patient left AMA despite persuasion to the contrary from RN and myself   Review of systems:    In addition to the HPI above,   A  full Review of  Systems was done, all other systems reviewed are negative except as noted above in HPI , .    Social History:  Reviewed by me    Social History   Tobacco Use  . Smoking status: Former Smoker    Packs/day: 0.10    Years: 30.00    Pack years: 3.00    Types: Cigarettes    Quit date: 04/29/2015    Years since quitting: 4.0  . Smokeless tobacco: Never Used  Substance Use Topics  . Alcohol use: No    Alcohol/week: 0.0 standard drinks     Family History :  Reviewed by me    Family History  Problem Relation Age of Onset  . Heart attack Father   . Cancer Mother        Type unknown  . Diabetes Other        family history  . Asthma Brother   . Hyperlipidemia Other        family history     Home Medications:   Prior to Admission medications   Medication Sig Start Date End Date Taking? Authorizing Provider  albuterol (PROVENTIL) (2.5 MG/3ML) 0.083% nebulizer solution Take 2.5 mg by nebulization every 6 (six) hours as needed for wheezing or shortness of breath.   Yes [provider]  albuterol (VENTOLIN HFA) 108 (90 BASE) MCG/ACT inhaler Inhale 1 puff into the lungs 4 (four) times daily as needed. For shortness of breath   Yes [provider]  amoxicillin (AMOXIL) 500 MG capsule TAKE 4 TABLETS BY MOUTH 30-60 MINUTES PRIOR TO DENTAL PROCEDURES 12/31/18  Yes Dunn, Nedra Hai, PA-C  aspirin EC 81 MG tablet Take 1 tablet (81 mg total) by mouth daily. 07/23/15  Yes Weaver, Scott T, PA-C  cetirizine (ZYRTEC) 10 MG tablet Take 10 mg by mouth every morning.    Yes [provider]  EPINEPHrine (EPIPEN) 0.3 mg/0.3 mL IJ SOAJ injection Inject 0.3 mg into the muscle as needed. For allergic reaction   Yes [provider]  escitalopram (LEXAPRO) 10 MG tablet Take 20 mg by mouth  daily.    Yes [provider]  Ferrous Gluconate (IRON 27 PO) Take 1 tablet by mouth daily.   Yes [provider]  furosemide (LASIX) 80 MG tablet Take 1 tablet (80 mg total) by mouth 2 (two) times daily. 08/15/18 05/13/19 Yes Isaiah Serge, NP  losartan (COZAAR) 25 MG tablet TAKE 1 TABLET BY MOUTH ONCE DAILY. 01/16/19  Yes [name], MD  magnesium oxide (MAG-OX) 400 MG tablet Take 1 tablet (400 mg total) by mouth daily. 12/31/18  Yes Dunn, Dayna N, PA-C  metolazone (ZAROXOLYN) 2.5 MG tablet TAKE 1 TABLET BY MOUTH ON MONDAY, WEDNESDAY AND FRIDAY. 03/04/19  Yes [name], MD  montelukast (SINGULAIR) 10 MG tablet Take 10 mg by mouth at bedtime.    Yes [provider]  naloxegol oxalate (MOVANTIK) 25 MG TABS tablet Take 25 mg by mouth daily.   Yes [provider]  omeprazole (PRILOSEC) 40 MG capsule Take 40 mg by mouth 2 (two) times daily.    Yes [provider]  oxyCODONE-acetaminophen (PERCOCET) 10-325 MG tablet Take 1 tablet by mouth every 4 (four) hours as needed for pain.   Yes [provider]  potassium chloride (K-DUR) 10 MEQ tablet Take 8 tablets (80 mEq total) by mouth 2 (two) times daily. 8 tabs twice per day 10/02/18  Yes Isaiah Serge, NP  pravastatin (PRAVACHOL)  40 MG tablet TAKE 1 TABLET IN THE EVENING. 03/05/19  Yes Branch, Alphonse Guild, MD  warfarin (COUMADIN) 5 MG tablet TAKE 1 TABLET DAILY OR AS DIRECTED BY COUMADIN CLINIC. 04/30/19  Yes [name], MD  ALPRAZolam Duanne Moron) 1 MG tablet Take 1 mg by mouth 2 (two) times daily.    [provider]  azelastine (ASTELIN) 0.1 % nasal spray Place 2 sprays into both nostrils 2 (two) times daily. Use in each nostril as directed    [provider]  budesonide-formoterol (SYMBICORT) 160-4.5 MCG/ACT inhaler Inhale 2 puffs into the lungs 2 (two) times daily.    [provider]  gabapentin (NEURONTIN) 300 MG capsule Take 300 mg by mouth See  admin instructions. Take 300 mg in morning and 600 at night    [provider]  Olopatadine HCl (PATADAY) 0.2 % SOLN Place 1 drop into both eyes as needed (FOR ALLERGIES).     [provider]  oxyCODONE (OXYCONTIN) 20 mg 12 hr tablet Take 20 mg by mouth every 12 (twelve) hours.     [provider]     Allergies:     Allergies  Allergen Reactions  . Beta Adrenergic Blockers Anaphylaxis  . Doxycycline Other (See Comments)    "Swelling, dizziness, sleepy, talking out of my head, almost caused liver failure"  . Peanut-Containing Drug Products Anaphylaxis, Shortness Of Breath, Swelling and Other (See Comments)    Swelling of the throat  . Shrimp [Shellfish Allergy] Anaphylaxis  . Vancomycin Swelling    Rash and severe swelling  . Other     Red Meat and Dairy Products   . Avelox [Moxifloxacin Hcl In Nacl] Rash  . Ciprofloxacin Nausea And Vomiting  . Cleocin [Clindamycin Hcl] Rash  . Moxifloxacin Rash  . Sulfamethoxazole-Trimethoprim Rash  . Tape Other (See Comments)    White clear itches, rash     Physical Exam:   Vitals  Blood pressure 102/65, pulse 73, temperature 98 F (36.7 C), temperature source Oral, resp. rate 13, height _0  (1.6 m), weight 77.1 kg, SpO2 100 %.  Physical Examination: General appearance - alert, well appearing, and in no distress and  Mental status - alert, oriented to person, place, and time, patient is unhappy but cooperative, she is anxious, at times almost tearful Eyes - sclera anicteric Nose- [place] 3L/min Neck - supple, no JVD elevation , Chest -fair air movement , no rales no significant wheezing at this time  heart - S1 and S2 normal, regular , click from metallic heart valve Abdomen - soft, nontender, nondistended, no masses or organomegaly Neurological - screening mental status exam normal, neck supple without rigidity, cranial nerves II through XII intact, DTR's normal and symmetric Extremities - no pedal edema noted,  intact peripheral pulses  Skin - warm, dry     Data Review:    CBC Recent Labs  Lab 05/13/19 1128  WBC 6.1  HGB 12.8  HCT 40.4  PLT 253  MCV 92.9  MCH 29.4  MCHC 31.7  RDW 12.7  LYMPHSABS 1.5  MONOABS 0.5  EOSABS 0.1  BASOSABS 0.0   ------------------------------------------------------------------------------------------------------------------  Chemistries  Recent Labs  Lab 05/13/19 1127 05/13/19 1128  NA  --  141  K  --  3.2*  CL  --  100  CO2  --  31  GLUCOSE  --  88  BUN  --  22*  CREATININE  --  0.68  CALCIUM  --  8.9  MG 1.8  --  AST  --  29  ALT  --  26  ALKPHOS  --  66  BILITOT  --  0.8   ------------------------------------------------------------------------------------------------------------------ estimated creatinine clearance is 76.3 mL/min (by C-G formula based on SCr of 0.68 mg/dL). ------------------------------------------------------------------------------------------------------------------ No results for input(s): TSH, T4TOTAL, T3FREE, THYROIDAB in the last 72 hours.  Invalid input(s): FREET3   Coagulation profile Recent Labs  Lab 05/13/19 1128  INR 2.0*   ------------------------------------------------------------------------------------------------------------------- No results for input(s): DDIMER in the last 72 hours. -------------------------------------------------------------------------------------------------------------------  Cardiac Enzymes No results for input(s): CKMB, TROPONINI, MYOGLOBIN in the last 168 hours.  Invalid input(s): CK ------------------------------------------------------------------------------------------------------------------    Component Value Date/Time   BNP 187.0 (H) 05/13/2019 1127     ---------------------------------------------------------------------------------------------------------------  Urinalysis    Component Value Date/Time   COLORURINE YELLOW 11/02/2016 0547    APPEARANCEUR CLEAR 11/02/2016 0547   LABSPEC >1.046 (H) 11/02/2016 0547   PHURINE 6.0 11/02/2016 0547   GLUCOSEU NEGATIVE 11/02/2016 0547   HGBUR NEGATIVE 11/02/2016 [place] NEGATIVE 11/02/2016 [place] 11/02/2016 0547   PROTEINUR NEGATIVE 11/02/2016 0547   NITRITE NEGATIVE 11/02/2016 0547   LEUKOCYTESUR NEGATIVE 11/02/2016 0547    ----------------------------------------------------------------------------------------------------------------   Imaging Results:    DG Chest Portable 1 View  Result Date: 05/13/2019 CLINICAL DATA:  Increased short of breath EXAM: PORTABLE CHEST 1 VIEW COMPARISON:  None. FINDINGS: Anterior cervical fusion. Sternotomy wires overlie normal cardiac silhouette. No effusion, infiltrate pneumothorax. Mild linear interstitial markings at the lung bases. Emphysematous upper lobes. IMPRESSION: 1. Mild interstitial edema pattern at the lung bases. 2. Emphysema in the upper lobes. Electronically Signed   By: Suzy Bouchard M.D.   On: 05/13/2019 11:50    Radiological Exams on Admission: DG Chest Portable 1 View  Result Date: 05/13/2019 CLINICAL DATA:  Increased short of breath EXAM: PORTABLE CHEST 1 VIEW COMPARISON:  None. FINDINGS: Anterior cervical fusion. Sternotomy wires overlie normal cardiac silhouette. No effusion, infiltrate pneumothorax. Mild linear interstitial markings at the lung bases. Emphysematous upper lobes. IMPRESSION: 1. Mild interstitial edema pattern at the lung bases. 2. Emphysema in the upper lobes. Electronically Signed   By: Suzy Bouchard M.D.   On: 05/13/2019 11:50    DVT Prophylaxis -SCD /Coumadin therapy AM Labs Ordered, also please review Full Orders  Family Communication: Admission, patients condition and plan of care including tests being ordered have been discussed with the patient  who indicate understanding and agree with the plan   Code Status - Full Code  Likely DC to patient left AMA  Condition    stable  Roxan Hockey M.D on 05/13/2019 at 4:11 PM Go to [email or web address] -  for contact info  [facility] - Office  [phone]

## 2019-05-13 NOTE — Consult Note (Addendum)
Cardiology Consultation:   Patient ID: Lindsay Bonilla MRN: [id number]; DOB: 04/04/1960  Admit date: 05/13/2019 Date of Consult: 05/13/2019  Primary Care Provider: System, Provider Not In Primary Cardiologist: Lauree Chandler, MD  Primary Electrophysiologist:  Cristopher Peru, MD    Patient Profile:   Lindsay Bonilla is a 59 y.o. female with a hx of COPD and chronic diastolic heart failure who is being seen today for the evaluation of bradycardia and shortness of breath at the request of Dr. Denton Brick.  History of Present Illness:   Lindsay Bonilla is a 59 year old female with a history of bicuspid aortic valve s/p Ross procedure in 1998 with thoracic aortic aneurysm and then Bentall procedure in April 2017, mild CAD, postop atrial flutter 2017, atrial tachycardia, COPD, GERD, anxiety, chronic diastolic CHF, tobacco abuse, PVCs, PACs, hyperlipidemia, and bradycardia.  In summary, she has been followed for aortic valve insufficiency and thoracic aortic aneurysm.  Cardiac cath March 2017 with mild CAD (20% RCA stenosis).  She underwent Bentall procedure per Dr. Cyndia Bent on 06/15/15 via   replacement of ascending aortic aneurysm using a 28 mm Hemashield graft and a 23 mm St. Jude Mechanical valved graft. Postoperative course was reported to be complicated by atrial flutter with RVR. She converted to NSR on diltiazem and digoxin. Beta blocker was avoided due to COPD. Coumadin was initiated for prevention of thromboembolism given mechanical aortic valve. Cardiac monitor June 2017 showed PACs, PVCs. She had normal ABI December 2018. Venous dopplers August 2019 showed no evidence of DVT. She has had prior issues with volume overload requiring metolazone. Last echo 01/2018 showed EF 60-65%, normal functioning AVR, mild MR, mild TR, normal PASP. She wore a monitor in 07/2018 for fatigue showing lowest HR 49bpm, short runs of atrial tachycardia lasting max 16 seconds, rare PVCs/PACs. She saw Dr. Lovena Le who recommended  watchful waiting as he felt this was benign.  She has gradually been experiencing more shortness of breath over the last 3 weeks.  She has been off of her COPD medications for the last few months as her PCP/pulmonologist retired.  She denies fevers, chills, and cough.  She feels tightness in her chest and describes it as a "bandlike "sensation.  She currently denies abdominal distention and leg swelling.  She says she has put on 30 pounds since September and has not altered her diet since then (17 pounds by our scales).  She says her heart rate has been dropping periodically to the 30 bpm range over the last several days and also says O2 sats have been dropping to the 70% range.  She uses between 2.5 and 3 L of oxygen around-the-clock.  She quit smoking about 4 years ago.  CBC is normal.  She is hypokalemic with a potassium of 3.2.  Troponin is normal.  BNP is mildly elevated at 187.  Chest x-ray shows mild interstitial edema at the lung bases and emphysema in the upper lobes.   Past Medical History:  Diagnosis Date  . Anxiety   . Aortic aneurysm ([place])   . Aortic insufficiency    a. s/p Bentall with mechanical AVR 4/17 >> FU Echo 6/17: Mild LVH, EF 60-65%, normal wall motion, ventricular septum with paradoxical septal motion, St. Jude mechanical AVR functioning  normally with no regurgitation (mean 6 mmHg), MAC, trivial MR, mild RVE, trivial TR  . Aortic stenosis, severe    Bicuspid valve  . Arthritis   . Asthma   . Atrial flutter ([place])   . Bitten or stung  by nonvenomous insect and other nonvenomous arthropods, initial encounter   . Bronchitis   . Chronic diastolic CHF (congestive heart failure) ([place])   . Chronic respiratory failure with hypoxia ([place])   . COPD (chronic obstructive pulmonary disease) ([place])   . DDD (degenerative disc disease), cervical   . Depression   . Endocarditis, valve unspecified   . GERD (gastroesophageal reflux disease)   . Hard of hearing   . Heart failure  ([place])   . Heart failure, unspecified ([place])   . Hemorrhage, not elsewhere classified   . History of pneumonia   . 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 Hyperkalemia   . Low back pain   . Migraine headache   . Mild CAD   . Other chronic pain   . Palpitations   . PAT (paroxysmal atrial tachycardia) ([place])   . PONV (postoperative nausea and vomiting)    "only once"  . Premature atrial contractions   . PVC's (premature ventricular contractions)   . Shortness of breath    Nodule left lung CT done 8/6  . Sinus bradycardia   . Stress incontinence   . Unspecified osteoarthritis, unspecified site   . Valvular heart disease     Past Surgical History:  Procedure Laterality Date  . ABDOMINAL HYSTERECTOMY    . ANTERIOR CERVICAL DECOMP/DISCECTOMY FUSION  10/05/2011   Procedure: ANTERIOR CERVICAL DECOMPRESSION/DISCECTOMY FUSION 2 LEVELS;  Surgeon: Floyce Stakes, MD;  Location: [facility];  Service: Neurosurgery;  Laterality: N/A;  Cervical five - six , six - seven Anterior cervical decompression/diskectomy/fusion/plate  . ANTERIOR CRUCIATE LIGAMENT REPAIR Right   . AORTIC VALVE REPLACEMENT    . ARTHROSCOPIC REPAIR ACL  rt  . BENTALL PROCEDURE N/A 06/15/2015   Procedure: BENTALL PROCEDURE; HEMI-ARCH REPAIR WITH #23 ST JUDE MECHANICAL AVR CONDUIT AND #28 HEMASHIELD PLATINUM GRAFT;  Surgeon: Gaye Pollack, MD;  Location: [facility];  Service: Open Heart Surgery;  Laterality: N/A;  . CARDIAC CATHETERIZATION N/A 05/21/2015   Procedure: Right/Left Heart Cath and Coronary Angiography;  Surgeon: [name], MD;  Location: [facility];  Service: Cardiovascular;  Laterality: N/A;  . CHOLECYSTECTOMY    . COLONOSCOPY  10/13/05  . DIAGNOSTIC LAPAROSCOPY    . JOINT REPLACEMENT Right 2016  . KNEE ARTHROSCOPY     rt x4  x1 lft  . MYRINGOPLASTY W/ FAT GRAFT     graft from behind ear  . SIGMOIDOSCOPY  10/13/05, 09/19/05  . TEE WITHOUT CARDIOVERSION N/A 06/15/2015    Procedure: TRANSESOPHAGEAL ECHOCARDIOGRAM (TEE);  Surgeon: Gaye Pollack, MD;  Location: [facility];  Service: Open Heart Surgery;  Laterality: N/A;  . TONSILLECTOMY  2005  . TUBAL LIGATION    . TYMPANOSTOMY TUBE PLACEMENT       Home Medications:  Prior to Admission medications   Medication Sig Start Date End Date Taking? Authorizing Provider  albuterol (PROVENTIL) (2.5 MG/3ML) 0.083% nebulizer solution Take 2.5 mg by nebulization every 6 (six) hours as needed for wheezing or shortness of breath.    [provider]  albuterol (VENTOLIN HFA) 108 (90 BASE) MCG/ACT inhaler Inhale 1 puff into the lungs 4 (four) times daily as needed. For shortness of breath    [provider]  ALPRAZolam (XANAX) 1 MG tablet Take 1 mg by mouth 2 (two) times daily.    [provider]  amoxicillin (AMOXIL) 500 MG capsule TAKE 4 TABLETS BY MOUTH 30-60 MINUTES PRIOR TO DENTAL PROCEDURES 12/31/18  Charlie Pitter, PA-C  aspirin EC 81 MG tablet Take 1 tablet (81 mg total) by mouth daily. 07/23/15   Richardson Dopp T, PA-C  azelastine (ASTELIN) 0.1 % nasal spray Place 2 sprays into both nostrils 2 (two) times daily. Use in each nostril as directed    [provider]  budesonide-formoterol (SYMBICORT) 160-4.5 MCG/ACT inhaler Inhale 2 puffs into the lungs 2 (two) times daily.    [provider]  cetirizine (ZYRTEC) 10 MG tablet Take 10 mg by mouth every morning.     [provider]  EPINEPHrine (EPIPEN) 0.3 mg/0.3 mL IJ SOAJ injection Inject 0.3 mg into the muscle as needed. For allergic reaction    [provider]  escitalopram (LEXAPRO) 10 MG tablet Take 10 mg by mouth daily.    [provider]  Ferrous Gluconate (IRON 27 PO) Take 1 tablet by mouth daily.    [provider]  furosemide (LASIX) 80 MG tablet Take 1 tablet (80 mg total) by mouth 2 (two) times daily. 08/15/18 12/31/18  Isaiah Serge, NP  gabapentin (NEURONTIN) 300 MG capsule Take 300 mg by  mouth See admin instructions. Take 300 mg in morning and 600 at night    [provider]  losartan (COZAAR) 25 MG tablet TAKE 1 TABLET BY MOUTH ONCE DAILY. 01/16/19   [name], MD  magnesium oxide (MAG-OX) 400 MG tablet Take 1 tablet (400 mg total) by mouth daily. 12/31/18   Dunn, Nedra Hai, PA-C  metolazone (ZAROXOLYN) 2.5 MG tablet TAKE 1 TABLET BY MOUTH ON MONDAY, WEDNESDAY AND FRIDAY. 03/04/19   [name], MD  montelukast (SINGULAIR) 10 MG tablet Take 10 mg by mouth at bedtime.     [provider]  naloxegol oxalate (MOVANTIK) 25 MG TABS tablet Take 25 mg by mouth daily.    [provider]  Olopatadine HCl (PATADAY) 0.2 % SOLN Place 1 drop into both eyes as needed (FOR ALLERGIES).     [provider]  omeprazole (PRILOSEC) 40 MG capsule Take 40 mg by mouth daily.    [provider]  oxyCODONE (OXYCONTIN) 20 mg 12 hr tablet Take 20 mg by mouth every 12 (twelve) hours.     [provider]  oxyCODONE-acetaminophen (PERCOCET) 10-325 MG tablet Take 1 tablet by mouth every 4 (four) hours as needed for pain.    [provider]  potassium chloride (K-DUR) 10 MEQ tablet Take 8 tablets (80 mEq total) by mouth 2 (two) times daily. 8 tabs twice per day 10/02/18   Isaiah Serge, NP  pravastatin (PRAVACHOL) 40 MG tablet TAKE 1 TABLET IN THE EVENING. 03/05/19   Arnoldo Lenis, MD  warfarin (COUMADIN) 5 MG tablet TAKE 1 TABLET DAILY OR AS DIRECTED BY COUMADIN CLINIC. 04/30/19   [name], MD    Inpatient Medications: Scheduled Meds: . azithromycin  500 mg Oral Daily  . furosemide  40 mg Intravenous Q12H  . guaiFENesin  600 mg Oral BID  . methylPREDNISolone (SOLU-MEDROL) injection  40 mg Intravenous Q8H  . potassium chloride  40 mEq Oral Q3H   Continuous Infusions:  PRN Meds:   Allergies:    Allergies  Allergen Reactions  . Beta Adrenergic Blockers Anaphylaxis  . Doxycycline Other (See Comments)     "Swelling, dizziness, sleepy, talking out of my head, almost caused liver failure"  . Peanut-Containing Drug Products Anaphylaxis, Shortness Of Breath, Swelling and Other (See Comments)    Swelling of the throat  .  Shrimp [Shellfish Allergy] Anaphylaxis  . Vancomycin Swelling    Rash and severe swelling  . Other     Red Meat and Dairy Products   . Avelox [Moxifloxacin Hcl In Nacl] Rash  . Ciprofloxacin Nausea And Vomiting  . Cleocin [Clindamycin Hcl] Rash  . Moxifloxacin Rash  . Sulfamethoxazole-Trimethoprim Rash  . Tape Other (See Comments)    White clear itches, rash    Social History:   Social History   Socioeconomic History  . Marital status: Divorced    Spouse name: Not on file  . Number of children: Not on file  . Years of education: Not on file  . Highest education level: Not on file  Occupational History  . Not on file  Tobacco Use  . Smoking status: Former Smoker    Packs/day: 0.10    Years: 30.00    Pack years: 3.00    Types: Cigarettes    Quit date: 04/29/2015    Years since quitting: 4.0  . Smokeless tobacco: Never Used  Substance and Sexual Activity  . Alcohol use: No    Alcohol/week: 0.0 standard drinks  . Drug use: No  . Sexual activity: Never  Other Topics Concern  . Not on file  Social History Narrative  . Not on file   Social Determinants of Health   Financial Resource Strain:   . Difficulty of Paying Living Expenses:   Food Insecurity:   . Worried About Charity fundraiser in the Last Year:   . Arboriculturist in the Last Year:   Transportation Needs:   . Film/video editor (Medical):   Marland Kitchen Lack of Transportation (Non-Medical):   Physical Activity:   . Days of Exercise per Week:   . Minutes of Exercise per Session:   Stress:   . Feeling of Stress :   Social Connections:   . Frequency of Communication with Friends and Family:   . Frequency of Social Gatherings with Friends and Family:   . Attends Religious Services:   . Active  Member of Clubs or Organizations:   . Attends Archivist Meetings:   Marland Kitchen Marital Status:   Intimate Partner Violence:   . Fear of Current or Ex-Partner:   . Emotionally Abused:   Marland Kitchen Physically Abused:   . Sexually Abused:     Family History:    Family History  Problem Relation Age of Onset  . Heart attack Father   . Cancer Mother        Type unknown  . Diabetes Other        family history  . Asthma Brother   . Hyperlipidemia Other        family history     ROS:  Please see the history of present illness.   All other ROS reviewed and negative.     Orson Slick, LPN was present throughout the entirety of the encounter.  Physical Exam/Data:   Vitals:   05/13/19 1115 05/13/19 1118 05/13/19 1130  BP:  103/66 90/78  Pulse:  77 72  Resp:  18 19  Temp:  98 F (36.7 C)   TempSrc:  Oral   SpO2:  100% 100%  Weight: 77.1 kg    Height: _0  (1.6 m)     No intake or output data in the 24 hours ending 05/13/19 1302 Last 3 Weights 05/13/2019 12/31/2018 11/13/2018  Weight (lbs) 170 lb 153 lb 153 lb  Weight (kg) 77.111 kg 69.4 kg 69.4 kg  Body mass index is 30.11 kg/m.  General:  Well nourished, well developed, in no acute distress HEENT: normal Lymph: no adenopathy Neck: no JVD Endocrine:  No thryomegaly Cardiac:  normal S1, prosthetic S2; RRR; 3/6 systolic murmur heard throughout precordium Lungs: Poor air movement throughout, no crackles or wheezes Abd: soft, nontender, no hepatomegaly  Ext: no edema Musculoskeletal:  No deformities, BUE and BLE strength normal and equal Skin: warm and dry  Neuro:  CNs 2-12 intact, no focal abnormalities noted Psych:  Normal affect   EKG:  The EKG was personally reviewed and demonstrates: Sinus rhythm with PVCs Telemetry:  Telemetry was personally reviewed and demonstrates: Sinus rhythm with PVCs  Relevant CV Studies: Echocardiogram and event monitor reviewed above  Laboratory Data:  High Sensitivity Troponin:     Recent Labs  Lab 05/13/19 1128  TROPONINIHS 4     Chemistry Recent Labs  Lab 05/13/19 1128  NA 141  K 3.2*  CL 100  CO2 31  GLUCOSE 88  BUN 22*  CREATININE 0.68  CALCIUM 8.9  GFRNONAA >60  GFRAA >60  ANIONGAP 10    Recent Labs  Lab 05/13/19 1128  PROT 6.8  ALBUMIN 4.1  AST 29  ALT 26  ALKPHOS 66  BILITOT 0.8   Hematology Recent Labs  Lab 05/13/19 1128  WBC 6.1  RBC 4.35  HGB 12.8  HCT 40.4  MCV 92.9  MCH 29.4  MCHC 31.7  RDW 12.7  PLT 253   BNP Recent Labs  Lab 05/13/19 1127  BNP 187.0*    DDimer No results for input(s): DDIMER in the last 168 hours.   Radiology/Studies:  DG Chest Portable 1 View  Result Date: 05/13/2019 CLINICAL DATA:  Increased short of breath EXAM: PORTABLE CHEST 1 VIEW COMPARISON:  None. FINDINGS: Anterior cervical fusion. Sternotomy wires overlie normal cardiac silhouette. No effusion, infiltrate pneumothorax. Mild linear interstitial markings at the lung bases. Emphysematous upper lobes. IMPRESSION: 1. Mild interstitial edema pattern at the lung bases. 2. Emphysema in the upper lobes. Electronically Signed   By: Suzy Bouchard M.D.   On: 05/13/2019 11:50         Assessment and Plan:   1.  Shortness of breath/acute on chronic diastolic heart failure/COPD exacerbation: Shortness of breath is multifactorial in etiology and due to both acute on chronic COPD exacerbation as well as acute on chronic diastolic heart failure.  She has been off of her pulmonary medications for the past few months.  She claims of put on 30 pounds since September and is up about 17 pounds by our scales since that time.  She normally takes furosemide 80 mg twice daily and metolazone 2.5 mg every Monday, Wednesday, and Friday.  She has been started on IV Lasix 40 mg twice daily.  I would consider switching her to torsemide prior to discharge.  An echocardiogram has been ordered and is pending. She has also been started on azithromycin and Solu-Medrol by  internal medicine.  2.  Paroxysmal atrial tachycardia and atrial flutter with PACs/PVCs: She denies palpitations.  Previous cardiac monitor reviewed above with no significant bradycardia noted.  She says heart rate is dropping down to the 30 bpm range at home.  ECG and telemetry here demonstrate sinus rhythm with PVCs.  I am not certain if her home monitor was miscounting due to PVCs and thus reported significant bradycardia.  She is not on AV nodal blocking agents.  She was evaluated by EP (Dr. Lovena Le) on 11/13/2018 who recommended  watchful waiting.  3.  Mild coronary disease: Continue statin.  4. H/o TAA/bicuspid AV s/p Bentall: Follow-up echocardiogram has been ordered and is pending.  No pronounced diastolic murmurs on physical exam.     For questions or updates, please contact [place] Please consult [email or web address] for contact info under     Signed, Kate Sable, MD  05/13/2019 1:02 PM

## 2019-05-13 NOTE — Telephone Encounter (Signed)
Call came straight to triage. Lindsay Bonilla's daughter is on the phone stating Lindsay Bonilla's HR is in the 30's and O2 is in the 70's wearing oxygen. She is requesting an emergency appointment with Dr. Lovena Le. Encouraged Lindsay Bonilla's daughter to take Lindsay Bonilla to ER with Lindsay Bonilla's HR and O2 being so low. Lindsay Bonilla's daughter verbalized understanding and they will head to ER right now. Will forward to Dr. Lovena Le and Dr. [name], so they are aware.

## 2019-05-13 NOTE — Telephone Encounter (Signed)
   STAT if HR is under 50 or over 120 (normal HR is 60-100 beats per minute)  1) What is your heart rate? 30  2) Do you have a log of your heart rate readings (document readings)? 30, o2 level 70%   3) Do you have any other symptoms? Pt's daughter calling because Pt's HR dropped 30 this morning with o2 level of 70%. She's with the pt right now.

## 2019-05-13 NOTE — ED Triage Notes (Signed)
Pt presents to ED with complaints of increased SOB, and low heartrate progressively gotten worse. Pt states HR has been dropping to the 30's and O2 dropped to 70's. Pt chronically on 2.5 L of O2 at home for COPD.

## 2019-05-13 NOTE — ED Notes (Signed)
Daughter took pt. Out and put into the car. Never said anything

## 2019-05-17 ENCOUNTER — Ambulatory Visit (INDEPENDENT_AMBULATORY_CARE_PROVIDER_SITE_OTHER): Payer: Medicaid Other | Admitting: Internal Medicine

## 2019-05-17 ENCOUNTER — Encounter: Payer: Self-pay | Admitting: Internal Medicine

## 2019-05-17 ENCOUNTER — Other Ambulatory Visit: Payer: Self-pay

## 2019-05-17 VITALS — BP 126/76 | HR 80 | Temp 97.8°F | Ht 63.0 in | Wt 178.0 lb

## 2019-05-17 DIAGNOSIS — I5032 Chronic diastolic (congestive) heart failure: Secondary | ICD-10-CM

## 2019-05-17 NOTE — Progress Notes (Signed)
HPI Lindsay Bonilla returns today after experiencing worsening CHF symptoms. She was found to have a reduced oxygen sat and bradycardia and was seen in the ED last week. She left AMA. She was given IV lasix. She has a myriad of complaints. Her reduced HR appeared to be due to ectopy. She was out of her bronchodilators. She has a long h/o oxygen dependent COPD and has a h/o AVR times 2. She developed sinus node dysfunction on cardizem previously. She feels better. She has class 2 dyspnea. She is pending a visit with Dr. Elder Love next month. Allergies  Allergen Reactions  . Beta Adrenergic Blockers Anaphylaxis  . Doxycycline Other (See Comments)    "Swelling, dizziness, sleepy, talking out of my head, almost caused liver failure"  . Peanut-Containing Drug Products Anaphylaxis, Shortness Of Breath, Swelling and Other (See Comments)    Swelling of the throat  . Shrimp [Shellfish Allergy] Anaphylaxis  . Vancomycin Swelling    Rash and severe swelling  . Other     Red Meat and Dairy Products   . Avelox [Moxifloxacin Hcl In Nacl] Rash  . Ciprofloxacin Nausea And Vomiting  . Cleocin [Clindamycin Hcl] Rash  . Moxifloxacin Rash  . Sulfamethoxazole-Trimethoprim Rash  . Tape Other (See Comments)    White clear itches, rash     Current Outpatient Medications  Medication Sig Dispense Refill  . albuterol (PROVENTIL) (2.5 MG/3ML) 0.083% nebulizer solution Take 2.5 mg by nebulization every 6 (six) hours as needed for wheezing or shortness of breath.    Marland Kitchen albuterol (VENTOLIN HFA) 108 (90 BASE) MCG/ACT inhaler Inhale 1 puff into the lungs 4 (four) times daily as needed. For shortness of breath    . ALPRAZolam (XANAX) 1 MG tablet Take 1 mg by mouth 2 (two) times daily.    Marland Kitchen amoxicillin (AMOXIL) 500 MG capsule TAKE 4 TABLETS BY MOUTH 30-60 MINUTES PRIOR TO DENTAL PROCEDURES 8 capsule 1  . aspirin EC 81 MG tablet Take 1 tablet (81 mg total) by mouth daily.    Marland Kitchen azelastine (ASTELIN) 0.1 % nasal spray Place  2 sprays into both nostrils 2 (two) times daily. Use in each nostril as directed    . budesonide-formoterol (SYMBICORT) 160-4.5 MCG/ACT inhaler Inhale 2 puffs into the lungs 2 (two) times daily.    . cetirizine (ZYRTEC) 10 MG tablet Take 10 mg by mouth every morning.     Marland Kitchen EPINEPHrine (EPIPEN) 0.3 mg/0.3 mL IJ SOAJ injection Inject 0.3 mg into the muscle as needed. For allergic reaction    . escitalopram (LEXAPRO) 10 MG tablet Take 20 mg by mouth daily.     . Ferrous Gluconate (IRON 27 PO) Take 1 tablet by mouth daily.    Marland Kitchen gabapentin (NEURONTIN) 300 MG capsule Take 300 mg by mouth See admin instructions. Take 300 mg in morning and 600 at night    . losartan (COZAAR) 25 MG tablet TAKE 1 TABLET BY MOUTH ONCE DAILY. 90 tablet 3  . magnesium oxide (MAG-OX) 400 MG tablet Take 1 tablet (400 mg total) by mouth daily. 90 tablet 3  . metolazone (ZAROXOLYN) 2.5 MG tablet TAKE 1 TABLET BY MOUTH ON MONDAY, WEDNESDAY AND FRIDAY. 12 tablet 2  . montelukast (SINGULAIR) 10 MG tablet Take 10 mg by mouth at bedtime.     . naloxegol oxalate (MOVANTIK) 25 MG TABS tablet Take 25 mg by mouth daily.    . Olopatadine HCl (PATADAY) 0.2 % SOLN Place 1 drop into both eyes as  needed (FOR ALLERGIES).     Marland Kitchen omeprazole (PRILOSEC) 40 MG capsule Take 40 mg by mouth 2 (two) times daily.     Marland Kitchen oxyCODONE (OXYCONTIN) 20 mg 12 hr tablet Take 20 mg by mouth every 12 (twelve) hours.     Marland Kitchen oxyCODONE-acetaminophen (PERCOCET) 10-325 MG tablet Take 1 tablet by mouth every 4 (four) hours as needed for pain.    . potassium chloride (K-DUR) 10 MEQ tablet Take 8 tablets (80 mEq total) by mouth 2 (two) times daily. 8 tabs twice per day 480 tablet 11  . pravastatin (PRAVACHOL) 40 MG tablet TAKE 1 TABLET IN THE EVENING. 30 tablet 6  . warfarin (COUMADIN) 5 MG tablet TAKE 1 TABLET DAILY OR AS DIRECTED BY COUMADIN CLINIC. 34 tablet 6  . furosemide (LASIX) 80 MG tablet Take 1 tablet (80 mg total) by mouth 2 (two) times daily. 60 tablet 6   No  current facility-administered medications for this visit.     Past Medical History:  Diagnosis Date  . Anxiety   . Aortic aneurysm ([place])   . Aortic insufficiency    a. s/p [place] with mechanical AVR 4/17 >> FU Echo 6/17: Mild LVH, EF 60-65%, normal wall motion, ventricular septum with paradoxical septal motion, [employer]. [employer] mechanical AVR functioning  normally with no regurgitation (mean 6 mmHg), MAC, trivial MR, mild RVE, trivial TR  . Aortic stenosis, severe    Bicuspid valve  . Arthritis   . Asthma   . Atrial flutter ([place])   . Bitten or stung by nonvenomous insect and other nonvenomous arthropods, initial encounter   . Bronchitis   . Chronic diastolic CHF (congestive heart failure) ([place])   . Chronic respiratory failure with hypoxia ([place])   . COPD (chronic obstructive pulmonary disease) ([place])   . DDD (degenerative disc disease), cervical   . Depression   . Endocarditis, valve unspecified   . GERD (gastroesophageal reflux disease)   . Hard of hearing   . Heart failure ([place])   . Heart failure, unspecified ([place])   . Hemorrhage, not elsewhere classified   . History of pneumonia   . 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 Hyperkalemia   . Low back pain   . Migraine headache   . Mild CAD   . Other chronic pain   . Palpitations   . PAT (paroxysmal atrial tachycardia) ([place])   . PONV (postoperative nausea and vomiting)    "only once"  . Premature atrial contractions   . PVC's (premature ventricular contractions)   . Shortness of breath    Nodule left lung CT done 8/6  . Sinus bradycardia   . Stress incontinence   . Unspecified osteoarthritis, unspecified site   . Valvular heart disease     ROS:   All systems reviewed and negative except as noted in the HPI.   Past Surgical History:  Procedure Laterality Date  . ABDOMINAL HYSTERECTOMY    . ANTERIOR CERVICAL DECOMP/DISCECTOMY FUSION  10/05/2011   Procedure: ANTERIOR CERVICAL  DECOMPRESSION/DISCECTOMY FUSION 2 LEVELS;  Surgeon: Floyce Stakes, MD;  Location: [facility];  Service: Neurosurgery;  Laterality: N/A;  Cervical five - six , six - seven Anterior cervical decompression/diskectomy/fusion/plate  . ANTERIOR CRUCIATE LIGAMENT REPAIR Right   . AORTIC VALVE REPLACEMENT    . ARTHROSCOPIC REPAIR ACL  rt  . BENTALL PROCEDURE N/A 06/15/2015   Procedure: BENTALL PROCEDURE; HEMI-ARCH REPAIR WITH #23 ST JUDE MECHANICAL AVR CONDUIT AND #28 HEMASHIELD PLATINUM  GRAFT;  Surgeon: Gaye Pollack, MD;  Location: [facility];  Service: Open Heart Surgery;  Laterality: N/A;  . CARDIAC CATHETERIZATION N/A 05/21/2015   Procedure: Right/Left Heart Cath and Coronary Angiography;  Surgeon: [name], MD;  Location: [facility];  Service: Cardiovascular;  Laterality: N/A;  . CHOLECYSTECTOMY    . COLONOSCOPY  10/13/05  . DIAGNOSTIC LAPAROSCOPY    . JOINT REPLACEMENT Right 2016  . KNEE ARTHROSCOPY     rt x4  x1 lft  . MYRINGOPLASTY W/ FAT GRAFT     graft from behind ear  . SIGMOIDOSCOPY  10/13/05, 09/19/05  . TEE WITHOUT CARDIOVERSION N/A 06/15/2015   Procedure: TRANSESOPHAGEAL ECHOCARDIOGRAM (TEE);  Surgeon: Gaye Pollack, MD;  Location: [facility];  Service: Open Heart Surgery;  Laterality: N/A;  . TONSILLECTOMY  2005  . TUBAL LIGATION    . TYMPANOSTOMY TUBE PLACEMENT       Family History  Problem Relation Age of Onset  . Heart attack Father   . Cancer Mother        Type unknown  . Diabetes Other        family history  . Asthma Brother   . Hyperlipidemia Other        family history     Social History   Socioeconomic History  . Marital status: Divorced    Spouse name: Not on file  . Number of children: Not on file  . Years of education: Not on file  . Highest education level: Not on file  Occupational History  . Not on file  Tobacco Use  . Smoking status: Former Smoker    Packs/day: 0.10    Years: 30.00    Pack years: 3.00    Types: Cigarettes     Quit date: 04/29/2015    Years since quitting: 4.0  . Smokeless tobacco: Never Used  Substance and Sexual Activity  . Alcohol use: No    Alcohol/week: 0.0 standard drinks  . Drug use: No  . Sexual activity: Never  Other Topics Concern  . Not on file  Social History Narrative  . Not on file   Social Determinants of Health   Financial Resource Strain:   . Difficulty of Paying Living Expenses:   Food Insecurity:   . Worried About Charity fundraiser in the Last Year:   . Arboriculturist in the Last Year:   Transportation Needs:   . Film/video editor (Medical):   Marland Kitchen Lack of Transportation (Non-Medical):   Physical Activity:   . Days of Exercise per Week:   . Minutes of Exercise per Session:   Stress:   . Feeling of Stress :   Social Connections:   . Frequency of Communication with Friends and Family:   . Frequency of Social Gatherings with Friends and Family:   . Attends Religious Services:   . Active Member of Clubs or Organizations:   . Attends Archivist Meetings:   Marland Kitchen Marital Status:   Intimate Partner Violence:   . Fear of Current or Ex-Partner:   . Emotionally Abused:   Marland Kitchen Physically Abused:   . Sexually Abused:      BP 126/76   Pulse 80   Temp 97.8 F (36.6 C)   Ht _0  (1.6 m)   Wt 178 lb (80.7 kg)   SpO2 98%   BMI 31.53 kg/m   Physical Exam:  Well appearing NAD HEENT: Unremarkable Neck:  No JVD, no  thyromegally Lymphatics:  No adenopathy Back:  No CVA tenderness Lungs:  Clear with reduced breath sounds but no wheezes HEART:  Regular rate rhythm with mechanical S2 and a 2/6 systolic murmur. Abd:  soft, positive bowel sounds, no organomegally, no rebound, no guarding Ext:  2 plus pulses, no edema, no cyanosis, no clubbing Skin:  No rashes no nodules Neuro:  CN II through XII intact, motor grossly intact  Assess/Plan: 1. PVC's - she does not feel as well with these although she appears to have only mild palpitations. I have asked her to  avoid caffiene. She has anaphylaxis to beta blockers. She was bradycardic on cardizem. We could consider mexitil but I would like to hold on this until/unless her symptoms worsen. 2. Diastolic heart failure - As she has been more dyspneic, and her murmur seems a little louder, I have asked her to undergo repeat echo. Her echo 17 months ago demonstrated a mean gradient of 10. She is encouraged to avoid salty foods. 3. AVR - her exam today demonstrates crisp and very loud valve closure sounds. 4. Hypokalemia - she is on very high dose lasix. I wonder if the addition of aldactone might help with her potassium requirements.  [email or web address] [email or web address].

## 2019-05-17 NOTE — Patient Instructions (Signed)
Medication Instructions:  Your physician recommends that you continue on your current medications as directed. Please refer to the Current Medication list given to you today.  *If you need a refill on your cardiac medications before your next appointment, please call your pharmacy*   Lab Work: Your physician recommends that you return for lab work in: Next week   If you have labs (blood work) drawn today and your tests are completely normal, you will receive your results only by: Marland Kitchen MyChart Message (if you have MyChart) OR . A paper copy in the mail If you have any lab test that is abnormal or we need to change your treatment, we will call you to review the results.   Testing/Procedures: Your physician has requested that you have an echocardiogram. Echocardiography is a painless test that uses sound waves to create images of your heart. It provides your doctor with information about the size and shape of your heart and how well your heart's chambers and valves are working. This procedure takes approximately one hour. There are no restrictions for this procedure.     Follow-Up: At [facility], you and your health needs are our priority.  As part of our continuing mission to provide you with exceptional heart care, we have created designated Provider Care Teams.  These Care Teams include your primary Cardiologist (physician) and Advanced Practice Providers (APPs -  Physician Assistants and Nurse Practitioners) who all work together to provide you with the care you need, when you need it.  We recommend signing up for the patient portal called "MyChart".  Sign up information is provided on this After Visit Summary.  MyChart is used to connect with patients for Virtual Visits (Telemedicine).  Patients are able to view lab/test results, encounter notes, upcoming appointments, etc.  Non-urgent messages can be sent to your provider as well.   To learn more about what you can do with MyChart, go to  [email or web address].    Your next appointment:   6 month(s)  The format for your next appointment:   In Person  Provider:   Cristopher Peru, MD   Other Instructions Thank you for choosing [facility]!

## 2019-05-21 ENCOUNTER — Ambulatory Visit (HOSPITAL_COMMUNITY)
Admission: RE | Admit: 2019-05-21 | Discharge: 2019-05-21 | Disposition: A | Discharge status: Home / Self Care | Payer: Medicaid Other | Source: Ambulatory Visit | Attending: Internal Medicine | Admitting: Internal Medicine

## 2019-05-21 ENCOUNTER — Other Ambulatory Visit (HOSPITAL_COMMUNITY)
Admission: RE | Admit: 2019-05-21 | Discharge: 2019-05-21 | Disposition: A | Discharge status: Home / Self Care | Payer: Medicaid Other | Source: Ambulatory Visit | Attending: Internal Medicine | Admitting: Internal Medicine

## 2019-05-21 ENCOUNTER — Other Ambulatory Visit: Payer: Self-pay

## 2019-05-21 DIAGNOSIS — I5032 Chronic diastolic (congestive) heart failure: Secondary | ICD-10-CM | POA: Insufficient documentation

## 2019-05-21 LAB — BASIC METABOLIC PANEL
Anion gap: 12 (ref 5–15)
BUN: 37 mg/dL — ABNORMAL HIGH (ref 6–20)
CO2: 26 mmol/L (ref 22–32)
Calcium: 9 mg/dL (ref 8.9–10.3)
Chloride: 98 mmol/L (ref 98–111)
Creatinine, Ser: 1.06 mg/dL — ABNORMAL HIGH (ref 0.44–1.00)
GFR calc Af Amer: >60 mL/min (ref >60)
GFR calc non Af Amer: 58 mL/min — ABNORMAL LOW (ref >60)
Glucose, Bld: 119 mg/dL — ABNORMAL HIGH (ref 70–99)
Potassium: 3.9 mmol/L (ref 3.5–5.1)
Sodium: 136 mmol/L (ref 135–145)

## 2019-05-21 NOTE — Progress Notes (Signed)
*  PRELIMINARY RESULTS* Echocardiogram 2D Echocardiogram has been performed.  Leavy Cella 05/21/2019, 4:10 PM

## 2019-05-22 ENCOUNTER — Telehealth: Payer: Self-pay | Admitting: *Deleted

## 2019-05-22 ENCOUNTER — Ambulatory Visit: Payer: Medicaid Other | Admitting: Internal Medicine

## 2019-05-22 NOTE — Telephone Encounter (Signed)
I spoke with patient and reviewed echo results with her. She asked about potassium level and I told her this was in normal range on lab work done on 3/23. She reports she continues to have cramping and feels this is related to her potassium. She is asking what Dr Lindsay Bonilla recommends for the on going cramps.

## 2019-05-23 ENCOUNTER — Ambulatory Visit (INDEPENDENT_AMBULATORY_CARE_PROVIDER_SITE_OTHER): Payer: Medicaid Other | Admitting: *Deleted

## 2019-05-23 ENCOUNTER — Other Ambulatory Visit: Payer: Self-pay

## 2019-05-23 DIAGNOSIS — I4892 Unspecified atrial flutter: Secondary | ICD-10-CM

## 2019-05-23 DIAGNOSIS — Z5181 Encounter for therapeutic drug level monitoring: Secondary | ICD-10-CM | POA: Diagnosis not present

## 2019-05-23 LAB — POCT INR: INR: 4.5 — AB (ref 2.0–3.0)

## 2019-05-23 NOTE — Patient Instructions (Signed)
Hold warfarin tonight, take 1/2 tablet tomorrow night then resume 1 tablet daily.  Recheck in 3 weeks Had covid vaccine Thursday

## 2019-05-27 MED ORDER — SPIRONOLACTONE 25 MG PO TABS: 25.0000 mg | ORAL_TABLET | Freq: Every day | ORAL | 11 refills | Status: DC

## 2019-05-27 NOTE — Telephone Encounter (Signed)
Per Dr. Lovena Le- Have Pt start taking aldactone 25 mg one tablet by mouth daily.  Pt will need close follow up to monitor potassium.  Per Pt she is having horrible cramping everywhere and she knows it is the lasix because it only happens after she takes her lasix.  Advised Dr. Lovena Le wanted to try aldactone to see if her potassium would come up and lessen the cramping.  Pt has f/u with Dr. [name] end of April.

## 2019-06-12 ENCOUNTER — Ambulatory Visit (INDEPENDENT_AMBULATORY_CARE_PROVIDER_SITE_OTHER): Payer: Medicaid Other | Admitting: *Deleted

## 2019-06-12 ENCOUNTER — Other Ambulatory Visit: Payer: Self-pay

## 2019-06-12 DIAGNOSIS — Z5181 Encounter for therapeutic drug level monitoring: Secondary | ICD-10-CM | POA: Diagnosis not present

## 2019-06-12 DIAGNOSIS — I4892 Unspecified atrial flutter: Secondary | ICD-10-CM | POA: Diagnosis not present

## 2019-06-12 LAB — POCT INR: INR: 3.2 — AB (ref 2.0–3.0)

## 2019-06-12 NOTE — Patient Instructions (Signed)
Decrease warfarin to 1 tablet daily except 1/2 tablet on Wednesdays Recheck in 4 weeks 

## 2019-06-25 ENCOUNTER — Encounter: Payer: Self-pay | Admitting: Pulmonary Disease

## 2019-06-25 ENCOUNTER — Other Ambulatory Visit: Payer: Self-pay

## 2019-06-25 ENCOUNTER — Telehealth (HOSPITAL_COMMUNITY): Payer: Self-pay | Admitting: *Deleted

## 2019-06-25 ENCOUNTER — Ambulatory Visit: Payer: Medicaid Other | Admitting: Pulmonary Disease

## 2019-06-25 DIAGNOSIS — J432 Centrilobular emphysema: Secondary | ICD-10-CM | POA: Diagnosis not present

## 2019-06-25 DIAGNOSIS — J9611 Chronic respiratory failure with hypoxia: Secondary | ICD-10-CM | POA: Diagnosis not present

## 2019-06-25 MED ORDER — TRELEGY ELLIPTA 200-62.5-25 MCG/INH IN AEPB: 1.0000 | INHALATION_SPRAY | Freq: Every day | RESPIRATORY_TRACT | 0 refills | Status: DC

## 2019-06-25 MED ORDER — ALBUTEROL SULFATE (2.5 MG/3ML) 0.083% IN NEBU: 2.5000 mg | INHALATION_SOLUTION | Freq: Four times a day (QID) | RESPIRATORY_TRACT | 3 refills | Status: DC | PRN

## 2019-06-25 MED ORDER — TRELEGY ELLIPTA 200-62.5-25 MCG/INH IN AEPB: 1.0000 | INHALATION_SPRAY | Freq: Every day | RESPIRATORY_TRACT | 5 refills | Status: DC

## 2019-06-25 MED ORDER — ALBUTEROL SULFATE HFA 108 (90 BASE) MCG/ACT IN AERS: 1.0000 | INHALATION_SPRAY | Freq: Four times a day (QID) | RESPIRATORY_TRACT | 3 refills | Status: DC | PRN

## 2019-06-25 NOTE — Patient Instructions (Signed)
Refills on Ventolin MDI and albuterol nebs to [facility] Prior authorization for Trelegy once daily for 1 month with 5 refills, provide sample  Referral to pulmonary rehab at [facility] oxygen at 2 L during sleep and as needed daytime, maintain saturation 90% and above

## 2019-06-25 NOTE — Assessment & Plan Note (Signed)
Referral to pulmonary rehab at [facility] Continue oxygen at 2 L during sleep and as needed daytime, maintain saturation 90% and above  She has gained 30 pounds over the last 6 months and clearly deconditioning is an important factor.  Also considerable anxiety, she does not have overt edema to attribute this to heart failure but wonder if more aggressive diuresis would be helpful

## 2019-06-25 NOTE — Assessment & Plan Note (Signed)
Refills on Ventolin MDI and albuterol nebs to [facility] Prior authorization for Trelegy once daily for 1 month with 5 refills, provide sample  We discussed signs and symptoms of COPD exacerbation for which she would call us

## 2019-06-25 NOTE — Progress Notes (Signed)
Subjective:    Patient ID: Lindsay Bonilla, female    DOB: 1960-11-28, 59 y.o.   MRN: [id number]  HPI  Chief Complaint  Patient presents with  . Consult    Patient is here for COPD. She is on 2 liters oxygen all the time. Patient is on [facility] but states its not helping. Patient had samples of  Trelegy before and felt it helped more than the [facility] but Trelegy is not covered and for PA she had to try Dulera first.    59 year old ex-smoker with COPD and chronic hypoxic respiratory failure on 2 L of oxygen presents to establish care.  She is a former patient of Dr. Luan Pulling  s/p Aortic Valve Replacement/s/p Bentall/s/p AAA- underwent Bentall procedure per Dr. Cyndia Bent on 06/15/15 via replacement of ascending aortic aneurysm using a 28 mm Hemashield graft and a 23 mm St. Jude Mechanical valved graft , original AVR in 1998  I have reviewed recent hospitalization March 2021 where she signed out AMA. I have reviewed Dr. Lovena Le evaluation for bradycardia, she has a prior history of sinus node dysfunction on Cardizem.  We have reviewed last cardiology evaluation for HFpEF-she is supposed to be on Lasix 80 twice daily but only takes this once daily, compliant with potassium, Aldactone and Zaroxolyn twice a week.  In spite of this she has gained about 30 pounds since 10/2018 to her current weight of 180 pounds  She reports dyspnea on routine activities, NYHA class II-III.  She denies pedal edema, nocturnal paroxysmal dyspnea or orthopnea Some days her oxygen levels dropped to 70s and 80s in spite of oxygen and other days it stays well.  She is concerned about palpitations and states that she can feel when she is bradycardic She reports occasional wheezing but denies frequent chest colds. She has been mostly indoors during the pandemic but now is vaccinated. She smoked more than 40 pack years before she quit in 2017  Beta-blockers caused anaphylaxis   Significant tests/ events reviewed  PFTs 04/2015  >>ratio 33, FEV1 0.69 /25% , improved to 0.95/35% with BD, FVC 61%, TLC 126 %, DLCO 80  CT chest 07/2018 Coronary artery calcifications. Status post aortic valve and root graft repair, with probable reconstruction of the pulmonary outflow tract . Severe emphysema. Mild, diffuse bilateral bronchial wall thickening.  Echo 04/2019 nml LV fn, RVSP 37  03/2017 NPSG >> no OSA, min desatn 77%  Past Medical History:  Diagnosis Date  . Anxiety   . Aortic aneurysm ([place])   . Aortic insufficiency    a. s/p Bentall with mechanical AVR 4/17 >> FU Echo 6/17: Mild LVH, EF 60-65%, normal wall motion, ventricular septum with paradoxical septal motion, St. Jude mechanical AVR functioning  normally with no regurgitation (mean 6 mmHg), MAC, trivial MR, mild RVE, trivial TR  . Aortic stenosis, severe    Bicuspid valve  . Arthritis   . Asthma   . Atrial flutter ([place])   . Bitten or stung by nonvenomous insect and other nonvenomous arthropods, initial encounter   . Bronchitis   . Chronic diastolic CHF (congestive heart failure) ([place])   . Chronic respiratory failure with hypoxia ([place])   . COPD (chronic obstructive pulmonary disease) ([place])   . DDD (degenerative disc disease), cervical   . Depression   . Endocarditis, valve unspecified   . GERD (gastroesophageal reflux disease)   . Hard of hearing   . Heart failure ([place])   . Heart failure, unspecified ([place])   . Hemorrhage,  not elsewhere classified   . History of pneumonia   . 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 Hyperkalemia   . Low back pain   . Migraine headache   . Mild CAD   . Other chronic pain   . Palpitations   . PAT (paroxysmal atrial tachycardia) ([place])   . PONV (postoperative nausea and vomiting)    "only once"  . Premature atrial contractions   . PVC's (premature ventricular contractions)   . Shortness of breath    Nodule left lung CT done 8/6  . Sinus bradycardia   . Stress incontinence   . Unspecified  osteoarthritis, unspecified site   . Valvular heart disease     Past Surgical History:  Procedure Laterality Date  . ABDOMINAL HYSTERECTOMY    . ANTERIOR CERVICAL DECOMP/DISCECTOMY FUSION  10/05/2011   Procedure: ANTERIOR CERVICAL DECOMPRESSION/DISCECTOMY FUSION 2 LEVELS;  Surgeon: Floyce Stakes, MD;  Location: [facility];  Service: Neurosurgery;  Laterality: N/A;  Cervical five - six , six - seven Anterior cervical decompression/diskectomy/fusion/plate  . ANTERIOR CRUCIATE LIGAMENT REPAIR Right   . AORTIC VALVE REPLACEMENT    . ARTHROSCOPIC REPAIR ACL  rt  . BENTALL PROCEDURE N/A 06/15/2015   Procedure: BENTALL PROCEDURE; HEMI-ARCH REPAIR WITH #23 ST JUDE MECHANICAL AVR CONDUIT AND #28 HEMASHIELD PLATINUM GRAFT;  Surgeon: Gaye Pollack, MD;  Location: [facility];  Service: Open Heart Surgery;  Laterality: N/A;  . CARDIAC CATHETERIZATION N/A 05/21/2015   Procedure: Right/Left Heart Cath and Coronary Angiography;  Surgeon: [name], MD;  Location: [facility];  Service: Cardiovascular;  Laterality: N/A;  . CHOLECYSTECTOMY    . COLONOSCOPY  10/13/05  . DIAGNOSTIC LAPAROSCOPY    . JOINT REPLACEMENT Right 2016  . KNEE ARTHROSCOPY     rt x4  x1 lft  . MYRINGOPLASTY W/ FAT GRAFT     graft from behind ear  . SIGMOIDOSCOPY  10/13/05, 09/19/05  . TEE WITHOUT CARDIOVERSION N/A 06/15/2015   Procedure: TRANSESOPHAGEAL ECHOCARDIOGRAM (TEE);  Surgeon: Gaye Pollack, MD;  Location: [facility];  Service: Open Heart Surgery;  Laterality: N/A;  . TONSILLECTOMY  2005  . TUBAL LIGATION    . TYMPANOSTOMY TUBE PLACEMENT      Allergies  Allergen Reactions  . Beta Adrenergic Blockers Anaphylaxis  . Doxycycline Other (See Comments)    "Swelling, dizziness, sleepy, talking out of my head, almost caused liver failure"  . Peanut-Containing Drug Products Anaphylaxis, Shortness Of Breath, Swelling and Other (See Comments)    Swelling of the throat  . Shrimp [Shellfish Allergy] Anaphylaxis  .  Vancomycin Swelling    Rash and severe swelling  . Other     Red Meat and Dairy Products   . Avelox [Moxifloxacin Hcl In Nacl] Rash  . Ciprofloxacin Nausea And Vomiting  . Cleocin [Clindamycin Hcl] Rash  . Moxifloxacin Rash  . Sulfamethoxazole-Trimethoprim Rash  . Tape Other (See Comments)    White clear itches, rash    Social History   Socioeconomic History  . Marital status: Divorced    Spouse name: Not on file  . Number of children: Not on file  . Years of education: Not on file  . Highest education level: Not on file  Occupational History  . Not on file  Tobacco Use  . Smoking status: Former Smoker    Packs/day: 0.10    Years: 30.00    Pack years: 3.00    Types: Cigarettes  Quit date: 04/29/2015    Years since quitting: 4.1  . Smokeless tobacco: Never Used  Substance and Sexual Activity  . Alcohol use: No    Alcohol/week: 0.0 standard drinks  . Drug use: No  . Sexual activity: Never  Other Topics Concern  . Not on file  Social History Narrative  . Not on file   Social Determinants of Health   Financial Resource Strain:   . Difficulty of Paying Living Expenses:   Food Insecurity:   . Worried About Charity fundraiser in the Last Year:   . Arboriculturist in the Last Year:   Transportation Needs:   . Film/video editor (Medical):   Marland Kitchen Lack of Transportation (Non-Medical):   Physical Activity:   . Days of Exercise per Week:   . Minutes of Exercise per Session:   Stress:   . Feeling of Stress :   Social Connections:   . Frequency of Communication with Friends and Family:   . Frequency of Social Gatherings with Friends and Family:   . Attends Religious Services:   . Active Member of Clubs or Organizations:   . Attends Archivist Meetings:   Marland Kitchen Marital Status:   Intimate Partner Violence:   . Fear of Current or Ex-Partner:   . Emotionally Abused:   Marland Kitchen Physically Abused:   . Sexually Abused:     Family History  Problem Relation Age of  Onset  . Heart attack Father   . Cancer Mother        Type unknown  . Diabetes Other        family history  . Asthma Brother   . Hyperlipidemia Other        family history      Review of Systems   Constitutional: negative for anorexia, fevers and sweats  Eyes: negative for irritation, redness and visual disturbance  Ears, nose, mouth, throat, and face: negative for earaches, epistaxis, nasal congestion and sore throat  Respiratory: negative for  sputum and wheezing  Cardiovascular: negative for chest pain,  lower extremity edema, orthopnea, palpitations and syncope  Gastrointestinal: negative for abdominal pain, constipation, diarrhea, melena, nausea and vomiting  Genitourinary:negative for dysuria, frequency and hematuria  Hematologic/lymphatic: negative for bleeding, easy bruising and lymphadenopathy  Musculoskeletal:negative for arthralgias, muscle weakness and stiff joints  Neurological: negative for coordination problems, gait problems, headaches and weakness  Endocrine: negative for diabetic symptoms including polydipsia, polyuria and weight loss     Objective:   Physical Exam  Gen. Pleasant,  in no distress, anxious affect ENT - no pallor,icterus, no post nasal drip, class 2 airway Neck: No JVD, no thyromegaly, no carotid bruits Lungs: no use of accessory muscles, no dullness to percussion, decreased without rales or rhonchi  Cardiovascular: Rhythm regular, heart sounds  normal, no murmurs or gallops, no peripheral edema Abdomen: soft and non-tender, no hepatosplenomegaly, BS normal. Musculoskeletal: No deformities, no cyanosis or clubbing Neuro:  alert, non focal, no tremors       Assessment & Plan:

## 2019-06-26 ENCOUNTER — Other Ambulatory Visit: Payer: Self-pay | Admitting: Cardiology

## 2019-06-28 ENCOUNTER — Telehealth: Payer: Self-pay | Admitting: Radiology

## 2019-06-28 ENCOUNTER — Other Ambulatory Visit: Payer: Medicaid Other | Admitting: *Deleted

## 2019-06-28 ENCOUNTER — Encounter: Payer: Self-pay | Admitting: Cardiovascular Disease

## 2019-06-28 ENCOUNTER — Other Ambulatory Visit: Payer: Self-pay

## 2019-06-28 ENCOUNTER — Ambulatory Visit: Payer: Medicaid Other | Admitting: Cardiovascular Disease

## 2019-06-28 ENCOUNTER — Institutional Professional Consult (permissible substitution): Payer: Medicaid Other | Admitting: Pulmonary Disease

## 2019-06-28 VITALS — BP 120/70 | HR 42 | Ht 63.0 in | Wt 186.0 lb

## 2019-06-28 DIAGNOSIS — I493 Ventricular premature depolarization: Secondary | ICD-10-CM

## 2019-06-28 DIAGNOSIS — I712 Thoracic aortic aneurysm, without rupture, unspecified: Secondary | ICD-10-CM

## 2019-06-28 DIAGNOSIS — I5032 Chronic diastolic (congestive) heart failure: Secondary | ICD-10-CM

## 2019-06-28 DIAGNOSIS — Z952 Presence of prosthetic heart valve: Secondary | ICD-10-CM | POA: Diagnosis not present

## 2019-06-28 DIAGNOSIS — I2510 Atherosclerotic heart disease of native coronary artery without angina pectoris: Secondary | ICD-10-CM

## 2019-06-28 DIAGNOSIS — E782 Mixed hyperlipidemia: Secondary | ICD-10-CM

## 2019-06-28 MED ORDER — FUROSEMIDE 80 MG PO TABS: 80.0000 mg | ORAL_TABLET | Freq: Two times a day (BID) | ORAL | 3 refills | Status: DC

## 2019-06-28 NOTE — Patient Instructions (Signed)
Medication Instructions:  No changes today *If you need a refill on your cardiac medications before your next appointment, please call your pharmacy*   Lab Work: none If you have labs (blood work) drawn today and your tests are completely normal, you will receive your results only by: Marland Kitchen MyChart Message (if you have MyChart) OR . A paper copy in the mail If you have any lab test that is abnormal or we need to change your treatment, we will call you to review the results.   Testing/Procedures: Your physician has recommended that you wear a Zio heart monitor. Heart monitors are medical devices that record the heart's electrical activity. Doctors most often use these monitors to diagnose arrhythmias. Arrhythmias are problems with the speed or rhythm of the heartbeat. The monitor is a small, portable device. You can wear one while you do your normal daily activities. This is usually used to diagnose what is causing palpitations/syncope (passing out).   Follow-Up: At [facility], you and your health needs are our priority.  As part of our continuing mission to provide you with exceptional heart care, we have created designated Provider Care Teams.  These Care Teams include your primary Cardiologist (physician) and Advanced Practice Providers (APPs -  Physician Assistants and Nurse Practitioners) who all work together to provide you with the care you need, when you need it.   Your next appointment:   6 month(s)  The format for your next appointment:   Either In Person or Virtual  Provider:   You may see Lauree Chandler, MD or one of the following Advanced Practice Providers on your designated Care Team:    Melina Copa, PA-C  Ermalinda Barrios, PA-C    Other Instructions

## 2019-06-28 NOTE — Progress Notes (Signed)
Chief Complaint  Patient presents with  . Follow-up    Aortic Valve Disease    History of Present Illness: 59 yo female with history of bicuspid aortic valve s/p Ross procedure in 1998 with thoracic aortic aneurysm and then Bentall procedure in April 2017, COPD, GERD, anxiety, chronic diastolic CHF, tobacco abuse, PVCs, PACs here today for cardiac follow up. She has been followed for aortic valve insufficiency and thoracic aortic aneurysm and underwent Bentall procedure per Dr. Cyndia Bent on 06/15/15.  Cardiac cath March 2017 with mild CAD (20% RCA stenosis). Echo June 2017 with normally functioning mechanical aortic valve replacement, normal LV systolic function. Cardiac monitor June 2017 with PACs, PVCs. She has chronic diastolic CHF and is on Lasix. Normal ABI December 2018. Venous dopplers August 2019 with no evidence of DVT. Volume overload when seen in our office in October 2018. Metolazone added 3 times per week to her Lasix and her volume overload improved. Echo December 2019 with LVEF=60-65%, mild MR. She has issues with volume overload and is on diuretics. She did not tolerate Cardizem due to sinus node dysfunction and was seen in EP clinic. Her cardizem was stopped. Echo March 2021 with LVEF=55-60%, trivial MR, well functioning AVR. She was in the ED at [facility] March 2021 with mild volume overload and left AMA. She was seen mid March in the EP clinic by Dr. Lovena Le and he reassured her that her heart rate was truly not in the 40s as she was having PVCs. She was seen by pulmonary and was told that her heart rate being low could affect her breathing. She is now on continuous supplemental O2. She has gained 30 lbs over the past six months. LE edema comes and goes. She is taking Lasix 80 mg po BID. No chest pain. Overall feels poorly. No energy. She thinks her heart rate being low could be making her feel poorly. I reviewed the EKG from 05/14/19 which showed sinus with PVCS, heart rate 80s.   Primary Care  Physician: System, Provider Not In  Past Medical History:  Diagnosis Date  . Anxiety   . Aortic aneurysm ([place])   . Aortic insufficiency    a. s/p Bentall with mechanical AVR 4/17 >> FU Echo 6/17: Mild LVH, EF 60-65%, normal wall motion, ventricular septum with paradoxical septal motion, St. Jude mechanical AVR functioning  normally with no regurgitation (mean 6 mmHg), MAC, trivial MR, mild RVE, trivial TR  . Aortic stenosis, severe    Bicuspid valve  . Arthritis   . Asthma   . Atrial flutter ([place])   . Bitten or stung by nonvenomous insect and other nonvenomous arthropods, initial encounter   . Bronchitis   . Chronic diastolic CHF (congestive heart failure) ([place])   . Chronic respiratory failure with hypoxia ([place])   . COPD (chronic obstructive pulmonary disease) ([place])   . DDD (degenerative disc disease), cervical   . Depression   . Endocarditis, valve unspecified   . GERD (gastroesophageal reflux disease)   . Hard of hearing   . Heart failure ([place])   . Heart failure, unspecified ([place])   . Hemorrhage, not elsewhere classified   . History of pneumonia   . 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 Hyperkalemia   . Low back pain   . Migraine headache   . Mild CAD   . Other chronic pain   . Palpitations   . PAT (paroxysmal atrial tachycardia) ([place])   . PONV (  postoperative nausea and vomiting)    "only once"  . Premature atrial contractions   . PVC's (premature ventricular contractions)   . Shortness of breath    Nodule left lung CT done 8/6  . Sinus bradycardia   . Stress incontinence   . Unspecified osteoarthritis, unspecified site   . Valvular heart disease     Past Surgical History:  Procedure Laterality Date  . ABDOMINAL HYSTERECTOMY    . ANTERIOR CERVICAL DECOMP/DISCECTOMY FUSION  10/05/2011   Procedure: ANTERIOR CERVICAL DECOMPRESSION/DISCECTOMY FUSION 2 LEVELS;  Surgeon: Floyce Stakes, MD;  Location: [facility];  Service: Neurosurgery;   Laterality: N/A;  Cervical five - six , six - seven Anterior cervical decompression/diskectomy/fusion/plate  . ANTERIOR CRUCIATE LIGAMENT REPAIR Right   . AORTIC VALVE REPLACEMENT    . ARTHROSCOPIC REPAIR ACL  rt  . BENTALL PROCEDURE N/A 06/15/2015   Procedure: BENTALL PROCEDURE; HEMI-ARCH REPAIR WITH #23 ST JUDE MECHANICAL AVR CONDUIT AND #28 HEMASHIELD PLATINUM GRAFT;  Surgeon: Gaye Pollack, MD;  Location: [facility];  Service: Open Heart Surgery;  Laterality: N/A;  . CARDIAC CATHETERIZATION N/A 05/21/2015   Procedure: Right/Left Heart Cath and Coronary Angiography;  Surgeon: [name], MD;  Location: [facility];  Service: Cardiovascular;  Laterality: N/A;  . CHOLECYSTECTOMY    . COLONOSCOPY  10/13/05  . DIAGNOSTIC LAPAROSCOPY    . JOINT REPLACEMENT Right 2016  . KNEE ARTHROSCOPY     rt x4  x1 lft  . MYRINGOPLASTY W/ FAT GRAFT     graft from behind ear  . SIGMOIDOSCOPY  10/13/05, 09/19/05  . TEE WITHOUT CARDIOVERSION N/A 06/15/2015   Procedure: TRANSESOPHAGEAL ECHOCARDIOGRAM (TEE);  Surgeon: Gaye Pollack, MD;  Location: [facility];  Service: Open Heart Surgery;  Laterality: N/A;  . TONSILLECTOMY  2005  . TUBAL LIGATION    . TYMPANOSTOMY TUBE PLACEMENT      Current Outpatient Medications  Medication Sig Dispense Refill  . albuterol (PROVENTIL) (2.5 MG/3ML) 0.083% nebulizer solution Take 3 mLs (2.5 mg total) by nebulization every 6 (six) hours as needed for wheezing or shortness of breath. 75 mL 3  . albuterol (VENTOLIN HFA) 108 (90 Base) MCG/ACT inhaler Inhale 1 puff into the lungs 4 (four) times daily as needed. For shortness of breath 18 g 3  . amoxicillin (AMOXIL) 500 MG capsule TAKE 4 TABLETS BY MOUTH 30-60 MINUTES PRIOR TO DENTAL PROCEDURES 8 capsule 1  . aspirin EC 81 MG tablet Take 1 tablet (81 mg total) by mouth daily.    . busPIRone (BUSPAR) 15 MG tablet Take 15 mg by mouth 2 (two) times daily.    . cetirizine (ZYRTEC) 10 MG tablet Take 10 mg by mouth every morning.      Marland Kitchen EPINEPHrine (EPIPEN) 0.3 mg/0.3 mL IJ SOAJ injection Inject 0.3 mg into the muscle as needed. For allergic reaction    . Ferrous Gluconate (IRON 27 PO) Take 1 tablet by mouth daily.    . Fluticasone-Umeclidin-Vilant (TRELEGY ELLIPTA) 200-62.5-25 MCG/INH AEPB Inhale 1 puff into the lungs daily. 60 each 5  . Fluticasone-Umeclidin-Vilant (TRELEGY ELLIPTA) 200-62.5-25 MCG/INH AEPB Inhale 1 puff into the lungs daily. 28 each 0  . losartan (COZAAR) 25 MG tablet TAKE 1 TABLET BY MOUTH ONCE DAILY. 90 tablet 3  . magnesium oxide (MAG-OX) 400 MG tablet Take 1 tablet (400 mg total) by mouth daily. 90 tablet 3  . metolazone (ZAROXOLYN) 2.5 MG tablet TAKE 1 TABLET BY MOUTH ON MONDAY, WEDNESDAY AND FRIDAY. 12  tablet 2  . montelukast (SINGULAIR) 10 MG tablet Take 10 mg by mouth at bedtime.     . naloxegol oxalate (MOVANTIK) 25 MG TABS tablet Take 25 mg by mouth daily.    Marland Kitchen omeprazole (PRILOSEC) 40 MG capsule Take 40 mg by mouth 2 (two) times daily.     Marland Kitchen oxyCODONE-acetaminophen (PERCOCET) 10-325 MG tablet Take 1 tablet by mouth every 4 (four) hours as needed for pain.    . pravastatin (PRAVACHOL) 40 MG tablet TAKE 1 TABLET IN THE EVENING. 30 tablet 6  . spironolactone (ALDACTONE) 25 MG tablet Take 1 tablet (25 mg total) by mouth daily. 30 tablet 11  . warfarin (COUMADIN) 5 MG tablet TAKE 1 TABLET DAILY OR AS DIRECTED BY COUMADIN CLINIC. 34 tablet 6  . furosemide (LASIX) 80 MG tablet Take 1 tablet (80 mg total) by mouth 2 (two) times daily. 180 tablet 3  . potassium chloride (KLOR-CON) 10 MEQ tablet TAKE EIGHT TABLETS BY MOUTH TWICE DAILY 240 tablet 9   No current facility-administered medications for this visit.    Allergies  Allergen Reactions  . Beta Adrenergic Blockers Anaphylaxis  . Doxycycline Other (See Comments)    "Swelling, dizziness, sleepy, talking out of my head, almost caused liver failure"  . Peanut-Containing Drug Products Anaphylaxis, Shortness Of Breath, Swelling and Other (See  Comments)    Swelling of the throat  . Shrimp [Shellfish Allergy] Anaphylaxis  . Vancomycin Swelling    Rash and severe swelling  . Other     Red Meat and Dairy Products   . Avelox [Moxifloxacin Hcl In Nacl] Rash  . Ciprofloxacin Nausea And Vomiting  . Cleocin [Clindamycin Hcl] Rash  . Moxifloxacin Rash  . Sulfamethoxazole-Trimethoprim Rash  . Tape Other (See Comments)    White clear itches, rash    Social History   Socioeconomic History  . Marital status: Divorced    Spouse name: Not on file  . Number of children: Not on file  . Years of education: Not on file  . Highest education level: Not on file  Occupational History  . Not on file  Tobacco Use  . Smoking status: Former Smoker    Packs/day: 0.10    Years: 30.00    Pack years: 3.00    Types: Cigarettes    Quit date: 04/29/2015    Years since quitting: 4.1  . Smokeless tobacco: Never Used  Substance and Sexual Activity  . Alcohol use: No    Alcohol/week: 0.0 standard drinks  . Drug use: No  . Sexual activity: Never  Other Topics Concern  . Not on file  Social History Narrative  . Not on file   Social Determinants of Health   Financial Resource Strain:   . Difficulty of Paying Living Expenses:   Food Insecurity:   . Worried About Charity fundraiser in the Last Year:   . Arboriculturist in the Last Year:   Transportation Needs:   . Film/video editor (Medical):   Marland Kitchen Lack of Transportation (Non-Medical):   Physical Activity:   . Days of Exercise per Week:   . Minutes of Exercise per Session:   Stress:   . Feeling of Stress :   Social Connections:   . Frequency of Communication with Friends and Family:   . Frequency of Social Gatherings with Friends and Family:   . Attends Religious Services:   . Active Member of Clubs or Organizations:   . Attends Archivist Meetings:   .  Marital Status:   Intimate Partner Violence:   . Fear of Current or Ex-Partner:   . Emotionally Abused:   Marland Kitchen  Physically Abused:   . Sexually Abused:     Family History  Problem Relation Age of Onset  . Heart attack Father   . Cancer Mother        Type unknown  . Diabetes Other        family history  . Asthma Brother   . Hyperlipidemia Other        family history    Review of Systems:  As stated in the HPI and otherwise negative.   BP 120/70   Pulse (!) 42   Ht _0  (1.6 m)   Wt 186 lb (84.4 kg)   SpO2 98%   BMI 32.95 kg/m   Physical Examination:  General: Well developed, well nourished, NAD  HEENT: OP clear, mucus membranes moist  SKIN: warm, dry. No rashes. Neuro: No focal deficits  Musculoskeletal: Muscle strength 5/5 all ext  Psychiatric: Mood and affect normal  Neck: No JVD, no carotid bruits, no thyromegaly, no lymphadenopathy.  Lungs:Clear bilaterally, no wheezes, rhonci, crackles Cardiovascular: Regular rate and rhythm. No murmurs, gallops or rubs. Abdomen:Soft. Bowel sounds present. Non-tender.  Extremities: No lower extremity edema. Pulses are 2 + in the bilateral DP/PT.  Echo 05/19/19: 1. Left ventricular ejection fraction, by estimation, is 55 to 60%. The  left ventricle has normal function. The left ventricle demonstrates  regional wall motion abnormalities (see scoring diagram/findings for  description). There is mild left ventricular  hypertrophy. Left ventricular diastolic parameters are indeterminate.  2. Right ventricular systolic function is normal. The right ventricular  size is normal. There is mildly elevated pulmonary artery systolic  pressure.  3. Left atrial size was mildly dilated.  4. The mitral valve is normal in structure. Trivial mitral valve  regurgitation. No evidence of mitral stenosis.  5. FROM RECORDS PATIENT IS S/P HEMI-ARCH REPAIR WITH #23 ST JUDE  MECHANICAL AVR CONDUIT AND #28 HEMASHIELD PLATINUM GRAFT. The aortic valve  was not well visualized. Aortic valve regurgitation is not visualized. No  aortic stenosis is present.   6. The inferior vena cava is normal in size with greater than 50%  respiratory variability, suggesting right atrial pressure of 3 mmHg.   EKG:  EKG is  ordered today. The ekg ordered today demonstrates Sinus, PVCs with bigeminy  Recent Labs: 08/03/2018: NT-Pro BNP 494 05/13/2019: ALT 26; B Natriuretic Peptide 187.0; Hemoglobin 12.8; Magnesium 1.8; Platelets 253 05/21/2019: BUN 37; Creatinine, Ser 1.06; Potassium 3.9; Sodium 136   Lipid Panel    Component Value Date/Time   CHOL 169 01/31/2018 1049   TRIG 168 (H) 01/31/2018 1049   HDL 58 01/31/2018 1049   CHOLHDL 2.9 01/31/2018 1049   CHOLHDL 2.7 10/22/2015 0933   VLDL 19 10/22/2015 0933   LDLCALC 77 01/31/2018 1049   LDLDIRECT 79.0 03/25/2014 0947     Wt Readings from Last 3 Encounters:  06/28/19 186 lb (84.4 kg)  06/25/19 180 lb (81.6 kg)  05/17/19 178 lb (80.7 kg)     Other studies Reviewed: Additional studies/ records that were reviewed today include: . Review of the above records demonstrates:    Assessment and Plan:   1. Aortic valve disease: She is s/p mechanical AVR in 2017. Working well by echo March 2021. Will continue coumadin and SBE prophylaxis.    2. Thoracic aortic aneurysm: She is s/p Bentall procedure 2017.  3. Tobacco abuse,  in remission: She has stopped smoking.   4. Palpitations/PVCs/PACs:  She did not tolerate Cardizem due to sinus node dysfunction. She now has PVCs but does not truly have a low heart rate. The heart rate appearing low on her BP cuff is due to the device not capturing the PVCs. Will arrange a 3 day Zio cardiac monitor. If there is evidence of bradycardia, will refer back to EP.     5. HLD: Lipids in primary care. Continue statin.    6. Chronic diastolic CHF: Volume status ok. I do not think she is volume overloaded. Continue Lasix 80 mg BID. She will take extra on days she has LE edema.   7. CAD: Mild RCA plaque by cath 2017. Continue ASA and statin  8. HTN: BP is well controlled.    8. Leg pain: Normal ABI December 2018.     Current medicines are reviewed at length with the patient today.  The patient does not have concerns regarding medicines.  The following changes have been made:  no change  Labs/ tests ordered today include:   Orders Placed This Encounter  Procedures  . LONG TERM MONITOR (3-14 DAYS)  . EKG 12-Lead    Disposition:   FU with me in 6 months  Signed, Lauree Chandler, MD 06/28/2019 4:03 PM    [facility] [address], [place], [place]  [address] Phone: [phone]; Fax: [phone]

## 2019-06-28 NOTE — Telephone Encounter (Signed)
Enrolled patient for a 3 day Zio monitor to be mailed to patients home.  

## 2019-06-29 LAB — COMPREHENSIVE METABOLIC PANEL
ALT: 14 IU/L (ref 0–32)
AST: 20 IU/L (ref 0–40)
Albumin/Globulin Ratio: 2 (ref 1.2–2.2)
Albumin: 4 g/dL (ref 3.8–4.9)
Alkaline Phosphatase: 84 IU/L (ref 39–117)
BUN/Creatinine Ratio: 21 (ref 9–23)
BUN: 16 mg/dL (ref 6–24)
Bilirubin Total: 0.7 mg/dL (ref 0.0–1.2)
CO2: 27 mmol/L (ref 20–29)
Calcium: 8.4 mg/dL — ABNORMAL LOW (ref 8.7–10.2)
Chloride: 100 mmol/L (ref 96–106)
Creatinine, Ser: 0.75 mg/dL (ref 0.57–1.00)
GFR calc Af Amer: 102 mL/min/{1.73_m2} (ref >59)
GFR calc non Af Amer: 89 mL/min/{1.73_m2} (ref >59)
Globulin, Total: 2 g/dL (ref 1.5–4.5)
Glucose: 105 mg/dL — ABNORMAL HIGH (ref 65–99)
Potassium: 4.1 mmol/L (ref 3.5–5.2)
Sodium: 138 mmol/L (ref 134–144)
Total Protein: 6 g/dL (ref 6.0–8.5)

## 2019-07-01 ENCOUNTER — Telehealth: Payer: Self-pay | Admitting: *Deleted

## 2019-07-01 NOTE — Telephone Encounter (Signed)
-----   Message from Charlie Pitter, [place] sent at 07/01/2019  7:45 AM EDT ----- I think this was an order from 12/2018 OV. Should have had lipid profile with this, can we get? Also please let her know to please discuss low calcium level with primary care - please call their office to make them aware. This has been noted in the past as well. Glucose also mildly elevated. Happy belated birthday to her! Dayna Dunn PA-C

## 2019-07-01 NOTE — Telephone Encounter (Signed)
I spoke with our lab and they will send add on slip to lab to add lipid profile.  I spoke with patient and reviewed lab results with her.  She reports she was not fasting when lab work was done. I told patient we would call her with lipid results when available   Her new PCP is Dr Huel Cote at [facility]. Patient aware to follow up with PCP regarding calcium levels. CMET results routed to Dr Huel Cote. Message left on voicemail for RN clinical coordinator at [facility] in [place] of abnormal calcium level and that results had been routed to Dr Huel Cote.  Left our office number in case of questions.

## 2019-07-02 LAB — LIPID PANEL
Chol/HDL Ratio: 3.4 ratio (ref 0.0–4.4)
Cholesterol, Total: 171 mg/dL (ref 100–199)
HDL: 50 mg/dL (ref >39)
LDL Chol Calc (NIH): 87 mg/dL (ref 0–99)
Triglycerides: 205 mg/dL — ABNORMAL HIGH (ref 0–149)
VLDL Cholesterol Cal: 34 mg/dL (ref 5–40)

## 2019-07-02 LAB — SPECIMEN STATUS REPORT

## 2019-07-04 ENCOUNTER — Other Ambulatory Visit: Payer: Self-pay

## 2019-07-04 MED ORDER — POTASSIUM CHLORIDE CRYS ER 10 MEQ PO TBCR: EXTENDED_RELEASE_TABLET | ORAL | 6 refills | Status: DC

## 2019-07-04 NOTE — Telephone Encounter (Signed)
refilled potassium  

## 2019-07-08 ENCOUNTER — Telehealth: Payer: Self-pay | Admitting: Cardiovascular Disease

## 2019-07-08 NOTE — Telephone Encounter (Signed)
Patient is calling because she is getting texts from [facility] to return her monitor but she never received it to start wearing.

## 2019-07-10 ENCOUNTER — Telehealth: Payer: Self-pay | Admitting: *Deleted

## 2019-07-10 ENCOUNTER — Ambulatory Visit: Payer: Medicaid Other | Admitting: *Deleted

## 2019-07-10 ENCOUNTER — Telehealth: Payer: Self-pay | Admitting: Cardiovascular Disease

## 2019-07-10 ENCOUNTER — Other Ambulatory Visit: Payer: Self-pay

## 2019-07-10 DIAGNOSIS — Z952 Presence of prosthetic heart valve: Secondary | ICD-10-CM | POA: Diagnosis not present

## 2019-07-10 DIAGNOSIS — Z5181 Encounter for therapeutic drug level monitoring: Secondary | ICD-10-CM

## 2019-07-10 DIAGNOSIS — I4892 Unspecified atrial flutter: Secondary | ICD-10-CM | POA: Diagnosis not present

## 2019-07-10 LAB — POCT INR: INR: 1.7 — AB (ref 2.0–3.0)

## 2019-07-10 MED ORDER — METOLAZONE 2.5 MG PO TABS: ORAL_TABLET | ORAL | 11 refills | Status: DC

## 2019-07-10 NOTE — Telephone Encounter (Signed)
Pt's medication was sent to pt's pharmacy as requested. Confirmation received.  °

## 2019-07-10 NOTE — Patient Instructions (Signed)
Take warfarin 1 1/2 tablets today then increase dose to 1 tablet daily  Recheck in 4 weeks

## 2019-07-10 NOTE — Telephone Encounter (Signed)
New message   Pt c/o medication issue:  1. Name of Medication: metolazone (ZAROXOLYN) 2.5 MG tablet  2. How are you currently taking this medication (dosage and times per day)? As writtent  3. Are you having a reaction (difficulty breathing--STAT)? No   4. What is your medication issue? Patient needs a new prescription sent to [place], [place]

## 2019-07-10 NOTE — Telephone Encounter (Signed)
On 06/28/2019 patient was enrolled for a ZIO XT to be shipped to her home.  According to [facility] monitor was delivered by [employer] 07/02/2019.  Patient denies receiving package.  A second ZIO XT monitor will be shipped to the patients home [employer].  Address confirmed.

## 2019-07-10 NOTE — Telephone Encounter (Signed)
Left detail message voice mail for Lindsay Bonilla Irhythm to look into.  Awaiting return call.

## 2019-07-16 ENCOUNTER — Other Ambulatory Visit: Payer: Self-pay | Admitting: Physician Assistant

## 2019-07-16 DIAGNOSIS — E782 Mixed hyperlipidemia: Secondary | ICD-10-CM

## 2019-07-20 ENCOUNTER — Other Ambulatory Visit (INDEPENDENT_AMBULATORY_CARE_PROVIDER_SITE_OTHER): Payer: Medicaid Other

## 2019-07-20 DIAGNOSIS — I493 Ventricular premature depolarization: Secondary | ICD-10-CM

## 2019-08-07 ENCOUNTER — Other Ambulatory Visit: Payer: Self-pay

## 2019-08-07 ENCOUNTER — Ambulatory Visit: Payer: Medicaid Other | Admitting: Pharmacist

## 2019-08-07 DIAGNOSIS — Z5181 Encounter for therapeutic drug level monitoring: Secondary | ICD-10-CM

## 2019-08-07 DIAGNOSIS — I4892 Unspecified atrial flutter: Secondary | ICD-10-CM | POA: Diagnosis not present

## 2019-08-07 DIAGNOSIS — Z952 Presence of prosthetic heart valve: Secondary | ICD-10-CM

## 2019-08-07 DIAGNOSIS — I359 Nonrheumatic aortic valve disorder, unspecified: Secondary | ICD-10-CM

## 2019-08-07 LAB — POCT INR: INR: 2.7 (ref 2.0–3.0)

## 2019-08-07 NOTE — Patient Instructions (Signed)
Description   Continue taking  1 tablet daily.  Recheck in 4 weeks.      

## 2019-09-04 ENCOUNTER — Encounter (HOSPITAL_COMMUNITY): Payer: Medicaid Other

## 2019-09-04 ENCOUNTER — Ambulatory Visit: Payer: Medicaid Other | Admitting: *Deleted

## 2019-09-04 DIAGNOSIS — Z952 Presence of prosthetic heart valve: Secondary | ICD-10-CM

## 2019-09-04 DIAGNOSIS — Z5181 Encounter for therapeutic drug level monitoring: Secondary | ICD-10-CM

## 2019-09-04 DIAGNOSIS — I4892 Unspecified atrial flutter: Secondary | ICD-10-CM

## 2019-09-04 LAB — POCT INR: INR: 2.8 (ref 2.0–3.0)

## 2019-09-04 NOTE — Patient Instructions (Signed)
Continue taking 1 tablet daily.  Re-check in 4 weeks.  °

## 2019-10-02 ENCOUNTER — Ambulatory Visit: Payer: Medicaid Other | Admitting: *Deleted

## 2019-10-02 DIAGNOSIS — I4892 Unspecified atrial flutter: Secondary | ICD-10-CM | POA: Diagnosis not present

## 2019-10-02 DIAGNOSIS — Z5181 Encounter for therapeutic drug level monitoring: Secondary | ICD-10-CM | POA: Diagnosis not present

## 2019-10-02 DIAGNOSIS — Z952 Presence of prosthetic heart valve: Secondary | ICD-10-CM

## 2019-10-02 LAB — POCT INR: INR: 2.7 (ref 2.0–3.0)

## 2019-10-02 NOTE — Patient Instructions (Signed)
Continue taking 1 tablet daily  Having 2 teeth pulled on 8/18 by oral surgeon Will recheck 1-2 days before procedure to get INR at the low end.

## 2019-10-03 ENCOUNTER — Ambulatory Visit: Payer: Medicaid Other | Admitting: Pulmonary Disease

## 2019-10-03 ENCOUNTER — Other Ambulatory Visit: Payer: Self-pay

## 2019-10-03 ENCOUNTER — Encounter: Payer: Self-pay | Admitting: Pulmonary Disease

## 2019-10-03 VITALS — BP 124/80 | HR 71 | Temp 98.1°F | Ht 63.0 in | Wt 179.0 lb

## 2019-10-03 DIAGNOSIS — J432 Centrilobular emphysema: Secondary | ICD-10-CM | POA: Diagnosis not present

## 2019-10-03 DIAGNOSIS — J9611 Chronic respiratory failure with hypoxia: Secondary | ICD-10-CM

## 2019-10-03 NOTE — Assessment & Plan Note (Signed)
Continue Trelegy. Albuterol nebs as needed Enroll in pulmonary rehab Reassess PFTs, she quit smoking in 2017 and now is on optimal triple therapy

## 2019-10-03 NOTE — Assessment & Plan Note (Signed)
Enrolled in pulmonary rehab we discussed Covid precautions given the she is vaccinated now. We discussed lung transplant and what is involved. We may consider making a referral based on repeat PFTs

## 2019-10-03 NOTE — Patient Instructions (Addendum)
Stay on Trelegy Use albuterol as needed  Enroll pulmonary rehab  Schedule PFTs at [facility] We discussed lung transplant

## 2019-10-03 NOTE — Progress Notes (Signed)
   Subjective:    Patient ID: Lindsay Bonilla, female    DOB: [birth year], 59 y.o.   MRN: [id number]  HPI  59 yo ex-smoker for FU of COPD and chronic hypoxic respiratory failure on 2 L of oxygen  PMH - s/p Aortic Valve Replacement/s/p Bentall/s/p AAA-underwent Bentall procedure per Dr. Cyndia Bent on 05/2015 via replacement of ascending aortic aneurysm using a 28 mm Hemashield graft and a 23 mm St. Jude Mechanical valved graft , original AVR in 1998 - sinus node dysfunction on Cardizem - HFpEF- on Lasix   Chief Complaint  Patient presents with  . Follow-up    Patient states she is doing better since we saw her in last. States she still has some shortness of breath and is worse with exertion. Dry Cough    Trelegy helped significantly with her breathing. Albuterol MDI or nebs not help a whole lot.  She has been unable to enroll in pulmonary rehab although she has made contact with them. She has taken her Covid vaccines, daughter wants her to go out and do more and "live her life" but she is doing the right thing and avoiding crowds etc. She has questions regarding her life span   Significant tests/ events reviewed  PFTs 04/2015 >>ratio 33, FEV1 0.69 /25% , improved to 0.95/35% with BD, FVC 61%, TLC 126 %, DLCO 80  CT chest 07/2018 Coronary artery calcifications. Status post aortic valve and root graft repair, with probable reconstruction of the pulmonary outflow tract . Severe emphysema. Mild, diffuse bilateral bronchial wall thickening.  Echo 04/2019 nml LV fn, RVSP 37  03/2017 NPSG >> no OSA, min desatn 77%  Review of Systems Patient denies significant dyspnea,cough, hemoptysis,  chest pain, palpitations, pedal edema, orthopnea, paroxysmal nocturnal dyspnea, lightheadedness, nausea, vomiting, abdominal or  leg pains      Objective:   Physical Exam  Gen. Pleasant, well-nourished, in no distress ENT - no thrush, no pallor/icterus,no post nasal drip Neck: No JVD, no thyromegaly, no  carotid bruits Lungs: no use of accessory muscles, no dullness to percussion, clear without rales or rhonchi  Cardiovascular: Rhythm regular, metallic Z3GD murmurs or gallops, no peripheral edema Musculoskeletal: No deformities, no cyanosis or clubbing        Assessment & Plan:

## 2019-10-15 ENCOUNTER — Ambulatory Visit: Payer: Medicaid Other | Admitting: *Deleted

## 2019-10-15 DIAGNOSIS — Z5181 Encounter for therapeutic drug level monitoring: Secondary | ICD-10-CM

## 2019-10-15 DIAGNOSIS — Z952 Presence of prosthetic heart valve: Secondary | ICD-10-CM

## 2019-10-15 DIAGNOSIS — I4892 Unspecified atrial flutter: Secondary | ICD-10-CM | POA: Diagnosis not present

## 2019-10-15 LAB — POCT INR: INR: 2.5 (ref 2.0–3.0)

## 2019-10-15 NOTE — Patient Instructions (Signed)
Having 2 teeth extracted tomorrow by oral surgeon Take warfarin 1/2 tablet tonight then resume 1 tablet daily Recheck in 2 wks

## 2019-10-30 ENCOUNTER — Telehealth: Payer: Self-pay | Admitting: Internal Medicine

## 2019-10-30 NOTE — Telephone Encounter (Signed)
  Patient Consent for Virtual Visit         Lindsay Bonilla has provided verbal consent on 10/30/2019 for a virtual visit (video or telephone).   CONSENT FOR VIRTUAL VISIT FOR:  Lindsay Bonilla  By participating in this virtual visit I agree to the following:  I hereby voluntarily request, consent and authorize [facility] and its employed or contracted physicians, physician assistants, nurse practitioners or other licensed health care professionals (the Practitioner), to provide me with telemedicine health care services (the "Services") as deemed necessary by the treating Practitioner. I acknowledge and consent to receive the Services by the Practitioner via telemedicine. I understand that the telemedicine visit will involve communicating with the Practitioner through live audiovisual communication technology and the disclosure of certain medical information by electronic transmission. I acknowledge that I have been given the opportunity to request an in-person assessment or other available alternative prior to the telemedicine visit and am voluntarily participating in the telemedicine visit.  I understand that I have the right to withhold or withdraw my consent to the use of telemedicine in the course of my care at any time, without affecting my right to future care or treatment, and that the Practitioner or I may terminate the telemedicine visit at any time. I understand that I have the right to inspect all information obtained and/or recorded in the course of the telemedicine visit and may receive copies of available information for a reasonable fee.  I understand that some of the potential risks of receiving the Services via telemedicine include:  Marland Kitchen Delay or interruption in medical evaluation due to technological equipment failure or disruption; . Information transmitted may not be sufficient (e.g. poor resolution of images) to allow for appropriate medical decision making by the  Practitioner; and/or  . In rare instances, security protocols could fail, causing a breach of personal health information.  Furthermore, I acknowledge that it is my responsibility to provide information about my medical history, conditions and care that is complete and accurate to the best of my ability. I acknowledge that Practitioner's advice, recommendations, and/or decision may be based on factors not within their control, such as incomplete or inaccurate data provided by me or distortions of diagnostic images or specimens that may result from electronic transmissions. I understand that the practice of medicine is not an exact science and that Practitioner makes no warranties or guarantees regarding treatment outcomes. I acknowledge that a copy of this consent can be made available to me via my patient portal ([facility]), or I can request a printed copy by calling the office of [facility].    I understand that my insurance will be billed for this visit.   I have read or had this consent read to me. . I understand the contents of this consent, which adequately explains the benefits and risks of the Services being provided via telemedicine.  . I have been provided ample opportunity to ask questions regarding this consent and the Services and have had my questions answered to my satisfaction. . I give my informed consent for the services to be provided through the use of telemedicine in my medical care

## 2019-10-31 ENCOUNTER — Other Ambulatory Visit: Payer: Self-pay

## 2019-10-31 ENCOUNTER — Ambulatory Visit: Payer: Medicaid Other | Admitting: Pharmacist

## 2019-10-31 DIAGNOSIS — I359 Nonrheumatic aortic valve disorder, unspecified: Secondary | ICD-10-CM | POA: Diagnosis not present

## 2019-10-31 DIAGNOSIS — Z5181 Encounter for therapeutic drug level monitoring: Secondary | ICD-10-CM | POA: Diagnosis not present

## 2019-10-31 DIAGNOSIS — I4892 Unspecified atrial flutter: Secondary | ICD-10-CM

## 2019-10-31 DIAGNOSIS — Z952 Presence of prosthetic heart valve: Secondary | ICD-10-CM | POA: Diagnosis not present

## 2019-10-31 LAB — POCT INR: INR: 3.1 — AB (ref 2.0–3.0)

## 2019-10-31 NOTE — Patient Instructions (Signed)
Description   Increase green vegetables.  Continue warfarin 5 mg once a day and call with any questions

## 2019-11-01 ENCOUNTER — Encounter: Payer: Self-pay | Admitting: Internal Medicine

## 2019-11-01 ENCOUNTER — Telehealth (INDEPENDENT_AMBULATORY_CARE_PROVIDER_SITE_OTHER): Payer: Medicaid Other | Admitting: Internal Medicine

## 2019-11-01 VITALS — BP 130/70 | Ht 63.0 in | Wt 180.0 lb

## 2019-11-01 DIAGNOSIS — I493 Ventricular premature depolarization: Secondary | ICD-10-CM | POA: Diagnosis not present

## 2019-11-01 DIAGNOSIS — I5032 Chronic diastolic (congestive) heart failure: Secondary | ICD-10-CM | POA: Diagnosis not present

## 2019-11-01 NOTE — Patient Instructions (Signed)
Medication Instructions:  Your physician recommends that you continue on your current medications as directed. Please refer to the Current Medication list given to you today.  *If you need a refill on your cardiac medications before your next appointment, please call your pharmacy*   Lab Work: NONE   If you have labs (blood work) drawn today and your tests are completely normal, you will receive your results only by: MyChart Message (if you have MyChart) OR A paper copy in the mail If you have any lab test that is abnormal or we need to change your treatment, we will call you to review the results.   Testing/Procedures: NONE    Follow-Up: At [facility], you and your health needs are our priority.  As part of our continuing mission to provide you with exceptional heart care, we have created designated Provider Care Teams.  These Care Teams include your primary Cardiologist (physician) and Advanced Practice Providers (APPs -  Physician Assistants and Nurse Practitioners) who all work together to provide you with the care you need, when you need it.  We recommend signing up for the patient portal called "MyChart".  Sign up information is provided on this After Visit Summary.  MyChart is used to connect with patients for Virtual Visits (Telemedicine).  Patients are able to view lab/test results, encounter notes, upcoming appointments, etc.  Non-urgent messages can be sent to your provider as well.   To learn more about what you can do with MyChart, go to [email or web address].    Your next appointment:   6 month(s)  The format for your next appointment:   In Person  Provider:   Gregg Taylor, MD   Other Instructions Thank you for choosing [facility]!    

## 2019-11-01 NOTE — Progress Notes (Signed)
Electrophysiology TeleHealth Note   Due to national recommendations of social distancing due to COVID 19, an audio/video telehealth visit is felt to be most appropriate for this patient at this time.  See MyChart message from today for the patient's consent to telehealth for [facility].   Date:  11/01/2019   ID:  Lindsay Bonilla, DOB 01/11/1961, MRN [id number]  Location: patient's home  Provider location: [address] [place]  Evaluation Performed: Follow-up visit  PCP:  Leeanne Rio, MD  Cardiologist:  Lauree Chandler, MD  Electrophysiologist:  Dr Lovena Le  Chief Complaint:  "I am doing ok."  History of Present Illness:    Lindsay Bonilla is a 59 y.o. female who presents via audio/video conferencing for a telehealth visit today.  Since last being seen in our clinic, the patient reports that she still has some palpitations but not severe. She continues to have fatigue and weakness. She wore a cardiac monitor almost 3 months ago which showed PAC's and PVC's but no bradycardia. She also had some NSVT and NSSVT. She has been followed by Dr. Elsworth Soho and her bronchodilators adjusted and her symptoms are improved.   Past Medical History:  Diagnosis Date  . Anxiety   . Aortic aneurysm ([place])   . Aortic insufficiency    a. s/p [place] with mechanical AVR 4/17 >> FU Echo 6/17: Mild LVH, EF 60-65%, normal wall motion, ventricular septum with paradoxical septal motion, [employer] mechanical AVR functioning  normally with no regurgitation (mean 6 mmHg), MAC, trivial MR, mild RVE, trivial TR  . Aortic stenosis, severe    Bicuspid valve  . Arthritis   . Asthma   . Atrial flutter ([place])   . Bitten or stung by nonvenomous insect and other nonvenomous arthropods, initial encounter   . Bronchitis   . Chronic diastolic CHF (congestive heart failure) ([place])   . Chronic respiratory failure with hypoxia ([place])   . COPD (chronic obstructive pulmonary disease) ([place])   . DDD  (degenerative disc disease), cervical   . Depression   . Endocarditis, valve unspecified   . GERD (gastroesophageal reflux disease)   . Hard of hearing   . Heart failure ([place])   . Heart failure, unspecified ([place])   . Hemorrhage, not elsewhere classified   . History of pneumonia   . 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 Hyperkalemia   . Low back pain   . Migraine headache   . Mild CAD   . Other chronic pain   . Palpitations   . PAT (paroxysmal atrial tachycardia) ([place])   . PONV (postoperative nausea and vomiting)    "only once"  . Premature atrial contractions   . PVC's (premature ventricular contractions)   . Shortness of breath    Nodule left lung CT done 8/6  . Sinus bradycardia   . Stress incontinence   . Unspecified osteoarthritis, unspecified site   . Valvular heart disease     Past Surgical History:  Procedure Laterality Date  . ABDOMINAL HYSTERECTOMY    . ANTERIOR CERVICAL DECOMP/DISCECTOMY FUSION  10/05/2011   Procedure: ANTERIOR CERVICAL DECOMPRESSION/DISCECTOMY FUSION 2 LEVELS;  Surgeon: Floyce Stakes, MD;  Location: [facility];  Service: Neurosurgery;  Laterality: N/A;  Cervical five - six , six - seven Anterior cervical decompression/diskectomy/fusion/plate  . ANTERIOR CRUCIATE LIGAMENT REPAIR Right   . AORTIC VALVE REPLACEMENT    . ARTHROSCOPIC REPAIR ACL  rt  . BENTALL  PROCEDURE N/A 06/15/2015   Procedure: BENTALL PROCEDURE; HEMI-ARCH REPAIR WITH #23 ST JUDE MECHANICAL AVR CONDUIT AND #28 HEMASHIELD PLATINUM GRAFT;  Surgeon: Gaye Pollack, MD;  Location: [facility];  Service: Open Heart Surgery;  Laterality: N/A;  . CARDIAC CATHETERIZATION N/A 05/21/2015   Procedure: Right/Left Heart Cath and Coronary Angiography;  Surgeon: [name], MD;  Location: [facility];  Service: Cardiovascular;  Laterality: N/A;  . CHOLECYSTECTOMY    . COLONOSCOPY  10/13/05  . DIAGNOSTIC LAPAROSCOPY    . JOINT REPLACEMENT Right 2016  .  KNEE ARTHROSCOPY     rt x4  x1 lft  . MYRINGOPLASTY W/ FAT GRAFT     graft from behind ear  . SIGMOIDOSCOPY  10/13/05, 09/19/05  . TEE WITHOUT CARDIOVERSION N/A 06/15/2015   Procedure: TRANSESOPHAGEAL ECHOCARDIOGRAM (TEE);  Surgeon: Gaye Pollack, MD;  Location: [facility];  Service: Open Heart Surgery;  Laterality: N/A;  . TONSILLECTOMY  2005  . TUBAL LIGATION    . TYMPANOSTOMY TUBE PLACEMENT      Current Outpatient Medications  Medication Sig Dispense Refill  . albuterol (PROVENTIL) (2.5 MG/3ML) 0.083% nebulizer solution Take 3 mLs (2.5 mg total) by nebulization every 6 (six) hours as needed for wheezing or shortness of breath. 75 mL 3  . albuterol (VENTOLIN HFA) 108 (90 Base) MCG/ACT inhaler Inhale 1 puff into the lungs 4 (four) times daily as needed. For shortness of breath 18 g 3  . amoxicillin (AMOXIL) 500 MG capsule TAKE 4 TABLETS BY MOUTH 30-60 MINUTES PRIOR TO DENTAL PROCEDURES 8 capsule 1  . aspirin EC 81 MG tablet Take 1 tablet (81 mg total) by mouth daily.    . busPIRone (BUSPAR) 15 MG tablet Take 15 mg by mouth 2 (two) times daily.    . cetirizine (ZYRTEC) 10 MG tablet Take 10 mg by mouth every morning.     Marland Kitchen EPINEPHrine (EPIPEN) 0.3 mg/0.3 mL IJ SOAJ injection Inject 0.3 mg into the muscle as needed. For allergic reaction    . Ferrous Gluconate (IRON 27 PO) Take 1 tablet by mouth daily.    . Fluticasone-Umeclidin-Vilant (TRELEGY ELLIPTA) 200-62.5-25 MCG/INH AEPB Inhale 1 puff into the lungs daily. 60 each 5  . furosemide (LASIX) 80 MG tablet Take 1 tablet (80 mg total) by mouth 2 (two) times daily. 180 tablet 3  . losartan (COZAAR) 25 MG tablet TAKE 1 TABLET BY MOUTH ONCE DAILY. 90 tablet 3  . magnesium oxide (MAG-OX) 400 MG tablet Take 1 tablet (400 mg total) by mouth daily. 90 tablet 3  . metolazone (ZAROXOLYN) 2.5 MG tablet TAKE 1 TABLET BY MOUTH ON MONDAY, WEDNESDAY AND FRIDAY. 12 tablet 11  . montelukast (SINGULAIR) 10 MG tablet Take 10 mg by mouth at bedtime.     . naloxegol  oxalate (MOVANTIK) 25 MG TABS tablet Take 25 mg by mouth daily.    Marland Kitchen oxyCODONE-acetaminophen (PERCOCET) 10-325 MG tablet Take 1 tablet by mouth every 4 (four) hours as needed for pain.    . potassium chloride (KLOR-CON) 10 MEQ tablet TAKE EIGHT TABLETS BY MOUTH TWICE DAILY 480 tablet 6  . pravastatin (PRAVACHOL) 40 MG tablet TAKE 1 TABLET BY MOUTH EVERY EVENING 30 tablet 6  . spironolactone (ALDACTONE) 25 MG tablet Take 1 tablet (25 mg total) by mouth daily. 30 tablet 11  . warfarin (COUMADIN) 5 MG tablet TAKE 1 TABLET DAILY OR AS DIRECTED BY COUMADIN CLINIC. 34 tablet 6  . omeprazole (PRILOSEC) 40 MG capsule Take 40 mg  by mouth 2 (two) times daily.  (Patient not taking: Reported on 11/01/2019)     No current facility-administered medications for this visit.    Allergies:   Beta adrenergic blockers, Doxycycline, Peanut-containing drug products, Shrimp [shellfish allergy], Vancomycin, Other, Avelox [moxifloxacin hcl in nacl], Ciprofloxacin, Cleocin [clindamycin hcl], Moxifloxacin, Sulfamethoxazole-trimethoprim, and Tape   Social History:  The patient  reports that she quit smoking about 4 years ago. Her smoking use included cigarettes. She has a 3.00 pack-year smoking history. She has never used smokeless tobacco. She reports that she does not drink alcohol and does not use drugs.   Family History:  The patient's  family history includes Asthma in her brother; Cancer in her mother; Diabetes in an other family member; Heart attack in her father; Hyperlipidemia in an other family member.   ROS:  Please see the history of present illness.   All other systems are personally reviewed and negative.    Exam:    Vital Signs:  BP 130/70   Ht _0  (1.6 m)   Wt 180 lb (81.6 kg)   BMI 31.89 kg/m     Labs/Other Tests and Data Reviewed:    Recent Labs: 05/13/2019: B Natriuretic Peptide 187.0; Hemoglobin 12.8; Magnesium 1.8; Platelets 253 06/28/2019: ALT 14; BUN 16; Creatinine, Ser 0.75; Potassium  4.1; Sodium 138   Wt Readings from Last 3 Encounters:  11/01/19 180 lb (81.6 kg)  10/03/19 179 lb (81.2 kg)  06/28/19 186 lb (84.4 kg)     Other studies personally reviewed: Additional studies/ records that were reviewed today include:   Review of the above records today demonstrates: as above See details of her cardiac monitor  ASSESSMENT & PLAN:    1.  Palpitations - her symptoms remain and I suspect that she will always have some palpitations.  2. PVC's and PAC's - I tried to reassure her that these are largely benign. She has not had syncope.  3. COPD - she is improved with adjustment in her bronchodilators.    COVID 19 screen The patient denies symptoms of COVID 19 at this time.  The importance of social distancing was discussed today.  Follow-up:  12 months Next remote: n/a  Current medicines are reviewed at length with the patient today.   The patient does not have concerns regarding her medicines.  The following changes were made today:  none  Labs/ tests ordered today include: none No orders of the defined types were placed in this encounter.    Patient Risk:  after full review of this patients clinical status, I feel that they are at moderate risk at this time.  Today, I have spent 15 minutes with the patient with telehealth technology discussing all of the above .    Signed, Cristopher Peru, MD  11/01/2019 11:21 AM     [facility] [address] [place] [address] [phone] (office) [phone] (fax)

## 2019-11-18 ENCOUNTER — Ambulatory Visit: Payer: Medicaid Other | Admitting: *Deleted

## 2019-11-18 DIAGNOSIS — I4892 Unspecified atrial flutter: Secondary | ICD-10-CM

## 2019-11-18 DIAGNOSIS — Z5181 Encounter for therapeutic drug level monitoring: Secondary | ICD-10-CM

## 2019-11-18 LAB — POCT INR
INR: 3 (ref 2.0–3.0)
INR: 3 (ref 2.0–3.0)

## 2019-11-18 NOTE — Patient Instructions (Signed)
Continue warfarin 5 mg once a day  Call with any questions Continue greens Recheck in 4 weeks

## 2019-12-05 ENCOUNTER — Ambulatory Visit: Payer: Medicaid Other | Admitting: Cardiovascular Disease

## 2019-12-05 ENCOUNTER — Encounter: Payer: Self-pay | Admitting: Cardiovascular Disease

## 2019-12-05 ENCOUNTER — Other Ambulatory Visit: Payer: Self-pay

## 2019-12-05 VITALS — BP 100/70 | HR 69 | Ht 63.0 in | Wt 190.2 lb

## 2019-12-05 DIAGNOSIS — I5032 Chronic diastolic (congestive) heart failure: Secondary | ICD-10-CM | POA: Diagnosis not present

## 2019-12-05 DIAGNOSIS — E782 Mixed hyperlipidemia: Secondary | ICD-10-CM

## 2019-12-05 DIAGNOSIS — I359 Nonrheumatic aortic valve disorder, unspecified: Secondary | ICD-10-CM | POA: Diagnosis not present

## 2019-12-05 DIAGNOSIS — I493 Ventricular premature depolarization: Secondary | ICD-10-CM

## 2019-12-05 DIAGNOSIS — I10 Essential (primary) hypertension: Secondary | ICD-10-CM

## 2019-12-05 MED ORDER — POTASSIUM CHLORIDE CRYS ER 10 MEQ PO TBCR: EXTENDED_RELEASE_TABLET | ORAL | 6 refills | Status: DC

## 2019-12-05 MED ORDER — METOLAZONE 2.5 MG PO TABS: ORAL_TABLET | ORAL | 11 refills | Status: DC

## 2019-12-05 MED ORDER — SPIRONOLACTONE 25 MG PO TABS: 25.0000 mg | ORAL_TABLET | Freq: Every day | ORAL | 11 refills | Status: DC

## 2019-12-05 MED ORDER — LOSARTAN POTASSIUM 25 MG PO TABS: 25.0000 mg | ORAL_TABLET | Freq: Every day | ORAL | 11 refills | Status: DC

## 2019-12-05 MED ORDER — FUROSEMIDE 80 MG PO TABS: 80.0000 mg | ORAL_TABLET | Freq: Two times a day (BID) | ORAL | 11 refills | Status: DC

## 2019-12-05 MED ORDER — PRAVASTATIN SODIUM 40 MG PO TABS: 40.0000 mg | ORAL_TABLET | Freq: Every evening | ORAL | 3 refills | Status: DC

## 2019-12-05 NOTE — Patient Instructions (Signed)
Medication Instructions:  Your physician recommends that you continue on your current medications as directed. Please refer to the Current Medication list given to you today.  *If you need a refill on your cardiac medications before your next appointment, please call your pharmacy*   Follow-Up: At [facility], you and your health needs are our priority.  As part of our continuing mission to provide you with exceptional heart care, we have created designated Provider Care Teams.  These Care Teams include your primary Cardiologist (physician) and Advanced Practice Providers (APPs -  Physician Assistants and Nurse Practitioners) who all work together to provide you with the care you need, when you need it.  Your next appointment:   6 month(s)  The format for your next appointment:   In Person  Provider:   You may see Christopher McAlhany, MD or one of the following Advanced Practice Providers on your designated Care Team:    Dayna Dunn, PA-C  Michele Lenze, PA-C   

## 2019-12-05 NOTE — Progress Notes (Signed)
Chief Complaint  Patient presents with  . Follow-up    aortic valve disease   History of Present Illness: 59 yo female with history of bicuspid aortic valve s/p Ross procedure in 1998 with thoracic aortic aneurysm and then Bentall procedure in April 2017, COPD, GERD, anxiety, chronic diastolic CHF, COPD, tobacco abuse, PVCs, PACs here today for cardiac follow up. She has been followed for aortic valve insufficiency and thoracic aortic aneurysm and underwent Bentall procedure per Dr. Cyndia Bent on 06/15/15.  Cardiac cath March 2017 with mild CAD (20% RCA stenosis). Echo June 2017 with normally functioning mechanical aortic valve replacement, normal LV systolic function. Cardiac monitor June 2017 with PACs, PVCs. She has chronic diastolic CHF and is on Lasix. Normal ABI December 2018. Venous dopplers August 2019 with no evidence of DVT. Volume overload when seen in our office in October 2018. Metolazone added 3 times per week to her Lasix and her volume overload improved. Echo December 2019 with LVEF=60-65%, mild MR. She did not tolerate Cardizem due to sinus node dysfunction and was seen in EP clinic. Her cardizem was stopped. Echo March 2021 with LVEF=55-60%, trivial MR, well functioning AVR. She was in the ED [facility] March 2021 with mild volume overload and left AMA. She was seen mid March in the EP clinic by Dr. Lovena Le and he reassured her that her heart rate was truly not in the 40s as she was having PVCs. She is followed in the pulmonary office for severe COPD and is now on supplemental O2 at night. She has been on Lasix.   She is here today for follow up. The patient denies any chest pain, palpitations, lower extremity edema, orthopnea, PND, dizziness, near syncope or syncope. She still has dyspnea with exertion. She has been told by Dr. Elsworth Soho that her COPD is now severe.   Primary Care Physician: Leeanne Rio, MD  Past Medical History:  Diagnosis Date  . Anxiety   . Aortic aneurysm ([place])    . Aortic insufficiency    a. s/p Bentall with mechanical AVR 4/17 >> FU Echo 6/17: Mild LVH, EF 60-65%, normal wall motion, ventricular septum with paradoxical septal motion, St. Jude mechanical AVR functioning  normally with no regurgitation (mean 6 mmHg), MAC, trivial MR, mild RVE, trivial TR  . Aortic stenosis, severe    Bicuspid valve  . Arthritis   . Asthma   . Atrial flutter ([place])   . Bitten or stung by nonvenomous insect and other nonvenomous arthropods, initial encounter   . Bronchitis   . Chronic diastolic CHF (congestive heart failure) ([place])   . Chronic respiratory failure with hypoxia ([place])   . COPD (chronic obstructive pulmonary disease) ([place])   . DDD (degenerative disc disease), cervical   . Depression   . Endocarditis, valve unspecified   . GERD (gastroesophageal reflux disease)   . Hard of hearing   . Heart failure ([place])   . Heart failure, unspecified ([place])   . Hemorrhage, not elsewhere classified   . History of pneumonia   . 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 Hyperkalemia   . Low back pain   . Migraine headache   . Mild CAD   . Other chronic pain   . Palpitations   . PAT (paroxysmal atrial tachycardia) ([place])   . PONV (postoperative nausea and vomiting)    "only once"  . Premature atrial contractions   . PVC's (premature ventricular contractions)   . Shortness  of breath    Nodule left lung CT done 8/6  . Sinus bradycardia   . Stress incontinence   . Unspecified osteoarthritis, unspecified site   . Valvular heart disease     Past Surgical History:  Procedure Laterality Date  . ABDOMINAL HYSTERECTOMY    . ANTERIOR CERVICAL DECOMP/DISCECTOMY FUSION  10/05/2011   Procedure: ANTERIOR CERVICAL DECOMPRESSION/DISCECTOMY FUSION 2 LEVELS;  Surgeon: Floyce Stakes, MD;  Location: [facility];  Service: Neurosurgery;  Laterality: N/A;  Cervical five - six , six - seven Anterior cervical decompression/diskectomy/fusion/plate  .  ANTERIOR CRUCIATE LIGAMENT REPAIR Right   . AORTIC VALVE REPLACEMENT    . ARTHROSCOPIC REPAIR ACL  rt  . BENTALL PROCEDURE N/A 06/15/2015   Procedure: BENTALL PROCEDURE; HEMI-ARCH REPAIR WITH #23 ST JUDE MECHANICAL AVR CONDUIT AND #28 HEMASHIELD PLATINUM GRAFT;  Surgeon: Gaye Pollack, MD;  Location: [facility];  Service: Open Heart Surgery;  Laterality: N/A;  . CARDIAC CATHETERIZATION N/A 05/21/2015   Procedure: Right/Left Heart Cath and Coronary Angiography;  Surgeon: [name], MD;  Location: [facility];  Service: Cardiovascular;  Laterality: N/A;  . CHOLECYSTECTOMY    . COLONOSCOPY  10/13/05  . DIAGNOSTIC LAPAROSCOPY    . JOINT REPLACEMENT Right 2016  . KNEE ARTHROSCOPY     rt x4  x1 lft  . MYRINGOPLASTY W/ FAT GRAFT     graft from behind ear  . SIGMOIDOSCOPY  10/13/05, 09/19/05  . TEE WITHOUT CARDIOVERSION N/A 06/15/2015   Procedure: TRANSESOPHAGEAL ECHOCARDIOGRAM (TEE);  Surgeon: Gaye Pollack, MD;  Location: [facility];  Service: Open Heart Surgery;  Laterality: N/A;  . TONSILLECTOMY  2005  . TUBAL LIGATION    . TYMPANOSTOMY TUBE PLACEMENT      Current Outpatient Medications  Medication Sig Dispense Refill  . albuterol (PROVENTIL) (2.5 MG/3ML) 0.083% nebulizer solution Take 3 mLs (2.5 mg total) by nebulization every 6 (six) hours as needed for wheezing or shortness of breath. 75 mL 3  . albuterol (VENTOLIN HFA) 108 (90 Base) MCG/ACT inhaler Inhale 1 puff into the lungs 4 (four) times daily as needed. For shortness of breath 18 g 3  . amoxicillin (AMOXIL) 500 MG capsule TAKE 4 TABLETS BY MOUTH 30-60 MINUTES PRIOR TO DENTAL PROCEDURES 8 capsule 1  . aspirin EC 81 MG tablet Take 1 tablet (81 mg total) by mouth daily.    . cetirizine (ZYRTEC) 10 MG tablet Take 10 mg by mouth every morning.     Marland Kitchen EPINEPHrine (EPIPEN) 0.3 mg/0.3 mL IJ SOAJ injection Inject 0.3 mg into the muscle as needed. For allergic reaction    . Ferrous Gluconate (IRON 27 PO) Take 1 tablet by mouth daily.     . Fluticasone-Umeclidin-Vilant (TRELEGY ELLIPTA) 200-62.5-25 MCG/INH AEPB Inhale 1 puff into the lungs daily. 60 each 5  . furosemide (LASIX) 80 MG tablet Take 1 tablet (80 mg total) by mouth 2 (two) times daily. 60 tablet 11  . losartan (COZAAR) 25 MG tablet Take 1 tablet (25 mg total) by mouth daily. 30 tablet 11  . magnesium oxide (MAG-OX) 400 MG tablet Take 1 tablet (400 mg total) by mouth daily. 90 tablet 3  . metolazone (ZAROXOLYN) 2.5 MG tablet TAKE 1 TABLET BY MOUTH ON MONDAY, WEDNESDAY AND FRIDAY. 12 tablet 11  . montelukast (SINGULAIR) 10 MG tablet Take 10 mg by mouth at bedtime.     . naloxegol oxalate (MOVANTIK) 25 MG TABS tablet Take 25 mg by mouth daily.    Marland Kitchen  omeprazole (PRILOSEC) 40 MG capsule Take 40 mg by mouth 2 (two) times daily.     Marland Kitchen oxyCODONE-acetaminophen (PERCOCET) 10-325 MG tablet Take 1 tablet by mouth every 4 (four) hours as needed for pain.    . potassium chloride (KLOR-CON) 10 MEQ tablet TAKE EIGHT TABLETS BY MOUTH TWICE DAILY 480 tablet 6  . pravastatin (PRAVACHOL) 40 MG tablet Take 1 tablet (40 mg total) by mouth every evening. 90 tablet 3  . warfarin (COUMADIN) 5 MG tablet TAKE 1 TABLET DAILY OR AS DIRECTED BY COUMADIN CLINIC. 34 tablet 6  . spironolactone (ALDACTONE) 25 MG tablet Take 1 tablet (25 mg total) by mouth daily. 30 tablet 11   No current facility-administered medications for this visit.    Allergies  Allergen Reactions  . Beta Adrenergic Blockers Anaphylaxis  . Doxycycline Other (See Comments)    "Swelling, dizziness, sleepy, talking out of my head, almost caused liver failure"  . Peanut-Containing Drug Products Anaphylaxis, Shortness Of Breath, Swelling and Other (See Comments)    Swelling of the throat  . Shrimp [Shellfish Allergy] Anaphylaxis  . Vancomycin Swelling    Rash and severe swelling  . Other     Red Meat and Dairy Products   . Avelox [Moxifloxacin Hcl In Nacl] Rash  . Ciprofloxacin Nausea And Vomiting  . Cleocin [Clindamycin  Hcl] Rash  . Moxifloxacin Rash  . Sulfamethoxazole-Trimethoprim Rash  . Tape Other (See Comments)    White clear itches, rash    Social History   Socioeconomic History  . Marital status: Divorced    Spouse name: Not on file  . Number of children: Not on file  . Years of education: Not on file  . Highest education level: Not on file  Occupational History  . Not on file  Tobacco Use  . Smoking status: Former Smoker    Packs/day: 0.10    Years: 30.00    Pack years: 3.00    Types: Cigarettes    Quit date: 04/29/2015    Years since quitting: 4.6  . Smokeless tobacco: Never Used  Vaping Use  . Vaping Use: Never used  Substance and Sexual Activity  . Alcohol use: No    Alcohol/week: 0.0 standard drinks  . Drug use: No  . Sexual activity: Never  Other Topics Concern  . Not on file  Social History Narrative  . Not on file   Social Determinants of Health   Financial Resource Strain:   . Difficulty of Paying Living Expenses: Not on file  Food Insecurity:   . Worried About Charity fundraiser in the Last Year: Not on file  . Ran Out of Food in the Last Year: Not on file  Transportation Needs:   . Lack of Transportation (Medical): Not on file  . Lack of Transportation (Non-Medical): Not on file  Physical Activity:   . Days of Exercise per Week: Not on file  . Minutes of Exercise per Session: Not on file  Stress:   . Feeling of Stress : Not on file  Social Connections:   . Frequency of Communication with Friends and Family: Not on file  . Frequency of Social Gatherings with Friends and Family: Not on file  . Attends Religious Services: Not on file  . Active Member of Clubs or Organizations: Not on file  . Attends Archivist Meetings: Not on file  . Marital Status: Not on file  Intimate Partner Violence:   . Fear of Current or Ex-Partner: Not on  file  . Emotionally Abused: Not on file  . Physically Abused: Not on file  . Sexually Abused: Not on file     Family History  Problem Relation Age of Onset  . Heart attack Father   . Cancer Mother        Type unknown  . Diabetes Other        family history  . Asthma Brother   . Hyperlipidemia Other        family history    Review of Systems:  As stated in the HPI and otherwise negative.   BP 100/70   Pulse 69   Ht _0  (1.6 m)   Wt 190 lb 3.2 oz (86.3 kg)   SpO2 96%   BMI 33.69 kg/m   Physical Examination:  General: Well developed, well nourished, NAD  HEENT: OP clear, mucus membranes moist  SKIN: warm, dry. No rashes. Neuro: No focal deficits  Musculoskeletal: Muscle strength 5/5 all ext  Psychiatric: Mood and affect normal  Neck: No JVD, no carotid bruits, no thyromegaly, no lymphadenopathy.  Lungs:Clear bilaterally, no wheezes, rhonci, crackles Cardiovascular: Regular rate and rhythm. No murmurs, gallops or rubs. Abdomen:Soft. Bowel sounds present. Non-tender.  Extremities: No lower extremity edema. Pulses are 2 + in the bilateral DP/PT.  Echo 05/19/19: 1. Left ventricular ejection fraction, by estimation, is 55 to 60%. The  left ventricle has normal function. The left ventricle demonstrates  regional wall motion abnormalities (see scoring diagram/findings for  description). There is mild left ventricular  hypertrophy. Left ventricular diastolic parameters are indeterminate.  2. Right ventricular systolic function is normal. The right ventricular  size is normal. There is mildly elevated pulmonary artery systolic  pressure.  3. Left atrial size was mildly dilated.  4. The mitral valve is normal in structure. Trivial mitral valve  regurgitation. No evidence of mitral stenosis.  5. FROM RECORDS PATIENT IS S/P HEMI-ARCH REPAIR WITH #23 ST JUDE  MECHANICAL AVR CONDUIT AND #28 HEMASHIELD PLATINUM GRAFT. The aortic valve  was not well visualized. Aortic valve regurgitation is not visualized. No  aortic stenosis is present.  6. The inferior vena cava is normal in  size with greater than 50%  respiratory variability, suggesting right atrial pressure of 3 mmHg.   EKG:  EKG is not ordered today. The ekg ordered today demonstrates  Recent Labs: 05/13/2019: B Natriuretic Peptide 187.0; Hemoglobin 12.8; Magnesium 1.8; Platelets 253 06/28/2019: ALT 14; BUN 16; Creatinine, Ser 0.75; Potassium 4.1; Sodium 138   Lipid Panel    Component Value Date/Time   CHOL 171 06/28/2019 0000   TRIG 205 (H) 06/28/2019 0000   HDL 50 06/28/2019 0000   CHOLHDL 3.4 06/28/2019 0000   CHOLHDL 2.7 10/22/2015 0933   VLDL 19 10/22/2015 0933   LDLCALC 87 06/28/2019 0000   LDLDIRECT 79.0 03/25/2014 0947     Wt Readings from Last 3 Encounters:  12/05/19 190 lb 3.2 oz (86.3 kg)  11/01/19 180 lb (81.6 kg)  10/03/19 179 lb (81.2 kg)     Other studies Reviewed: Additional studies/ records that were reviewed today include: . Review of the above records demonstrates:    Assessment and Plan:   1. Aortic valve disease: She is s/p mechanical AVR in 2017. Working well by echo March 2021. Continue coumadin and SBE prophylaxis.    2. Thoracic aortic aneurysm: She is s/p Bentall procedure 2017.  3. Tobacco abuse, in remission: She has stopped smoking.   4. Palpitations/PVCs/PACs:  She did not tolerate  Cardizem due to sinus node dysfunction. Cardiac monitor in June 2021 with sinus with no bradycardia but with known PVCs, PACs and several short runs of SVT and VT.      5. HLD: Lipids followed in primary care. Will continue statin.     6. Chronic diastolic CHF: Weight is stable. No volume overload on exam. Continue Lasix.   7. CAD: Mild RCA plaque by cath 2017. Continue ASA and statin.   8. HTN: BP is well controlled. No changes  8. Leg pain: Normal ABI December 2018.     Current medicines are reviewed at length with the patient today.  The patient does not have concerns regarding medicines.  The following changes have been made:  no change  Labs/ tests ordered today  include:   No orders of the defined types were placed in this encounter.   Disposition:   FU with me in 6 months  Signed, Lauree Chandler, MD 12/05/2019 4:41 PM    [facility] [place], [place], [place]  [address] Phone: [phone]; Fax: [phone]

## 2019-12-12 NOTE — Progress Notes (Addendum)
Called patient twice and got her voicemail. The 1st time I asked her to call me back if she could change her appt. The 2nd time I called I asked her to call Hoyle Sauer in the morning to change her appt. We have a scheduling conflict. Will check to see if appt. Was changed tomorrow. If not I'll try calling her again. Leaving encounter open. I was unable to reach patient, left another voicemail, for patient to call me and reschedule her appointment. I also told her if she couldn't come in today, to please call Dr. Bari Mantis office and have them reschedule her procedure. Closing encounter.

## 2019-12-13 ENCOUNTER — Other Ambulatory Visit (HOSPITAL_COMMUNITY)
Admission: RE | Admit: 2019-12-13 | Discharge: 2019-12-13 | Disposition: A | Discharge status: Home / Self Care | Payer: Medicaid Other | Source: Ambulatory Visit | Attending: Pulmonary Disease | Admitting: Pulmonary Disease

## 2019-12-16 ENCOUNTER — Ambulatory Visit: Payer: Medicaid Other | Admitting: *Deleted

## 2019-12-16 DIAGNOSIS — I4892 Unspecified atrial flutter: Secondary | ICD-10-CM | POA: Diagnosis not present

## 2019-12-16 DIAGNOSIS — Z5181 Encounter for therapeutic drug level monitoring: Secondary | ICD-10-CM | POA: Diagnosis not present

## 2019-12-16 LAB — POCT INR: INR: 3.3 — AB (ref 2.0–3.0)

## 2019-12-16 NOTE — Patient Instructions (Signed)
Hold warfarin tonight then decrease dose to 1 tablet daily except  1/2 tablet on Mondays Call with any questions Continue greens Recheck in 3 weeks

## 2019-12-17 ENCOUNTER — Telehealth: Payer: Self-pay | Admitting: Pulmonary Disease

## 2019-12-17 ENCOUNTER — Encounter (HOSPITAL_COMMUNITY): Payer: Medicaid Other

## 2019-12-17 NOTE — Telephone Encounter (Signed)
ATC patient to verify that she wants to cancel PFT for today [facility]  Called and spoke with Lynnae Sandhoff who states that patient called yesterday and stated that she was not going to be able to make her PFT appointment for 12/17/2019 at [facility]. To let them know that patient is canceling appointment for today. They stated that Patient can call back to reschedule but it will be December before they can get her in. Will let patient know if she calls back. She can also call [phone] to reschedule.

## 2019-12-20 NOTE — Telephone Encounter (Signed)
Lm x2 for patient. Letter has been mailed to address on file per office protocol.   

## 2019-12-24 ENCOUNTER — Telehealth: Payer: Self-pay

## 2019-12-24 MED ORDER — TRELEGY ELLIPTA 200-62.5-25 MCG/INH IN AEPB: 1.0000 | INHALATION_SPRAY | Freq: Every day | RESPIRATORY_TRACT | 5 refills | Status: DC

## 2019-12-24 NOTE — Telephone Encounter (Signed)
Called and spoke with patient to let her know that I would send in refill for her Trelegy inhaler. Her last OV was 10/03/19 and is scheduled for follow up on 02/17/20. Provided patient with the number for the Respiratory Dept for when she wants to reschedule her PFT at [facility]. She states that the only reason she canceled it is because she is scared to go into the hospital. Expressed understanding. RX sent to pharmacy. Nothing further needed at this time.

## 2019-12-24 NOTE — Telephone Encounter (Signed)
Pt calling fussing because she is not understanding why she needs an appointment for refill on her trelegy when she seen RA on 10/03/19 in [place] and she stated that she was not due back for another appointment for 6 months, she stated that she can not come to [facility] for an appointment. Pharmacy is [facility] in [place]. Pt can be reached at [phone].

## 2019-12-24 NOTE — Telephone Encounter (Signed)
Pt was left vm to call schedule f/u with either Dr Elsworth Soho or nurse practioner will be happy to send in refill for trelegy 200/62.5/25

## 2020-01-06 ENCOUNTER — Telehealth: Payer: Self-pay

## 2020-01-06 NOTE — Telephone Encounter (Signed)
lmom to r/s appt due to lisa reid coumadin rn out sick

## 2020-01-09 ENCOUNTER — Ambulatory Visit: Payer: Medicaid Other | Admitting: *Deleted

## 2020-01-09 DIAGNOSIS — Z5181 Encounter for therapeutic drug level monitoring: Secondary | ICD-10-CM | POA: Diagnosis not present

## 2020-01-09 DIAGNOSIS — I4892 Unspecified atrial flutter: Secondary | ICD-10-CM

## 2020-01-09 LAB — POCT INR: INR: 3.2 — AB (ref 2.0–3.0)

## 2020-01-09 NOTE — Patient Instructions (Signed)
Decrease warfarin to 1 tablet daily except 1/2 tablet on Mondays and Thursdays Call with any questions Continue greens Recheck in 3 weeks

## 2020-01-30 ENCOUNTER — Ambulatory Visit: Payer: Medicaid Other | Admitting: *Deleted

## 2020-01-30 DIAGNOSIS — I4892 Unspecified atrial flutter: Secondary | ICD-10-CM | POA: Diagnosis not present

## 2020-01-30 DIAGNOSIS — Z952 Presence of prosthetic heart valve: Secondary | ICD-10-CM | POA: Diagnosis not present

## 2020-01-30 DIAGNOSIS — Z5181 Encounter for therapeutic drug level monitoring: Secondary | ICD-10-CM | POA: Diagnosis not present

## 2020-01-30 LAB — POCT INR: INR: 2.3 (ref 2.0–3.0)

## 2020-01-30 NOTE — Patient Instructions (Signed)
Continue warfarin 1 tablet daily except 1/2 tablet on Mondays and Thursdays Call with any questions Continue greens Recheck in 4 weeks

## 2020-02-17 ENCOUNTER — Ambulatory Visit: Payer: Medicaid Other | Admitting: Pulmonary Disease

## 2020-02-17 ENCOUNTER — Encounter: Payer: Self-pay | Admitting: Pulmonary Disease

## 2020-02-17 ENCOUNTER — Other Ambulatory Visit: Payer: Self-pay

## 2020-02-17 VITALS — BP 112/60 | HR 45 | Temp 97.1°F | Ht 63.0 in | Wt 192.0 lb

## 2020-02-17 DIAGNOSIS — R002 Palpitations: Secondary | ICD-10-CM

## 2020-02-17 DIAGNOSIS — J432 Centrilobular emphysema: Secondary | ICD-10-CM | POA: Diagnosis not present

## 2020-02-17 MED ORDER — BREZTRI AEROSPHERE 160-9-4.8 MCG/ACT IN AERO: 2.0000 | INHALATION_SPRAY | Freq: Two times a day (BID) | RESPIRATORY_TRACT | 0 refills | Status: DC

## 2020-02-17 MED ORDER — MONTELUKAST SODIUM 10 MG PO TABS
10.0000 mg | ORAL_TABLET | Freq: Every day | ORAL | 3 refills | Status: DC
Start: 2020-02-17 — End: 2023-02-02

## 2020-02-17 MED ORDER — ALBUTEROL SULFATE HFA 108 (90 BASE) MCG/ACT IN AERS: 1.0000 | INHALATION_SPRAY | Freq: Four times a day (QID) | RESPIRATORY_TRACT | 3 refills | Status: DC | PRN

## 2020-02-17 NOTE — Patient Instructions (Signed)
Sample of Breztri Refills  on ventolin HFA    Schedule pFts @ [place] office at [facility]

## 2020-02-17 NOTE — Assessment & Plan Note (Signed)
Frequent PVCs noted on prior EKG, she has follow-up with EP

## 2020-02-17 NOTE — Assessment & Plan Note (Signed)
We will give her samples of Breztri to try instead of Trelegy and see if this works better. We will provide her refills on Ventolin HFA and Singulair. She is afraid to go to the hospital to schedule PFTs so we will schedule this in our office at [facility]

## 2020-02-17 NOTE — Progress Notes (Signed)
   Subjective:    Patient ID: Lindsay Bonilla, female    DOB: 21-Oct-1960, 59 y.o.   MRN: [id number]  HPI  59 yo ex-smoker for FU of COPD and chronic hypoxic respiratory failure on 2 L of oxygen  PMH - s/p Aortic Valve Replacement/s/p Bentall/s/p AAA-underwent Bentall procedure per Dr. Cyndia Bent on 05/2015 via replacement of ascending aortic aneurysm using a 28 mm Hemashield graft and a 23 mm St. Jude Mechanical valved graft,original AVR in 1998 - sinus node dysfunction on Cardizem - HFpEF- on Lasix   Chief Complaint  Patient presents with  . Follow-up    Productive cough with yellowish/clear phlegm   She continues to complain of shortness of breath, NYHA class II-III symptoms. She feels like Ventolin works better for her than ProAir. She does not feel like Trelegy is doing much for her, she does not like the feeling of the powder she wonders if she can try different medication.  Last visit we discussed transplant and requested PFTs, she has not obtained this because she is "afraid to go to the hospital."  She is going to stay away from her family during Christmas because some of them are unvaccinated.  She is bradycardic today, reviewed prior cardiology office visits where this has been noted, she follows with Dr. Lovena Le from EP, prior EKGs have shown frequent PVCs, Cardizem has been discontinued in the past   Significant tests/ events reviewed  PFTs 04/2015 >>ratio 33, FEV1 0.69 /25% , improved to 0.95/35% with BD, FVC 61%, TLC 126 %, DLCO 80  CT chest 6/2020Coronary artery calcifications. Status post aortic valve and root graft repair, with probable reconstruction of the pulmonary outflow tract.Severe emphysema. Mild, diffuse bilateral bronchial wall thickening.  Echo 04/2019 nml LV fn, RVSP 37  03/2017 NPSG >> no OSA, min desatn 77%  Review of Systems neg for any significant sore throat, dysphagia, itching, sneezing, nasal congestion or excess/ purulent secretions, fever,  chills, sweats, unintended wt loss, pleuritic or exertional cp, hempoptysis, orthopnea pnd or change in chronic leg swelling. Also denies presyncope, palpitations, heartburn, abdominal pain, nausea, vomiting, diarrhea or change in bowel or urinary habits, dysuria,hematuria, rash, arthralgias, visual complaints, headache, numbness weakness or ataxia.     Objective:   Physical Exam  Gen. Pleasant, well-nourished, in no distress, anxious affect ENT - no thrush, no pallor/icterus,no post nasal drip Neck: No JVD, no thyromegaly, no carotid bruits Lungs: no use of accessory muscles, no dullness to percussion, clear without rales or rhonchi  Cardiovascular: Rhythm irregular, heart sounds  normal, no murmurs or gallops, no peripheral edema Musculoskeletal: No deformities, no cyanosis or clubbing         Assessment & Plan:

## 2020-03-05 ENCOUNTER — Telehealth: Payer: Self-pay | Admitting: Pulmonary Disease

## 2020-03-06 NOTE — Telephone Encounter (Signed)
LMTCB for the pt 

## 2020-03-08 ENCOUNTER — Other Ambulatory Visit: Payer: Self-pay | Admitting: Internal Medicine

## 2020-03-08 DIAGNOSIS — E782 Mixed hyperlipidemia: Secondary | ICD-10-CM

## 2020-03-09 ENCOUNTER — Other Ambulatory Visit: Payer: Self-pay

## 2020-03-09 MED ORDER — BREZTRI AEROSPHERE 160-9-4.8 MCG/ACT IN AERO: 2.0000 | INHALATION_SPRAY | Freq: Two times a day (BID) | RESPIRATORY_TRACT | 0 refills | Status: DC

## 2020-03-09 NOTE — Telephone Encounter (Signed)
Patient stopped in in-person to get Breztri samples. Patient stated that the Judithann Sauger is working well for her and would like script sent to pharmacy for it. Two samples provided to hold over until script sent. Dr Elsworth Soho is this ok to send order for [facility] in to pharmacy?

## 2020-03-10 ENCOUNTER — Other Ambulatory Visit: Payer: Self-pay

## 2020-03-10 MED ORDER — BREZTRI AEROSPHERE 160-9-4.8 MCG/ACT IN AERO: 2.0000 | INHALATION_SPRAY | Freq: Two times a day (BID) | RESPIRATORY_TRACT | 5 refills | Status: DC

## 2020-03-10 NOTE — Telephone Encounter (Signed)
Sent in script for Judithann Sauger to patient pharmacy per verbal order from Dr Elsworth Soho. Writer discussed this with patient in person yesterday and patient stated she was ok with sending script in to pharmacy once Dr Elsworth Soho ok'd it. Order sent per Dr Elsworth Soho. Called and left message on voicemail (per DPR) that script was sent. Advised to call back if patient had any questions. Nothing further needed at this time.

## 2020-03-10 NOTE — Progress Notes (Signed)
b re

## 2020-03-10 NOTE — Telephone Encounter (Signed)
Okay to send prescription for 2 puffs twice daily with 5 refills

## 2020-03-11 ENCOUNTER — Ambulatory Visit (INDEPENDENT_AMBULATORY_CARE_PROVIDER_SITE_OTHER): Payer: Medicaid Other | Admitting: *Deleted

## 2020-03-11 DIAGNOSIS — Z5181 Encounter for therapeutic drug level monitoring: Secondary | ICD-10-CM

## 2020-03-11 DIAGNOSIS — Z952 Presence of prosthetic heart valve: Secondary | ICD-10-CM | POA: Diagnosis not present

## 2020-03-11 DIAGNOSIS — I4892 Unspecified atrial flutter: Secondary | ICD-10-CM

## 2020-03-11 LAB — POCT INR: INR: 1.7 — AB (ref 2.0–3.0)

## 2020-03-11 NOTE — Patient Instructions (Signed)
Take warfarin 1 1/2 tablets tonight, 1 tablet tomorrow night then resume 1 tablet daily except 1/2 tablet on Mondays and Thursdays Call with any questions Continue greens Recheck in 3 weeks

## 2020-04-02 ENCOUNTER — Ambulatory Visit: Payer: Medicaid Other | Admitting: *Deleted

## 2020-04-02 DIAGNOSIS — I4892 Unspecified atrial flutter: Secondary | ICD-10-CM

## 2020-04-02 DIAGNOSIS — Z5181 Encounter for therapeutic drug level monitoring: Secondary | ICD-10-CM

## 2020-04-02 DIAGNOSIS — Z952 Presence of prosthetic heart valve: Secondary | ICD-10-CM | POA: Diagnosis not present

## 2020-04-02 LAB — POCT INR: INR: 2 (ref 2.0–3.0)

## 2020-04-02 NOTE — Patient Instructions (Signed)
Increase warfarin to 1 tablet daily except 1/2 tablet on Mondays  Call with any questions Continue greens Recheck in 4 weeks Please use Code [phone]

## 2020-04-10 ENCOUNTER — Telehealth: Payer: Self-pay | Admitting: Cardiovascular Disease

## 2020-04-10 NOTE — Telephone Encounter (Signed)
Pt called in and rec'd a letter to sch appt in April .  No appts available , pt only wants to see Dr [name]   Best number [phone]

## 2020-04-10 NOTE — Telephone Encounter (Signed)
Appointment scheduled.  Pt aware.

## 2020-04-19 ENCOUNTER — Other Ambulatory Visit: Payer: Self-pay | Admitting: Cardiovascular Disease

## 2020-04-30 ENCOUNTER — Ambulatory Visit: Payer: Medicaid Other | Admitting: *Deleted

## 2020-04-30 DIAGNOSIS — I4892 Unspecified atrial flutter: Secondary | ICD-10-CM | POA: Diagnosis not present

## 2020-04-30 DIAGNOSIS — Z5181 Encounter for therapeutic drug level monitoring: Secondary | ICD-10-CM

## 2020-04-30 LAB — POCT INR: INR: 1.9 — AB (ref 2.0–3.0)

## 2020-04-30 NOTE — Patient Instructions (Signed)
Take warfarin 1 1/2 tablets tonight then increase dose to 1 tablet daily Call with any questions Continue greens Recheck in 4 weeks Please use Code [phone]

## 2020-05-14 ENCOUNTER — Telehealth (INDEPENDENT_AMBULATORY_CARE_PROVIDER_SITE_OTHER): Payer: Medicaid Other | Admitting: Internal Medicine

## 2020-05-14 ENCOUNTER — Encounter: Payer: Self-pay | Admitting: Internal Medicine

## 2020-05-14 ENCOUNTER — Other Ambulatory Visit: Payer: Self-pay

## 2020-05-14 VITALS — Ht 63.0 in | Wt 185.0 lb

## 2020-05-14 DIAGNOSIS — I493 Ventricular premature depolarization: Secondary | ICD-10-CM | POA: Diagnosis not present

## 2020-05-14 DIAGNOSIS — E669 Obesity, unspecified: Secondary | ICD-10-CM

## 2020-05-14 DIAGNOSIS — I5032 Chronic diastolic (congestive) heart failure: Secondary | ICD-10-CM

## 2020-05-14 NOTE — Progress Notes (Signed)
Electrophysiology TeleHealth Note   Due to national recommendations of social distancing due to COVID 19, an audio/video telehealth visit is felt to be most appropriate for this patient at this time.  See MyChart message from today for the patient's consent to telehealth for [facility].   Date:  05/14/2020   ID:  Lindsay Bonilla, DOB 08/16/1960, MRN [id number]  Location: patient's home  Provider location: [address], [place]  Evaluation Performed: Follow-up visit  PCP:  Leeanne Rio, MD  Cardiologist:  Lauree Chandler, MD  Electrophysiologist:  Dr Lovena Le  Chief Complaint:  "I've been feeling sick. I cannot lose weight."  History of Present Illness:    Lindsay Bonilla is a 60 y.o. female who presents via audio/video conferencing for a telehealth visit today.  Since last being seen in our clinic, the patient reports doing very well.  Today, she denies symptoms of palpitations, chest pain, shortness of breath,  lower extremity edema, dizziness, presyncope, or syncope.  The patient is otherwise without complaint today.  The patient denies symptoms of fevers, chills, cough, or new SOB worrisome for COVID 19.  Past Medical History:  Diagnosis Date  . Anxiety   . Aortic aneurysm ([place])   . Aortic insufficiency    a. s/p [place] with mechanical AVR 4/17 >> FU Echo 6/17: Mild LVH, EF 60-65%, normal wall motion, ventricular septum with paradoxical septal motion, [employer] mechanical AVR functioning  normally with no regurgitation (mean 6 mmHg), MAC, trivial MR, mild RVE, trivial TR  . Aortic stenosis, severe    Bicuspid valve  . Arthritis   . Asthma   . Atrial flutter ([place])   . Bitten or stung by nonvenomous insect and other nonvenomous arthropods, initial encounter   . Bronchitis   . Chronic diastolic CHF (congestive heart failure) ([place])   . Chronic respiratory failure with hypoxia ([place])   . COPD (chronic obstructive pulmonary disease) ([place])   . DDD  (degenerative disc disease), cervical   . Depression   . Endocarditis, valve unspecified   . GERD (gastroesophageal reflux disease)   . Hard of hearing   . Heart failure ([place])   . Heart failure, unspecified ([place])   . Hemorrhage, not elsewhere classified   . History of pneumonia   . 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 Hyperkalemia   . Low back pain   . Migraine headache   . Mild CAD   . Other chronic pain   . Palpitations   . PAT (paroxysmal atrial tachycardia) ([place])   . PONV (postoperative nausea and vomiting)    "only once"  . Premature atrial contractions   . PVC's (premature ventricular contractions)   . Shortness of breath    Nodule left lung CT done 8/6  . Sinus bradycardia   . Stress incontinence   . Unspecified osteoarthritis, unspecified site   . Valvular heart disease     Past Surgical History:  Procedure Laterality Date  . ABDOMINAL HYSTERECTOMY    . ANTERIOR CERVICAL DECOMP/DISCECTOMY FUSION  10/05/2011   Procedure: ANTERIOR CERVICAL DECOMPRESSION/DISCECTOMY FUSION 2 LEVELS;  Surgeon: Floyce Stakes, MD;  Location: [facility];  Service: Neurosurgery;  Laterality: N/A;  Cervical five - six , six - seven Anterior cervical decompression/diskectomy/fusion/plate  . ANTERIOR CRUCIATE LIGAMENT REPAIR Right   . AORTIC VALVE REPLACEMENT    . ARTHROSCOPIC REPAIR ACL  rt  . BENTALL PROCEDURE N/A 06/15/2015   Procedure:  BENTALL PROCEDURE; HEMI-ARCH REPAIR WITH #23 ST JUDE MECHANICAL AVR CONDUIT AND #28 HEMASHIELD PLATINUM GRAFT;  Surgeon: Gaye Pollack, MD;  Location: [place] [facility];  Service: Open Heart Surgery;  Laterality: N/A;  . CARDIAC CATHETERIZATION N/A 05/21/2015   Procedure: Right/Left Heart Cath and Coronary Angiography;  Surgeon: [name], MD;  Location: [place];  Service: Cardiovascular;  Laterality: N/A;  . CHOLECYSTECTOMY    . COLONOSCOPY  10/13/05  . DIAGNOSTIC LAPAROSCOPY    . JOINT REPLACEMENT Right 2016  .  KNEE ARTHROSCOPY     rt x4  x1 lft  . MYRINGOPLASTY W/ FAT GRAFT     graft from behind ear  . SIGMOIDOSCOPY  10/13/05, 09/19/05  . TEE WITHOUT CARDIOVERSION N/A 06/15/2015   Procedure: TRANSESOPHAGEAL ECHOCARDIOGRAM (TEE);  Surgeon: Gaye Pollack, MD;  Location: [place];  Service: Open Heart Surgery;  Laterality: N/A;  . TONSILLECTOMY  2005  . TUBAL LIGATION    . TYMPANOSTOMY TUBE PLACEMENT      Current Outpatient Medications  Medication Sig Dispense Refill  . albuterol (PROVENTIL) (2.5 MG/3ML) 0.083% nebulizer solution Take 3 mLs (2.5 mg total) by nebulization every 6 (six) hours as needed for wheezing or shortness of breath. 75 mL 3  . albuterol (VENTOLIN HFA) 108 (90 Base) MCG/ACT inhaler Inhale 1 puff into the lungs 4 (four) times daily as needed. For shortness of breath 18 g 3  . amoxicillin (AMOXIL) 500 MG capsule TAKE 4 TABLETS BY MOUTH 30-60 MINUTES PRIOR TO DENTAL PROCEDURES 8 capsule 1  . aspirin EC 81 MG tablet Take 1 tablet (81 mg total) by mouth daily.    . Budeson-Glycopyrrol-Formoterol (BREZTRI AEROSPHERE) 160-9-4.8 MCG/ACT AERO Inhale 2 puffs into the lungs in the morning and at bedtime. 10.7 g 0  . Budeson-Glycopyrrol-Formoterol (BREZTRI AEROSPHERE) 160-9-4.8 MCG/ACT AERO Inhale 2 puffs into the lungs in the morning and at bedtime. 10.7 g 0  . Budeson-Glycopyrrol-Formoterol (BREZTRI AEROSPHERE) 160-9-4.8 MCG/ACT AERO Inhale 2 puffs into the lungs in the morning and at bedtime. 10.7 g 5  . EPINEPHrine (EPIPEN) 0.3 mg/0.3 mL IJ SOAJ injection Inject 0.3 mg into the muscle as needed. For allergic reaction    . Ferrous Gluconate (IRON 27 PO) Take 1 tablet by mouth daily.    . furosemide (LASIX) 80 MG tablet Take 1 tablet (80 mg total) by mouth 2 (two) times daily. 60 tablet 11  . losartan (COZAAR) 25 MG tablet Take 1 tablet (25 mg total) by mouth daily. 30 tablet 11  . magnesium oxide (MAG-OX) 400 MG tablet Take 1 tablet (400 mg total) by mouth daily. 90 tablet 3  . metolazone  (ZAROXOLYN) 2.5 MG tablet TAKE 1 TABLET BY MOUTH ON MONDAY, WEDNESDAY AND FRIDAY. 12 tablet 11  . montelukast (SINGULAIR) 10 MG tablet Take 1 tablet (10 mg total) by mouth at bedtime. 30 tablet 3  . naloxegol oxalate (MOVANTIK) 25 MG TABS tablet Take 25 mg by mouth daily.    Marland Kitchen omeprazole (PRILOSEC OTC) 20 MG tablet Take 1 tablet by mouth daily.    Marland Kitchen oxyCODONE-acetaminophen (PERCOCET) 10-325 MG tablet Take 1 tablet by mouth every 4 (four) hours as needed for pain.    . potassium chloride (KLOR-CON) 10 MEQ tablet TAKE EIGHT TABLETS BY MOUTH TWICE DAILY 480 tablet 6  . pravastatin (PRAVACHOL) 40 MG tablet TAKE 1 TABLET EVERY EVENING. 90 tablet 3  . promethazine (PHENERGAN) 25 MG tablet promethazine 25 mg tablet  TAKE 1/2 TABLET BY MOUTH FOUR TIMES DAILY  AS NEEDED    . warfarin (COUMADIN) 5 MG tablet TAKE 1 TABLET ONCE DAILY EXCEPT 1/2 TABLET ON MONDAYS OR AS DIRECTED 30 tablet 6  . potassium chloride (KLOR-CON) 10 MEQ tablet Take by mouth every 3 (three) hours as needed.     No current facility-administered medications for this visit.    Allergies:   Beta adrenergic blockers, Doxycycline, Peanut-containing drug products, Shrimp [shellfish allergy], Vancomycin, Other, Avelox [moxifloxacin hcl in nacl], Ciprofloxacin, Cleocin [clindamycin hcl], Moxifloxacin, Sulfamethoxazole-trimethoprim, and Tape   Social History:  The patient  reports that she quit smoking about 5 years ago. Her smoking use included cigarettes. She has a 3.00 pack-year smoking history. She has never used smokeless tobacco. She reports that she does not drink alcohol and does not use drugs.   Family History:  The patient's  family history includes Asthma in her brother; Cancer in her mother; Diabetes in an other family member; Heart attack in her father; Hyperlipidemia in an other family member.   ROS:  Please see the history of present illness.   All other systems are personally reviewed and negative.    Exam:    Vital Signs:   Ht _0  (1.6 m)   Wt 185 lb (83.9 kg)   BMI 32.77 kg/m   Well appearing, alert and conversant, regular work of breathing,  good skin color Eyes- anicteric, neuro- grossly intact, skin- no apparent rash or lesions or cyanosis, mouth- oral mucosa is pink   Labs/Other Tests and Data Reviewed:    Recent Labs: 06/28/2019: ALT 14; BUN 16; Creatinine, Ser 0.75; Potassium 4.1; Sodium 138   Wt Readings from Last 3 Encounters:  05/14/20 185 lb (83.9 kg)  02/17/20 192 lb (87.1 kg)  12/05/19 190 lb 3.2 oz (86.3 kg)     Other studies personally reviewed: Additional studies/ records that were reviewed today include:    Cardiac monitor reviewed   ASSESSMENT & PLAN:    1.  Palpitations: her symptoms are mostly quiet. She will continue her current meds.  2. S/p AVR - her echo just under a year ago was reassuring.  3. Obesity - I strongly encouraged her to lose weight. 4.COVID 19 screen The patient denies symptoms of COVID 19 at this time.  The importance of social distancing was discussed today.  Follow-up:  1 yr Next remote: n/a  Current medicines are reviewed at length with the patient today.   The patient does not have concerns regarding her medicines.  The following changes were made today:  none  Labs/ tests ordered today include: none No orders of the defined types were placed in this encounter.    Patient Risk:  after full review of this patients clinical status, I feel that they are at moderate risk at this time.  Today, I have spent 15 minutes with the patient with telehealth technology discussing all of the above .    Signed, Cristopher Peru, MD  05/14/2020 9:51 AM     [address] [place] [address] [phone] (office) [phone] (fax)

## 2020-05-14 NOTE — Patient Instructions (Signed)
Medication Instructions:  Your physician recommends that you continue on your current medications as directed. Please refer to the Current Medication list given to you today.  *If you need a refill on your cardiac medications before your next appointment, please call your pharmacy*   Lab Work: NONE   If you have labs (blood work) drawn today and your tests are completely normal, you will receive your results only by: . MyChart Message (if you have MyChart) OR . A paper copy in the mail If you have any lab test that is abnormal or we need to change your treatment, we will call you to review the results.   Testing/Procedures: NONE    Follow-Up: At [facility], you and your health needs are our priority.  As part of our continuing mission to provide you with exceptional heart care, we have created designated Provider Care Teams.  These Care Teams include your primary Cardiologist (physician) and Advanced Practice Providers (APPs -  Physician Assistants and Nurse Practitioners) who all work together to provide you with the care you need, when you need it.  We recommend signing up for the patient portal called "MyChart".  Sign up information is provided on this After Visit Summary.  MyChart is used to connect with patients for Virtual Visits (Telemedicine).  Patients are able to view lab/test results, encounter notes, upcoming appointments, etc.  Non-urgent messages can be sent to your provider as well.   To learn more about what you can do with MyChart, go to [email or web address].    Your next appointment:   1 year(s)  The format for your next appointment:   In Person  Provider:   Gregg Taylor, MD   Other Instructions Thank you for choosing [facility]!    

## 2020-06-01 ENCOUNTER — Encounter: Payer: Self-pay | Admitting: Cardiovascular Disease

## 2020-06-01 ENCOUNTER — Ambulatory Visit: Payer: Medicaid Other | Admitting: Cardiovascular Disease

## 2020-06-01 ENCOUNTER — Other Ambulatory Visit: Payer: Self-pay

## 2020-06-01 VITALS — BP 128/76 | HR 86 | Ht 63.0 in | Wt 186.0 lb

## 2020-06-01 DIAGNOSIS — I5032 Chronic diastolic (congestive) heart failure: Secondary | ICD-10-CM | POA: Diagnosis not present

## 2020-06-01 DIAGNOSIS — I493 Ventricular premature depolarization: Secondary | ICD-10-CM

## 2020-06-01 DIAGNOSIS — I2510 Atherosclerotic heart disease of native coronary artery without angina pectoris: Secondary | ICD-10-CM | POA: Diagnosis not present

## 2020-06-01 DIAGNOSIS — Z952 Presence of prosthetic heart valve: Secondary | ICD-10-CM

## 2020-06-01 DIAGNOSIS — I10 Essential (primary) hypertension: Secondary | ICD-10-CM | POA: Diagnosis not present

## 2020-06-01 NOTE — Patient Instructions (Signed)
Medication Instructions:  No changes *If you need a refill on your cardiac medications before your next appointment, please call your pharmacy*   Lab Work: none If you have labs (blood work) drawn today and your tests are completely normal, you will receive your results only by: Marland Kitchen MyChart Message (if you have MyChart) OR . A paper copy in the mail If you have any lab test that is abnormal or we need to change your treatment, we will call you to review the results.   Testing/Procedures: none   Follow-Up: At [facility], you and your health needs are our priority.  As part of our continuing mission to provide you with exceptional heart care, we have created designated Provider Care Teams.  These Care Teams include your primary Cardiologist (physician) and Advanced Practice Providers (APPs -  Physician Assistants and Nurse Practitioners) who all work together to provide you with the care you need, when you need it.  Your next appointment:   12 month(s)  The format for your next appointment:   In Person  Provider:   You may see Lauree Chandler, MD or one of the following Advanced Practice Providers on your designated Care Team:    Melina Copa, PA-C  Ermalinda Barrios, PA-C

## 2020-06-01 NOTE — Progress Notes (Signed)
Chief Complaint  Patient presents with  . Follow-up    Aortic valve disease   History of Present Illness: 60 yo female with history of bicuspid aortic valve s/p Ross procedure in 1998 with thoracic aortic aneurysm and then Bentall procedure in April 2017, severe COPD, GERD, anxiety, chronic diastolic CHF,  tobacco abuse, PVCs, PACs here today for cardiac follow up. She has been followed for aortic valve insufficiency and thoracic aortic aneurysm and underwent Bentall procedure per Dr. Cyndia Bent on 06/15/15.  Cardiac cath March 2017 with mild CAD (20% RCA stenosis). Echo June 2017 with normally functioning mechanical aortic valve replacement, normal LV systolic function. Cardiac monitor June 2017 with PACs, PVCs. She has chronic diastolic CHF and is on Lasix. Normal ABI December 2018. Venous dopplers August 2019 with no evidence of DVT. Volume overload when seen in our office in October 2018. Metolazone added 3 times per week to her Lasix and her volume overload improved. Echo December 2019 with LVEF=60-65%, mild MR. She did not tolerate Cardizem due to sinus node dysfunction and was seen in EP clinic. Her cardizem was stopped. Echo March 2021 with LVEF=55-60%, trivial MR, well functioning AVR. She was seen mid March 2021 in the EP clinic by Dr. Lovena Le and he reassured her that her heart rate was truly not in the 40s as she was having PVCs. She was last seen in EP clinic 05/14/20 and no changes were made. She is followed in the pulmonary office for severe COPD and is now on supplemental O2 at night.   She is here today for follow up. The patient denies any chest pain, dyspnea, lower extremity edema, orthopnea, PND, dizziness, near syncope or syncope. She has some palpitations.     Primary Care Physician: Leeanne Rio, MD  Past Medical History:  Diagnosis Date  . Anxiety   . Aortic aneurysm ([place])   . Aortic insufficiency    a. s/p Bentall with mechanical AVR 4/17 >> FU Echo 6/17: Mild LVH, EF  60-65%, normal wall motion, ventricular septum with paradoxical septal motion, St. Jude mechanical AVR functioning  normally with no regurgitation (mean 6 mmHg), MAC, trivial MR, mild RVE, trivial TR  . Aortic stenosis, severe    Bicuspid valve  . Arthritis   . Asthma   . Atrial flutter ([place])   . Bitten or stung by nonvenomous insect and other nonvenomous arthropods, initial encounter   . Bronchitis   . Chronic diastolic CHF (congestive heart failure) ([place])   . Chronic respiratory failure with hypoxia ([place])   . COPD (chronic obstructive pulmonary disease) ([place])   . DDD (degenerative disc disease), cervical   . Depression   . Endocarditis, valve unspecified   . GERD (gastroesophageal reflux disease)   . Hard of hearing   . Heart failure ([place])   . Heart failure, unspecified ([place])   . Hemorrhage, not elsewhere classified   . History of pneumonia   . 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 Hyperkalemia   . Low back pain   . Migraine headache   . Mild CAD   . Other chronic pain   . Palpitations   . PAT (paroxysmal atrial tachycardia) ([place])   . PONV (postoperative nausea and vomiting)    "only once"  . Premature atrial contractions   . PVC's (premature ventricular contractions)   . Shortness of breath    Nodule left lung CT done 8/6  . Sinus bradycardia   . Stress  incontinence   . Unspecified osteoarthritis, unspecified site   . Valvular heart disease     Past Surgical History:  Procedure Laterality Date  . ABDOMINAL HYSTERECTOMY    . ANTERIOR CERVICAL DECOMP/DISCECTOMY FUSION  10/05/2011   Procedure: ANTERIOR CERVICAL DECOMPRESSION/DISCECTOMY FUSION 2 LEVELS;  Surgeon: Floyce Stakes, MD;  Location: [facility];  Service: Neurosurgery;  Laterality: N/A;  Cervical five - six , six - seven Anterior cervical decompression/diskectomy/fusion/plate  . ANTERIOR CRUCIATE LIGAMENT REPAIR Right   . AORTIC VALVE REPLACEMENT    . ARTHROSCOPIC REPAIR ACL  rt   . BENTALL PROCEDURE N/A 06/15/2015   Procedure: BENTALL PROCEDURE; HEMI-ARCH REPAIR WITH #23 ST JUDE MECHANICAL AVR CONDUIT AND #28 HEMASHIELD PLATINUM GRAFT;  Surgeon: Gaye Pollack, MD;  Location: [facility];  Service: Open Heart Surgery;  Laterality: N/A;  . CARDIAC CATHETERIZATION N/A 05/21/2015   Procedure: Right/Left Heart Cath and Coronary Angiography;  Surgeon: [name], MD;  Location: [facility];  Service: Cardiovascular;  Laterality: N/A;  . CHOLECYSTECTOMY    . COLONOSCOPY  10/13/05  . DIAGNOSTIC LAPAROSCOPY    . JOINT REPLACEMENT Right 2016  . KNEE ARTHROSCOPY     rt x4  x1 lft  . MYRINGOPLASTY W/ FAT GRAFT     graft from behind ear  . SIGMOIDOSCOPY  10/13/05, 09/19/05  . TEE WITHOUT CARDIOVERSION N/A 06/15/2015   Procedure: TRANSESOPHAGEAL ECHOCARDIOGRAM (TEE);  Surgeon: Gaye Pollack, MD;  Location: [facility];  Service: Open Heart Surgery;  Laterality: N/A;  . TONSILLECTOMY  2005  . TUBAL LIGATION    . TYMPANOSTOMY TUBE PLACEMENT      Current Outpatient Medications  Medication Sig Dispense Refill  . albuterol (PROVENTIL) (2.5 MG/3ML) 0.083% nebulizer solution Take 3 mLs (2.5 mg total) by nebulization every 6 (six) hours as needed for wheezing or shortness of breath. 75 mL 3  . albuterol (VENTOLIN HFA) 108 (90 Base) MCG/ACT inhaler Inhale 1 puff into the lungs 4 (four) times daily as needed. For shortness of breath 18 g 3  . amoxicillin (AMOXIL) 500 MG capsule TAKE 4 TABLETS BY MOUTH 30-60 MINUTES PRIOR TO DENTAL PROCEDURES 8 capsule 1  . aspirin EC 81 MG tablet Take 1 tablet (81 mg total) by mouth daily.    . Budeson-Glycopyrrol-Formoterol (BREZTRI AEROSPHERE) 160-9-4.8 MCG/ACT AERO Inhale 2 puffs into the lungs in the morning and at bedtime. 10.7 g 5  . EPINEPHrine (EPIPEN) 0.3 mg/0.3 mL IJ SOAJ injection Inject 0.3 mg into the muscle as needed. For allergic reaction    . Ferrous Gluconate (IRON 27 PO) Take 1 tablet by mouth daily.    . furosemide (LASIX) 80 MG  tablet Take 1 tablet (80 mg total) by mouth 2 (two) times daily. 60 tablet 11  . losartan (COZAAR) 25 MG tablet Take 1 tablet (25 mg total) by mouth daily. 30 tablet 11  . magnesium oxide (MAG-OX) 400 MG tablet Take 1 tablet (400 mg total) by mouth daily. 90 tablet 3  . metolazone (ZAROXOLYN) 2.5 MG tablet TAKE 1 TABLET BY MOUTH ON MONDAY, WEDNESDAY AND FRIDAY. 12 tablet 11  . montelukast (SINGULAIR) 10 MG tablet Take 1 tablet (10 mg total) by mouth at bedtime. 30 tablet 3  . naloxegol oxalate (MOVANTIK) 25 MG TABS tablet Take 25 mg by mouth daily.    Marland Kitchen omeprazole (PRILOSEC OTC) 20 MG tablet Take 1 tablet by mouth daily.    Marland Kitchen oxyCODONE-acetaminophen (PERCOCET) 10-325 MG tablet Take 1 tablet by mouth every  4 (four) hours as needed for pain.    . potassium chloride (KLOR-CON) 10 MEQ tablet TAKE EIGHT TABLETS BY MOUTH TWICE DAILY 480 tablet 6  . pravastatin (PRAVACHOL) 40 MG tablet TAKE 1 TABLET EVERY EVENING. 90 tablet 3  . promethazine (PHENERGAN) 25 MG tablet promethazine 25 mg tablet  TAKE 1/2 TABLET BY MOUTH FOUR TIMES DAILY AS NEEDED    . warfarin (COUMADIN) 5 MG tablet TAKE 1 TABLET ONCE DAILY EXCEPT 1/2 TABLET ON MONDAYS OR AS DIRECTED 30 tablet 6   No current facility-administered medications for this visit.    Allergies  Allergen Reactions  . Beta Adrenergic Blockers Anaphylaxis  . Doxycycline Other (See Comments)    "Swelling, dizziness, sleepy, talking out of my head, almost caused liver failure"  . Peanut-Containing Drug Products Anaphylaxis, Shortness Of Breath, Swelling and Other (See Comments)    Swelling of the throat  . Shrimp [Shellfish Allergy] Anaphylaxis  . Vancomycin Swelling    Rash and severe swelling  . Other     Red Meat and Dairy Products   . Avelox [Moxifloxacin Hcl In Nacl] Rash  . Ciprofloxacin Nausea And Vomiting  . Cleocin [Clindamycin Hcl] Rash  . Moxifloxacin Rash  . Sulfamethoxazole-Trimethoprim Rash  . Tape Other (See Comments)    White clear  itches, rash    Social History   Socioeconomic History  . Marital status: Divorced    Spouse name: Not on file  . Number of children: Not on file  . Years of education: Not on file  . Highest education level: Not on file  Occupational History  . Not on file  Tobacco Use  . Smoking status: Former Smoker    Packs/day: 0.10    Years: 30.00    Pack years: 3.00    Types: Cigarettes    Quit date: 04/29/2015    Years since quitting: 5.0  . Smokeless tobacco: Never Used  Vaping Use  . Vaping Use: Never used  Substance and Sexual Activity  . Alcohol use: No    Alcohol/week: 0.0 standard drinks  . Drug use: No  . Sexual activity: Never  Other Topics Concern  . Not on file  Social History Narrative  . Not on file   Social Determinants of Health   Financial Resource Strain: Not on file  Food Insecurity: Not on file  Transportation Needs: Not on file  Physical Activity: Not on file  Stress: Not on file  Social Connections: Not on file  Intimate Partner Violence: Not on file    Family History  Problem Relation Age of Onset  . Heart attack Father   . Cancer Mother        Type unknown  . Diabetes Other        family history  . Asthma Brother   . Hyperlipidemia Other        family history    Review of Systems:  As stated in the HPI and otherwise negative.   BP 128/76   Pulse 86   Ht _0  (1.6 m)   Wt 186 lb (84.4 kg)   SpO2 99%   BMI 32.95 kg/m   Physical Examination: General: Well developed, well nourished, NAD  HEENT: OP clear, mucus membranes moist  SKIN: warm, dry. No rashes. Neuro: No focal deficits  Musculoskeletal: Muscle strength 5/5 all ext  Psychiatric: Mood and affect normal  Neck: No JVD, no carotid bruits, no thyromegaly, no lymphadenopathy.  Lungs:Clear bilaterally, no wheezes, rhonci, crackles Cardiovascular: Regular rate  and rhythm. No murmurs, gallops or rubs. Abdomen:Soft. Bowel sounds present. Non-tender.  Extremities: No lower extremity  edema. Pulses are 2 + in the bilateral DP/PT.  Echo 05/19/19: 1. Left ventricular ejection fraction, by estimation, is 55 to 60%. The  left ventricle has normal function. The left ventricle demonstrates  regional wall motion abnormalities (see scoring diagram/findings for  description). There is mild left ventricular  hypertrophy. Left ventricular diastolic parameters are indeterminate.  2. Right ventricular systolic function is normal. The right ventricular  size is normal. There is mildly elevated pulmonary artery systolic  pressure.  3. Left atrial size was mildly dilated.  4. The mitral valve is normal in structure. Trivial mitral valve  regurgitation. No evidence of mitral stenosis.  5. FROM RECORDS PATIENT IS S/P HEMI-ARCH REPAIR WITH #23 ST JUDE  MECHANICAL AVR CONDUIT AND #28 HEMASHIELD PLATINUM GRAFT. The aortic valve  was not well visualized. Aortic valve regurgitation is not visualized. No  aortic stenosis is present.  6. The inferior vena cava is normal in size with greater than 50%  respiratory variability, suggesting right atrial pressure of 3 mmHg.   EKG:  EKG is not ordered today. The ekg ordered today demonstrates  Recent Labs: 06/28/2019: ALT 14; BUN 16; Creatinine, Ser 0.75; Potassium 4.1; Sodium 138   Lipid Panel    Component Value Date/Time   CHOL 171 06/28/2019 0000   TRIG 205 (H) 06/28/2019 0000   HDL 50 06/28/2019 0000   CHOLHDL 3.4 06/28/2019 0000   CHOLHDL 2.7 10/22/2015 0933   VLDL 19 10/22/2015 0933   LDLCALC 87 06/28/2019 0000   LDLDIRECT 79.0 03/25/2014 0947     Wt Readings from Last 3 Encounters:  06/01/20 186 lb (84.4 kg)  05/14/20 185 lb (83.9 kg)  02/17/20 192 lb (87.1 kg)     Other studies Reviewed: Additional studies/ records that were reviewed today include: . Review of the above records demonstrates:    Assessment and Plan:   1. Aortic valve disease: She is s/p mechanical AVR in 2017. Working well by echo March 2021.  Will continue coumadin and SBE prophylaxis.    2. Thoracic aortic aneurysm: She is s/p Bentall procedure 2017.  3. Tobacco abuse, in remission: She has stopped smoking.   4. Palpitations/PVCs/PACs:  She did not tolerate Cardizem due to sinus node dysfunction. Cardiac monitor in June 2021 with sinus with no bradycardia but with known PVCs, PACs and several short runs of SVT and VT. She is followed in the EP clinic.      5. HLD: Lipids followed in primary care. Continue statin.      6. Chronic diastolic CHF: Her weight is stable today. No evidence of volume overload on exam. Will continue Lasix.    7. CAD: Mild RCA plaque by cath 2017. No chest pain. Continue ASA and statin  8. HTN: BP is well controlled. Continue current therapy   Current medicines are reviewed at length with the patient today.  The patient does not have concerns regarding medicines.  The following changes have been made:  no change  Labs/ tests ordered today include:   No orders of the defined types were placed in this encounter.   Disposition:   FU with me in 12 months  Signed, Lauree Chandler, MD 06/01/2020 10:53 AM    [facility] [employer], [place], [place]  [address] Phone: [phone]; Fax: [phone]

## 2020-06-02 ENCOUNTER — Ambulatory Visit: Payer: Medicaid Other | Admitting: *Deleted

## 2020-06-02 DIAGNOSIS — I4892 Unspecified atrial flutter: Secondary | ICD-10-CM | POA: Diagnosis not present

## 2020-06-02 DIAGNOSIS — Z5181 Encounter for therapeutic drug level monitoring: Secondary | ICD-10-CM | POA: Diagnosis not present

## 2020-06-02 LAB — POCT INR: INR: 4.2 — AB (ref 2.0–3.0)

## 2020-06-02 NOTE — Patient Instructions (Signed)
Hold warfarin tonight then decrease dose to 1 tablet daily except 1/2 tablet on Wednesdays Call with any questions Continue greens Recheck in 2 weeks Please use Code [phone]

## 2020-06-16 ENCOUNTER — Ambulatory Visit: Payer: Medicaid Other | Admitting: *Deleted

## 2020-06-16 DIAGNOSIS — Z5181 Encounter for therapeutic drug level monitoring: Secondary | ICD-10-CM

## 2020-06-16 DIAGNOSIS — I4892 Unspecified atrial flutter: Secondary | ICD-10-CM

## 2020-06-16 LAB — POCT INR: INR: 2.9 (ref 2.0–3.0)

## 2020-06-16 NOTE — Patient Instructions (Signed)
Continue warfarin 1 tablet daily except 1/2 tablet on Wednesdays Call with any questions Continue greens Recheck in 3 weeks Please use Code [phone]

## 2020-07-06 ENCOUNTER — Telehealth: Payer: Self-pay | Admitting: Pulmonary Disease

## 2020-07-06 NOTE — Telephone Encounter (Signed)
Spoke with pt, aware of recs.  Nothing further needed at this time- will close encounter.   

## 2020-07-06 NOTE — Telephone Encounter (Signed)
Called and spoke with patient who stated she went to urgent care in [place] this morning for cough, sneezing, runny nose and headaches and stated she tested positive for COVID. Patient states she wears O2 at night and PRN and currently her pulse ox is reading at 95% on room air. Patient denies feeling short of breath or fever at this time. Patient states she's had all her covid shots including 2 booster shots. Discussed with patient to keep O2 sats above 90% and to use O2 if/when needed and seek emergency care if she is unable to keep O2 sat above 90%. Patient called this office and PCP office wanting a referral placed to outpatient infusion clinic at [facility] in [place] if possible. When writer spoke to patient, patient stated she spoke with Dr Roland Rack (PCP) office and stated that they were sending in the referral to outpatient infusion clinic. Writer called and spoke with CMA Baxter Flattery at Dr [employer]  office who confirmed that Dr Huel Cote put in referral. Patient is also aware.   Will route to Dr Elsworth Soho to make aware and if he has any other suggestions.

## 2020-07-06 NOTE — Telephone Encounter (Signed)
Would reassure her that if she is vaccinated and boosted, this is likely going to be a mild illness. But agree that if we can get her monoclonal antibody infusion then she should go ahead and get this. Other alternative would be checking with her pharmacy if they carry paxlovid -which is a medication used for COVID

## 2020-07-07 NOTE — Telephone Encounter (Signed)
I called and spoke with the patient. This am she had MAB infusion. She will monitor symptoms and call us if she notes any racing or pounding heartbeats. She has a pulse ox, sats in 90s consistently.  Encouraged ambulation as tolerated, cough and deep breathing, vit c, vit d, zinc, and Scope or ACT mouthwash gargles.  She thanked me for the information and voices understanding.  She asks if any of the vitamins/minerals interact w coumadin.  Adv that I would inform her coumadin clinic of her covid diagnosis and make sure they do not have any concerns with medications.

## 2020-07-08 ENCOUNTER — Other Ambulatory Visit: Payer: Self-pay | Admitting: Cardiovascular Disease

## 2020-07-20 ENCOUNTER — Ambulatory Visit: Payer: Medicaid Other | Admitting: *Deleted

## 2020-07-20 DIAGNOSIS — Z5181 Encounter for therapeutic drug level monitoring: Secondary | ICD-10-CM | POA: Diagnosis not present

## 2020-07-20 DIAGNOSIS — I4892 Unspecified atrial flutter: Secondary | ICD-10-CM | POA: Diagnosis not present

## 2020-07-20 DIAGNOSIS — Z952 Presence of prosthetic heart valve: Secondary | ICD-10-CM

## 2020-07-20 LAB — POCT INR: INR: 3.2 — AB (ref 2.0–3.0)

## 2020-07-20 NOTE — Patient Instructions (Signed)
Hold warfarin tonight then resume 1 tablet daily except 1/2 tablet on Wednesdays Continue greens Recheck in 4 weeks Please use Code [phone]

## 2020-07-21 ENCOUNTER — Other Ambulatory Visit: Payer: Self-pay | Admitting: Internal Medicine

## 2020-07-21 DIAGNOSIS — E782 Mixed hyperlipidemia: Secondary | ICD-10-CM

## 2020-08-03 ENCOUNTER — Other Ambulatory Visit: Payer: Self-pay | Admitting: Cardiovascular Disease

## 2020-08-17 ENCOUNTER — Other Ambulatory Visit: Payer: Self-pay | Admitting: Internal Medicine

## 2020-08-17 ENCOUNTER — Ambulatory Visit: Payer: Medicaid Other | Admitting: *Deleted

## 2020-08-17 ENCOUNTER — Other Ambulatory Visit: Payer: Self-pay | Admitting: Pulmonary Disease

## 2020-08-17 ENCOUNTER — Other Ambulatory Visit: Payer: Self-pay

## 2020-08-17 DIAGNOSIS — Z5181 Encounter for therapeutic drug level monitoring: Secondary | ICD-10-CM

## 2020-08-17 DIAGNOSIS — I4892 Unspecified atrial flutter: Secondary | ICD-10-CM

## 2020-08-17 DIAGNOSIS — Z952 Presence of prosthetic heart valve: Secondary | ICD-10-CM | POA: Diagnosis not present

## 2020-08-17 DIAGNOSIS — E782 Mixed hyperlipidemia: Secondary | ICD-10-CM

## 2020-08-17 LAB — POCT INR: INR: 3.1 — AB (ref 2.0–3.0)

## 2020-08-17 NOTE — Telephone Encounter (Signed)
Rx(s) sent to pharmacy electronically.  

## 2020-08-17 NOTE — Patient Instructions (Signed)
Take warfarin 1/2 tablet tonight then decrease dose to 1 tablet daily except 1/2 tablet on Sundays and Wednesdays Continue greens Recheck in 4 weeks Please use Code [phone]

## 2020-08-19 DIAGNOSIS — K219 Gastro-esophageal reflux disease without esophagitis: Secondary | ICD-10-CM | POA: Insufficient documentation

## 2020-08-19 DIAGNOSIS — Z9109 Other allergy status, other than to drugs and biological substances: Secondary | ICD-10-CM | POA: Insufficient documentation

## 2020-08-19 DIAGNOSIS — I10 Essential (primary) hypertension: Secondary | ICD-10-CM | POA: Insufficient documentation

## 2020-09-04 ENCOUNTER — Ambulatory Visit: Payer: Medicaid Other | Admitting: Pulmonary Disease

## 2020-09-14 ENCOUNTER — Ambulatory Visit: Payer: Medicaid Other | Admitting: *Deleted

## 2020-09-14 ENCOUNTER — Other Ambulatory Visit: Payer: Self-pay

## 2020-09-14 DIAGNOSIS — I4892 Unspecified atrial flutter: Secondary | ICD-10-CM | POA: Diagnosis not present

## 2020-09-14 DIAGNOSIS — Z5181 Encounter for therapeutic drug level monitoring: Secondary | ICD-10-CM

## 2020-09-14 DIAGNOSIS — I359 Nonrheumatic aortic valve disorder, unspecified: Secondary | ICD-10-CM

## 2020-09-14 LAB — POCT INR: INR: 1.4 — AB (ref 2.0–3.0)

## 2020-09-14 NOTE — Patient Instructions (Signed)
Take warfarin 1 1/2 tablets tonight then increase dose to 1 tablet daily Continue greens Recheck in 2 weeks Please use Code [phone]

## 2020-09-30 ENCOUNTER — Ambulatory Visit: Payer: Medicaid Other | Admitting: *Deleted

## 2020-09-30 DIAGNOSIS — Z952 Presence of prosthetic heart valve: Secondary | ICD-10-CM | POA: Diagnosis not present

## 2020-09-30 DIAGNOSIS — Z5181 Encounter for therapeutic drug level monitoring: Secondary | ICD-10-CM | POA: Diagnosis not present

## 2020-09-30 DIAGNOSIS — I4892 Unspecified atrial flutter: Secondary | ICD-10-CM | POA: Diagnosis not present

## 2020-09-30 LAB — POCT INR: INR: 3 (ref 2.0–3.0)

## 2020-09-30 NOTE — Patient Instructions (Signed)
Continue warfarin 1 tablet daily Continue greens Recheck in 4 weeks Please use Code [phone]

## 2020-10-01 ENCOUNTER — Telehealth: Payer: Self-pay | Admitting: *Deleted

## 2020-10-01 NOTE — Telephone Encounter (Signed)
Would like to know if she can take Omega

## 2020-10-01 NOTE — Telephone Encounter (Signed)
Spoke with pt.  Informed Omega 3 can increase INR.  Pt states she is not going to start at present time.  Also states she was started on Augmentin BID x 7 days on 8/1.  Told to decrease  warfarin to '5mg'$  daily except 2.'5mg'$  on Thursday and Sunday till finished with Abx then restart '5mg'$  daily.  She verbalized understanding.

## 2020-10-15 ENCOUNTER — Other Ambulatory Visit: Payer: Self-pay

## 2020-10-15 ENCOUNTER — Encounter: Payer: Self-pay | Admitting: Pulmonary Disease

## 2020-10-15 ENCOUNTER — Ambulatory Visit: Payer: Medicaid Other | Admitting: Pulmonary Disease

## 2020-10-15 VITALS — BP 124/80 | HR 77 | Temp 98.0°F | Ht 63.0 in | Wt 173.0 lb

## 2020-10-15 DIAGNOSIS — J9611 Chronic respiratory failure with hypoxia: Secondary | ICD-10-CM | POA: Diagnosis not present

## 2020-10-15 DIAGNOSIS — J432 Centrilobular emphysema: Secondary | ICD-10-CM

## 2020-10-15 NOTE — Assessment & Plan Note (Signed)
Continue to use oxygen during sleep. I once again emphasized getting enrolled in the pulmonary rehab program

## 2020-10-15 NOTE — Assessment & Plan Note (Signed)
Reschedule PFTs, if lung function remains decreased, I again discussed lung transplant evaluation and she is very hesitant and would like to hold off.  Refills will be provided on Breztri and albuterol

## 2020-10-15 NOTE — Patient Instructions (Addendum)
Reschedule PFTs in [place] Pulmonary rehab referral again Think about transplant referral

## 2020-10-15 NOTE — Progress Notes (Signed)
   Subjective:    Patient ID: Lindsay Bonilla, female    DOB: March 02, 1960, 60 y.o.   MRN: [id number]  HPI  60 yo ex-smoker for FU of COPD and chronic hypoxic respiratory failure on 2 L of oxygen   PMH - s/p Aortic Valve Replacement/s/p Bentall procedure per Dr. Cyndia Bent on 05/2015 St. Jude Mechanical valved graft , original AVR in 1998 - sinus node dysfunction on Cardizem - HFpEF- on Lasix   Chief Complaint  Patient presents with   Follow-up    Patient is doing good with Breztri but states that it doesn't last. Is doing 2 puffs in morning and 2 puffs at night.     Last seen 01/2020, she missed her appointment in May since she had COVID infection, she called [place] and we provided her with monoclonal antibody infusion.  She did well and recovered within a few days. She has lost weight from 192 to her current weight of 173 pounds, breathing is slightly improved. On her last visit we switched her from Trelegy to [place] and she states that this works well however effect seems to go away within 12 hours. She is still very nervous about infections and has not scheduled her PFTs  Significant tests/ events reviewed  PFTs 04/2015 >>ratio 33, FEV1 0.69 /25% , improved to 0.95/35% with BD, FVC 61%, TLC 126 %, DLCO 80   CT chest 07/2018 Coronary artery calcifications. Status post aortic valve and root graft repair, with probable reconstruction of the pulmonary outflow tract . Severe emphysema. Mild, diffuse bilateral bronchial wall thickening.   Echo 04/2019 nml LV fn, RVSP 37   03/2017 NPSG >> no OSA, min desatn 77%  Review of Systems neg for any significant sore throat, dysphagia, itching, sneezing, nasal congestion or excess/ purulent secretions, fever, chills, sweats, unintended wt loss, pleuritic or exertional cp, hempoptysis, orthopnea pnd or change in chronic leg swelling. Also denies presyncope, palpitations, heartburn, abdominal pain, nausea, vomiting, diarrhea or change in bowel or urinary  habits, dysuria,hematuria, rash, arthralgias, visual complaints, headache, numbness weakness or ataxia.     Objective:   Physical Exam  Gen. Pleasant, obese, in no distress ENT - no lesions, no post nasal drip Neck: No JVD, no thyromegaly, no carotid bruits Lungs: no use of accessory muscles, no dullness to percussion, decreased without rales or rhonchi  Cardiovascular: Rhythm regular, heart sounds  normal, no murmurs or gallops, no peripheral edema Musculoskeletal: No deformities, no cyanosis or clubbing , no tremors       Assessment & Plan:

## 2020-10-29 ENCOUNTER — Ambulatory Visit: Payer: Medicaid Other | Admitting: *Deleted

## 2020-10-29 ENCOUNTER — Other Ambulatory Visit: Payer: Self-pay

## 2020-10-29 DIAGNOSIS — Z5181 Encounter for therapeutic drug level monitoring: Secondary | ICD-10-CM

## 2020-10-29 DIAGNOSIS — Z952 Presence of prosthetic heart valve: Secondary | ICD-10-CM

## 2020-10-29 DIAGNOSIS — I4892 Unspecified atrial flutter: Secondary | ICD-10-CM

## 2020-10-29 DIAGNOSIS — I359 Nonrheumatic aortic valve disorder, unspecified: Secondary | ICD-10-CM

## 2020-10-29 LAB — POCT INR: INR: 3.3 — AB (ref 2.0–3.0)

## 2020-10-29 NOTE — Patient Instructions (Signed)
Decrease warfarin to 1 tablet daily except 1/2 tablet on Thursdays Continue greens Recheck in 4 weeks Please use Code [phone]

## 2020-10-31 LAB — COLOGUARD: COLOGUARD: POSITIVE — AB

## 2020-11-26 ENCOUNTER — Ambulatory Visit: Payer: Medicaid Other | Admitting: *Deleted

## 2020-11-26 DIAGNOSIS — I4892 Unspecified atrial flutter: Secondary | ICD-10-CM

## 2020-11-26 DIAGNOSIS — Z5181 Encounter for therapeutic drug level monitoring: Secondary | ICD-10-CM

## 2020-11-26 LAB — POCT INR: INR: 2.4 (ref 2.0–3.0)

## 2020-11-26 NOTE — Patient Instructions (Signed)
Continue warfarin 1 tablet daily except 1/2 tablet on Thursdays Continue greens Recheck in 4 weeks Please use Code [phone]

## 2020-12-01 ENCOUNTER — Other Ambulatory Visit: Payer: Self-pay | Admitting: Cardiovascular Disease

## 2020-12-02 MED ORDER — WARFARIN SODIUM 5 MG PO TABS: ORAL_TABLET | ORAL | 0 refills | Status: DC

## 2020-12-02 NOTE — Telephone Encounter (Signed)
Prescription refill request received for warfarin Lov: [place], 06/01/2020 Next INR check: 10/27 Warfarin tablet strength: 5mg 

## 2020-12-13 ENCOUNTER — Other Ambulatory Visit: Payer: Self-pay | Admitting: Cardiovascular Disease

## 2020-12-13 DIAGNOSIS — E782 Mixed hyperlipidemia: Secondary | ICD-10-CM

## 2020-12-16 ENCOUNTER — Telehealth: Payer: Self-pay | Admitting: *Deleted

## 2020-12-16 NOTE — Telephone Encounter (Signed)
Dr. Elsworth Soho My breztri needs a prior authorization. I need as soon as possible.  Thanks  [employer]

## 2020-12-17 ENCOUNTER — Other Ambulatory Visit: Payer: Self-pay

## 2020-12-17 ENCOUNTER — Other Ambulatory Visit (HOSPITAL_COMMUNITY): Payer: Self-pay

## 2020-12-17 MED ORDER — ALBUTEROL SULFATE HFA 108 (90 BASE) MCG/ACT IN AERS: 2.0000 | INHALATION_SPRAY | Freq: Four times a day (QID) | RESPIRATORY_TRACT | 6 refills | Status: DC | PRN

## 2020-12-17 MED ORDER — IPRATROPIUM BROMIDE 0.02 % IN SOLN: 0.5000 mg | Freq: Four times a day (QID) | RESPIRATORY_TRACT | 12 refills | Status: DC

## 2020-12-17 MED ORDER — ALBUTEROL SULFATE HFA 108 (90 BASE) MCG/ACT IN AERS: 1.0000 | INHALATION_SPRAY | Freq: Four times a day (QID) | RESPIRATORY_TRACT | 6 refills | Status: DC | PRN

## 2020-12-17 NOTE — Telephone Encounter (Signed)
Dr. Elsworth Soho  I was wondering if you would call me in ipratropium bromide and albuter sulfate inhalation solution. I have tried this and it helps me better than the other albuterol nebulizer solution.  Thanks  [name]   Dr. Elsworth Soho please advise if this is okay.

## 2020-12-17 NOTE — Telephone Encounter (Signed)
Spoke with pt on via phone. Breztri did not need a PA. PA needed on different medication not by Dr. Elsworth Soho. Pt has picked up Breztri since this message.

## 2020-12-29 ENCOUNTER — Other Ambulatory Visit: Payer: Self-pay | Admitting: Cardiovascular Disease

## 2020-12-30 NOTE — Telephone Encounter (Signed)
Prescription refill request received for warfarin Lov: 06/01/20 Lindsay Bonilla)  Next INR check: 12/31/20 Warfarin tablet strength: 5mg   Appropriate dose and refill sent to requested pharmacy.

## 2020-12-31 ENCOUNTER — Ambulatory Visit: Payer: Medicaid Other | Admitting: *Deleted

## 2020-12-31 DIAGNOSIS — Z5181 Encounter for therapeutic drug level monitoring: Secondary | ICD-10-CM

## 2020-12-31 DIAGNOSIS — I4892 Unspecified atrial flutter: Secondary | ICD-10-CM

## 2020-12-31 DIAGNOSIS — Z952 Presence of prosthetic heart valve: Secondary | ICD-10-CM | POA: Diagnosis not present

## 2020-12-31 LAB — POCT INR: INR: 2.9 (ref 2.0–3.0)

## 2020-12-31 NOTE — Patient Instructions (Signed)
Continue warfarin 1 tablet daily except 1/2 tablet on Thursdays Continue greens Recheck in 4 weeks Please use Code [phone]

## 2021-01-08 ENCOUNTER — Other Ambulatory Visit (HOSPITAL_COMMUNITY)
Admission: RE | Admit: 2021-01-08 | Discharge: 2021-01-08 | Disposition: A | Discharge status: Home / Self Care | Payer: Medicaid Other | Source: Ambulatory Visit | Attending: Pulmonary Disease | Admitting: Pulmonary Disease

## 2021-01-08 DIAGNOSIS — Z01818 Encounter for other preprocedural examination: Secondary | ICD-10-CM

## 2021-01-12 ENCOUNTER — Ambulatory Visit (HOSPITAL_COMMUNITY): Payer: Medicaid Other

## 2021-01-12 ENCOUNTER — Encounter (HOSPITAL_COMMUNITY): Payer: Medicaid Other

## 2021-01-28 ENCOUNTER — Ambulatory Visit: Payer: Medicaid Other | Admitting: *Deleted

## 2021-01-28 DIAGNOSIS — Z952 Presence of prosthetic heart valve: Secondary | ICD-10-CM | POA: Diagnosis not present

## 2021-01-28 DIAGNOSIS — I4892 Unspecified atrial flutter: Secondary | ICD-10-CM

## 2021-01-28 DIAGNOSIS — Z5181 Encounter for therapeutic drug level monitoring: Secondary | ICD-10-CM

## 2021-01-28 LAB — POCT INR: INR: 2.9 (ref 2.0–3.0)

## 2021-01-28 NOTE — Patient Instructions (Signed)
Continue warfarin 1 tablet daily except 1/2 tablet on Thursdays Continue greens Recheck in 6 weeks Please use Code [phone]

## 2021-02-10 ENCOUNTER — Encounter: Payer: Self-pay | Admitting: Internal Medicine

## 2021-02-12 ENCOUNTER — Other Ambulatory Visit: Payer: Self-pay | Admitting: Cardiovascular Disease

## 2021-02-12 NOTE — Telephone Encounter (Signed)
This is a [place] pt °

## 2021-03-03 ENCOUNTER — Other Ambulatory Visit: Payer: Self-pay | Admitting: Pulmonary Disease

## 2021-03-11 ENCOUNTER — Telehealth: Payer: Self-pay

## 2021-03-11 ENCOUNTER — Ambulatory Visit (INDEPENDENT_AMBULATORY_CARE_PROVIDER_SITE_OTHER): Payer: Medicaid Other | Admitting: Internal Medicine

## 2021-03-11 ENCOUNTER — Encounter: Payer: Self-pay | Admitting: Internal Medicine

## 2021-03-11 ENCOUNTER — Ambulatory Visit: Payer: Medicaid Other | Admitting: *Deleted

## 2021-03-11 VITALS — BP 118/60 | HR 64 | Ht 63.0 in | Wt 177.0 lb

## 2021-03-11 DIAGNOSIS — R195 Other fecal abnormalities: Secondary | ICD-10-CM | POA: Diagnosis not present

## 2021-03-11 DIAGNOSIS — I4892 Unspecified atrial flutter: Secondary | ICD-10-CM | POA: Diagnosis not present

## 2021-03-11 DIAGNOSIS — Z5181 Encounter for therapeutic drug level monitoring: Secondary | ICD-10-CM

## 2021-03-11 DIAGNOSIS — Z800 Family history of malignant neoplasm of digestive organs: Secondary | ICD-10-CM | POA: Diagnosis not present

## 2021-03-11 LAB — POCT INR: INR: 3.7 — AB (ref 2.0–3.0)

## 2021-03-11 NOTE — Progress Notes (Signed)
Chief Complaint: Positive Cologuard  HPI : 61 year old female with history of severe AS s/p mech AVR, A-flutter, thoracic aortic aneurysm s/p Bentall procedure, HFpEF, COPD, GERD presents with positive Cologuard  Patient had a positive Cologuard in 10/31/2020. Brother died of colon cancer recently, and he was diagnosed at age 17. She has occasionally noticed blood in her stools in the past. The blood is always bright red blood and there only small amounts.  She believes that this bleeding is due to hemorrhoids.  Has not had any rectal bleeding recently.  Denies weight loss or changes in bowel habits. She is on Linzess to help her have a BM since she is on chronic opioids. She uses oxygen at night time due to her COPD. She does have issues with GERD but these are well controlled and her reflux medications. Last colonoscopy 2016 that was normal  Wt Readings from Last 3 Encounters:  03/11/21 177 lb (80.3 kg)  10/15/20 173 lb (78.5 kg)  06/01/20 186 lb (84.4 kg)    Past Medical History:  Diagnosis Date   Anxiety    Aortic aneurysm (HCC)    Aortic insufficiency    a. s/p Bentall with mechanical AVR 4/17 >> FU Echo 6/17: Mild LVH, EF 60-65%, normal wall motion, ventricular septum with paradoxical septal motion, St. Jude mechanical AVR functioning  normally with no regurgitation (mean 6 mmHg), MAC, trivial MR, mild RVE, trivial TR   Aortic stenosis, severe    Bicuspid valve   Arthritis    Asthma    Atrial flutter (HCC)    Bitten or stung by nonvenomous insect and other nonvenomous arthropods, initial encounter    Bronchitis    Chronic diastolic CHF (congestive heart failure) (HCC)    Chronic respiratory failure with hypoxia (HCC)    COPD (chronic obstructive pulmonary disease) (HCC)    DDD (degenerative disc disease), cervical    Depression    Endocarditis, valve unspecified    GERD (gastroesophageal reflux disease)    Hard of hearing    Heart failure (HCC)    Heart failure, unspecified  (HCC)    Hemorrhage, not elsewhere classified    History of pneumonia    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   Hyperkalemia    Low back pain    Migraine headache    Mild CAD    Other chronic pain    Palpitations    PAT (paroxysmal atrial tachycardia) (HCC)    PONV (postoperative nausea and vomiting)    "only once"   Premature atrial contractions    PVC's (premature ventricular contractions)    Shortness of breath    Nodule left lung CT done 8/6   Sinus bradycardia    Stress incontinence    Unspecified osteoarthritis, unspecified site    Valvular heart disease      Past Surgical History:  Procedure Laterality Date   ABDOMINAL HYSTERECTOMY     ANTERIOR CERVICAL DECOMP/DISCECTOMY FUSION  10/05/2011   Procedure: ANTERIOR CERVICAL DECOMPRESSION/DISCECTOMY FUSION 2 LEVELS;  Surgeon: Floyce Stakes, MD;  Location: [facility];  Service: Neurosurgery;  Laterality: N/A;  Cervical five - six , six - seven Anterior cervical decompression/diskectomy/fusion/plate   ANTERIOR CRUCIATE LIGAMENT REPAIR Right    AORTIC VALVE REPLACEMENT     ARTHROSCOPIC REPAIR ACL  rt   BENTALL PROCEDURE N/A 06/15/2015   Procedure: BENTALL PROCEDURE; HEMI-ARCH REPAIR WITH #23 [place]UDE MECHANICAL AVR CONDUIT AND #28 HEMASHIELD PLATINUM GRAFT;  Surgeon:  Gaye Pollack, MD;  Location: [place];  Service: Open Heart Surgery;  Laterality: N/A;   CARDIAC CATHETERIZATION N/A 05/21/2015   Procedure: Right/Left Heart Cath and Coronary Angiography;  Surgeon: [name], MD;  Location: [place] [facility];  Service: Cardiovascular;  Laterality: N/A;   CHOLECYSTECTOMY     COLONOSCOPY  10/13/05   DIAGNOSTIC LAPAROSCOPY     JOINT REPLACEMENT Right 2016   KNEE ARTHROSCOPY     rt x4  x1 lft   MYRINGOPLASTY W/ FAT GRAFT     graft from behind ear   SIGMOIDOSCOPY  10/13/05, 09/19/05   TEE WITHOUT CARDIOVERSION N/A 06/15/2015   Procedure: TRANSESOPHAGEAL ECHOCARDIOGRAM (TEE);  Surgeon: Gaye Pollack, MD;  Location: [place];  Service: Open Heart Surgery;  Laterality: N/A;   TONSILLECTOMY  2005   TUBAL LIGATION     TYMPANOSTOMY TUBE PLACEMENT     Family History  Problem Relation Age of Onset   Cancer Mother        Type unknown-? lung   Heart attack Father    Asthma Brother    Colon cancer Brother        dx at age 57   Esophageal cancer Maternal Grandfather        smoker   Diabetes Other        family history   Hyperlipidemia Other        family history   Social History   Tobacco Use   Smoking status: Former    Packs/day: 0.10    Years: 30.00    Pack years: 3.00    Types: Cigarettes    Quit date: 04/29/2015    Years since quitting: 5.8   Smokeless tobacco: Never  Vaping Use   Vaping Use: Never used  Substance Use Topics   Alcohol use: No    Alcohol/week: 0.0 standard drinks   Drug use: No   Current Outpatient Medications  Medication Sig Dispense Refill   albuterol (VENTOLIN HFA) 108 (90 Base) MCG/ACT inhaler Inhale 2 puffs into the lungs every 6 (six) hours as needed for wheezing or shortness of breath. 8 g 6   amoxicillin (AMOXIL) 500 MG capsule TAKE 4 TABLETS BY MOUTH 30-60 MINUTES PRIOR TO DENTAL PROCEDURES 8 capsule 1   Ascorbic Acid (VITAMIN C) 1000 MG tablet Take 1,000 mg by mouth daily.     aspirin EC 81 MG tablet Take 1 tablet (81 mg total) by mouth daily.     BREZTRI AEROSPHERE 160-9-4.8 MCG/ACT AERO INHALE 2 PUFFS INTO THE LUNGS IN THE MORNING AND AT BEDTIME. 10.7 g 0   cholecalciferol (VITAMIN D3) 25 MCG (1000 UNIT) tablet Take 1,000 Units by mouth daily.     EPINEPHrine (EPIPEN) 0.3 mg/0.3 mL IJ SOAJ injection Inject 0.3 mg into the muscle as needed. For allergic reaction     Ferrous Gluconate (IRON 27 PO) Take 1 tablet by mouth daily.     furosemide (LASIX) 80 MG tablet Take 1 tablet (80 mg total) by mouth 2 (two) times daily. 60 tablet 11   ipratropium (ATROVENT) 0.02 % nebulizer solution Take 2.5 mLs (0.5 mg total) by nebulization 4 (four) times  daily. 75 mL 12   LINZESS 72 MCG capsule Take 72 mcg by mouth every morning.     losartan (COZAAR) 25 MG tablet TAKE 1 TABLET ONCE DAILY. 30 tablet 6   magnesium oxide (MAG-OX) 400 MG tablet Take 1 tablet (400 mg total) by mouth daily. 90 tablet 3  metolazone (ZAROXOLYN) 2.5 MG tablet TAKE 1 TABLET BY MOUTH ON MONDAY, WEDNESDAY AND FRIDAY. 12 tablet 9   montelukast (SINGULAIR) 10 MG tablet Take 1 tablet (10 mg total) by mouth at bedtime. 30 tablet 3   oxyCODONE-acetaminophen (PERCOCET) 10-325 MG tablet Take 1 tablet by mouth every 4 (four) hours as needed for pain.     potassium chloride (KLOR-CON) 10 MEQ tablet TAKE EIGHT TABLETS BY MOUTH TWICE DAILY 480 tablet 6   pravastatin (PRAVACHOL) 40 MG tablet TAKE 1 TABLET EVERY EVENING. 30 tablet 0   promethazine (PHENERGAN) 25 MG tablet promethazine 25 mg tablet  TAKE 1/2 TABLET BY MOUTH FOUR TIMES DAILY AS NEEDED     warfarin (COUMADIN) 5 MG tablet TAKE 1 TABLET BY MOUTH DAILY AS DIRECTED BY THE COUMADIN CLINIC. 35 tablet 3   omeprazole (PRILOSEC OTC) 20 MG tablet Take 1 tablet by mouth daily.     No current facility-administered medications for this visit.   Allergies  Allergen Reactions   Beta Adrenergic Blockers Anaphylaxis   Doxycycline Other (See Comments)    "Swelling, dizziness, sleepy, talking out of my head, almost caused liver failure"   Peanut-Containing Drug Products Anaphylaxis, Shortness Of Breath, Swelling and Other (See Comments)    Swelling of the throat   Shrimp [Shellfish Allergy] Anaphylaxis   Vancomycin Swelling    Rash and severe swelling   Other     Red Meat and Dairy Products    Avelox [Moxifloxacin Hcl In Nacl] Rash   Ciprofloxacin Nausea And Vomiting   Cleocin [Clindamycin Hcl] Rash   Moxifloxacin Rash   Sulfamethoxazole-Trimethoprim Rash   Tape Other (See Comments)    White clear itches, rash     Review of Systems: All systems reviewed and negative except where noted in HPI.   Physical Exam: BP 118/60     Pulse 64    Ht _0  (1.6 m)    Wt 177 lb (80.3 kg)    BMI 31.35 kg/m  Constitutional: Pleasant,well-developed, female in no acute distress. HEENT: Normocephalic and atraumatic. Conjunctivae are normal. No scleral icterus. Cardiovascular: Normal rate, regular rhythm.  Pulmonary/chest: Effort normal and breath sounds normal. No wheezing, rales or rhonchi. Abdominal: Soft, nondistended, nontender. Bowel sounds active throughout. There are no masses palpable. No hepatomegaly. Extremities: No edema Neurological: Alert and oriented to person place and time. Skin: Skin is warm and dry. No rashes noted. Psychiatric: Normal mood and affect. Behavior is normal.  Labs 04/2020: CBC with elevated WBC of 15.5. CMP with mildly low potassium of 3.3 and mildly elevated T bili of 1.3. Lipase mildly elevated at 78  Labs 09/2020: Positive Cologuard  Labs 01/2021: INR 2.9  Chest CT w/o contrast 08/10/18: IMPRESSION: 1. There are densely sclerotic reactive endplates and osteophytes of T9-T10, corresponding to findings of prior chest radiograph. No suspicious mass or nodule. 2. Severe emphysema and diffuse bilateral bronchial wall thickening. 3. Coronary artery disease and postoperative findings of aortic valve and root graft repair, with probable reconstruction of the pulmonary outflow tract.  EGD 02/16/06:   Flexible sigmoidoscopy 02/16/06:  Path: SIGMOID COLON, BIOPSY: BENIGN COLONIC MUCOSA WITH NO SIGNIFICANT   HISTOPATHOLOGIC CHANGES. (SEE COMMENT)   COMMENT   Features of microscopic colitis are not seen  Colonoscopy 07/2014:  Path:   ASSESSMENT AND PLAN: Positive Cologuard Family history of colon cancer Patient presents with a positive Cologuard test.  She does have strong family history of colon cancer in her brother that was diagnosed in his 70s.  Her last colonoscopy in 2016 was unremarkable.  Since patient is on oxygen at night time at home, will plan for colonoscopy to be done in  the hospital.  Her warfarin will be need to be held before her procedure - Colonoscopy [facility]. Will check with her cardiologist Dr. [name] about holding her warfarin before the procedure  [name], MD

## 2021-03-11 NOTE — Telephone Encounter (Signed)
[facility] Pre-operative Risk Assessment     Request for surgical clearance:     Endoscopy Procedure  What type of surgery is being performed?     Colonoscopy  When is this surgery scheduled?     04/27/21  What type of clearance is required ?   Pharmacy  Are there any medications that need to be held prior to surgery and how long? Warfarin 5  days.  Practice name and name of physician performing surgery?      [facility]  What is your office phone and fax number?      Phone- [phone]  [phone]  Anesthesia type (None, local, MAC, general) ?       MAC

## 2021-03-11 NOTE — Telephone Encounter (Signed)
Clinical pharmacist to review Coumadin. Patient has #23 [place] AVR CONDUIT AND #28 HEMASHIELD PLATINUM GRAFT placed on 06/15/2015

## 2021-03-11 NOTE — Patient Instructions (Signed)
Hold warfarin tonight, take 1/2 tablet tomorrow then resume 1 tablet daily except 1/2 tablet on Thursdays Continue greens Recheck in 4 weeks Please use Code [phone]

## 2021-03-11 NOTE — Patient Instructions (Signed)
If you are age 60 or older, your body mass index should be between 23-30. Your Body mass index is 31.35 kg/m. If this is out of the aforementioned range listed, please consider follow up with your Primary Care Provider.  If you are age 31 or younger, your body mass index should be between 19-25. Your Body mass index is 31.35 kg/m. If this is out of the aformentioned range listed, please consider follow up with your Primary Care Provider.   You have been scheduled for a colonoscopy. Please follow written instructions given to you at your visit today.  Please pick up your prep supplies at the pharmacy within the next 1-3 days. If you use inhalers (even only as needed), please bring them with you on the day of your procedure.   The  GI providers would like to encourage you to use [facility] to communicate with providers for non-urgent requests or questions.  Due to long hold times on the telephone, sending your provider a message by [facility] may be a faster and more efficient way to get a response.  Please allow 48 business hours for a response.  Please remember that this is for non-urgent requests.   It was a pleasure to see you today!  Thank you for trusting me with your gastrointestinal care!    Dayna Barker, MD

## 2021-03-12 NOTE — Telephone Encounter (Signed)
° ° °  Patient Name: Lindsay Bonilla  DOB: 1960-10-30 MRN: [id number]  Primary Cardiologist: Lauree Chandler, MD  Chart reviewed as part of pre-operative protocol coverage. Given past medical history and time since last visit, based on ACC/AHA guidelines, [name] would be at acceptable risk for the planned procedure without further cardiovascular testing.   The patient was advised that if she develops new symptoms prior to surgery to contact our office to arrange for a follow-up visit, and she verbalized understanding.  Coumadin clinic will set up Lovenox bridging around the procedure.   I will route this recommendation to the requesting party via Epic fax function and remove from pre-op pool.  Please call with questions.  [place], [place] 03/12/2021, 1:04 PM

## 2021-03-12 NOTE — Telephone Encounter (Signed)
Patient with diagnosis of atrial fibrillation AND mechanical aortic valve on warfarin for anticoagulation.    Procedure: colonoscopy Date of procedure: 04/27/21   CHA2DS2-VASc Score = 3   This indicates a 3.2% annual risk of stroke. The patient's score is based upon: CHF History: 1 HTN History: 0 Diabetes History: 0 Stroke History: 0 Vascular Disease History: 1 Age Score: 0 Gender Score: 1    CrCl 82 Platelet count 268  Per office protocol, patient can hold warfain for 5 days prior to procedure.   Patient WILL need bridging with Lovenox (enoxaparin) around procedure.  Patient followed by [facility] in [place].  RN aware and will bridge at next appointment.

## 2021-03-12 NOTE — Telephone Encounter (Signed)
See above

## 2021-03-12 NOTE — Telephone Encounter (Signed)
Dr. [name] to review. Low risk procedure. Speaking with Lindsay Bonilla, she denies any chest pain or worsening dyspnea. However, she mentions in the past 6 month, she feels her mechanical valve is louder than usual. She says she is not sure if it is real or her imagination, but she does feel the mechanical valve is louder. Beyond that, she has no complaints of any symptom. Last echo was in 2021.   This is a pharmacy clearance instead of pharmacy and medical clearance, but wish to make you aware and see if you want a earlier repeat echo prior to clearance?  Please forward your response to [facility] PREOP

## 2021-03-16 ENCOUNTER — Ambulatory Visit: Payer: Medicaid Other | Admitting: Pulmonary Disease

## 2021-03-17 ENCOUNTER — Other Ambulatory Visit: Payer: Self-pay

## 2021-03-17 ENCOUNTER — Ambulatory Visit: Payer: Medicaid Other | Admitting: Pulmonary Disease

## 2021-03-17 ENCOUNTER — Encounter: Payer: Self-pay | Admitting: Pulmonary Disease

## 2021-03-17 VITALS — BP 130/84 | HR 64 | Temp 98.4°F | Ht 63.0 in | Wt 182.0 lb

## 2021-03-17 DIAGNOSIS — J9611 Chronic respiratory failure with hypoxia: Secondary | ICD-10-CM

## 2021-03-17 DIAGNOSIS — J432 Centrilobular emphysema: Secondary | ICD-10-CM

## 2021-03-17 DIAGNOSIS — F17200 Nicotine dependence, unspecified, uncomplicated: Secondary | ICD-10-CM

## 2021-03-17 MED ORDER — DEXTROMETHORPHAN POLISTIREX ER 30 MG/5ML PO SUER: 30.0000 mg | Freq: Two times a day (BID) | ORAL | 0 refills | Status: AC | PRN

## 2021-03-17 MED ORDER — BREZTRI AEROSPHERE 160-9-4.8 MCG/ACT IN AERO: 2.0000 | INHALATION_SPRAY | Freq: Two times a day (BID) | RESPIRATORY_TRACT | 11 refills | Status: DC

## 2021-03-17 NOTE — Progress Notes (Signed)
° °  Subjective:    Patient ID: Lindsay Bonilla, female    DOB: 1960/06/06, 61 y.o.   MRN: [id number]  HPI  61 yo ex-smoker for FU of COPD and chronic hypoxic respiratory failure on 2 L of oxygen -quit smoking 2017   PMH - s/p Aortic Valve Replacement/s/p Bentall procedure per Dr. Cyndia Bent on 05/2015 St. Jude Mechanical valved graft , original AVR in 1998 - sinus node dysfunction on Cardizem - HFpEF- on Lasix  -01/2020 covid infection -chronic back pain on percocet   Chief Complaint  Patient presents with   Follow-up    Patient feels like overall breathing is about the same.  Still using o2 at night  Breztri working well    She had an ED visit 10/2020 which I reviewed, COVID test was negative chest x-ray did not show any infiltrates.  She was told that she may have RSV.  She required 2-3 courses of antibiotics and prednisone before she felt improved. She remains on Percocet for back pain. Is on Coumadin and has a lot of bruising on her forearms. We had schedule PFTs but she was not able to obtain because she was sick She reports and nonproductive cough which is different from the cough that she had during her exacerbation. She remains on Breztri and is compliant she uses oxygen almost daily  Significant tests/ events reviewed  PFTs 04/2015 >>ratio 33, FEV1 0.69 /25% , improved to 0.95/35% with BD, FVC 61%, TLC 126 %, DLCO 80   CT chest 07/2018 Coronary artery calcifications. Status post aortic valve and root graft repair, with probable reconstruction of the pulmonary outflow tract . Severe emphysema. Mild, diffuse bilateral bronchial wall thickening.   Echo 04/2019 nml LV fn, RVSP 37   03/2017 NPSG >> no OSA, min desatn 77%   Review of Systems neg for any significant sore throat, dysphagia, itching, sneezing, nasal congestion or excess/ purulent secretions, fever, chills, sweats, unintended wt loss, pleuritic or exertional cp, hempoptysis, orthopnea pnd or change in chronic leg  swelling. Also denies presyncope, palpitations, heartburn, abdominal pain, nausea, vomiting, diarrhea or change in bowel or urinary habits, dysuria,hematuria, rash, arthralgias, visual complaints, headache, numbness weakness or ataxia.     Objective:   Physical Exam  Gen. Pleasant, well-nourished, in no distress ENT - no thrush, no pallor/icterus,no post nasal drip Neck: No JVD, no thyromegaly, no carotid bruits Lungs: no use of accessory muscles, no dullness to percussion, decreased  without rales or rhonchi  Cardiovascular: Rhythm regular, heart sounds  normal, no murmurs or gallops, no peripheral edema Musculoskeletal: No deformities, no cyanosis or clubbing  Skin- extensive bruising both forearms       Assessment & Plan:

## 2021-03-17 NOTE — Assessment & Plan Note (Signed)
She quit in 2017. Will refer to lung cancer screening program

## 2021-03-17 NOTE — Assessment & Plan Note (Signed)
Continue 2 L of oxygen at all times. Referral to pulmonary rehab, she is hesitant to go to a center

## 2021-03-17 NOTE — Patient Instructions (Addendum)
X Lung cancer screening program  X reschedule pFTs  X refills on Breztri  X Trial of Dextromethorphan 30 mg twice daily as needed for cough

## 2021-03-17 NOTE — Assessment & Plan Note (Signed)
° °  X reschedule pFTs  X refills on Breztri  X Trial of Dextromethorphan 30 mg twice daily as needed for cough

## 2021-04-04 IMAGING — DX DG CHEST 1V PORT
1 series · 1 of 1 positions shown · non-contrast
Comparison: None.

CLINICAL DATA: Increased short of breath

EXAM:
PORTABLE CHEST 1 VIEW

[chest ap]
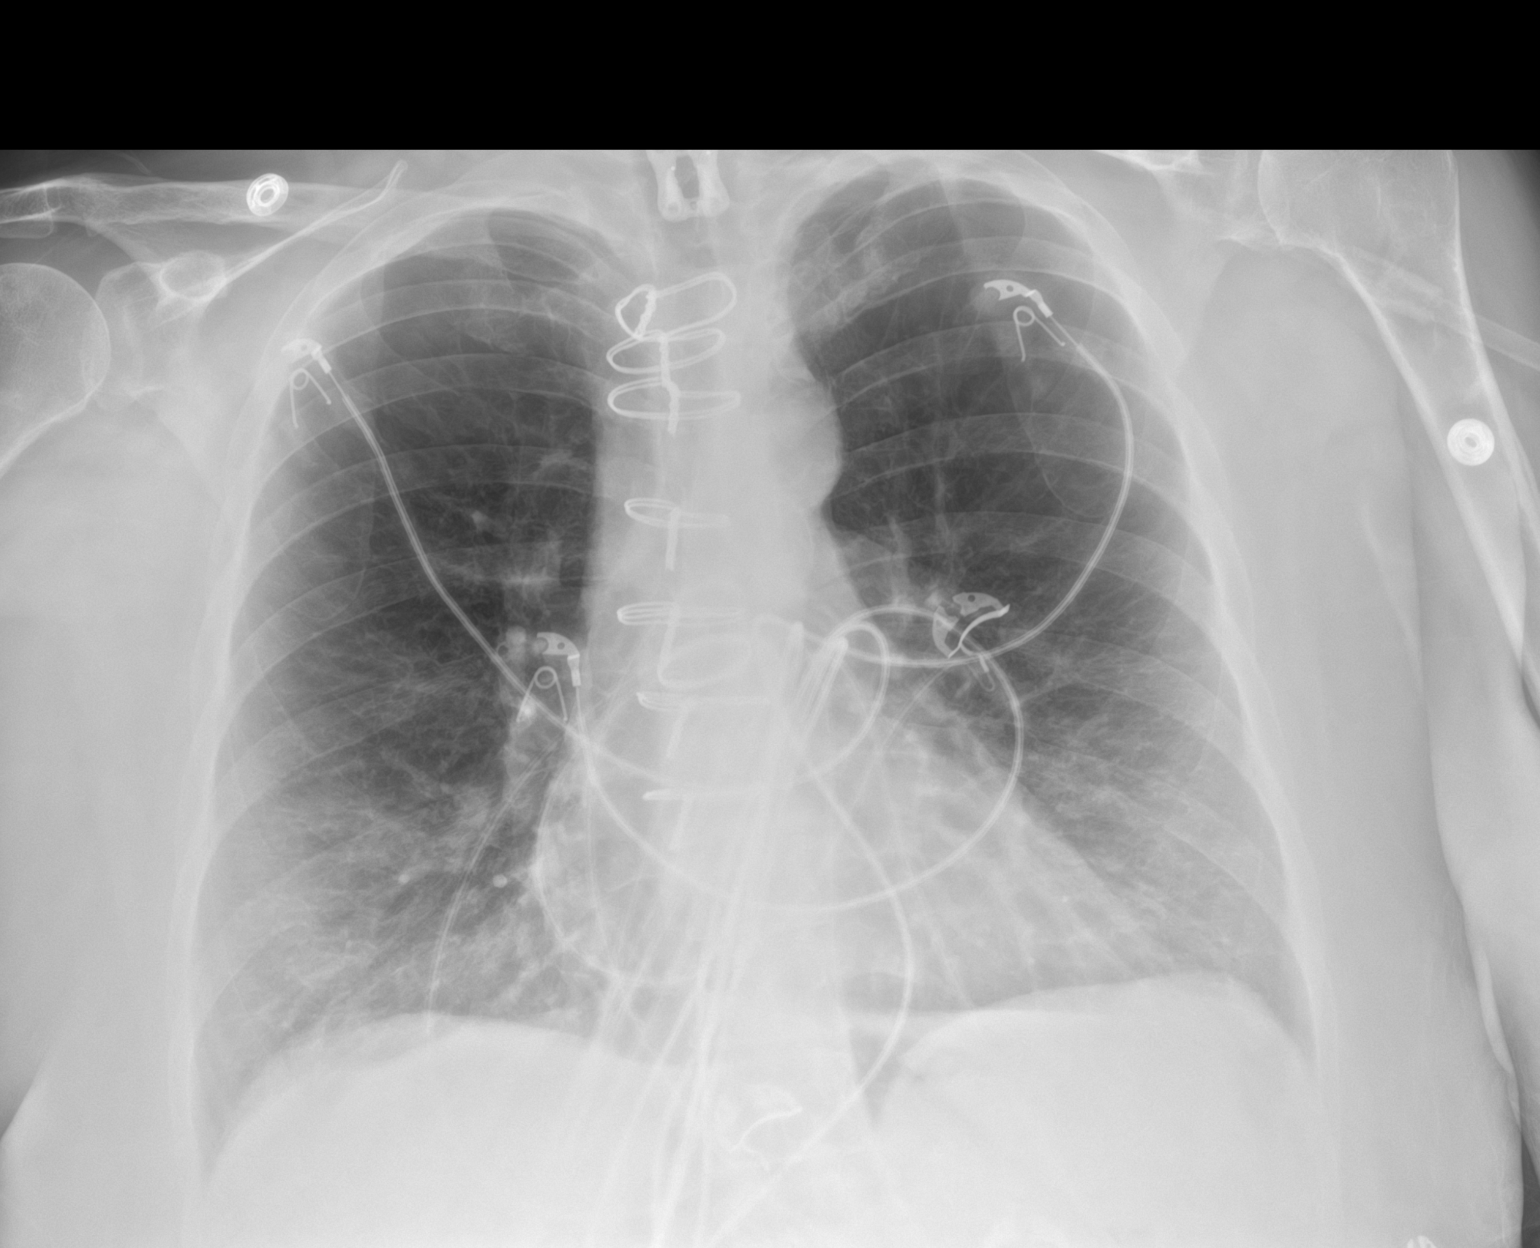

[1 of 1 positions shown; findings below may reference images not displayed]

FINDINGS: Anterior cervical fusion. Sternotomy wires overlie normal cardiac
silhouette. No effusion, infiltrate pneumothorax. Mild linear
interstitial markings at the lung bases. Emphysematous upper lobes.
IMPRESSION: 1. Mild interstitial edema pattern at the lung bases.
2. Emphysema in the upper lobes.

## 2021-04-08 ENCOUNTER — Ambulatory Visit: Payer: Medicaid Other | Admitting: *Deleted

## 2021-04-08 DIAGNOSIS — Z5181 Encounter for therapeutic drug level monitoring: Secondary | ICD-10-CM | POA: Diagnosis not present

## 2021-04-08 DIAGNOSIS — I4892 Unspecified atrial flutter: Secondary | ICD-10-CM | POA: Diagnosis not present

## 2021-04-08 LAB — POCT INR: INR: 2.7 (ref 2.0–3.0)

## 2021-04-08 MED ORDER — ENOXAPARIN SODIUM 80 MG/0.8ML IJ SOSY
80.0000 mg | PREFILLED_SYRINGE | Freq: Two times a day (BID) | INTRAMUSCULAR | 0 refills | Status: DC
Start: 2021-04-22 — End: 2021-07-05

## 2021-04-08 NOTE — Patient Instructions (Addendum)
Description   Code [phone] Continue to take warfarin 1 tablet daily except for 1/2 a tablet on Thursdays. Please follow bridge instructions:   Normal dose of warfarin:  Continue to take warfarin 1 tablet daily excpet for 1/2 a tablet on Thursdays.  Call [phone] for questions or concerns.       2/22: Last dose of warfarin.  2/23: No warfarin or enoxaparin (Lovenox).  2/24: Inject enoxaparin 80mg  in the fatty abdominal tissue at least 2 inches from the belly button twice a day about 12 hours apart, 8am and 8pm rotate sites. No warfarin.  2/25: Inject enoxaparin in the fatty tissue every 12 hours, 8am and 8pm. No warfarin.  2/26: Inject enoxaparin in the fatty tissue every 12 hours, 8am and 8pm. No warfarin.  2/27: Inject enoxaparin in the fatty tissue in the morning at 8 am (No PM dose). No warfarin.  2/28: Procedure Day - No enoxaparin - Resume warfarin in the evening or as directed by doctor (take an extra half tablet with usual dose for 2 days then resume normal dose).  3/1: Resume enoxaparin inject in the fatty tissue every 12 hours and take warfarin  3/2: Inject enoxaparin in the fatty tissue every 12 hours and take warfarin  3/3: Inject enoxaparin in the fatty tissue every 12 hours and take warfarin  3/4: Inject enoxaparin in the fatty tissue every 12 hours and take warfarin  3/5: Inject enoxaparin in the fatty tissue every 12 hours and take warfarin  3/6:Inject enoxaparin in the fatty tissue every 12 hours and take warfarin  3/7: INR check

## 2021-04-19 ENCOUNTER — Encounter (HOSPITAL_COMMUNITY): Payer: Self-pay | Admitting: Internal Medicine

## 2021-04-26 ENCOUNTER — Encounter (HOSPITAL_COMMUNITY): Payer: Self-pay | Admitting: Certified Registered"

## 2021-04-26 ENCOUNTER — Telehealth: Payer: Self-pay | Admitting: Gastroenterology

## 2021-04-26 NOTE — Telephone Encounter (Signed)
Patient's daughter called in about difficulty with bowel prep tonight.  In fact she just tried taking what sounds like a Dulcolax and vomited it, has not even tried taking the bowel prep yet.  She will hold off on Dulcolax.  She has some Phenergan at home for nausea, recommended she take a dose of that, mix the liquid prep and put it in the refrigerator to chill it, and try the MiraLAX prep later tonight.  Hopefully with antiemetics she can get through this okay.  If not she will call us back for further advice.  Lindsay Bonilla

## 2021-04-27 ENCOUNTER — Ambulatory Visit (HOSPITAL_COMMUNITY): Admission: RE | Admit: 2021-04-27 | Payer: Medicaid Other | Source: Home / Self Care | Admitting: Internal Medicine

## 2021-04-27 SURGERY — COLONOSCOPY WITH PROPOFOL
Anesthesia: Monitor Anesthesia Care

## 2021-04-27 NOTE — Telephone Encounter (Signed)
Lindsay Bonilla, Lindsay Bonilla (dtr) called indicating that that the pt will not be able to attend [facility] colonoscopy today (04/27/21) d/t severe nausea and vomiting with prep. Further added she and the pt spoke with Dr. Havery Moros last night, conversation documented below. Procedure has been canceled per pt and dtr request. Asked what the next steps will be following this concern. Extended OV, soonest available. Declined to schedule anything until they return from their [place] trip. Pt scheduled for f/u appt on 06/02/21 @ 340pm per pt/dtr request.

## 2021-05-03 ENCOUNTER — Other Ambulatory Visit: Payer: Self-pay | Admitting: Cardiovascular Disease

## 2021-05-04 ENCOUNTER — Ambulatory Visit: Payer: Medicaid Other | Admitting: *Deleted

## 2021-05-04 DIAGNOSIS — Z5181 Encounter for therapeutic drug level monitoring: Secondary | ICD-10-CM | POA: Diagnosis not present

## 2021-05-04 DIAGNOSIS — I4892 Unspecified atrial flutter: Secondary | ICD-10-CM

## 2021-05-04 DIAGNOSIS — Z952 Presence of prosthetic heart valve: Secondary | ICD-10-CM

## 2021-05-04 LAB — POCT INR: INR: 2.1 (ref 2.0–3.0)

## 2021-05-04 NOTE — Patient Instructions (Signed)
Code [phone] ?Pt had to cancel Colonoscopy because she was sick.  Had held warfarin x 5 days so took Lovenox as ordered. ?Continue warfarin 1 tablet daily except for 1/2 a tablet on Thursdays. Stop Lovenox ?Call [phone] for questions or concerns.  ?

## 2021-05-19 ENCOUNTER — Encounter: Payer: Self-pay | Admitting: Pulmonary Disease

## 2021-05-19 DIAGNOSIS — J432 Centrilobular emphysema: Secondary | ICD-10-CM

## 2021-05-20 NOTE — Telephone Encounter (Signed)
Patient is requesting an order for a new nebulizer and neb solutions to be sent In. Dr. Elsworth Soho please advise if this okay?  ? ? ?

## 2021-05-21 ENCOUNTER — Other Ambulatory Visit: Payer: Self-pay

## 2021-05-21 MED ORDER — IPRATROPIUM BROMIDE 0.02 % IN SOLN: 0.5000 mg | Freq: Four times a day (QID) | RESPIRATORY_TRACT | 12 refills | Status: DC

## 2021-06-02 ENCOUNTER — Ambulatory Visit: Payer: Medicaid Other | Admitting: Internal Medicine

## 2021-06-03 ENCOUNTER — Ambulatory Visit: Payer: Medicaid Other | Admitting: *Deleted

## 2021-06-03 DIAGNOSIS — I4892 Unspecified atrial flutter: Secondary | ICD-10-CM

## 2021-06-03 DIAGNOSIS — Z952 Presence of prosthetic heart valve: Secondary | ICD-10-CM

## 2021-06-03 DIAGNOSIS — Z5181 Encounter for therapeutic drug level monitoring: Secondary | ICD-10-CM

## 2021-06-03 LAB — POCT INR: INR: 2.6 (ref 2.0–3.0)

## 2021-06-03 NOTE — Patient Instructions (Signed)
Code [phone] ?Continue warfarin 1 tablet daily except for 1/2 a tablet on Thursdays.  ?Call [phone] for questions or concerns.  ?

## 2021-06-10 MED ORDER — ALBUTEROL SULFATE (2.5 MG/3ML) 0.083% IN NEBU: 2.5000 mg | INHALATION_SOLUTION | Freq: Four times a day (QID) | RESPIRATORY_TRACT | 12 refills | Status: DC | PRN

## 2021-06-10 NOTE — Addendum Note (Signed)
Addended by: Fritzi Mandes D on: 06/10/2021 03:06 PM ? ? Modules accepted: Orders ? ?

## 2021-06-10 NOTE — Telephone Encounter (Signed)
The drug store is out of the albuterol you called in. They said they had the plain albuterol but I would need a new prescription. Could you please resend.  ?Thanks, ?[name] ? ? ?Dr. Elsworth Soho please advise  ?

## 2021-06-14 ENCOUNTER — Ambulatory Visit: Payer: Medicaid Other | Admitting: Cardiovascular Disease

## 2021-06-14 ENCOUNTER — Encounter: Payer: Self-pay | Admitting: Cardiovascular Disease

## 2021-06-14 VITALS — BP 120/78 | HR 88 | Ht 63.0 in | Wt 183.2 lb

## 2021-06-14 DIAGNOSIS — I2510 Atherosclerotic heart disease of native coronary artery without angina pectoris: Secondary | ICD-10-CM

## 2021-06-14 DIAGNOSIS — I359 Nonrheumatic aortic valve disorder, unspecified: Secondary | ICD-10-CM | POA: Diagnosis not present

## 2021-06-14 DIAGNOSIS — I712 Thoracic aortic aneurysm, without rupture, unspecified: Secondary | ICD-10-CM

## 2021-06-14 DIAGNOSIS — I10 Essential (primary) hypertension: Secondary | ICD-10-CM | POA: Diagnosis not present

## 2021-06-14 DIAGNOSIS — I493 Ventricular premature depolarization: Secondary | ICD-10-CM

## 2021-06-14 DIAGNOSIS — I5032 Chronic diastolic (congestive) heart failure: Secondary | ICD-10-CM

## 2021-06-14 NOTE — Patient Instructions (Signed)
Medication Instructions:  No changes *If you need a refill on your cardiac medications before your next appointment, please call your pharmacy*   Lab Work: none If you have labs (blood work) drawn today and your tests are completely normal, you will receive your results only by: MyChart Message (if you have MyChart) OR A paper copy in the mail If you have any lab test that is abnormal or we need to change your treatment, we will call you to review the results.   Testing/Procedures: none   Follow-Up: At [facility], you and your health needs are our priority.  As part of our continuing mission to provide you with exceptional heart care, we have created designated Provider Care Teams.  These Care Teams include your primary Cardiologist (physician) and Advanced Practice Providers (APPs -  Physician Assistants and Nurse Practitioners) who all work together to provide you with the care you need, when you need it.   Your next appointment:   12 month(s)  The format for your next appointment:   In Person  Provider:   Christopher McAlhany, MD     Important Information About Sugar       

## 2021-06-14 NOTE — Progress Notes (Signed)
? ? ?Chief Complaint  ?Patient presents with  ? Follow-up  ?  Aortic valve disorder  ? ?History of Present Illness: 61 yo female with history of bicuspid aortic valve s/p Ross procedure in 1998 with thoracic aortic aneurysm and then Bentall procedure in April 2017, severe COPD, GERD, anxiety, chronic diastolic CHF,  tobacco abuse, PVCs, PACs here today for cardiac follow up. She has been followed for aortic valve insufficiency and thoracic aortic aneurysm and underwent Bentall procedure per Dr. Cyndia Bent on 06/15/15.  Cardiac cath March 2017 with mild CAD (20% RCA stenosis). Cardiac monitor June 2017 with PACs, PVCs. She has chronic diastolic CHF and is on Lasix. Normal ABI December 2018. Venous dopplers August 2019 with no evidence of DVT. Volume overload when seen in our office in October 2018. Metolazone added 3 times per week to her Lasix and her volume overload improved. Echo December 2019 with LVEF=60-65%, mild MR. She did not tolerate Cardizem due to sinus node dysfunction and was seen in EP clinic. Her cardizem was stopped. Echo March 2021 with LVEF=55-60%, trivial MR, well functioning AVR. She was seen in March 2021 in the EP clinic by Dr. Lovena Le and he reassured her that her heart rate was truly not in the 40s as she was having PVCs. She was last seen in EP clinic 05/14/20 and no changes were made. She is followed in the pulmonary office for severe COPD and is now on supplemental O2 at night and sometimes during the day.  ? ?She is here today for follow up. The patient denies any chest pain, palpitations, lower extremity edema, orthopnea, PND, dizziness, near syncope or syncope. Baseline dyspnea.  ? ?Primary Care Physician: Leeanne Rio, MD ? ?Past Medical History:  ?Diagnosis Date  ? Anxiety   ? Aortic aneurysm ([place])   ? Aortic insufficiency   ? a. s/p Bentall with mechanical AVR 4/17 >> FU Echo 6/17: Mild LVH, EF 60-65%, normal wall motion, ventricular septum with paradoxical septal motion, St. Jude  mechanical AVR functioning  normally with no regurgitation (mean 6 mmHg), MAC, trivial MR, mild RVE, trivial TR  ? Aortic stenosis, severe   ? Bicuspid valve  ? Arthritis   ? Asthma   ? Atrial flutter ([place])   ? Bitten or stung by nonvenomous insect and other nonvenomous arthropods, initial encounter   ? Bronchitis   ? Chronic diastolic CHF (congestive heart failure) ([place])   ? Chronic respiratory failure with hypoxia (H[facility])   ? COPD (chronic obstructive pulmonary disease) ([place])   ? DDD (degenerative disc disease), cervical   ? Depression   ? Endocarditis, valve unspecified   ? GERD (gastroesophageal reflux disease)   ? Hard of hearing   ? Heart failure ([place])   ? Heart failure, unspecified ([place])   ? Hemorrhage, not elsewhere classified   ? History of pneumonia   ? Homograft cardiac valve stenosis   ? Pulmonary valve homograft  ? Hyperkalemia   ? Low back pain   ? Migraine headache   ? Mild CAD   ? Other chronic pain   ? Palpitations   ? PAT (paroxysmal atrial tachycardia) ([place])   ? PONV (postoperative nausea and vomiting)   ? "only once"  ? Premature atrial contractions   ? PVC's (premature ventricular contractions)   ? Shortness of breath   ? Nodule left lung CT done 8/6  ? Sinus bradycardia   ? Stress incontinence   ? Unspecified osteoarthritis, unspecified site   ? Valvular heart  disease   ? ? ?Past Surgical History:  ?Procedure Laterality Date  ? ABDOMINAL HYSTERECTOMY    ? ANTERIOR CERVICAL DECOMP/DISCECTOMY FUSION  10/05/2011  ? Procedure: ANTERIOR CERVICAL DECOMPRESSION/DISCECTOMY FUSION 2 LEVELS;  Surgeon: Floyce Stakes, MD;  Location: [facility];  Service: Neurosurgery;  Laterality: N/A;  Cervical five - six , six - seven Anterior cervical decompression/diskectomy/fusion/plate  ? ANTERIOR CRUCIATE LIGAMENT REPAIR Right   ? AORTIC VALVE REPLACEMENT    ? ARTHROSCOPIC REPAIR ACL  rt  ? BENTALL PROCEDURE N/A 06/15/2015  ? Procedure: BENTALL PROCEDURE; HEMI-ARCH REPAIR WITH #23 ST JUDE MECHANICAL AVR CONDUIT AND  #28 HEMASHIELD PLATINUM GRAFT;  Surgeon: Gaye Pollack, MD;  Location: [facility];  Service: Open Heart Surgery;  Laterality: N/A;  ? CARDIAC CATHETERIZATION N/A 05/21/2015  ? Procedure: Right/Left Heart Cath and Coronary Angiography;  Surgeon: [name], MD;  Location: [facility];  Service: Cardiovascular;  Laterality: N/A;  ? CHOLECYSTECTOMY    ? COLONOSCOPY  10/13/05  ? DIAGNOSTIC LAPAROSCOPY    ? JOINT REPLACEMENT Right 2016  ? KNEE ARTHROSCOPY    ? rt x4  x1 lft  ? MYRINGOPLASTY W/ FAT GRAFT    ? graft from behind ear  ? SIGMOIDOSCOPY  10/13/05, 09/19/05  ? TEE WITHOUT CARDIOVERSION N/A 06/15/2015  ? Procedure: TRANSESOPHAGEAL ECHOCARDIOGRAM (TEE);  Surgeon: Gaye Pollack, MD;  Location: [place];  Service: Open Heart Surgery;  Laterality: N/A;  ? TONSILLECTOMY  2005  ? TUBAL LIGATION    ? TYMPANOSTOMY TUBE PLACEMENT    ? ? ?Current Outpatient Medications  ?Medication Sig Dispense Refill  ? albuterol (PROVENTIL) (2.5 MG/3ML) 0.083% nebulizer solution Take 3 mLs (2.5 mg total) by nebulization every 6 (six) hours as needed for wheezing or shortness of breath. 75 mL 12  ? albuterol (VENTOLIN HFA) 108 (90 Base) MCG/ACT inhaler Inhale 2 puffs into the lungs every 6 (six) hours as needed for wheezing or shortness of breath. 8 g 6  ? amoxicillin (AMOXIL) 500 MG capsule TAKE 4 TABLETS BY MOUTH 30-60 MINUTES PRIOR TO DENTAL PROCEDURES 8 capsule 1  ? aspirin EC 81 MG tablet Take 1 tablet (81 mg total) by mouth daily.    ? Budeson-Glycopyrrol-Formoterol (BREZTRI AEROSPHERE) 160-9-4.8 MCG/ACT AERO Inhale 2 puffs into the lungs in the morning and at bedtime. 10.7 g 11  ? cholecalciferol (VITAMIN D3) 25 MCG (1000 UNIT) tablet Take 1,000 Units by mouth daily.    ? EPINEPHrine (EPIPEN) 0.3 mg/0.3 mL IJ SOAJ injection Inject 0.3 mg into the muscle as needed for anaphylaxis. For allergic reaction    ? Ferrous Gluconate (IRON 27 PO) Take 27 mg by mouth daily.    ? furosemide (LASIX) 80 MG tablet Take 1 tablet (80 mg  total) by mouth 2 (two) times daily. 60 tablet 11  ? ipratropium (ATROVENT) 0.02 % nebulizer solution Take 2.5 mLs (0.5 mg total) by nebulization 4 (four) times daily. 75 mL 12  ? linaclotide (LINZESS) 145 MCG CAPS capsule Take 145 mcg by mouth every morning.    ? losartan (COZAAR) 25 MG tablet TAKE 1 TABLET ONCE DAILY. 30 tablet 6  ? magnesium oxide (MAG-OX) 400 MG tablet Take 1 tablet (400 mg total) by mouth daily. 90 tablet 3  ? metolazone (ZAROXOLYN) 2.5 MG tablet TAKE 1 TABLET BY MOUTH ON MONDAY, WEDNESDAY AND FRIDAY. 12 tablet 1  ? montelukast (SINGULAIR) 10 MG tablet Take 1 tablet (10 mg total) by mouth at bedtime. 30 tablet 3  ?  oxyCODONE-acetaminophen (PERCOCET) 10-325 MG tablet Take 1 tablet by mouth 4 (four) times daily.    ? potassium chloride (KLOR-CON) 10 MEQ tablet TAKE EIGHT TABLETS BY MOUTH TWICE DAILY 480 tablet 6  ? pravastatin (PRAVACHOL) 40 MG tablet TAKE 1 TABLET EVERY EVENING. 30 tablet 0  ? promethazine (PHENERGAN) 25 MG tablet Take 25 mg by mouth every 6 (six) hours as needed for nausea or vomiting.    ? warfarin (COUMADIN) 5 MG tablet TAKE 1 TABLET BY MOUTH DAILY AS DIRECTED BY THE COUMADIN CLINIC. (Patient taking differently: Take 5 mg by mouth at bedtime. Take 5 mg every day EXCEPT: Thursday take 2.8 mg ? ? AS DIRECTED BY THE COUMADIN CLINIC.) 35 tablet 3  ? enoxaparin (LOVENOX) 80 MG/0.8ML injection Inject 0.8 mLs (80 mg total) into the skin every 12 (twelve) hours. (Patient not taking: Reported on 06/14/2021) 16 mL 0  ? omeprazole (PRILOSEC OTC) 20 MG tablet Take 20 mg by mouth daily.    ? ?No current facility-administered medications for this visit.  ? ? ?Allergies  ?Allergen Reactions  ? Alpha-Gal   ?  Red meat  ? Beta Adrenergic Blockers Anaphylaxis  ? Doxycycline Other (See Comments)  ?  "Swelling, dizziness, sleepy, talking out of my head, almost caused liver failure"  ? Peanut-Containing Drug Products Anaphylaxis, Shortness Of Breath, Swelling and Other (See Comments)  ?  Swelling  of the throat  ? Vancomycin Swelling  ?  Rash and severe swelling  ? Other   ?  Red Meat and Dairy Products   ? Avelox [Moxifloxacin Hcl In Nacl] Rash  ? Ciprofloxacin Nausea And Vomiting  ? Cleocin [place]

## 2021-06-22 ENCOUNTER — Telehealth: Payer: Self-pay | Admitting: *Deleted

## 2021-06-22 ENCOUNTER — Encounter: Payer: Self-pay | Admitting: Internal Medicine

## 2021-06-22 ENCOUNTER — Ambulatory Visit (INDEPENDENT_AMBULATORY_CARE_PROVIDER_SITE_OTHER): Payer: Medicaid Other | Admitting: Internal Medicine

## 2021-06-22 VITALS — BP 154/98 | HR 80 | Ht 63.0 in | Wt 182.0 lb

## 2021-06-22 DIAGNOSIS — R195 Other fecal abnormalities: Secondary | ICD-10-CM | POA: Diagnosis not present

## 2021-06-22 DIAGNOSIS — Z800 Family history of malignant neoplasm of digestive organs: Secondary | ICD-10-CM

## 2021-06-22 MED ORDER — PROMETHAZINE HCL 12.5 MG PO TABS: 12.5000 mg | ORAL_TABLET | Freq: Four times a day (QID) | ORAL | 0 refills | Status: DC | PRN

## 2021-06-22 MED ORDER — SUTAB 1479-225-188 MG PO TABS: ORAL_TABLET | ORAL | 0 refills | Status: DC

## 2021-06-22 NOTE — Progress Notes (Signed)
? ?Chief Complaint: Positive Cologuard ? ?HPI : 61 year old female with history of severe AS s/p mech AVR, A-flutter, thoracic aortic aneurysm s/p Bentall procedure, HFpEF, COPD, GERD presented with positive Cologuard ? ?Patient had a positive Cologuard in 10-23-2020. Brother died of colon cancer recently, and he was diagnosed at age 8. She has occasionally noticed blood in her stools in the past. The blood is always bright red blood and there only small amounts.  She believes that this bleeding is due to hemorrhoids.  Has not had any rectal bleeding recently.  Denies weight loss or changes in bowel habits. She is on Linzess to help her have a BM since she is on chronic opioids. She uses oxygen at night time due to her COPD. She does have issues with GERD but these are well controlled and her reflux medications. Last colonoscopy 2016 that was normal ? ?Interval History: When she took the Dulcolax tablets in preparation for her colonoscopy, she developed N&V that lasted through the rest of the night. She was not able to even start her Miralax prep due to N&V. She has had issues with N&V when taking certain medications in the past. Phenergan has worked the best in the past for controlling her nausea. She was frustrated that she developed N&V in response to the Dulcolax since she had already started her Lovenox injections after being off of warfarin therapy. She denies blood in stools, changes in bowel habits, and weight loss. ? ?Wt Readings from Last 3 Encounters:  ?06/22/21 182 lb (82.6 kg)  ?06/14/21 183 lb 3.2 oz (83.1 kg)  ?03/17/21 182 lb (82.6 kg)  ? ?Current Outpatient Medications  ?Medication Sig Dispense Refill  ? albuterol (PROVENTIL) (2.5 MG/3ML) 0.083% nebulizer solution Take 3 mLs (2.5 mg total) by nebulization every 6 (six) hours as needed for wheezing or shortness of breath. 75 mL 12  ? albuterol (VENTOLIN HFA) 108 (90 Base) MCG/ACT inhaler Inhale 2 puffs into the lungs every 6 (six) hours as needed for  wheezing or shortness of breath. 8 g 6  ? amoxicillin (AMOXIL) 500 MG capsule TAKE 4 TABLETS BY MOUTH 30-60 MINUTES PRIOR TO DENTAL PROCEDURES 8 capsule 1  ? aspirin EC 81 MG tablet Take 1 tablet (81 mg total) by mouth daily.    ? Budeson-Glycopyrrol-Formoterol (BREZTRI AEROSPHERE) 160-9-4.8 MCG/ACT AERO Inhale 2 puffs into the lungs in the morning and at bedtime. 10.7 g 11  ? cholecalciferol (VITAMIN D3) 25 MCG (1000 UNIT) tablet Take 1,000 Units by mouth daily.    ? enoxaparin (LOVENOX) 80 MG/0.8ML injection Inject 0.8 mLs (80 mg total) into the skin every 12 (twelve) hours. 16 mL 0  ? EPINEPHrine (EPIPEN) 0.3 mg/0.3 mL IJ SOAJ injection Inject 0.3 mg into the muscle as needed for anaphylaxis. For allergic reaction    ? Ferrous Gluconate (IRON 27 PO) Take 27 mg by mouth daily.    ? furosemide (LASIX) 80 MG tablet Take 1 tablet (80 mg total) by mouth 2 (two) times daily. 60 tablet 11  ? ipratropium (ATROVENT) 0.02 % nebulizer solution Take 2.5 mLs (0.5 mg total) by nebulization 4 (four) times daily. 75 mL 12  ? linaclotide (LINZESS) 145 MCG CAPS capsule Take 145 mcg by mouth every morning.    ? losartan (COZAAR) 25 MG tablet TAKE 1 TABLET ONCE DAILY. 30 tablet 6  ? magnesium oxide (MAG-OX) 400 MG tablet Take 1 tablet (400 mg total) by mouth daily. 90 tablet 3  ? metolazone (ZAROXOLYN) 2.5 MG  tablet TAKE 1 TABLET BY MOUTH ON MONDAY, WEDNESDAY AND FRIDAY. 12 tablet 1  ? montelukast (SINGULAIR) 10 MG tablet Take 1 tablet (10 mg total) by mouth at bedtime. 30 tablet 3  ? oxyCODONE-acetaminophen (PERCOCET) 10-325 MG tablet Take 1 tablet by mouth 4 (four) times daily.    ? potassium chloride (KLOR-CON) 10 MEQ tablet TAKE EIGHT TABLETS BY MOUTH TWICE DAILY 480 tablet 6  ? pravastatin (PRAVACHOL) 40 MG tablet TAKE 1 TABLET EVERY EVENING. 30 tablet 0  ? promethazine (PHENERGAN) 25 MG tablet Take 25 mg by mouth every 6 (six) hours as needed for nausea or vomiting.    ? warfarin (COUMADIN) 5 MG tablet TAKE 1 TABLET BY MOUTH  DAILY AS DIRECTED BY THE COUMADIN CLINIC. (Patient taking differently: Take 5 mg by mouth at bedtime. Take 5 mg every day EXCEPT: Thursday take 2.8 mg ? ? AS DIRECTED BY THE COUMADIN CLINIC.) 35 tablet 3  ? omeprazole (PRILOSEC OTC) 20 MG tablet Take 20 mg by mouth daily.    ? ?No current facility-administered medications for this visit.  ? ?Review of Systems: ?All systems reviewed and negative except where noted in HPI.  ? ?Physical Exam: ?BP (!) 154/98   Pulse 80   Ht '5\' 3"'$  (1.6 m)   Wt 182 lb (82.6 kg)   BMI 32.24 kg/m?  ?Constitutional: Pleasant,well-developed, female in no acute distress. ?HEENT: Normocephalic and atraumatic. Conjunctivae are normal. No scleral icterus. ?Cardiovascular: Normal rate, regular rhythm.  ?Pulmonary/chest: Effort normal and breath sounds normal. No wheezing, rales or rhonchi. ?Abdominal: Soft, nondistended, nontender. Bowel sounds active throughout. There are no masses palpable. No hepatomegaly. ?Extremities: No edema ?Neurological: Alert and oriented to person place and time. ?Skin: Skin is warm and dry. No rashes noted. ?Psychiatric: Normal mood and affect. Behavior is normal. ? ?Labs 04/2020: CBC with elevated WBC of 15.5. CMP with mildly low potassium of 3.3 and mildly elevated T bili of 1.3. Lipase mildly elevated at 78 ? ?Labs 09/2020: Positive Cologuard ? ?Labs 01/2021: INR 2.9 ? ?Chest CT w/o contrast 08/10/18: ?IMPRESSION: ?1. There are densely sclerotic reactive endplates and osteophytes of ?T9-T10, corresponding to findings of prior chest radiograph. No ?suspicious mass or nodule. ?2. Severe emphysema and diffuse bilateral bronchial wall thickening. ?3. Coronary artery disease and postoperative findings of aortic ?valve and root graft repair, with probable reconstruction of the ?pulmonary outflow tract. ? ?EGD 02/16/06: ? ? ?Flexible sigmoidoscopy 02/16/06: ? ?Path: ?SIGMOID COLON, BIOPSY: BENIGN COLONIC MUCOSA WITH NO SIGNIFICANT  ? HISTOPATHOLOGIC CHANGES. (SEE COMMENT)   ? COMMENT  ? Features of microscopic colitis are not seen ? ?Colonoscopy 07/2014: ? ?Path: ? ? ?ASSESSMENT AND PLAN: ?Positive Cologuard ?Family history of colon cancer ?Patient developed N&V after taking Dulcolax in preparation for her last colonoscopy. Thus will plan to switch her to a Sutab prep to see if this will help. Will give the patient some antinausea medications to take before starting the preparation. Her colonoscopy was being done for a positive Cologuard test and a strong family history of colon cancer in her brother that was diagnosed in his 26s.  Her last colonoscopy in 2016 was unremarkable.  Since patient is on oxygen at night time at home, will plan for colonoscopy to be done in the hospital.  Her warfarin will be need to be held before her procedure. ?- Colonoscopy [facility]. Will check with her cardiologist Dr. [name] about holding her warfarin before the procedure. Since patient had N&V in response to taking Dulcolax, will  have the patient try a Sutab prep. Will give her Phenergan PRN to use before she starts her prep. ? ?[name], MD ? ?

## 2021-06-22 NOTE — Telephone Encounter (Signed)
[facility] Pre-operative Risk Assessment  ?   ?Request for surgical clearance:     Endoscopy Procedure ? ?What type of surgery is being performed?     Colonoscopy at [facility] ? ?When is this surgery scheduled?     07/22/2021 ? ?What type of clearance is required ?   Pharmacy ? ?Are there any medications that need to be held prior to surgery and how long? Warfarin 5/25 ? ?Practice name and name of physician performing surgery?      [facility] ? ?What is your office phone and fax number?      Phone- [phone]  Fax- [phone] ? ?Anesthesia type (None, local, MAC, general) ?       [facility]  ?

## 2021-06-22 NOTE — Patient Instructions (Addendum)
We have scheduled you for a Colonoscopy at [facility] on 07/22/2021 at 11:15, separate instructions have been given ? ?We have sent in a nausea med called Phenergan for you to use during your colonoscopy prep for nausea  ? ?You will be contacted by our office prior to your procedure for directions on holding your Warfarin .  If you do not hear from our office 1 week prior to your scheduled procedure, please call [phone] to discuss.  ? ?If you are age 5 or older, your body mass index should be between 23-30. Your Body mass index is 32.24 kg/m?Marland Kitchen If this is out of the aforementioned range listed, please consider follow up with your Primary Care Provider. ? ?If you are age 32 or younger, your body mass index should be between 19-25. Your Body mass index is 32.24 kg/m?Marland Kitchen If this is out of the aformentioned range listed, please consider follow up with your Primary Care Provider.  ? ?________________________________________________________ ? ?The [place] [place] [facility] providers would like to encourage you to use [facility] to communicate with providers for non-urgent requests or questions.  Due to long hold times on the telephone, sending your provider a message by [facility] may be a faster and more efficient way to get a response.  Please allow 48 business hours for a response.  Please remember that this is for non-urgent requests.  ?_______________________________________________________  ? ?Due to recent changes in healthcare laws, you may see the results of your imaging and laboratory studies on MyChart before your provider has had a chance to review them.  We understand that in some cases there may be results that are confusing or concerning to you. Not all laboratory results come back in the same time frame and the provider may be waiting for multiple results in order to interpret others.  Please give [place] 48 hours in order for your provider to thoroughly review all the results before contacting the office for  clarification of your results.   ? ?Thank you for entrusting me with your care and for choosing [employer], ?Dr. [name]  ? ?  ?

## 2021-06-23 NOTE — Telephone Encounter (Signed)
FYI Dr Lorenso Courier  ?

## 2021-06-23 NOTE — Telephone Encounter (Signed)
Pt has a coumadin appt on 07/01/21.  Will arrange Lovenox bridge at that time. ?

## 2021-06-23 NOTE — Telephone Encounter (Signed)
Patient with diagnosis of mechanical AVR and afib on warfarin for anticoagulation.   ? ?Procedure: colonoscopy ?Date of procedure: 07/22/21 ? ?CrCl 65m/min using adjusted body weight ?Platelet count 268K ? ?Per office protocol, patient can hold warfarin for 5 days prior to procedure. Patient will need bridging with Lovenox (enoxaparin) around procedure. [facility] - will forward to help coordinate bridge. ? ?

## 2021-07-01 ENCOUNTER — Other Ambulatory Visit: Payer: Self-pay | Admitting: Cardiovascular Disease

## 2021-07-01 DIAGNOSIS — I4892 Unspecified atrial flutter: Secondary | ICD-10-CM

## 2021-07-02 MED ORDER — WARFARIN SODIUM 5 MG PO TABS: 5.0000 mg | ORAL_TABLET | Freq: Every day | ORAL | 2 refills | Status: DC

## 2021-07-02 MED ORDER — WARFARIN SODIUM 5 MG PO TABS: ORAL_TABLET | ORAL | 2 refills | Status: DC

## 2021-07-02 NOTE — Telephone Encounter (Signed)
Prescription refill request received for warfarin ?Lov: 06/14/21 [name])  ?Next INR check: 07/05/21 ?Warfarin tablet strength: '5mg'$  ? ?Appropriate dose and refill sent to requested pharmacy.  ?

## 2021-07-02 NOTE — Addendum Note (Signed)
Addended by: Leonidas Romberg on: 07/02/2021 11:14 AM ? ? Modules accepted: Orders ? ?

## 2021-07-02 NOTE — Addendum Note (Signed)
Addended by: Leonidas Romberg on: 07/02/2021 11:20 AM ? ? Modules accepted: Orders ? ?

## 2021-07-05 ENCOUNTER — Ambulatory Visit (INDEPENDENT_AMBULATORY_CARE_PROVIDER_SITE_OTHER): Payer: Medicaid Other | Admitting: *Deleted

## 2021-07-05 DIAGNOSIS — I4892 Unspecified atrial flutter: Secondary | ICD-10-CM

## 2021-07-05 DIAGNOSIS — Z952 Presence of prosthetic heart valve: Secondary | ICD-10-CM | POA: Diagnosis not present

## 2021-07-05 DIAGNOSIS — Z5181 Encounter for therapeutic drug level monitoring: Secondary | ICD-10-CM

## 2021-07-05 LAB — POCT INR: INR: 2.8 (ref 2.0–3.0)

## 2021-07-05 MED ORDER — ENOXAPARIN SODIUM 80 MG/0.8ML IJ SOSY
80.0000 mg | PREFILLED_SYRINGE | Freq: Two times a day (BID) | INTRAMUSCULAR | 0 refills | Status: DC
Start: 2021-07-05 — End: 2022-11-01

## 2021-07-05 NOTE — Patient Instructions (Addendum)
Pending colonoscopy on 07/22/21: ? ?Labs on 05/14/21:    SCr 0.7   CrCl  109.29    Hgb 13.8    Hct 40.0    Plts 364   Wt 81kg    Has Warfarin '5mg'$  tablet ? ? Inject Lovenox '80mg'$  twice daily at 8am and 8pm ? ?5/19  Last dose of warfarin  (1 tablet) ?5/20  No Lovenox or warfarin ?5/21 - 5/23  Lovenox '80mg'$  sq twice daily at 8am & 8pm ?5/24  Lovenox '80mg'$  sq at 8am--------No Lovenox in pm ?5/25  No Lovenox in am ------------procedure--------warfarin 1 1/2 tablets in pm ?5/26  Lovenox '80mg'$  SQ twice daily (8am & 8pm) and warfarin 1 1/2 tablets in pm. ?5/27 - 5/29  Lovenox '80mg'$  SQ twice daily (8am & 8pm) and warfarin 1 tablet in pm. ?5/30  Lovenox '80mg'$  sq at 8am ------------INR check at 11:00AM ?

## 2021-07-07 ENCOUNTER — Ambulatory Visit: Payer: Medicaid Other | Admitting: Internal Medicine

## 2021-07-15 ENCOUNTER — Encounter (HOSPITAL_COMMUNITY): Payer: Self-pay | Admitting: Internal Medicine

## 2021-07-19 ENCOUNTER — Ambulatory Visit: Payer: Medicaid Other | Admitting: Pulmonary Disease

## 2021-07-19 ENCOUNTER — Encounter: Payer: Self-pay | Admitting: Pulmonary Disease

## 2021-07-19 VITALS — BP 124/80 | HR 73 | Temp 98.2°F | Ht 63.0 in | Wt 183.4 lb

## 2021-07-19 DIAGNOSIS — J432 Centrilobular emphysema: Secondary | ICD-10-CM | POA: Diagnosis not present

## 2021-07-19 DIAGNOSIS — J9611 Chronic respiratory failure with hypoxia: Secondary | ICD-10-CM | POA: Diagnosis not present

## 2021-07-19 MED ORDER — ALBUTEROL SULFATE (2.5 MG/3ML) 0.083% IN NEBU
2.5000 mg | INHALATION_SOLUTION | RESPIRATORY_TRACT | 11 refills | Status: DC | PRN
Start: 2021-07-19 — End: 2022-11-02

## 2021-07-19 NOTE — Progress Notes (Signed)
   Subjective:    Patient ID: Lindsay Bonilla, female    DOB: [birth year], 61 y.o.   MRN: [id number]  HPI  61 yo ex-smoker for FU of COPD and chronic hypoxic respiratory failure on 2 L of oxygen -quit smoking 2017, 1-2 PPD, >50 PYrs -last flare /ED visit 10/2020   PMH - s/p Aortic Valve Replacement/s/p Bentall procedure per Dr. Cyndia Bent on 05/2015 St. Jude Mechanical valved graft , original AVR in 1998 - sinus node dysfunction on Cardizem - HFpEF- on Lasix  -01/2020 covid infection -chronic back pain on percocet  Chief Complaint  Patient presents with   Follow-up    Patient wants to talk about Alpha 1. Otherwise doing good.    She reports dyspnea on exertion, worse in the evenings, wonder if Judithann Sauger is not working as well anymore. She is running out of nebs early and feels like she has to refill it every 1 to 2 weeks.  Review shows that she is getting 75 nebs and a refill. She takes diuretic every other day. She has questions about alpha-1  FH - brother died of COPD< colon cancer Sister had COPD Nephew diagnosed at 2 years old, smoked 3 to 4 packs/day  Significant tests/ events reviewed  PFTs 04/2015 >>ratio 33, FEV1 0.69 /25% , improved to 0.95/35% with BD, FVC 61%, TLC 126 %, DLCO 80   CT chest 07/2018 Coronary artery calcifications. Status post aortic valve and root graft repair, with probable reconstruction of the pulmonary outflow tract . Severe emphysema. Mild, diffuse bilateral bronchial wall thickening.   Echo 04/2019 nml LV fn, RVSP 37   03/2017 NPSG >> no OSA, min desatn 77%  Review of Systems neg for any significant sore throat, dysphagia, itching, sneezing, nasal congestion or excess/ purulent secretions, fever, chills, sweats, unintended wt loss, pleuritic or exertional cp, hempoptysis, orthopnea pnd or change in chronic leg swelling. Also denies presyncope, palpitations, heartburn, abdominal pain, nausea, vomiting, diarrhea or change in bowel or urinary habits,  dysuria,hematuria, rash, arthralgias, visual complaints, headache, numbness weakness or ataxia.     Objective:   Physical Exam   Gen. Pleasant, obese, in no distress ENT - no lesions, no post nasal drip Neck: No JVD, no thyromegaly, no carotid bruits Lungs: no use of accessory muscles, no dullness to percussion, decreased without rales or rhonchi  Cardiovascular: Rhythm regular, heart sounds  normal, no murmurs or gallops, no peripheral edema Musculoskeletal: No deformities, no cyanosis or clubbing , no tremors        Assessment & Plan:

## 2021-07-19 NOTE — Assessment & Plan Note (Signed)
O2 as needed in the daytime. Continue nocturnal oxygen

## 2021-07-19 NOTE — Patient Instructions (Signed)
X LDCT chest to screen for lung cancer  X Alpha 1 blood work

## 2021-07-19 NOTE — Assessment & Plan Note (Signed)
Continue Breztri twice daily. We will provide her with increased quantity of albuterol and Atrovent nebs. We discussed COPD action plan  I again discussed low-dose CT scan screening for lung cancer, she is willing to proceed.  We also discussed alpha-1 testing in detail she does have family members with COPD including a nephew that was 60 years old

## 2021-07-22 ENCOUNTER — Ambulatory Visit (HOSPITAL_BASED_OUTPATIENT_CLINIC_OR_DEPARTMENT_OTHER): Payer: Medicaid Other | Admitting: Anesthesiology

## 2021-07-22 ENCOUNTER — Encounter (HOSPITAL_COMMUNITY): Payer: Self-pay | Admitting: Internal Medicine

## 2021-07-22 ENCOUNTER — Ambulatory Visit (HOSPITAL_COMMUNITY): Payer: Medicaid Other | Admitting: Anesthesiology

## 2021-07-22 ENCOUNTER — Encounter (HOSPITAL_COMMUNITY)
Admission: RE | Disposition: A | Discharge status: Home / Self Care | Payer: Self-pay | Source: Home / Self Care | Attending: Internal Medicine

## 2021-07-22 ENCOUNTER — Ambulatory Visit (HOSPITAL_COMMUNITY)
Admission: RE | Admit: 2021-07-22 | Discharge: 2021-07-22 | Disposition: A | Discharge status: Home / Self Care | Payer: Medicaid Other | Attending: Internal Medicine | Admitting: Internal Medicine

## 2021-07-22 ENCOUNTER — Other Ambulatory Visit: Payer: Self-pay

## 2021-07-22 DIAGNOSIS — K635 Polyp of colon: Secondary | ICD-10-CM

## 2021-07-22 DIAGNOSIS — Z952 Presence of prosthetic heart valve: Secondary | ICD-10-CM | POA: Diagnosis not present

## 2021-07-22 DIAGNOSIS — J449 Chronic obstructive pulmonary disease, unspecified: Secondary | ICD-10-CM | POA: Diagnosis not present

## 2021-07-22 DIAGNOSIS — F419 Anxiety disorder, unspecified: Secondary | ICD-10-CM | POA: Insufficient documentation

## 2021-07-22 DIAGNOSIS — K648 Other hemorrhoids: Secondary | ICD-10-CM | POA: Insufficient documentation

## 2021-07-22 DIAGNOSIS — Z1211 Encounter for screening for malignant neoplasm of colon: Secondary | ICD-10-CM

## 2021-07-22 DIAGNOSIS — D125 Benign neoplasm of sigmoid colon: Secondary | ICD-10-CM | POA: Insufficient documentation

## 2021-07-22 DIAGNOSIS — Z87891 Personal history of nicotine dependence: Secondary | ICD-10-CM | POA: Insufficient documentation

## 2021-07-22 DIAGNOSIS — D122 Benign neoplasm of ascending colon: Secondary | ICD-10-CM | POA: Insufficient documentation

## 2021-07-22 DIAGNOSIS — Z800 Family history of malignant neoplasm of digestive organs: Secondary | ICD-10-CM

## 2021-07-22 DIAGNOSIS — F32A Depression, unspecified: Secondary | ICD-10-CM | POA: Insufficient documentation

## 2021-07-22 DIAGNOSIS — R195 Other fecal abnormalities: Secondary | ICD-10-CM

## 2021-07-22 HISTORY — PX: POLYPECTOMY: SHX5525

## 2021-07-22 HISTORY — PX: COLONOSCOPY WITH PROPOFOL: SHX5780

## 2021-07-22 HISTORY — PX: HEMOSTASIS CLIP PLACEMENT: SHX6857

## 2021-07-22 SURGERY — COLONOSCOPY WITH PROPOFOL
Anesthesia: Monitor Anesthesia Care

## 2021-07-22 MED ORDER — PROPOFOL 500 MG/50ML IV EMUL
INTRAVENOUS | Status: DC | PRN
  Administered 2021-07-22: 150 ug/kg/min via INTRAVENOUS

## 2021-07-22 MED ORDER — SODIUM CHLORIDE 0.9 % IV SOLN: INTRAVENOUS | Status: DC

## 2021-07-22 MED ORDER — PROPOFOL 10 MG/ML IV BOLUS
INTRAVENOUS | Status: DC | PRN
  Administered 2021-07-22: 20 mg via INTRAVENOUS
  Administered 2021-07-22 (×2): 40 mg via INTRAVENOUS

## 2021-07-22 MED ORDER — LACTATED RINGERS IV SOLN
INTRAVENOUS | Status: DC
Start: 2021-07-22 — End: 2021-07-22

## 2021-07-22 SURGICAL SUPPLY — 22 items

## 2021-07-22 NOTE — Anesthesia Postprocedure Evaluation (Signed)
Anesthesia Post Note  Patient: LADONNA VANORDER  Procedure(s) Performed: COLONOSCOPY WITH PROPOFOL POLYPECTOMY HEMOSTASIS CLIP PLACEMENT     Patient location during evaluation: PACU Anesthesia Type: MAC Level of consciousness: awake and alert Pain management: pain level controlled Vital Signs Assessment: post-procedure vital signs reviewed and stable Respiratory status: spontaneous breathing, nonlabored ventilation, respiratory function stable and patient connected to nasal cannula oxygen Cardiovascular status: stable and blood pressure returned to baseline Postop Assessment: no apparent nausea or vomiting Anesthetic complications: no   No notable events documented.  Last Vitals:  Vitals:   07/22/21 1220 07/22/21 1230  BP: 134/74 (!) 141/91  Pulse: (!) 59 67  Resp: 16 14  Temp:    SpO2: 98% 98%    Last Pain:  Vitals:   07/22/21 1230  TempSrc:   PainSc: 0-No pain                 Charli Halle S

## 2021-07-22 NOTE — Anesthesia Preprocedure Evaluation (Signed)
Anesthesia Evaluation  Patient identified by MRN, date of birth, ID band Patient awake    Reviewed: Allergy & Precautions, NPO status , Patient's Chart, lab work & pertinent test results  History of Anesthesia Complications (+) PONV and history of anesthetic complications  Airway Mallampati: II  TM Distance: >3 FB Neck ROM: Full    Dental no notable dental hx.    Pulmonary asthma , COPD,  COPD inhaler, former smoker,    Pulmonary exam normal breath sounds clear to auscultation       Cardiovascular  Rhythm:Regular Rate:Normal + Systolic murmurs  Aortic insufficiency  a. s/p Bentall with mechanical AVR 4/17 >> FU Echo 6/17: Mild LVH, EF 60-65%, normal wall motion, ventricular septum with paradoxical septal motion, St. Jude mechanical AVR functioning  normally with no regurgitation (mean 6 mmHg), MAC, trivial MR, mild RVE, trivial TR    Neuro/Psych Anxiety Depression negative neurological ROS     GI/Hepatic negative GI ROS, Neg liver ROS,   Endo/Other  negative endocrine ROS  Renal/GU negative Renal ROS  negative genitourinary   Musculoskeletal negative musculoskeletal ROS (+)   Abdominal   Peds negative pediatric ROS (+)  Hematology negative hematology ROS (+)   Anesthesia Other Findings   Reproductive/Obstetrics negative OB ROS                             Anesthesia Physical Anesthesia Plan  ASA: 3  Anesthesia Plan: MAC   Post-op Pain Management:    Induction: Intravenous  PONV Risk Score and Plan: 3 and Propofol infusion and Treatment may vary due to age or medical condition  Airway Management Planned: Simple Face Mask  Additional Equipment:   Intra-op Plan:   Post-operative Plan:   Informed Consent: I have reviewed the patients History and Physical, chart, labs and discussed the procedure including the risks, benefits and alternatives for the proposed anesthesia with the  patient or authorized representative who has indicated his/her understanding and acceptance.     Dental advisory given  Plan Discussed with: CRNA and Surgeon  Anesthesia Plan Comments:         Anesthesia Quick Evaluation

## 2021-07-22 NOTE — H&P (Addendum)
GASTROENTEROLOGY PROCEDURE H&P NOTE   Primary Care Physician: Leeanne Rio, MD    Reason for Procedure:   Positive Cologuard, family history of colon cancer  Plan:    Colonoscopy  Patient is appropriate for endoscopic procedure(s) in the hospital setting.  The nature of the procedure, as well as the risks, benefits, and alternatives were carefully and thoroughly reviewed with the patient. Ample time for discussion and questions allowed. The patient understood, was satisfied, and agreed to proceed.     HPI: Lindsay Bonilla is a 61 y.o. female who presents for colonoscopy for evaluation of positive Cologuard and family history of colon cancer .  Patient was most recently seen in the Gastroenterology Clinic on 06/22/21.  No interval change in medical history since that appointment. Please refer to that note for full details regarding GI history and clinical presentation.   Past Medical History:  Diagnosis Date   Anxiety    Aortic aneurysm (HCC)    Aortic insufficiency    a. s/p Bentall with mechanical AVR 4/17 >> FU Echo 6/17: Mild LVH, EF 60-65%, normal wall motion, ventricular septum with paradoxical septal motion, St. Jude mechanical AVR functioning  normally with no regurgitation (mean 6 mmHg), MAC, trivial MR, mild RVE, trivial TR   Aortic stenosis, severe    Bicuspid valve   Arthritis    Asthma    Atrial flutter (HCC)    Bitten or stung by nonvenomous insect and other nonvenomous arthropods, initial encounter    Bronchitis    Chronic diastolic CHF (congestive heart failure) (HCC)    Chronic respiratory failure with hypoxia (HCC)    COPD (chronic obstructive pulmonary disease) (HCC)    DDD (degenerative disc disease), cervical    Depression    Endocarditis, valve unspecified    GERD (gastroesophageal reflux disease)    Hard of hearing    Heart failure (HCC)    Heart failure, unspecified (HCC)    Hemorrhage, not elsewhere classified    History of pneumonia     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   Hyperkalemia    Low back pain    Migraine headache    Mild CAD    Other chronic pain    Palpitations    PAT (paroxysmal atrial tachycardia) (HCC)    PONV (postoperative nausea and vomiting)    "only once"   Premature atrial contractions    PVC's (premature ventricular contractions)    Shortness of breath    Nodule left lung CT done 8/6   Sinus bradycardia    Stress incontinence    Unspecified osteoarthritis, unspecified site    Valvular heart disease     Past Surgical History:  Procedure Laterality Date   ABDOMINAL HYSTERECTOMY     ANTERIOR CERVICAL DECOMP/DISCECTOMY FUSION  10/05/2011   Procedure: ANTERIOR CERVICAL DECOMPRESSION/DISCECTOMY FUSION 2 LEVELS;  Surgeon: Floyce Stakes, MD;  Location: [facility];  Service: Neurosurgery;  Laterality: N/A;  Cervical five - six , six - seven Anterior cervical decompression/diskectomy/fusion/plate   ANTERIOR CRUCIATE LIGAMENT REPAIR Right    AORTIC VALVE REPLACEMENT     ARTHROSCOPIC REPAIR ACL  rt   BENTALL PROCEDURE N/A 06/15/2015   Procedure: BENTALL PROCEDURE; HEMI-ARCH REPAIR WITH #23 ST JUDE MECHANICAL AVR CONDUIT AND #28 HEMASHIELD PLATINUM GRAFT;  Surgeon: Gaye Pollack, MD;  Location: [facility];  Service: Open Heart Surgery;  Laterality: N/A;   CARDIAC CATHETERIZATION N/A 05/21/2015   Procedure: Right/Left Heart Cath and Coronary  Angiography;  Surgeon: [name], MD;  Location: [facility];  Service: Cardiovascular;  Laterality: N/A;   CHOLECYSTECTOMY     COLONOSCOPY  10/13/05   DIAGNOSTIC LAPAROSCOPY     JOINT REPLACEMENT Right 2016   KNEE ARTHROSCOPY     rt x4  x1 lft   MYRINGOPLASTY W/ FAT GRAFT     graft from behind ear   SIGMOIDOSCOPY  10/13/05, 09/19/05   TEE WITHOUT CARDIOVERSION N/A 06/15/2015   Procedure: TRANSESOPHAGEAL ECHOCARDIOGRAM (TEE);  Surgeon: Gaye Pollack, MD;  Location: [facility];  Service: Open Heart Surgery;  Laterality: N/A;    TONSILLECTOMY  2005   TUBAL LIGATION     TYMPANOSTOMY TUBE PLACEMENT      Prior to Admission medications   Medication Sig Start Date End Date Taking? Authorizing Provider  albuterol (PROVENTIL) (2.5 MG/3ML) 0.083% nebulizer solution Take 3 mLs (2.5 mg total) by nebulization every 4 (four) hours as needed for wheezing or shortness of breath. 07/19/21  Yes Rigoberto Noel, MD  aspirin EC 81 MG tablet Take 1 tablet (81 mg total) by mouth daily. 07/23/15  Yes Weaver, Scott T, PA-C  Budeson-Glycopyrrol-Formoterol (BREZTRI AEROSPHERE) 160-9-4.8 MCG/ACT AERO Inhale 2 puffs into the lungs in the morning and at bedtime. 03/17/21  Yes Rigoberto Noel, MD  chlorpheniramine (CHLOR-TRIMETON) 4 MG tablet Take 4 mg by mouth See admin instructions. Take 4 mg in the morning and 8 mg at bedtime   Yes [provider]  Cholecalciferol (VITAMIN D) 125 MCG (5000 UT) CAPS Take 5,000 Units by mouth daily.   Yes [provider]  enoxaparin (LOVENOX) 80 MG/0.8ML injection Inject 0.8 mLs (80 mg total) into the skin every 12 (twelve) hours. 07/05/21  Yes [name], MD  furosemide (LASIX) 80 MG tablet Take 1 tablet (80 mg total) by mouth 2 (two) times daily. Patient taking differently: Take 80 mg by mouth daily as needed for edema. 12/05/19  Yes [name], MD  gabapentin (NEURONTIN) 600 MG tablet Take 600 mg by mouth 2 (two) times daily. 07/01/21  Yes [provider]  ipratropium (ATROVENT) 0.02 % nebulizer solution Take 2.5 mLs (0.5 mg total) by nebulization 4 (four) times daily. Patient taking differently: Take 0.5 mg by nebulization every 4 (four) hours as needed for wheezing or shortness of breath. 05/21/21  Yes Rigoberto Noel, MD  linaclotide (LINZESS) 145 MCG CAPS capsule Take 145 mcg by mouth every morning. 09/08/20  Yes [provider]  losartan (COZAAR) 25 MG tablet TAKE 1 TABLET ONCE DAILY. 07/02/21  Yes [name], MD  magnesium oxide (MAG-OX) 400 MG  tablet Take 1 tablet (400 mg total) by mouth daily. 12/31/18  Yes Dunn, Dayna N, PA-C  metolazone (ZAROXOLYN) 2.5 MG tablet TAKE 1 TABLET BY MOUTH ON MONDAY, WEDNESDAY AND FRIDAY. 07/01/21  Yes [name], MD  montelukast (SINGULAIR) 10 MG tablet Take 1 tablet (10 mg total) by mouth at bedtime. 02/17/20  Yes Rigoberto Noel, MD  omeprazole (PRILOSEC OTC) 20 MG tablet Take 20 mg by mouth daily. 11/28/19 07/19/21 Yes [provider]  oxyCODONE-acetaminophen (PERCOCET) 10-325 MG tablet Take 1 tablet by mouth 4 (four) times daily as needed for pain.   Yes [provider]  potassium chloride (KLOR-CON) 10 MEQ tablet TAKE EIGHT TABLETS BY MOUTH TWICE DAILY Patient taking differently: 80 mEq daily as needed (with lasix). 12/05/19  Yes [name], MD  pravastatin (PRAVACHOL) 40 MG tablet TAKE 1 TABLET  EVERY EVENING. 12/15/20  Yes [name], MD  Sodium Sulfate-Mag Sulfate-KCl (SUTAB) [phone] MG TABS Take as directed per colonoscopy prep 06/22/21  Yes Sharyn Creamer, MD  traZODone (DESYREL) 100 MG tablet Take 100 mg by mouth at bedtime.   Yes [provider]  warfarin (COUMADIN) 5 MG tablet Take 2.70m to 587mdaily, as directed by coumadin clinic. Patient taking differently: Take 2.5-5 mg by mouth See admin instructions. Take 2.5 mg on Thursday, All the other days take 5 mg as directed by coumadin clinic. 07/02/21  Yes McBurnell BlanksMD  albuterol (VENTOLIN HFA) 108 (90 Base) MCG/ACT inhaler Inhale 2 puffs into the lungs every 6 (six) hours as needed for wheezing or shortness of breath. Patient taking differently: Inhale 1 puff into the lungs every 4 (four) hours as needed for wheezing or shortness of breath. 12/17/20   AlRigoberto NoelMD  amoxicillin (AMOXIL) 500 MG capsule TAKE 4 TABLETS BY MOUTH 30-60 MINUTES PRIOR TO DENTAL PROCEDURES 12/31/18   Dunn, DaNedra HaiPA-C  EPINEPHrine (EPIPEN) 0.3 mg/0.3 mL IJ SOAJ injection Inject 0.3 mg into  the muscle as needed for anaphylaxis. For allergic reaction    [provider]  promethazine (PHENERGAN) 12.5 MG tablet Take 1 tablet (12.5 mg total) by mouth every 6 (six) hours as needed for nausea or vomiting. Take one tablet every 6 hours as needed with colonoscopy prep for nausea 06/22/21   DoSharyn CreamerMD    Current Facility-Administered Medications  Medication Dose Route Frequency Provider Last Rate Last Admin   0.9 %  sodium chloride infusion   Intravenous Continuous DoSharyn CreamerMD       lactated ringers infusion   Intravenous Continuous DoSharyn CreamerMD 20 mL/hr at 07/22/21 1005 New Bag at 07/22/21 1005    Allergies as of 06/22/2021 - Review Complete 06/22/2021  Allergen Reaction Noted   Alpha-gal  05/08/2019   Beta adrenergic blockers Anaphylaxis 08/21/2008   Doxycycline Other (See Comments) 03/28/2016   Peanut-containing drug products Anaphylaxis, Shortness Of Breath, Swelling, and Other (See Comments) 08/21/2008   Vancomycin Swelling 06/20/2015   Other  09/30/2017   Avelox [moxifloxacin hcl in nacl] Rash    Ciprofloxacin Nausea And Vomiting 08/21/2008   Cleocin [clindamycin hcl] Rash 09/26/2011   Moxifloxacin Rash 08/21/2008   Sulfamethoxazole-trimethoprim Rash 08/21/2008   Tape Other (See Comments) 09/30/2011    Family History  Problem Relation Age of Onset   Cancer Mother        Type unknown-? lung   Heart attack Father    Asthma Brother    Colon cancer Brother        dx at age 791 Esophageal cancer Maternal Grandfather        smoker   Diabetes Other        family history   Hyperlipidemia Other        family history    Social History   Socioeconomic History   Marital status: Divorced    Spouse name: Not on file   Number of children: 3   Years of education: Not on file   Highest education level: Not on file  Occupational History   Occupation: Disabled  Tobacco Use   Smoking status: Former    Packs/day: 0.10    Years: 30.00    Pack  years: 3.00    Types: Cigarettes    Quit date: 04/29/2015    Years since quitting: 6.2   Smokeless tobacco:  Never  Vaping Use   Vaping Use: Never used  Substance and Sexual Activity   Alcohol use: No    Alcohol/week: 0.0 standard drinks   Drug use: No   Sexual activity: Not Currently  Other Topics Concern   Not on file  Social History Narrative   Not on file   Social Determinants of Health   Financial Resource Strain: Not on file  Food Insecurity: Not on file  Transportation Needs: Not on file  Physical Activity: Not on file  Stress: Not on file  Social Connections: Not on file  Intimate Partner Violence: Not on file    Physical Exam: Vital signs in last 24 hours: BP (!) 155/93   Pulse 72   Temp 97.7 F (36.5 C) (Temporal)   Resp 15   SpO2 100%  GEN: NAD EYE: Sclerae anicteric ENT: MMM CV: Non-tachycardic Pulm: No increased WOB GI: Soft NEURO:  Alert & Oriented   [name], MD [facility]   07/22/2021 10:08 AM

## 2021-07-22 NOTE — Transfer of Care (Signed)
Immediate Anesthesia Transfer of Care Note  Patient: GRAELYN BIHL  Procedure(s) Performed: COLONOSCOPY WITH PROPOFOL POLYPECTOMY HEMOSTASIS CLIP PLACEMENT  Patient Location: Endoscopy Unit  Anesthesia Type:MAC  Level of Consciousness: drowsy and patient cooperative  Airway & Oxygen Therapy: Patient Spontanous Breathing and Patient connected to face mask oxygen  Post-op Assessment: Report given to RN and Post -op Vital signs reviewed and stable  Post vital signs: Reviewed and stable  Last Vitals:  Vitals Value Taken Time  BP 142/78 07/22/21 1204  Temp    Pulse 64 07/22/21 1205  Resp 17 07/22/21 1205  SpO2 100 % 07/22/21 1205  Vitals shown include unvalidated device data.  Last Pain:  Vitals:   07/22/21 1204  TempSrc:   PainSc: Asleep         Complications: No notable events documented.

## 2021-07-22 NOTE — Op Note (Signed)
[facility] Patient Name: Lindsay Bonilla Procedure Date: 07/22/2021 MRN: [id number] Attending MD: Georgian Co ,  Date of Birth: Jan 09, 1961 CSN: [id number] Age: 61 Admit Type: Outpatient Procedure:                Colonoscopy Indications:              Screening in patient at increased risk: Family                            history of 1st-degree relative with colorectal                            cancer before age 84 years, Incidental - Positive                            fecal immunochemical test Providers:                Adline Mango" Burnice Logan, Technician, Margurite Auerbach Referring MD:             Catalina Antigua, MD Medicines:                Monitored Anesthesia Care Complications:            No immediate complications. Estimated Blood Loss:     Estimated blood loss was minimal. Procedure:                Pre-Anesthesia Assessment:                           - Prior to the procedure, a History and Physical                            was performed, and patient medications and                            allergies were reviewed. The patient's tolerance of                            previous anesthesia was also reviewed. The risks                            and benefits of the procedure and the sedation                            options and risks were discussed with the patient.                            All questions were answered, and informed consent                            was obtained. Prior Anticoagulants: The patient has  taken Lovenox (enoxaparin), last dose was 1 day                            prior to procedure. ASA Grade Assessment: III - A                            patient with severe systemic disease. After                            reviewing the risks and benefits, the patient was                            deemed in satisfactory condition to undergo the                             procedure.                           After obtaining informed consent, the colonoscope                            was passed under direct vision. Throughout the                            procedure, the patient's blood pressure, pulse, and                            oxygen saturations were monitored continuously. The                            CF-HQ190L ([id number]) Olympus colonoscope was                            introduced through the anus and advanced to the the                            terminal ileum. The colonoscopy was performed                            without difficulty. The patient tolerated the                            procedure well. The quality of the bowel                            preparation was good. The terminal ileum, ileocecal                            valve, appendiceal orifice, and rectum were                            photographed. Scope In: 11:27:24 AM Scope Out: 22:02:54 AM Scope Withdrawal Time: 0 hours 23 minutes 5 seconds  Total Procedure Duration: 0 hours 28 minutes 53 seconds  Findings:  The terminal ileum appeared normal.      Three sessile polyps were found in the ascending colon. The polyps were       3 to 8 mm in size. These polyps were removed with a cold snare.       Resection and retrieval were complete.      A 3 mm polyp was found in the sigmoid colon. The polyp was sessile. The       polyp was removed with a cold snare. Resection and retrieval were       complete.      A 10 mm polyp was found in the sigmoid colon. The polyp was sessile. The       polyp was removed with a cold snare. Resection and retrieval were       complete. To prevent bleeding after the polypectomy, one hemostatic clip       was successfully placed. There was no bleeding at the end of the       procedure.      Non-bleeding internal hemorrhoids were found during retroflexion. Impression:               - The examined portion of the ileum was normal.                            - Three 3 to 8 mm polyps in the ascending colon,                            removed with a cold snare. Resected and retrieved.                           - One 3 mm polyp in the sigmoid colon, removed with                            a cold snare. Resected and retrieved.                           - One 10 mm polyp in the sigmoid colon, removed                            with a cold snare. Resected and retrieved. Clip was                            placed.                           - Non-bleeding internal hemorrhoids. Moderate Sedation:      Not Applicable - Patient had care per Anesthesia. Recommendation:           - Discharge patient to home (with escort).                           - Await pathology results.                           - Okay to restart your warfarin today.                           -  The findings and recommendations were discussed                            with the patient. Procedure Code(s):        --- Professional ---                           [phone], Colonoscopy, flexible; with removal of                            tumor(s), polyp(s), or other lesion(s) by snare                            technique Diagnosis Code(s):        --- Professional ---                           K63.5, Polyp of colon                           Z80.0, Family history of malignant neoplasm of                            digestive organs                           K64.8, Other hemorrhoids CPT copyright 2019 [employer]. All rights reserved. The codes documented in this report are preliminary and upon coder review may  be revised to meet current compliance requirements. [name] "[name],  07/22/2021 12:13:52 PM Number of Addenda: 0

## 2021-07-22 NOTE — Discharge Instructions (Signed)
YOU HAD AN ENDOSCOPIC PROCEDURE TODAY: Refer to the procedure report and other information in the discharge instructions given to you for any specific questions about what was found during the examination. If this information does not answer your questions, please call [facility] office at [phone] to clarify.  ° °YOU SHOULD EXPECT: Some feelings of bloating in the abdomen. Passage of more gas than usual. Walking can help get rid of the air that was put into your GI tract during the procedure and reduce the bloating. If you had a lower endoscopy (such as a colonoscopy or flexible sigmoidoscopy) you may notice spotting of blood in your stool or on the toilet paper. Some abdominal soreness may be present for a day or two, also. ° °DIET: Your first meal following the procedure should be a light meal and then it is ok to progress to your normal diet. A half-sandwich or bowl of soup is an example of a good first meal. Heavy or fried foods are harder to digest and may make you feel nauseous or bloated. Drink plenty of fluids but you should avoid alcoholic beverages for 24 hours. If you had a esophageal dilation, please see attached instructions for diet.   ° °ACTIVITY: Your care partner should take you home directly after the procedure. You should plan to take it easy, moving slowly for the rest of the day. You can resume normal activity the day after the procedure however YOU SHOULD NOT DRIVE, use power tools, machinery or perform tasks that involve climbing or major physical exertion for 24 hours (because of the sedation medicines used during the test).  ° °SYMPTOMS TO REPORT IMMEDIATELY: °A gastroenterologist can be reached at any hour. Please call [phone]  for any of the following symptoms:  °Following lower endoscopy (colonoscopy, flexible sigmoidoscopy) °Excessive amounts of blood in the stool  °Significant tenderness, worsening of abdominal pains  °Swelling of the abdomen that is new, acute  °Fever of 100° or  higher  °Following upper endoscopy (EGD, EUS, ERCP, esophageal dilation) °Vomiting of blood or coffee ground material  °New, significant abdominal pain  °New, significant chest pain or pain under the shoulder blades  °Painful or persistently difficult swallowing  °New shortness of breath  °Black, tarry-looking or red, bloody stools ° °FOLLOW UP:  °If any biopsies were taken you will be contacted by phone or by letter within the next 1-3 weeks. Call [phone]  if you have not heard about the biopsies in 3 weeks.  °Please also call with any specific questions about appointments or follow up tests. ° °

## 2021-07-23 ENCOUNTER — Encounter: Payer: Self-pay | Admitting: Internal Medicine

## 2021-07-23 LAB — SURGICAL PATHOLOGY

## 2021-07-23 LAB — ALPHA-1-ANTITRYPSIN PHENOTYP: A-1 Antitrypsin: 137 mg/dL (ref 101–187)

## 2021-07-23 NOTE — Progress Notes (Signed)
Recall placed per Dr. Libby Maw request. Letter sent via My Chart and mailed to home address.

## 2021-07-27 ENCOUNTER — Ambulatory Visit: Payer: Medicaid Other | Admitting: *Deleted

## 2021-07-27 DIAGNOSIS — Z5181 Encounter for therapeutic drug level monitoring: Secondary | ICD-10-CM | POA: Diagnosis not present

## 2021-07-27 DIAGNOSIS — I4892 Unspecified atrial flutter: Secondary | ICD-10-CM

## 2021-07-27 LAB — POCT INR: INR: 1.8 — AB (ref 2.0–3.0)

## 2021-07-27 NOTE — Patient Instructions (Signed)
Code [phone] Take warfarin 2 tablets tonight the resume 1 tablet daily except for 1/2 a tablet on Thursdays Take lovenox tonight and tomorrow night then stop Call [phone] for questions or concerns.

## 2021-08-10 ENCOUNTER — Ambulatory Visit: Payer: Medicaid Other | Admitting: *Deleted

## 2021-08-10 DIAGNOSIS — Z5181 Encounter for therapeutic drug level monitoring: Secondary | ICD-10-CM | POA: Diagnosis not present

## 2021-08-10 DIAGNOSIS — I4892 Unspecified atrial flutter: Secondary | ICD-10-CM | POA: Diagnosis not present

## 2021-08-10 LAB — POCT INR: INR: 2.2 (ref 2.0–3.0)

## 2021-08-10 NOTE — Patient Instructions (Signed)
Continue warfarin 1 tablet daily except for 1/2 a tablet on Thursdays Recheck in 4 wks Call [phone] for questions or concerns.

## 2021-09-01 ENCOUNTER — Other Ambulatory Visit: Payer: Self-pay | Admitting: Cardiovascular Disease

## 2021-09-01 DIAGNOSIS — E782 Mixed hyperlipidemia: Secondary | ICD-10-CM

## 2021-09-07 ENCOUNTER — Ambulatory Visit: Payer: Medicaid Other | Admitting: *Deleted

## 2021-09-07 DIAGNOSIS — Z952 Presence of prosthetic heart valve: Secondary | ICD-10-CM | POA: Diagnosis not present

## 2021-09-07 DIAGNOSIS — I4892 Unspecified atrial flutter: Secondary | ICD-10-CM | POA: Diagnosis not present

## 2021-09-07 DIAGNOSIS — Z5181 Encounter for therapeutic drug level monitoring: Secondary | ICD-10-CM

## 2021-09-07 LAB — POCT INR: INR: 2.5 (ref 2.0–3.0)

## 2021-09-07 NOTE — Patient Instructions (Signed)
Continue warfarin 1 tablet daily except for 1/2 a tablet on Thursdays Recheck in 5 wks Call [phone] for questions or concerns.

## 2021-09-21 ENCOUNTER — Other Ambulatory Visit: Payer: Self-pay

## 2021-09-21 DIAGNOSIS — Z122 Encounter for screening for malignant neoplasm of respiratory organs: Secondary | ICD-10-CM

## 2021-09-21 DIAGNOSIS — Z87891 Personal history of nicotine dependence: Secondary | ICD-10-CM

## 2021-09-24 ENCOUNTER — Other Ambulatory Visit: Payer: Self-pay

## 2021-09-24 DIAGNOSIS — E782 Mixed hyperlipidemia: Secondary | ICD-10-CM

## 2021-10-01 ENCOUNTER — Other Ambulatory Visit: Payer: Self-pay | Admitting: Pulmonary Disease

## 2021-10-12 ENCOUNTER — Ambulatory Visit: Payer: Medicaid Other | Admitting: *Deleted

## 2021-10-12 DIAGNOSIS — I4892 Unspecified atrial flutter: Secondary | ICD-10-CM | POA: Diagnosis not present

## 2021-10-12 DIAGNOSIS — Z5181 Encounter for therapeutic drug level monitoring: Secondary | ICD-10-CM

## 2021-10-12 DIAGNOSIS — Z952 Presence of prosthetic heart valve: Secondary | ICD-10-CM | POA: Diagnosis not present

## 2021-10-12 LAB — POCT INR: INR: 2.2 (ref 2.0–3.0)

## 2021-10-12 NOTE — Patient Instructions (Signed)
Continue warfarin 1 tablet daily except for 1/2 a tablet on Thursdays Recheck in 6 wks Call [phone] for questions or concerns.  

## 2021-10-20 ENCOUNTER — Encounter: Payer: Medicaid Other | Admitting: Acute Care

## 2021-10-25 ENCOUNTER — Ambulatory Visit (HOSPITAL_COMMUNITY): Payer: Medicaid Other

## 2021-10-30 ENCOUNTER — Other Ambulatory Visit: Payer: Self-pay | Admitting: Cardiovascular Disease

## 2021-10-30 DIAGNOSIS — I4892 Unspecified atrial flutter: Secondary | ICD-10-CM

## 2021-11-04 ENCOUNTER — Encounter: Payer: Self-pay | Admitting: Pulmonary Disease

## 2021-11-04 MED ORDER — PREDNISONE 10 MG PO TABS: ORAL_TABLET | ORAL | 0 refills | Status: DC

## 2021-11-04 NOTE — Telephone Encounter (Signed)
Primary Pulmonologist: Dr. Elsworth Soho  Last office visit and with whom: Dr. Elsworth Soho 07/19/2021  What do we see them for (pulmonary problems): emphysema  Last OV assessment/plan: see below   Was appointment offered to patient (explain)?  No availability in [place]    Reason for call: Patient sent a mychart message stating that she had covid a few weeks ago. Has had a persistent cough with white thick sputum for about a week. She has wheezing increased SOB. She has been doing her breathing treatments but they are not helping much.   Dr. Elsworth Soho is out of office so sending to DOD. Please advise     Allergies  Allergen Reactions   Alpha-Gal     Red meat   Beta Adrenergic Blockers Anaphylaxis   Doxycycline Other (See Comments)    "Swelling, dizziness, sleepy, talking out of my head, almost caused liver failure"   Peanut-Containing Drug Products Anaphylaxis, Shortness Of Breath, Swelling and Other (See Comments)    Swelling of the throat   Vancomycin Swelling    Rash and severe swelling   Avelox [Moxifloxacin Hcl In Nacl] Rash   Ciprofloxacin Nausea And Vomiting   Cleocin [Clindamycin Hcl] Rash   Moxifloxacin Rash   Sulfamethoxazole-Trimethoprim Rash   Tape Other (See Comments)    White clear itches, rash ONLY use paper tape    Immunization History  Administered Date(s) Administered   Hepatitis B 03/04/2002   Hepatitis B, adult 03/04/2002, 12/23/2015, 12/15/2016   Influenza Inj Mdck Quad Pf 11/28/2019   Influenza Split 12/15/2003, 01/25/2005   Influenza Whole 12/29/2020   Influenza,inj,quad, With Preservative 12/23/2015, 12/15/2016   Influenza-Unspecified 11/11/2010, 01/31/2012, 12/12/2012, 11/07/2013, 12/17/2014, 12/14/2015, 12/15/2016, 12/07/2017, 11/26/2018, 11/27/2019   Moderna Sars-Covid-2 Vaccination 05/16/2019, 06/14/2019, 01/01/2020, 06/24/2020   Pneumococcal Conjugate-13 11/26/2018, 11/28/2019   Pneumococcal Polysaccharide-23 01/25/2005, 11/11/2010, 10/06/2011, 12/15/2016,  12/07/2017   Pneumococcal-Unspecified 01/25/2005, 12/15/2016   Td 12/23/2015   Tdap 08/08/2010, 12/23/2015, 12/30/2018    Assessment & Plan:           Assessment & Plan Note by Rigoberto Noel, MD at 07/19/2021 9:36 AM  Author: Rigoberto Noel, MD Author Type: Physician Filed: 07/19/2021  9:37 AM  Note Status: Written Cosign: Cosign Not Required Encounter Date: 07/19/2021  Problem: COPD (chronic obstructive pulmonary disease)/Emphysema  Editor: Rigoberto Noel, MD (Physician)             Continue Judithann Sauger twice daily. We will provide her with increased quantity of albuterol and Atrovent nebs. We discussed COPD action plan   I again discussed low-dose CT scan screening for lung cancer, she is willing to proceed.   We also discussed alpha-1 testing in detail she does have family members with COPD including a nephew that was 93 years old        Assessment & Plan Note by Rigoberto Noel, MD at 07/19/2021 9:36 AM  Author: Rigoberto Noel, MD Author Type: Physician Filed: 07/19/2021  9:36 AM  Note Status: Written Cosign: Cosign Not Required Encounter Date: 07/19/2021  Problem: Chronic respiratory failure with hypoxia [facility])  Editor: Rigoberto Noel, MD (Physician)             O2 as needed in the daytime. Continue nocturnal oxygen        Patient Instructions by Rigoberto Noel, MD at 07/19/2021 9:00 AM  Author: Rigoberto Noel, MD Author Type: Physician Filed: 07/19/2021  9:31 AM  Note Status: Signed Cosign: Cosign Not Required Encounter Date: 07/19/2021  Editor: Kara Mead  V, MD (Physician)             X LDCT chest to screen for lung cancer   X Alpha 1 blood work

## 2021-11-17 ENCOUNTER — Ambulatory Visit (INDEPENDENT_AMBULATORY_CARE_PROVIDER_SITE_OTHER): Payer: Medicaid Other | Admitting: Pulmonary Disease

## 2021-11-17 ENCOUNTER — Encounter: Payer: Self-pay | Admitting: Pulmonary Disease

## 2021-11-17 DIAGNOSIS — Z87891 Personal history of nicotine dependence: Secondary | ICD-10-CM

## 2021-11-17 DIAGNOSIS — Z122 Encounter for screening for malignant neoplasm of respiratory organs: Secondary | ICD-10-CM

## 2021-11-17 NOTE — Progress Notes (Signed)
Shared Decision Making Visit Lung Cancer Screening Program [phone])   Eligibility: Age 61 y.o. Pack Years Smoking History Calculation 120  (# packs/per year x # years smoked) Recent History of coughing up blood  no Unexplained weight loss? no ( >Than 15 pounds within the last 6 months ) Prior History Lung / other cancer no (Diagnosis within the last 5 years already requiring surveillance chest CT Scans). Smoking Status Former Smoker Former Smokers: Years since quit: 6 years  Quit Date: 2017  Visit Components: Discussion included one or more decision making aids. yes Discussion included risk/benefits of screening. yes Discussion included potential follow up diagnostic testing for abnormal scans. yes Discussion included meaning and risk of over diagnosis. yes Discussion included meaning and risk of False Positives. yes Discussion included meaning of total radiation exposure. yes  Counseling Included: Importance of adherence to annual lung cancer LDCT screening. yes Impact of comorbidities on ability to participate in the program. yes Ability and willingness to under diagnostic treatment. yes  Smoking Cessation Counseling: Former Smokers:  Discussed the importance of maintaining cigarette abstinence. yes Diagnosis Code: Personal History of Nicotine Dependence. K09.381 Information about tobacco cessation classes and interventions provided to patient. Yes Patient provided with "ticket" for LDCT Scan. yes Written Order for Lung Cancer Screening with LDCT placed in Epic. Yes (CT Chest Lung Cancer Screening Low Dose W/O CM) [id number] Z12.2-Screening of respiratory organs Z87.891-Personal history of nicotine dependence   Lauraine Rinne, NP

## 2021-11-17 NOTE — Patient Instructions (Signed)
Thank you for participating in the [facility]. It was our pleasure to meet you today. We will call you with the results of your scan within the next few days. Your scan will be assigned a Lung RADS category score by the physicians reading the scans.  This Lung RADS score determines follow up scanning.  See below for description of categories, and follow up screening recommendations. We will be in touch to schedule your follow up screening annually or based on recommendations of our providers. We will fax a copy of your scan results to your Primary Care Physician, or the physician who referred you to the program, to ensure they have the results. Please call the office if you have any questions or concerns regarding your scanning experience or results.  Our office number is [phone]. Please speak with Lindsay Phelps, RN. , or  Lindsay Buckner RN, They are  our Lung Cancer Screening RN.'s If They are unavailable when you call, Please leave a message on the voice mail. We will return your call at our earliest convenience.This voice mail is monitored several times a day.  Remember, if your scan is normal, we will scan you annually as long as you continue to meet the criteria for the program. (Age 55-77, Current smoker or smoker who has quit within the last 15 years). If you are a smoker, remember, quitting is the single most powerful action that you can take to decrease your risk of lung cancer and other pulmonary, breathing related problems. We know quitting is hard, and we are here to help.  Please let us know if there is anything we can do to help you meet your goal of quitting. If you are a former smoker, congratulations. We are proud of you! Remain smoke free! Remember you can refer friends or family members through the number above.  We will screen them to make sure they meet criteria for the program. Thank you for helping us take better care of you by  participating in Lung Screening.  You can receive free nicotine replacement therapy ( patches, gum or mints) by calling 1-800-QUIT NOW. Please call so we can get you on the path to becoming  a non-smoker. I know it is hard, but you can do this!  Lung RADS Categories:  Lung RADS 1: no nodules or definitely non-concerning nodules.  Recommendation is for a repeat annual scan in 12 months.  Lung RADS 2:  nodules that are non-concerning in appearance and behavior with a very low likelihood of becoming an active cancer. Recommendation is for a repeat annual scan in 12 months.  Lung RADS 3: nodules that are probably non-concerning , includes nodules with a low likelihood of becoming an active cancer.  Recommendation is for a 6-month repeat screening scan. Often noted after an upper respiratory illness. We will be in touch to make sure you have no questions, and to schedule your 6-month scan.  Lung RADS 4 A: nodules with concerning findings, recommendation is most often for a follow up scan in 3 months or additional testing based on our provider's assessment of the scan. We will be in touch to make sure you have no questions and to schedule the recommended 3 month follow up scan.  Lung RADS 4 B:  indicates findings that are concerning. We will be in touch with you to schedule additional diagnostic testing based on our provider's  assessment of the scan.  Other options for assistance in smoking cessation (   As covered by your insurance benefits)  Hypnosis for smoking cessation  [employer] [phone]  Acupuncture for smoking cessation  [facility] [phone]   

## 2021-11-23 ENCOUNTER — Ambulatory Visit: Payer: Medicaid Other | Attending: Cardiovascular Disease | Admitting: *Deleted

## 2021-11-23 DIAGNOSIS — Z952 Presence of prosthetic heart valve: Secondary | ICD-10-CM

## 2021-11-23 DIAGNOSIS — Z5181 Encounter for therapeutic drug level monitoring: Secondary | ICD-10-CM

## 2021-11-23 DIAGNOSIS — I4892 Unspecified atrial flutter: Secondary | ICD-10-CM | POA: Diagnosis not present

## 2021-11-23 LAB — POCT INR: INR: 2.2 (ref 2.0–3.0)

## 2021-11-23 NOTE — Patient Instructions (Signed)
Continue warfarin 1 tablet daily except for 1/2 a tablet on Thursdays Recheck in 6 wks Call [phone] for questions or concerns.  

## 2021-11-24 ENCOUNTER — Ambulatory Visit (HOSPITAL_COMMUNITY)
Admission: RE | Admit: 2021-11-24 | Discharge: 2021-11-24 | Disposition: A | Discharge status: Home / Self Care | Payer: Medicaid Other | Source: Ambulatory Visit | Attending: Acute Care | Admitting: Acute Care

## 2021-11-24 DIAGNOSIS — Z87891 Personal history of nicotine dependence: Secondary | ICD-10-CM | POA: Diagnosis present

## 2021-11-24 DIAGNOSIS — Z122 Encounter for screening for malignant neoplasm of respiratory organs: Secondary | ICD-10-CM | POA: Diagnosis not present

## 2021-11-26 ENCOUNTER — Other Ambulatory Visit: Payer: Self-pay | Admitting: Acute Care

## 2021-11-26 DIAGNOSIS — Z87891 Personal history of nicotine dependence: Secondary | ICD-10-CM

## 2021-11-26 DIAGNOSIS — Z122 Encounter for screening for malignant neoplasm of respiratory organs: Secondary | ICD-10-CM

## 2021-12-01 ENCOUNTER — Other Ambulatory Visit: Payer: Self-pay | Admitting: Cardiovascular Disease

## 2021-12-01 DIAGNOSIS — I4892 Unspecified atrial flutter: Secondary | ICD-10-CM

## 2021-12-01 NOTE — Telephone Encounter (Signed)
Refill request for warfarin:  Last INR was 2.2 on 11/23/21 Next INR due on 01/04/22 LOV was 06/14/21 Estevan Ryder MD  Refill approved.

## 2021-12-16 ENCOUNTER — Ambulatory Visit: Payer: Medicaid Other | Admitting: Pulmonary Disease

## 2021-12-31 ENCOUNTER — Other Ambulatory Visit: Payer: Self-pay | Admitting: Pulmonary Disease

## 2022-01-04 ENCOUNTER — Ambulatory Visit: Payer: Medicaid Other | Attending: Cardiovascular Disease | Admitting: *Deleted

## 2022-01-04 DIAGNOSIS — Z5181 Encounter for therapeutic drug level monitoring: Secondary | ICD-10-CM

## 2022-01-04 DIAGNOSIS — I4892 Unspecified atrial flutter: Secondary | ICD-10-CM

## 2022-01-04 LAB — POCT INR: INR: 2.2 (ref 2.0–3.0)

## 2022-01-04 NOTE — Patient Instructions (Signed)
Continue warfarin 1 tablet daily except for 1/2 a tablet on Thursdays Recheck in 6 wks Call [phone] for questions or concerns.

## 2022-01-11 DIAGNOSIS — R053 Chronic cough: Secondary | ICD-10-CM | POA: Insufficient documentation

## 2022-02-01 ENCOUNTER — Other Ambulatory Visit: Payer: Self-pay | Admitting: Cardiovascular Disease

## 2022-02-01 NOTE — Telephone Encounter (Signed)
Rx refill sent to pharmacy. 

## 2022-02-05 ENCOUNTER — Encounter: Payer: Self-pay | Admitting: Family Medicine

## 2022-02-10 ENCOUNTER — Encounter: Payer: Self-pay | Admitting: Pulmonary Disease

## 2022-02-10 ENCOUNTER — Ambulatory Visit: Payer: Medicaid Other | Admitting: Pulmonary Disease

## 2022-02-10 VITALS — BP 136/84 | HR 93 | Ht 63.0 in | Wt 184.8 lb

## 2022-02-10 DIAGNOSIS — J9611 Chronic respiratory failure with hypoxia: Secondary | ICD-10-CM

## 2022-02-10 DIAGNOSIS — F17200 Nicotine dependence, unspecified, uncomplicated: Secondary | ICD-10-CM

## 2022-02-10 DIAGNOSIS — J432 Centrilobular emphysema: Secondary | ICD-10-CM | POA: Diagnosis not present

## 2022-02-10 NOTE — Assessment & Plan Note (Signed)
Continue Breztri. Use albuterol for rescue. We discussed signs and symptoms of COPD exacerbation and action plan for the same

## 2022-02-10 NOTE — Assessment & Plan Note (Signed)
Continue nocturnal use of oxygen, in the daytime she will use it as needed. She will speak to DME about a different POC, does seem like she prefers continuous oxygen rather than actuated because inspiratory strength is low

## 2022-02-10 NOTE — Assessment & Plan Note (Signed)
Continue annual low-dose CT scan screening for lung cancer

## 2022-02-10 NOTE — Progress Notes (Signed)
   Subjective:    Patient ID: Lindsay Bonilla, female    DOB: 1960-03-09, 61 y.o.   MRN: [id number]  HPI  61 yo ex-smoker for FU of COPD and chronic hypoxic respiratory failure on 2 L of oxygen -quit smoking 2017, 1-2 PPD, >50 PYrs -last flare /ED visit 10/2020 -f/h/o COPD including a nephew @ 4 years old    PMH - s/p Aortic Valve Replacement/s/p Bentall procedure per Dr. Cyndia Bent on 05/2015 [employer] Mechanical valved graft , original AVR in 1998 - sinus node dysfunction on Cardizem - HFpEF- on Lasix  -01/2020 covid infection -chronic back pain on percocet    Chief Complaint  Patient presents with   Follow-up   She complains of significant anxiety. She feels that POC does not help much since she is not able to actuate the breath.  Home concentrator works much better. She had COVID infection 09/2021 , then developed a cough, this will resolve with a course of prednisone. She is compliant with Judithann Sauger still complains of dyspnea on exertion.  She does not go to a store since she is afraid she will not get a cart to lean on when walking .  She has a significant family history of emphysema but alpha-1 levels were normal.   Significant tests/ events reviewed   PFTs 04/2015 >>ratio 33, FEV1 0.69 /25% , improved to 0.95/35% with BD, FVC 61%, TLC 126 %, DLCO 80  06/2021 Alpha 1 137, MM LDCT chest 10/2021>> RLL 85m nodule, stable   CT chest 07/2018 Coronary artery calcifications. Status post aortic valve and root graft repair, with probable reconstruction of the pulmonary outflow tract . Severe emphysema. Mild, diffuse bilateral bronchial wall thickening.   Echo 04/2019 nml LV fn, RVSP 37   03/2017 NPSG >> no OSA, min desatn 77%   Review of Systems neg for any significant sore throat, dysphagia, itching, sneezing, nasal congestion or excess/ purulent secretions, fever, chills, sweats, unintended wt loss, pleuritic or exertional cp, hempoptysis, orthopnea pnd or change in chronic leg swelling.  Also denies presyncope, palpitations, heartburn, abdominal pain, nausea, vomiting, diarrhea or change in bowel or urinary habits, dysuria,hematuria, rash, arthralgias, visual complaints, headache, numbness weakness or ataxia.     Objective:   Physical Exam  Gen. Pleasant, obese, in no distress ENT - no lesions, no post nasal drip Neck: No JVD, no thyromegaly, no carotid bruits Lungs: no use of accessory muscles, no dullness to percussion, decreased without rales or rhonchi  Cardiovascular: Rhythm regular, metallic S2, no murmurs or gallops, no peripheral edema Musculoskeletal: No deformities, no cyanosis or clubbing , no tremors       Assessment & Plan:

## 2022-02-10 NOTE — Patient Instructions (Addendum)
  Continue on Breztri  Please talk to [place] about different POC

## 2022-02-11 ENCOUNTER — Telehealth: Payer: Self-pay

## 2022-02-11 MED ORDER — AMOXICILLIN 500 MG PO CAPS: ORAL_CAPSULE | ORAL | 3 refills | Status: DC

## 2022-02-11 NOTE — Telephone Encounter (Signed)
Patient with diagnosis of mechanical AVR and afib on warfarin for anticoagulation.    Procedure: 3 dental extractions Date of procedure: TBD  CrCl 71m/min using adjusted body weight Platelet count 285K  Patient does require pre-op antibiotics for dental procedure given prior history of endocarditis and mechanical valve. I have sent a refill on her amoxicillin to her pharmacy.  Per office protocol, patient can hold warfarin for 5 days prior to procedure. Patient will need bridging with Lovenox '80mg'$  BID around procedure. She has been bridged in the past for previous procedures. [facility]  **This guidance is not considered finalized until pre-operative APP has relayed final recommendations.**

## 2022-02-11 NOTE — Telephone Encounter (Signed)
   Primary Cardiologist: Lauree Chandler, MD  Chart reviewed as part of pre-operative protocol coverage. Given past medical history and time since last visit, based on ACC/AHA guidelines, Lindsay Bonilla would be at acceptable risk for the planned procedure without further cardiovascular testing.   Patient does require pre-op antibiotics for dental procedure given prior history of endocarditis and mechanical valve. I have sent a refill on her amoxicillin to her pharmacy.   Per office protocol, patient can hold warfarin for 5 days prior to procedure. Patient will need bridging with Lovenox '80mg'$  BID around procedure. She has been bridged in the past for previous procedures. Bridge to be coordinated by [facility] where pt is followed.  She may hold aspirin for 5-7 days prior to procedure.   I will route this recommendation to the requesting party via Epic fax function and remove from pre-op pool.  Please call with questions.  Emmaline Life, NP-C  02/11/2022, 1:53 PM [address], [address] Office [phone] Fax [phone]

## 2022-02-11 NOTE — Telephone Encounter (Signed)
...     Pre-operative Risk Assessment    Patient Name: Lindsay Bonilla  DOB: 07/27/1960 MRN: [id number]     Request for Surgical Clearance    Procedure:  Dental Extraction - Amount of Teeth to be Pulled:  3  Date of Surgery:  Clearance TBD                                 Surgeon:  dr Durene Cal Surgeon's Group or Practice Name:  [facility] Phone number:  [phone] Fax number:  [phone]   Type of Clearance Requested:   - Medical  - Pharmacy:  Hold Aspirin and Warfarin (Coumadin)     Type of Anesthesia:  Local    Additional requests/questions:    [name]   02/11/2022, 8:37 AM

## 2022-02-15 ENCOUNTER — Ambulatory Visit: Payer: Medicaid Other | Attending: Cardiovascular Disease | Admitting: *Deleted

## 2022-02-15 DIAGNOSIS — Z5181 Encounter for therapeutic drug level monitoring: Secondary | ICD-10-CM

## 2022-02-15 DIAGNOSIS — I4892 Unspecified atrial flutter: Secondary | ICD-10-CM | POA: Diagnosis not present

## 2022-02-15 LAB — POCT INR: INR: 2.3 (ref 2.0–3.0)

## 2022-02-15 NOTE — Patient Instructions (Signed)
Continue warfarin 1 tablet daily except for 1/2 a tablet on Thursdays Recheck in 6 wks Call [phone] for questions or concerns.  Pending dental extractions. Date TBD.  Will hold warfarin 5 days before procedure and bridge with Lovenox.

## 2022-03-28 ENCOUNTER — Encounter: Payer: Self-pay | Admitting: Pulmonary Disease

## 2022-03-28 NOTE — Telephone Encounter (Signed)
Hello I have been reading a lot about this drug and I would like to give it a try. There is good reviews and it is used for asthma and COPD which I have both. Could you please give it a thought? Thanks  [name]  Dr. Elsworth Soho please advise. Patient is asking about Dupixent

## 2022-03-29 ENCOUNTER — Ambulatory Visit: Payer: Medicaid Other | Attending: Cardiovascular Disease | Admitting: *Deleted

## 2022-03-29 DIAGNOSIS — Z5181 Encounter for therapeutic drug level monitoring: Secondary | ICD-10-CM | POA: Diagnosis not present

## 2022-03-29 DIAGNOSIS — I4892 Unspecified atrial flutter: Secondary | ICD-10-CM | POA: Diagnosis not present

## 2022-03-29 LAB — POCT INR: INR: 2.2 (ref 2.0–3.0)

## 2022-03-29 NOTE — Patient Instructions (Signed)
Continue warfarin 1 tablet daily except for 1/2 a tablet on Thursdays Recheck in 6 wks Call [phone] for questions or concerns.  Pending dental extractions. Date TBD.  Will hold warfarin 5 days before procedure and bridge with Lovenox.

## 2022-04-05 ENCOUNTER — Other Ambulatory Visit: Payer: Self-pay | Admitting: Pulmonary Disease

## 2022-04-18 ENCOUNTER — Telehealth: Payer: Self-pay | Admitting: Cardiovascular Disease

## 2022-04-18 NOTE — Telephone Encounter (Signed)
   Primary Cardiologist: Lauree Chandler, MD  Chart reviewed as part of pre-operative protocol coverage. Simple dental extractions (1-2 teeth) are considered low risk procedures per guidelines and generally do not require any specific cardiac clearance. It is also generally accepted that for simple extractions and dental cleanings, there is no need to interrupt blood thinner therapy.   SBE prophylaxis is required for the patient. She has a Rx for amoxicillin at her pharmacy.   I will route this recommendation to the requesting party via Epic fax function and remove from pre-op pool.  Please call with questions.  Emmaline Life, NP-C  04/18/2022, 3:25 PM [address], [address] Office [phone] Fax [phone]

## 2022-04-18 NOTE — Telephone Encounter (Signed)
Lindsay Bonilla, Shanell39 minutes ago (1:20 PM)   [facility] Office calling back to make our office aware that pt is only getting 1 dental extraction instead of 3 from prior clearance on 02/11/22. Please advise.      Note   Susie- Dr Redmond Pulling [facility] [phone]  [name] minutes ago (1:16 PM)   hours are from 7:30-3pm, if aafter 3pm call [phone]  Incoming call    I am not sure if the pt had their procedure back in 01/2022. The message above was a call from the DDS office today.

## 2022-04-18 NOTE — Telephone Encounter (Signed)
Office calling back to make our office aware that pt is only getting 1 dental extraction instead of 3 from prior clearance on 02/11/22. Please advise.

## 2022-04-19 NOTE — Telephone Encounter (Signed)
Returned call to [facility], no answer.  I faxed over the recommendations below, to the fax #  [phone]

## 2022-04-19 NOTE — Telephone Encounter (Signed)
Susie from requesting office is calling back to get update on the message she left yesterday in regards to the change in extractions. Requesting return call.

## 2022-04-19 NOTE — Telephone Encounter (Signed)
Clearance has been faxed and includes information for 1-2 extractions (no need to hold warfarin) as well as instructions for more extractions. If she needs more than 2 teeth extracted, she will hold warfarin for 5 days and will need to bridged with Lovenox.

## 2022-05-10 ENCOUNTER — Ambulatory Visit: Payer: Medicaid Other | Attending: Cardiovascular Disease | Admitting: *Deleted

## 2022-05-10 DIAGNOSIS — Z5181 Encounter for therapeutic drug level monitoring: Secondary | ICD-10-CM | POA: Diagnosis not present

## 2022-05-10 DIAGNOSIS — I4892 Unspecified atrial flutter: Secondary | ICD-10-CM

## 2022-05-10 LAB — POCT INR: INR: 2.8 (ref 2.0–3.0)

## 2022-05-10 NOTE — Patient Instructions (Signed)
Continue warfarin 1 tablet daily except for 1/2 a tablet on Thursdays Recheck in 6 wks Call [phone] for questions or concerns.  

## 2022-06-02 ENCOUNTER — Telehealth: Payer: Self-pay | Admitting: Cardiovascular Disease

## 2022-06-02 NOTE — Telephone Encounter (Signed)
Pt letting me know that yesterday she took her PM meds in the AM.  Told her to continue with current coumadin schedule with tonight's dose.  She verbalized understanding.

## 2022-06-02 NOTE — Telephone Encounter (Signed)
Pt is requesting to speak with Lattie Haw

## 2022-06-10 ENCOUNTER — Ambulatory Visit: Payer: Medicaid Other | Admitting: Physician Assistant

## 2022-06-21 ENCOUNTER — Ambulatory Visit: Payer: Medicaid Other | Attending: Cardiovascular Disease | Admitting: *Deleted

## 2022-06-21 DIAGNOSIS — Z5181 Encounter for therapeutic drug level monitoring: Secondary | ICD-10-CM | POA: Diagnosis not present

## 2022-06-21 DIAGNOSIS — I4892 Unspecified atrial flutter: Secondary | ICD-10-CM

## 2022-06-21 LAB — POCT INR: INR: 3 (ref 2.0–3.0)

## 2022-06-21 NOTE — Patient Instructions (Signed)
Continue warfarin 1 tablet daily except for 1/2 a tablet on Thursdays Recheck in 6 wks Call [phone] for questions or concerns.  

## 2022-07-01 ENCOUNTER — Other Ambulatory Visit: Payer: Self-pay | Admitting: Cardiovascular Disease

## 2022-07-01 DIAGNOSIS — E782 Mixed hyperlipidemia: Secondary | ICD-10-CM

## 2022-07-01 DIAGNOSIS — I4892 Unspecified atrial flutter: Secondary | ICD-10-CM

## 2022-07-01 NOTE — Telephone Encounter (Signed)
Prescription refill request received for warfarin Lov: [place], 06/14/2021 Next INR check: 6/4 Warfarin tablet strength: 5mg     Pt is overdue for an office visit.

## 2022-07-27 ENCOUNTER — Ambulatory Visit: Payer: Medicaid Other | Admitting: Physician Assistant

## 2022-07-30 ENCOUNTER — Other Ambulatory Visit: Payer: Self-pay | Admitting: Cardiovascular Disease

## 2022-08-02 ENCOUNTER — Ambulatory Visit: Payer: Medicaid Other | Attending: Cardiovascular Disease | Admitting: *Deleted

## 2022-08-02 DIAGNOSIS — I4892 Unspecified atrial flutter: Secondary | ICD-10-CM | POA: Diagnosis not present

## 2022-08-02 DIAGNOSIS — Z5181 Encounter for therapeutic drug level monitoring: Secondary | ICD-10-CM

## 2022-08-02 LAB — POCT INR: INR: 2.4 (ref 2.0–3.0)

## 2022-08-02 NOTE — Patient Instructions (Signed)
Continue warfarin 1 tablet daily except for 1/2 a tablet on Thursdays Recheck in 6 wks Call [phone] for questions or concerns.  

## 2022-08-05 ENCOUNTER — Other Ambulatory Visit: Payer: Self-pay | Admitting: Pulmonary Disease

## 2022-08-11 ENCOUNTER — Ambulatory Visit: Payer: Medicaid Other | Admitting: Nurse Practitioner

## 2022-08-11 ENCOUNTER — Encounter: Payer: Self-pay | Admitting: Nurse Practitioner

## 2022-08-11 VITALS — BP 108/72 | HR 87 | Ht 63.0 in | Wt 183.2 lb

## 2022-08-11 DIAGNOSIS — J4489 Other specified chronic obstructive pulmonary disease: Secondary | ICD-10-CM | POA: Diagnosis not present

## 2022-08-11 DIAGNOSIS — J449 Chronic obstructive pulmonary disease, unspecified: Secondary | ICD-10-CM | POA: Diagnosis not present

## 2022-08-11 DIAGNOSIS — I272 Pulmonary hypertension, unspecified: Secondary | ICD-10-CM

## 2022-08-11 DIAGNOSIS — J439 Emphysema, unspecified: Secondary | ICD-10-CM

## 2022-08-11 DIAGNOSIS — G4733 Obstructive sleep apnea (adult) (pediatric): Secondary | ICD-10-CM | POA: Insufficient documentation

## 2022-08-11 DIAGNOSIS — R0609 Other forms of dyspnea: Secondary | ICD-10-CM

## 2022-08-11 DIAGNOSIS — I5032 Chronic diastolic (congestive) heart failure: Secondary | ICD-10-CM

## 2022-08-11 DIAGNOSIS — G4719 Other hypersomnia: Secondary | ICD-10-CM | POA: Diagnosis not present

## 2022-08-11 DIAGNOSIS — R0683 Snoring: Secondary | ICD-10-CM

## 2022-08-11 DIAGNOSIS — J9611 Chronic respiratory failure with hypoxia: Secondary | ICD-10-CM

## 2022-08-11 DIAGNOSIS — F418 Other specified anxiety disorders: Secondary | ICD-10-CM

## 2022-08-11 MED ORDER — ARFORMOTEROL TARTRATE 15 MCG/2ML IN NEBU
15.0000 ug | INHALATION_SOLUTION | Freq: Two times a day (BID) | RESPIRATORY_TRACT | 3 refills | Status: DC
Start: 2022-08-11 — End: 2022-11-01

## 2022-08-11 MED ORDER — BUDESONIDE 0.5 MG/2ML IN SUSP
0.5000 mg | Freq: Two times a day (BID) | RESPIRATORY_TRACT | 3 refills | Status: DC
Start: 2022-08-11 — End: 2022-08-29

## 2022-08-11 MED ORDER — REVEFENACIN 175 MCG/3ML IN SOLN
175.0000 ug | Freq: Every day | RESPIRATORY_TRACT | 3 refills | Status: DC
Start: 2022-08-11 — End: 2022-11-01

## 2022-08-11 NOTE — Assessment & Plan Note (Signed)
She has snoring, excessive daytime sleepiness, nocturnal apneic events, orthopnea. BMI 32. Given this,  I am concerned she could have sleep disordered breathing with obstructive sleep apnea. She will need sleep study for further evaluation.    - discussed how weight can impact sleep and risk for sleep disordered breathing - discussed options to assist with weight loss: combination of diet modification, cardiovascular and strength training exercises   - had an extensive discussion regarding the adverse health consequences related to untreated sleep disordered breathing - specifically discussed the risks for hypertension, coronary artery disease, cardiac dysrhythmias, cerebrovascular disease, and diabetes - lifestyle modification discussed   - discussed how sleep disruption can increase risk of accidents, particularly when driving - safe driving practices were discussed

## 2022-08-11 NOTE — Assessment & Plan Note (Signed)
Euvolemic on exam.  See above plan. 

## 2022-08-11 NOTE — Assessment & Plan Note (Signed)
Very severe COPD with high symptom burden.  She also has symptoms of chronic bronchitis.  No recent exacerbations requiring steroids or antibiotics.  Last flare was after COVID in August 2023, almost a year ago.  She tells me that she has a lot of trouble at home.  She does feel like she has exacerbations but she does not like to use prednisone because she does not feel like it does much for her.  Has not had any hospitalizations.  We had a very lengthy discussion surrounding appropriate treatment options and why Dupixent would not be a preferred option for her right now.  She does not appear to have type II inflammation but we will obtain CBC with differential to assess eosinophil count. If future studies show dupixent is appropriate in assisting in the management of COPD without recurrent exacerbations, could revisit if eosinophils >300. I think she could receive benefit from maximizing her bronchodilator regimen, management of her chronic bronchitis, and conditioning regimen. Recommend trialing triple therapy nebulizers and holding Breztri to see if she has more of a perceived benefit from this.  We also discussed starting on Daliresp.  Medication side effect profile reviewed of all medications.  Will check CMET to ensure liver function is nl. She was hesitant to participate in pulmonary rehab. Strongly encouraged her to consider it - she is willing to try. Referral placed today.  I am also concerned for PH with the severity of her lung disease and previous echo. She does not have significant volume overload, so I am not sure she would be a candidate for any additional medications. We discussed how this could contribute to her daily dyspnea. Possible she has a Group 2 component given her cardiac history. Will repeat echo to reassess.  Patient Instructions  We will switch your Breztri to nebulizer medications to see if you receive better benefit... Arformoterol 2 mL neb Twice daily  Budesonide 2 mL neb Twice  daily. Brush tongue and rinse mouth after use. You can combine this with the arformoterol  Yupelri 3 mL neb once daily. Do not mix this with your other two nebs.   Continue Albuterol inhaler 2 puffs or 3 mL neb every 6 hours as needed for shortness of breath or wheezing. Notify if symptoms persist despite rescue inhaler/neb use.  Continue singulair (montelukast) 1 tab At bedtime  Continue supplemental oxygen 2 lpm for goal >88-90%  Labs today  After we get your labs back, we will start Daliresp (roflumilast) if your liver function is stable. You will take 250 mcg daily for 4 weeks then increased to 500 mcg daily   Echocardiogram ordered to re-evaluate your heart function and pulmonary hypertension.  We will have you redo a sleep study given your symptoms of snoring and excessive daytime fatigue. Someone will contact you to schedule this.   Referral to pulmonary rehab  Follow up with Dr. Vassie Loll in 8 weeks to go over sleep study and echo results and see how new medications are working. If symptoms do not improve or worsen, please contact office for sooner follow up or seek emergency care.

## 2022-08-11 NOTE — Patient Instructions (Addendum)
We will switch your Breztri to nebulizer medications to see if you receive better benefit... Arformoterol 2 mL neb Twice daily  Budesonide 2 mL neb Twice daily. Brush tongue and rinse mouth after use. You can combine this with the arformoterol  Yupelri 3 mL neb once daily. Do not mix this with your other two nebs.   Continue Albuterol inhaler 2 puffs or 3 mL neb every 6 hours as needed for shortness of breath or wheezing. Notify if symptoms persist despite rescue inhaler/neb use.  Continue singulair (montelukast) 1 tab At bedtime  Continue supplemental oxygen 2 lpm for goal >88-90%  Labs today  After we get your labs back, we will start Daliresp (roflumilast) if your liver function is stable. You will take 250 mcg daily for 4 weeks then increased to 500 mcg daily   Echocardiogram ordered to re-evaluate your heart function and pulmonary hypertension.  We will have you redo a sleep study given your symptoms of snoring and excessive daytime fatigue. Someone will contact you to schedule this.   Referral to pulmonary rehab  Follow up with Dr. Vassie Loll in 8 weeks to go over sleep study and echo results and see how new medications are working. If symptoms do not improve or worsen, please contact office for sooner follow up or seek emergency care.

## 2022-08-11 NOTE — Assessment & Plan Note (Signed)
Stable without increased O2 requirement. She did well on her walk test today without desaturations or visualized dyspnea on room air. Encouraged her to continue monitoring at home for goal >88-90%. She reached out to [facility] about replaced her concentrator. Awaiting a call back.

## 2022-08-11 NOTE — Progress Notes (Signed)
@Patient  ID: Lindsay Bonilla, female    DOB: Jul 21, 1960, 62 y.o.   MRN: [id number]  Chief Complaint  Patient presents with   Follow-up    Pt f/u states that she is wanting to try another medication besides Breztri. Reports that she has increased fatigue and little interest in doing things.     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 She is a patient of Dr. Reginia Naas and last seen in office 02/10/2022. Past medical history significant for migraines, CAD, aortic valve disease s/p aortic valve replacement and Bentall procedure, sinus node dysfunction, CHF, HLD, depression, anxiety.   TEST/EVENTS:  05/08/2015 PFT: FVC 61, FEV1 25, ratio 42, TLC 126, DLCO 80.  Very severe obstructive lung disease with reversibility and normal diffusing capacity 2019 PSG: No significant OSA 05/21/2019 echo: EF 55 to 60%.  Mild LVH.  Diastolic parameters are indeterminate.  RV size and function is normal.  Mildly elevated PASP.  LA mildly dilated.  Trivial MR.  Aortic valve repair with mechanical AVR. 11/24/2021 LDCT chest: Prior aortic valve repair.  Three-vessel CAD.  Severe centrilobular emphysema.  Small solid pulmonary nodule in the right lower lobe, 4.3 mm.  02/10/2022: OV with Dr. Vassie Loll.  Complains of significant anxiety.  Does not feel that PAC helps much.  Home concentrator works much better.  Had COVID in 09/2021 then developed a cough.  Resolved with a course of prednisone.  She is compliant with Breztri.  Still complains of dyspnea on exertion.  She does not go to a store since she is afraid she will not get a cart to lean on when walking.  Significant family history of emphysema but alpha-1 levels were normal.  Continue low-dose lung cancer screening CT.  08/11/2022: Today - follow up Patient presents today for follow up. She had sent a message in January requesting to try  Dupixent. She was told by Dr. Vassie Loll that she mainly has COPD so would not be recommended for treatment. She wanted to discuss further at a future visit. She tells me she has done a lot of reading on this. She has seen that it is being used in those with COPD. She has not had any exacerbations since she was last seen requiring steroids or abx. She does not feel she receives benefit from steroids. She is very frustrated by the status of her breathing. She feels she cannot do anything without getting out of breath.  This has been an ongoing problem for her for many years.  Does not feel like her symptoms are acutely worse.  She does not think she could do pulmonary rehab, which has been recommended to her before. She has no perceived benefit from [facility]. She does get benefit from her nebulizers, but these only work for a short period. She has a daily cough, that is usually productive with cream colored phlegm; baseline. She notices some occasional wheezing. She will notice swelling in the legs every once in a while and use her lasix. She does not use this on a weekly basis. She does have trouble laying flat at night; has to sleep propped up. She has a lot of daytime fatigue. Little interest in doing anything, which she feels is a result of her troubles with her breathing. She has been told she snores loudly. Previously told she stops breathing when she sleeps. She denies any chest pain, palpitations,  hemoptysis, PND, syncope, lightheaded/dizziness, sinus symptoms.  She has not had any increased oxygen requirements.  She does have portable oxygen concentrator at home.  Her home continuous oxygen concentrator has stopped working.  She is waiting on [employer] to help her with this. She had a lung cancer screening CT in September; lung RADS 2. She had an echo in 2021 that showed mild pulmonary hypertension, LVH. She did have a sleep study in 2019, which was negative for OSA. Feels like symptoms have progressed since.    Allergies  Allergen Reactions   Alpha-Gal     Red meat   Beta Adrenergic Blockers Anaphylaxis   Doxycycline Other (See Comments)    "Swelling, dizziness, sleepy, talking out of my head, almost caused liver failure"   Peanut-Containing Drug Products Anaphylaxis, Shortness Of Breath, Swelling and Other (See Comments)    Swelling of the throat   Vancomycin Swelling    Rash and severe swelling   Avelox [Moxifloxacin Hcl In Nacl] Rash   Ciprofloxacin Nausea And Vomiting   Cleocin [Clindamycin Hcl] Rash   Moxifloxacin Rash   Sulfamethoxazole-Trimethoprim Rash   Tape Other (See Comments)    White clear itches, rash ONLY use paper tape    Immunization History  Administered Date(s) Administered   Hepatitis B 03/04/2002   Hepatitis B, ADULT 03/04/2002, 12/23/2015, 12/15/2016   Influenza Inj Mdck Quad Pf 11/28/2019   Influenza Split 12/15/2003, 01/25/2005   Influenza Whole 12/29/2020   Influenza,inj,quad, With Preservative 12/23/2015, 12/15/2016   Influenza-Unspecified 11/11/2010, 01/31/2012, 12/12/2012, 11/07/2013, 12/17/2014, 12/14/2015, 12/15/2016, 12/07/2017, 11/26/2018, 11/27/2019   Moderna Sars-Covid-2 Vaccination 05/16/2019, 06/14/2019, 01/01/2020, 06/24/2020   Pneumococcal Conjugate-13 11/26/2018, 11/28/2019   Pneumococcal Polysaccharide-23 01/25/2005, 11/11/2010, 10/06/2011, 12/15/2016, 12/07/2017   Pneumococcal-Unspecified 01/25/2005, 12/15/2016   Td 12/23/2015   Tdap 08/08/2010, 12/23/2015, 12/30/2018    Past Medical History:  Diagnosis Date   Anxiety    Aortic aneurysm (HCC)    Aortic insufficiency    a. s/p Bentall with mechanical AVR 4/17 >> FU Echo 6/17: Mild LVH, EF 60-65%, normal wall motion, ventricular septum with paradoxical septal motion, [employer] mechanical AVR functioning  normally with no regurgitation (mean 6 mmHg), MAC, trivial MR, mild RVE, trivial TR   Aortic stenosis, severe    Bicuspid valve   Arthritis    Asthma    Atrial flutter (HCC)     Bitten or stung by nonvenomous insect and other nonvenomous arthropods, initial encounter    Bronchitis    Chronic diastolic CHF (congestive heart failure) (HCC)    Chronic respiratory failure with hypoxia (HCC)    COPD (chronic obstructive pulmonary disease) (HCC)    DDD (degenerative disc disease), cervical    Depression    Endocarditis, valve unspecified    GERD (gastroesophageal reflux disease)    Hard of hearing    Heart failure (HCC)    Heart failure, unspecified (HCC)    Hemorrhage, not elsewhere classified    History of pneumonia    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   Hyperkalemia    Low back pain    Migraine headache    Mild CAD    Other chronic pain    Palpitations    PAT (paroxysmal atrial tachycardia)    PONV (postoperative nausea and vomiting)    "only once"   Premature atrial contractions    PVC's (premature ventricular contractions)    Shortness of breath    Nodule left lung CT done 8/6   Sinus bradycardia  Stress incontinence    Unspecified osteoarthritis, unspecified site    Valvular heart disease     Tobacco History: Social History   Tobacco Use  Smoking Status Former   Packs/day: 0.10   Years: 30.00   Additional pack years: 0.00   Total pack years: 3.00   Types: Cigarettes   Quit date: 04/29/2015   Years since quitting: 7.2  Smokeless Tobacco Never   Counseling given: Not Answered   Outpatient Medications Prior to Visit  Medication Sig Dispense Refill   albuterol (PROVENTIL) (2.5 MG/3ML) 0.083% nebulizer solution Take 3 mLs (2.5 mg total) by nebulization every 4 (four) hours as needed for wheezing or shortness of breath. 120 mL 11   amoxicillin (AMOXIL) 500 MG capsule TAKE 4 TABLETS BY MOUTH 30-60 MINUTES PRIOR TO DENTAL PROCEDURES 4 capsule 3   aspirin EC 81 MG tablet Take 1 tablet (81 mg total) by mouth daily.     Budeson-Glycopyrrol-Formoterol (BREZTRI AEROSPHERE) 160-9-4.8 MCG/ACT AERO INHALE 2 PUFFS INTO THE  LUNGS IN THE MORNING AND AT BEDTIME. 10.7 g 11   chlorpheniramine (CHLOR-TRIMETON) 4 MG tablet Take 4 mg by mouth See admin instructions. Take 4 mg in the morning and 8 mg at bedtime     Cholecalciferol (VITAMIN D) 125 MCG (5000 UT) CAPS Take 5,000 Units by mouth daily.     enoxaparin (LOVENOX) 80 MG/0.8ML injection Inject 0.8 mLs (80 mg total) into the skin every 12 (twelve) hours. 16 mL 0   EPINEPHrine (EPIPEN) 0.3 mg/0.3 mL IJ SOAJ injection Inject 0.3 mg into the muscle as needed for anaphylaxis. For allergic reaction     furosemide (LASIX) 80 MG tablet Take 1 tablet (80 mg total) by mouth 2 (two) times daily. (Patient taking differently: Take 80 mg by mouth daily as needed for edema.) 60 tablet 11   gabapentin (NEURONTIN) 600 MG tablet Take 600 mg by mouth 2 (two) times daily.     ipratropium (ATROVENT) 0.02 % nebulizer solution Take 2.5 mLs (0.5 mg total) by nebulization 4 (four) times daily. (Patient taking differently: Take 0.5 mg by nebulization every 4 (four) hours as needed for wheezing or shortness of breath.) 75 mL 12   linaclotide (LINZESS) 145 MCG CAPS capsule Take 145 mcg by mouth every morning.     losartan (COZAAR) 25 MG tablet TAKE 1 TABLET ONCE DAILY. 30 tablet 4   magnesium oxide (MAG-OX) 400 MG tablet Take 1 tablet (400 mg total) by mouth daily. 90 tablet 3   metolazone (ZAROXOLYN) 2.5 MG tablet TAKE 1 TABLET BY MOUTH ON MONDAY, WEDNESDAY AND FRIDAY. 36 tablet 0   montelukast (SINGULAIR) 10 MG tablet Take 1 tablet (10 mg total) by mouth at bedtime. 30 tablet 3   oxyCODONE-acetaminophen (PERCOCET) 10-325 MG tablet Take 1 tablet by mouth 4 (four) times daily as needed for pain.     potassium chloride (KLOR-CON) 10 MEQ tablet TAKE EIGHT TABLETS BY MOUTH TWICE DAILY (Patient taking differently: 80 mEq daily as needed (with lasix).) 480 tablet 6   pravastatin (PRAVACHOL) 40 MG tablet TAKE 1 TABLET EVERY EVENING. 30 tablet 4   promethazine (PHENERGAN) 12.5 MG tablet Take 1 tablet  (12.5 mg total) by mouth every 6 (six) hours as needed for nausea or vomiting. Take one tablet every 6 hours as needed with colonoscopy prep for nausea 10 tablet 0   Sodium Sulfate-Mag Sulfate-KCl (SUTAB) [phone] MG TABS Take as directed per colonoscopy prep 24 tablet 0   traZODone (DESYREL) 100 MG tablet Take 100 mg  by mouth at bedtime.     VENTOLIN HFA 108 (90 Base) MCG/ACT inhaler INHALE 1 PUFF INTO THE LUNGS 4 TIME DAILY AS NEEDED FOR SHORTNESS OF BREATH. 18 g 0   warfarin (COUMADIN) 5 MG tablet Take 1/2 a tablet to 1 tablet by mouth daily as directed by the coumadin clinic 30 tablet 1   omeprazole (PRILOSEC OTC) 20 MG tablet Take 20 mg by mouth daily.     No facility-administered medications prior to visit.     Review of Systems:   Constitutional: No weight loss or gain, night sweats, fevers, chills, or lassitude. +fatigue  HEENT: No headaches, difficulty swallowing, tooth/dental problems, or sore throat. No sneezing, itching, ear ache, nasal congestion, or post nasal drip CV:  +occasional lower extremity swelling. No chest pain, orthopnea, PND, anasarca, dizziness, palpitations, syncope Resp: +shortness of breath with minimal exertion; chronic cough; occasional wheeze. No hemoptysis. No chest wall deformity GI:  No heartburn, indigestion, abdominal pain, nausea, vomiting, diarrhea, change in bowel habits, loss of appetite, bloody stools.  Skin: No rash, lesions, ulcerations MSK:  No joint pain or swelling.  Neuro: No dizziness or lightheadedness.  Psych: +depression, anxiety. No SI/HI. Mood stable.     Physical Exam:  BP 108/72   Pulse 87   Ht 5\' 3"  (1.6 m)   Wt 183 lb 3.2 oz (83.1 kg)   SpO2 93%   BMI 32.45 kg/m   GEN: Tearful at times, interactive, well-kempt; obese; in no acute distress HEENT:  Normocephalic and atraumatic. PERRLA. Sclera white. Nasal turbinates pink, moist and patent bilaterally. No rhinorrhea present. Oropharynx pink and moist, without exudate or  edema. No lesions, ulcerations, or postnasal drip.  NECK:  Supple w/ fair ROM. No JVD present. Normal carotid impulses w/o bruits. Thyroid symmetrical with no goiter or nodules palpated. No lymphadenopathy.   CV: RRR, mechanical click, no peripheral edema. Pulses intact, +2 bilaterally. No cyanosis, pallor or clubbing. PULMONARY:  Unlabored, regular breathing. Diminished bibasilar airflow otherwise clear bilaterally A&P w/o wheezes/rales/rhonchi. No accessory muscle use.  GI: BS present and normoactive. Soft, non-tender to palpation. No organomegaly or masses detected.  MSK: No erythema, warmth or tenderness. Cap refil <2 sec all extrem. No deformities or joint swelling noted.  Neuro: A/Ox3. No focal deficits noted.   Skin: Warm, no lesions or rashe Psych: Normal affect and behavior. Judgement and thought content appropriate.     Lab Results:  CBC    Component Value Date/Time   WBC 6.1 05/13/2019 1128   RBC 4.35 05/13/2019 1128   HGB 12.8 05/13/2019 1128   HGB 14.0 08/03/2018 1003   HCT 40.4 05/13/2019 1128   HCT 41.3 08/03/2018 1003   PLT 253 05/13/2019 1128   PLT 322 08/03/2018 1003   MCV 92.9 05/13/2019 1128   MCV 88 08/03/2018 1003   MCH 29.4 05/13/2019 1128   MCHC 31.7 05/13/2019 1128   RDW 12.7 05/13/2019 1128   RDW 12.0 08/03/2018 1003   LYMPHSABS 1.5 05/13/2019 1128   MONOABS 0.5 05/13/2019 1128   EOSABS 0.1 05/13/2019 1128   BASOSABS 0.0 05/13/2019 1128    BMET    Component Value Date/Time   NA 138 06/28/2019 0000   K 4.1 06/28/2019 0000   CL 100 06/28/2019 0000   CO2 27 06/28/2019 0000   GLUCOSE 105 (H) 06/28/2019 0000   GLUCOSE 119 (H) 05/21/2019 1329   BUN 16 06/28/2019 0000   CREATININE 0.75 06/28/2019 0000   CREATININE 0.85 10/22/2015 0949   CALCIUM  8.4 (L) 06/28/2019 0000   GFRNONAA 89 06/28/2019 0000   GFRAA 102 06/28/2019 0000    BNP    Component Value Date/Time   BNP 187.0 (H) 05/13/2019 1127     Imaging:  No results found.        Latest Ref Rng & Units 05/08/2015    2:15 PM  PFT Results  FVC-Pre L 2.12   FVC-Predicted Pre % 61   FVC-Post L 2.25   FVC-Predicted Post % 65   Pre FEV1/FVC % % 33   Post FEV1/FCV % % 42   FEV1-Pre L 0.69   FEV1-Predicted Pre % 25   FEV1-Post L 0.95   DLCO uncorrected ml/min/mmHg 19.65   DLCO UNC% % 80   DLCO corrected ml/min/mmHg 19.47   DLCO COR %Predicted % 80   DLVA Predicted % 84   TLC L 6.37   TLC % Predicted % 126   RV % Predicted % 218     No results found for: "NITRICOXIDE"   08/11/2022 Walking oximetry: No desaturations on room air; SpO2 low 92%. No dyspnea. Walked at an appropriate pace.   Assessment & Plan:   COPD, very severe (HCC) Very severe COPD with high symptom burden.  She also has symptoms of chronic bronchitis.  No recent exacerbations requiring steroids or antibiotics.  Last flare was after COVID in August 2023, almost a year ago.  She tells me that she has a lot of trouble at home.  She does feel like she has exacerbations but she does not like to use prednisone because she does not feel like it does much for her.  Has not had any hospitalizations.  We had a very lengthy discussion surrounding appropriate treatment options and why Dupixent would not be a preferred option for her right now.  She does not appear to have type II inflammation but we will obtain CBC with differential to assess eosinophil count. If future studies show dupixent is appropriate in assisting in the management of COPD without recurrent exacerbations, could revisit if eosinophils >300. I think she could receive benefit from maximizing her bronchodilator regimen, management of her chronic bronchitis, and conditioning regimen. Recommend trialing triple therapy nebulizers and holding Breztri to see if she has more of a perceived benefit from this.  We also discussed starting on Daliresp.  Medication side effect profile reviewed of all medications.  Will check CMET to ensure liver function is nl.  She was hesitant to participate in pulmonary rehab. Strongly encouraged her to consider it - she is willing to try. Referral placed today.  I am also concerned for PH with the severity of her lung disease and previous echo. She does not have significant volume overload, so I am not sure she would be a candidate for any additional medications. We discussed how this could contribute to her daily dyspnea. Possible she has a Group 2 component given her cardiac history. Will repeat echo to reassess.  Patient Instructions  We will switch your Breztri to nebulizer medications to see if you receive better benefit... Arformoterol 2 mL neb Twice daily  Budesonide 2 mL neb Twice daily. Brush tongue and rinse mouth after use. You can combine this with the arformoterol  Yupelri 3 mL neb once daily. Do not mix this with your other two nebs.   Continue Albuterol inhaler 2 puffs or 3 mL neb every 6 hours as needed for shortness of breath or wheezing. Notify if symptoms persist despite rescue inhaler/neb use.  Continue  singulair (montelukast) 1 tab At bedtime  Continue supplemental oxygen 2 lpm for goal >88-90%  Labs today  After we get your labs back, we will start Daliresp (roflumilast) if your liver function is stable. You will take 250 mcg daily for 4 weeks then increased to 500 mcg daily   Echocardiogram ordered to re-evaluate your heart function and pulmonary hypertension.  We will have you redo a sleep study given your symptoms of snoring and excessive daytime fatigue. Someone will contact you to schedule this.   Referral to pulmonary rehab  Follow up with Dr. Vassie Loll in 8 weeks to go over sleep study and echo results and see how new medications are working. If symptoms do not improve or worsen, please contact office for sooner follow up or seek emergency care.    Chronic respiratory failure with hypoxia (HCC) Stable without increased O2 requirement. She did well on her walk test today without  desaturations or visualized dyspnea on room air. Encouraged her to continue monitoring at home for goal >88-90%. She reached out to [facility] about replaced her concentrator. Awaiting a call back.   Chronic diastolic CHF (congestive heart failure) (HCC) Euvolemic on exam. See above plan.   Excessive daytime sleepiness She has snoring, excessive daytime sleepiness, nocturnal apneic events, orthopnea. BMI 32. Given this,  I am concerned she could have sleep disordered breathing with obstructive sleep apnea. She will need sleep study for further evaluation.    - discussed how weight can impact sleep and risk for sleep disordered breathing - discussed options to assist with weight loss: combination of diet modification, cardiovascular and strength training exercises   - had an extensive discussion regarding the adverse health consequences related to untreated sleep disordered breathing - specifically discussed the risks for hypertension, coronary artery disease, cardiac dysrhythmias, cerebrovascular disease, and diabetes - lifestyle modification discussed   - discussed how sleep disruption can increase risk of accidents, particularly when driving - safe driving practices were discussed   Depression with anxiety Large driving factor is the status of her lung disease. She is understandably frustrated by her current state. No SI/HI. See above plan. If no improvement in her mood, may want to consider SSRI therapy with her PCP.   I spent 55 minutes of dedicated to the care of this patient on the date of this encounter to include pre-visit review of records, face-to-face time with the patient discussing conditions above, post visit ordering of testing, clinical documentation with the electronic health record, making appropriate referrals as documented, and communicating necessary findings to members of the patients care team.  Noemi Chapel, NP 08/11/2022  Pt aware and understands NP's role.

## 2022-08-11 NOTE — Assessment & Plan Note (Signed)
Large driving factor is the status of her lung disease. She is understandably frustrated by her current state. No SI/HI. See above plan. If no improvement in her mood, may want to consider SSRI therapy with her PCP.

## 2022-08-12 ENCOUNTER — Other Ambulatory Visit: Payer: Self-pay | Admitting: Nurse Practitioner

## 2022-08-12 DIAGNOSIS — J4489 Other specified chronic obstructive pulmonary disease: Secondary | ICD-10-CM

## 2022-08-12 LAB — CBC WITH DIFFERENTIAL/PLATELET
Basophils Absolute: 0 10*3/uL (ref 0.0–0.2)
Basos: 0 %
EOS (ABSOLUTE): 0.1 10*3/uL (ref 0.0–0.4)
Eos: 2 %
Hematocrit: 44.3 % (ref 34.0–46.6)
Hemoglobin: 15.3 g/dL (ref 11.1–15.9)
Immature Grans (Abs): 0 10*3/uL (ref 0.0–0.1)
Immature Granulocytes: 0 %
Lymphocytes Absolute: 1.8 10*3/uL (ref 0.7–3.1)
Lymphs: 20 %
MCH: 29.9 pg (ref 26.6–33.0)
MCHC: 34.5 g/dL (ref 31.5–35.7)
MCV: 87 fL (ref 79–97)
Monocytes Absolute: 0.6 10*3/uL (ref 0.1–0.9)
Monocytes: 7 %
Neutrophils Absolute: 6.3 10*3/uL (ref 1.4–7.0)
Neutrophils: 71 %
Platelets: 283 10*3/uL (ref 150–450)
RBC: 5.12 x10E6/uL (ref 3.77–5.28)
RDW: 12.9 % (ref 11.7–15.4)
WBC: 8.9 10*3/uL (ref 3.4–10.8)

## 2022-08-12 LAB — COMPREHENSIVE METABOLIC PANEL
ALT: 17 IU/L (ref 0–32)
AST: 24 IU/L (ref 0–40)
Albumin/Globulin Ratio: 2.3
Albumin: 4.5 g/dL (ref 3.9–4.9)
Alkaline Phosphatase: 79 IU/L (ref 44–121)
BUN/Creatinine Ratio: 22 (ref 12–28)
BUN: 17 mg/dL (ref 8–27)
Bilirubin Total: 0.7 mg/dL (ref 0.0–1.2)
CO2: 26 mmol/L (ref 20–29)
Calcium: 9.7 mg/dL (ref 8.7–10.3)
Chloride: 97 mmol/L (ref 96–106)
Creatinine, Ser: 0.77 mg/dL (ref 0.57–1.00)
Globulin, Total: 2 g/dL (ref 1.5–4.5)
Glucose: 87 mg/dL (ref 70–99)
Potassium: 4.1 mmol/L (ref 3.5–5.2)
Sodium: 139 mmol/L (ref 134–144)
Total Protein: 6.5 g/dL (ref 6.0–8.5)
eGFR: 88 mL/min/{1.73_m2} (ref >59)

## 2022-08-12 MED ORDER — ROFLUMILAST 500 MCG PO TABS
ORAL_TABLET | ORAL | 5 refills | Status: DC
Start: 2022-08-12 — End: 2022-11-01

## 2022-08-15 ENCOUNTER — Telehealth: Payer: Self-pay

## 2022-08-15 ENCOUNTER — Other Ambulatory Visit (HOSPITAL_COMMUNITY): Payer: Self-pay

## 2022-08-15 NOTE — Telephone Encounter (Signed)
*  Pulm   PA request received for Yupelri 175MCG/3ML solution  PA submitted to [facility] and has been APPROVED from 08/15/2022-08/14/2023  Key: Alessandra [id number]

## 2022-08-18 ENCOUNTER — Other Ambulatory Visit: Payer: Self-pay | Admitting: Acute Care

## 2022-08-18 DIAGNOSIS — Z87891 Personal history of nicotine dependence: Secondary | ICD-10-CM

## 2022-08-18 DIAGNOSIS — Z122 Encounter for screening for malignant neoplasm of respiratory organs: Secondary | ICD-10-CM

## 2022-08-19 ENCOUNTER — Telehealth: Payer: Self-pay | Admitting: Nurse Practitioner

## 2022-08-19 DIAGNOSIS — J9611 Chronic respiratory failure with hypoxia: Secondary | ICD-10-CM

## 2022-08-19 DIAGNOSIS — J449 Chronic obstructive pulmonary disease, unspecified: Secondary | ICD-10-CM

## 2022-08-19 DIAGNOSIS — R0683 Snoring: Secondary | ICD-10-CM

## 2022-08-19 DIAGNOSIS — G4719 Other hypersomnia: Secondary | ICD-10-CM

## 2022-08-19 NOTE — Telephone Encounter (Signed)
[facility] denied this patient's home sleep study request due to her respiratory issues they prefer her sleep study to be completed as an attended sleep study.  Here are the policy requirements your request did not meet: Your nurse asked for a sleep study in the home. You are on oxygen. You had reduced lung function (FEV1 was 25%). This test will not be helpful. Your nurse may get the information you need with a in lab sleep study (sleep titration). For this reason, we cannot say yes to a home sleep study. You have the right to appeal.   Please advise is P2P would like to be completed or change to in lab

## 2022-08-25 NOTE — Telephone Encounter (Signed)
In lab split night ordered. Please let patient know. Thanks.

## 2022-08-29 ENCOUNTER — Telehealth (HOSPITAL_COMMUNITY): Payer: Self-pay | Admitting: *Deleted

## 2022-08-29 ENCOUNTER — Ambulatory Visit (HOSPITAL_COMMUNITY): Admission: RE | Admit: 2022-08-29 | Payer: Medicaid Other | Source: Ambulatory Visit

## 2022-08-29 ENCOUNTER — Ambulatory Visit (HOSPITAL_COMMUNITY)
Admission: RE | Admit: 2022-08-29 | Discharge: 2022-08-29 | Disposition: A | Discharge status: Home / Self Care | Payer: Medicaid Other | Source: Ambulatory Visit | Attending: Nurse Practitioner | Admitting: Nurse Practitioner

## 2022-08-29 DIAGNOSIS — J449 Chronic obstructive pulmonary disease, unspecified: Secondary | ICD-10-CM | POA: Diagnosis not present

## 2022-08-29 DIAGNOSIS — I340 Nonrheumatic mitral (valve) insufficiency: Secondary | ICD-10-CM | POA: Diagnosis not present

## 2022-08-29 DIAGNOSIS — I272 Pulmonary hypertension, unspecified: Secondary | ICD-10-CM | POA: Diagnosis present

## 2022-08-29 DIAGNOSIS — Z95828 Presence of other vascular implants and grafts: Secondary | ICD-10-CM | POA: Diagnosis not present

## 2022-08-29 DIAGNOSIS — I2510 Atherosclerotic heart disease of native coronary artery without angina pectoris: Secondary | ICD-10-CM | POA: Insufficient documentation

## 2022-08-29 DIAGNOSIS — I509 Heart failure, unspecified: Secondary | ICD-10-CM | POA: Diagnosis not present

## 2022-08-29 DIAGNOSIS — R0609 Other forms of dyspnea: Secondary | ICD-10-CM | POA: Insufficient documentation

## 2022-08-29 DIAGNOSIS — Z952 Presence of prosthetic heart valve: Secondary | ICD-10-CM | POA: Diagnosis not present

## 2022-08-29 LAB — ECHOCARDIOGRAM COMPLETE
AR max vel: 1.53 cm2
AV Area VTI: 1.61 cm2
AV Area mean vel: 1.39 cm2
AV Mean grad: 13 mmHg
AV Peak grad: 24.4 mmHg
Ao pk vel: 2.47 m/s
Area-P 1/2: 3.6 cm2
Calc EF: 57.9 %
S' Lateral: 2.8 cm
Single Plane A2C EF: 63.5 %
Single Plane A4C EF: 52.6 %

## 2022-08-29 NOTE — Progress Notes (Signed)
Echocardiogram 2D Echocardiogram has been performed.  Augustine Radar 08/29/2022, 4:21 PM

## 2022-08-29 NOTE — Telephone Encounter (Signed)
Cardiac Rehab Medication Review by a Nurse   Does the patient  feel that his/her medications are working for him/her?  yes   Has the patient been experiencing any side effects to the medications prescribed?  no   Does the patient measure his/her own blood pressure or blood glucose at home?  no    Does the patient have any problems obtaining medications due to transportation or finances?   no   Understanding of regimen: good Understanding of indications: good Potential of compliance: good     Verified medication via phone with patient. Nurse comments:    review

## 2022-08-30 ENCOUNTER — Encounter (HOSPITAL_COMMUNITY): Payer: Medicaid Other

## 2022-08-31 ENCOUNTER — Other Ambulatory Visit: Payer: Self-pay | Admitting: Cardiovascular Disease

## 2022-08-31 DIAGNOSIS — I4892 Unspecified atrial flutter: Secondary | ICD-10-CM

## 2022-08-31 NOTE — Telephone Encounter (Signed)
Spoke to clinical reviewer, was able to reason for a HST. She is going to update the codes to HST codes for my most recent auth request and send over approval via fax & provider portal ASAP.    Will call pt to sch HST once received.

## 2022-09-05 ENCOUNTER — Telehealth: Payer: Self-pay | Admitting: *Deleted

## 2022-09-05 NOTE — Telephone Encounter (Signed)
Patient states that all new nebulizer medications is making her "sick as a dog" and she would like to discuss what is going on. Please call and advise patient [phone]

## 2022-09-07 NOTE — Telephone Encounter (Signed)
Lindsay Milch, MD  to Me     09/07/22 11:19 AM  Spoke to patient I explained to her that it is more likely that side effects of nausea and vomiting are due to Daliresp.  She will stop taking Daliresp, has already taken it this morning. We also discussed spacing out bronchodilator medications, these are helping with her breathing so I would like her to continue this for 2 to 3 days to see if symptoms subside after stopping dialysis. She will MyChart message me back. We also discussed indication for Dupixent and COPD We will continue discussion during her office visit in September

## 2022-09-07 NOTE — Telephone Encounter (Signed)
Spoke with the pt  She is c/o HA, diarrhea and vomiting past 2 wks- ever since she started triple neb therapy with aformotorol, budesonide and yupelri   She states she has tried taking meds every day and has had these symptoms  She was last seen by Florentina Addison but asked that this msg go to Dr. Vassie Loll   Please advise, thanks!

## 2022-09-13 ENCOUNTER — Ambulatory Visit: Payer: Medicaid Other | Attending: Cardiovascular Disease | Admitting: *Deleted

## 2022-09-13 DIAGNOSIS — Z5181 Encounter for therapeutic drug level monitoring: Secondary | ICD-10-CM | POA: Diagnosis not present

## 2022-09-13 DIAGNOSIS — I4892 Unspecified atrial flutter: Secondary | ICD-10-CM

## 2022-09-13 LAB — POCT INR: INR: 2.9 (ref 2.0–3.0)

## 2022-09-13 NOTE — Patient Instructions (Signed)
Continue warfarin 1 tablet daily except for 1/2 a tablet on Thursdays Recheck in 6 wks Call [phone] for questions or concerns.  

## 2022-09-15 ENCOUNTER — Encounter (HOSPITAL_COMMUNITY): Payer: Self-pay

## 2022-09-19 ENCOUNTER — Encounter (HOSPITAL_COMMUNITY): Payer: Medicaid Other

## 2022-09-30 ENCOUNTER — Other Ambulatory Visit: Payer: Self-pay | Admitting: Cardiovascular Disease

## 2022-09-30 DIAGNOSIS — I4892 Unspecified atrial flutter: Secondary | ICD-10-CM

## 2022-10-17 ENCOUNTER — Other Ambulatory Visit: Payer: Self-pay | Admitting: Pulmonary Disease

## 2022-10-17 MED ORDER — ALBUTEROL SULFATE HFA 108 (90 BASE) MCG/ACT IN AERS: 2.0000 | INHALATION_SPRAY | Freq: Four times a day (QID) | RESPIRATORY_TRACT | 2 refills | Status: DC | PRN

## 2022-10-20 ENCOUNTER — Ambulatory Visit: Payer: Medicaid Other

## 2022-10-20 DIAGNOSIS — G4733 Obstructive sleep apnea (adult) (pediatric): Secondary | ICD-10-CM

## 2022-10-20 DIAGNOSIS — G4719 Other hypersomnia: Secondary | ICD-10-CM

## 2022-10-20 DIAGNOSIS — R0683 Snoring: Secondary | ICD-10-CM

## 2022-10-25 ENCOUNTER — Ambulatory Visit: Payer: Medicaid Other | Attending: Cardiovascular Disease | Admitting: *Deleted

## 2022-10-25 DIAGNOSIS — I4892 Unspecified atrial flutter: Secondary | ICD-10-CM | POA: Diagnosis not present

## 2022-10-25 DIAGNOSIS — Z5181 Encounter for therapeutic drug level monitoring: Secondary | ICD-10-CM

## 2022-10-25 LAB — POCT INR: INR: 3.2 — AB (ref 2.0–3.0)

## 2022-10-25 NOTE — Patient Instructions (Signed)
Hold warfarin tonight then resume 1 tablet daily except for 1/2 a tablet on Thursdays Recheck in 6 wks Call [phone] for questions or concerns.

## 2022-10-26 ENCOUNTER — Encounter: Payer: Self-pay | Admitting: Pulmonary Disease

## 2022-10-28 DIAGNOSIS — G4733 Obstructive sleep apnea (adult) (pediatric): Secondary | ICD-10-CM | POA: Diagnosis not present

## 2022-10-28 MED ORDER — BREZTRI AEROSPHERE 160-9-4.8 MCG/ACT IN AERO: 2.0000 | INHALATION_SPRAY | Freq: Two times a day (BID) | RESPIRATORY_TRACT | 3 refills | Status: DC

## 2022-10-28 NOTE — Telephone Encounter (Signed)
Rx sent to pharmacy   

## 2022-11-01 ENCOUNTER — Ambulatory Visit: Payer: Medicaid Other | Admitting: Pulmonary Disease

## 2022-11-01 ENCOUNTER — Other Ambulatory Visit: Payer: Self-pay | Admitting: Pulmonary Disease

## 2022-11-01 ENCOUNTER — Encounter: Payer: Self-pay | Admitting: Pulmonary Disease

## 2022-11-01 ENCOUNTER — Other Ambulatory Visit: Payer: Self-pay | Admitting: Cardiovascular Disease

## 2022-11-01 VITALS — BP 94/62 | HR 85 | Ht 63.0 in | Wt 184.0 lb

## 2022-11-01 DIAGNOSIS — J3081 Allergic rhinitis due to animal (cat) (dog) hair and dander: Secondary | ICD-10-CM | POA: Insufficient documentation

## 2022-11-01 DIAGNOSIS — G4733 Obstructive sleep apnea (adult) (pediatric): Secondary | ICD-10-CM

## 2022-11-01 DIAGNOSIS — J449 Chronic obstructive pulmonary disease, unspecified: Secondary | ICD-10-CM

## 2022-11-01 DIAGNOSIS — I4892 Unspecified atrial flutter: Secondary | ICD-10-CM

## 2022-11-01 DIAGNOSIS — J301 Allergic rhinitis due to pollen: Secondary | ICD-10-CM | POA: Insufficient documentation

## 2022-11-01 DIAGNOSIS — H6990 Unspecified Eustachian tube disorder, unspecified ear: Secondary | ICD-10-CM | POA: Insufficient documentation

## 2022-11-01 DIAGNOSIS — F419 Anxiety disorder, unspecified: Secondary | ICD-10-CM | POA: Insufficient documentation

## 2022-11-01 MED ORDER — OHTUVAYRE 3 MG/2.5ML IN SUSP: 2.5000 mL | Freq: Two times a day (BID) | RESPIRATORY_TRACT | 0 refills | Status: DC

## 2022-11-01 NOTE — Progress Notes (Signed)
   Subjective:    Patient ID: DALIAH AIKIN, female    DOB: 15-May-1960, 62 y.o.   MRN: [id number]  HPI  62 yo ex-smoker for FU of COPD and chronic hypoxic respiratory failure on 2 L of oxygen -quit smoking 2017, 1-2 PPD, >50 PYrs -last flare /ED visit 10/2020 -f/h/o COPD including a nephew @ 75 years old    PMH - s/p Aortic Valve Replacement/s/p Bentall procedure - Dr. Laneta Simmers on 05/2015 St. Jude Mechanical valve , original AVR in 1998 - sinus node dysfunction on Cardizem - HFpEF- on Lasix  -01/2020 covid infection -chronic back pain on percocet  45-month follow-up visit She was seen by APP 07/2022 -due to persistent high symptom burden, she was switched to nebulizer combination of budesonide/Brovana and Yupelri.  Roflumilast was added -she developed severe nausea and vomiting and headache and we had a phone conversation 08/2022.  This subsided after stopping Roflumilast. Repeat echo was obtained which showed normal RVSP , normal mechanical valve function Sleep study was ordered which I reviewed today  She continues to complain of shortness of breath and chest congestion.  She has now switched back to [place] medications.  She takes Lasix only "when I needed" this causes cramping  Significant tests/ events reviewed   PFTs 04/2015 >>ratio 33, FEV1 0.69 /25% , improved to 0.95/35% with BD, FVC 61%, TLC 126 %, DLCO 80   06/2021 Alpha 1 137, MM LDCT chest 10/2021>> RLL 4mm nodule, stable   CT chest 07/2018 Coronary artery calcifications. Status post aortic valve and root graft repair, with probable reconstruction of the pulmonary outflow tract . Severe emphysema. Mild, diffuse bilateral bronchial wall thickening.   Echo 04/2019 nml LV fn, RVSP 37   HST 09/2022 >> mild OSA AHI 7/hour, low sat 84%  03/2017 NPSG >> no OSA, min desatn 77%  Review of Systems neg for any significant sore throat, dysphagia, itching, sneezing, nasal congestion or excess/ purulent secretions, fever, chills, sweats,  unintended wt loss, pleuritic or exertional cp, hempoptysis, orthopnea pnd or change in chronic leg swelling. Also denies presyncope, palpitations, heartburn, abdominal pain, nausea, vomiting, diarrhea or change in bowel or urinary habits, dysuria,hematuria, rash, arthralgias, visual complaints, headache, numbness weakness or ataxia.     Objective:   Physical Exam  Gen. Pleasant, well-nourished, in no distress ENT - no thrush, no pallor/icterus,no post nasal drip Neck: No JVD, no thyromegaly, no carotid bruits Lungs: no use of accessory muscles, no dullness to percussion, clear without rales or rhonchi  Cardiovascular: Rhythm regular, mechanical S2, no murmurs or gallops, no peripheral edema Musculoskeletal: No deformities, no cyanosis or clubbing        Assessment & Plan:

## 2022-11-01 NOTE — Patient Instructions (Addendum)
X Trial of Ohtuvayre - new COPD medication - by neb twice daily  Continue on Breztri  Sleep study showed mild sleep apnea -AHI 6/h

## 2022-11-01 NOTE — Progress Notes (Signed)
Chief Complaint  Patient presents with   Follow-up    CAD   History of Present Illness: 62 yo female with history of CAD, bicuspid aortic valve s/p Ross procedure in 1998 with thoracic aortic aneurysm and then Bentall procedure in April 2017, severe COPD, GERD, anxiety, chronic diastolic CHF,  tobacco abuse, PVCs, PACs here today for cardiac follow up. She has been followed for aortic valve insufficiency and thoracic aortic aneurysm and underwent Bentall procedure per Dr. Laneta Simmers on 06/15/15.  Cardiac cath March 2017 with mild CAD (20% RCA stenosis). Cardiac monitor June 2017 with PACs, PVCs. She has chronic diastolic CHF and is on Lasix. Normal ABI December 2018. Venous dopplers August 2019 with no evidence of DVT. Volume overload when seen in our office in October 2018. Metolazone added 3 times per week to her Lasix and her volume overload improved. Echo December 2019 with LVEF=60-65%, mild MR. She did not tolerate Cardizem due to sinus node dysfunction and was seen in EP clinic. Her cardizem was stopped. Echo March 2021 with LVEF=55-60%, trivial MR, well functioning AVR. She was seen in March 2021 in the EP clinic by Dr. Ladona Ridgel and he reassured her that her heart rate was truly not in the 40s as she was having PVCs. She was last seen in EP clinic 05/14/20 and no changes were made. She is followed in the pulmonary office for severe COPD and is now on supplemental O2 at night and sometimes during the day. Echo July 2024 with LVEF=65-70%. Mild mitral regurgitation. Normally functioning mechanical AVR.   She is here today for follow up. The patient denies any chest pain, palpitations, lower extremity edema, orthopnea, PND, dizziness, near syncope or syncope. Baseline dyspnea without change.    Primary Care Physician: Catalina Lunger, DO  Past Medical History:  Diagnosis Date   Anxiety    Aortic aneurysm [facility])    Aortic insufficiency    a. s/p Bentall with mechanical AVR 4/17 >> FU Echo 6/17: Mild  LVH, EF 60-65%, normal wall motion, ventricular septum with paradoxical septal motion, St. Jude mechanical AVR functioning  normally with no regurgitation (mean 6 mmHg), MAC, trivial MR, mild RVE, trivial TR   Aortic stenosis, severe    Bicuspid valve   Arthritis    Asthma    Atrial flutter (HCC)    Bitten or stung by nonvenomous insect and other nonvenomous arthropods, initial encounter    Bronchitis    Chronic diastolic CHF (congestive heart failure) (HCC)    Chronic respiratory failure with hypoxia (HCC)    COPD (chronic obstructive pulmonary disease) (HCC)    DDD (degenerative disc disease), cervical    Depression    Endocarditis, valve unspecified    GERD (gastroesophageal reflux disease)    Hard of hearing    Heart failure (HCC)    Heart failure, unspecified (HCC)    Hemorrhage, not elsewhere classified    History of pneumonia    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   Hyperkalemia    Low back pain    Migraine headache    Mild CAD    Other chronic pain    Palpitations    PAT (paroxysmal atrial tachycardia)    PONV (postoperative nausea and vomiting)    "only once"   Premature atrial contractions    PVC's (premature ventricular contractions)    Shortness of breath    Nodule left lung CT done 8/6   Sinus bradycardia    Stress incontinence  Unspecified osteoarthritis, unspecified site    Valvular heart disease     Past Surgical History:  Procedure Laterality Date   ABDOMINAL HYSTERECTOMY     ANTERIOR CERVICAL DECOMP/DISCECTOMY FUSION  10/05/2011   Procedure: ANTERIOR CERVICAL DECOMPRESSION/DISCECTOMY FUSION 2 LEVELS;  Surgeon: Karn Cassis, MD;  Location: [facility]UR[facility] OR[facility];  Service: Neurosurgery;  Laterality: N/A;  Cervical five - six , six - seven Anterior cervical decompression/diskectomy/fusion/plate   ANTERIOR CRUCIATE LIGAMENT REPAIR Right    AORTIC VALVE REPLACEMENT     ARTHROSCOPIC REPAIR ACL  rt   BENTALL PROCEDURE N/A 06/15/2015    Procedure: BENTALL PROCEDURE; HEMI-ARCH REPAIR WITH #23 ST JUDE MECHANICAL AVR CONDUIT AND #28 HEMASHIELD PLATINUM GRAFT;  Surgeon: Alleen Borne, MD;  Location: [facility] OR;  Service: Open Heart Surgery;  Laterality: N/A;   CARDIAC CATHETERIZATION N/A 05/21/2015   Procedure: Right/Left Heart Cath and Coronary Angiography;  Surgeon: Kathleene Hazel, MD;  Location: [facility] INVASIVE CV LAB;  Service: Cardiovascular;  Laterality: N/A;   CHOLECYSTECTOMY     COLONOSCOPY  10/13/05   COLONOSCOPY WITH PROPOFOL N/A 07/22/2021   Procedure: COLONOSCOPY WITH PROPOFOL;  Surgeon: Imogene Burn, MD;  Location: [facility] ENDOSCOPY;  Service: Gastroenterology;  Laterality: N/A;   DIAGNOSTIC LAPAROSCOPY     HEMOSTASIS CLIP PLACEMENT  07/22/2021   Procedure: HEMOSTASIS CLIP PLACEMENT;  Surgeon: Imogene Burn, MD;  Location: [facility] ENDOSCOPY;  Service: Gastroenterology;;   JOINT REPLACEMENT Right 2016   KNEE ARTHROSCOPY     rt x4  x1 lft   MYRINGOPLASTY W/ FAT GRAFT     graft from behind ear   POLYPECTOMY  07/22/2021   Procedure: POLYPECTOMY;  Surgeon: Imogene Burn, MD;  Location: [facility];  Service: Gastroenterology;;   Daiva Huge  10/13/05, 09/19/05   TEE WITHOUT CARDIOVERSION N/A 06/15/2015   Procedure: TRANSESOPHAGEAL ECHOCARDIOGRAM (TEE);  Surgeon: Alleen Borne, MD;  Location: [facility];  Service: Open Heart Surgery;  Laterality: N/A;   TONSILLECTOMY  2005   TUBAL LIGATION     TYMPANOSTOMY TUBE PLACEMENT      Current Outpatient Medications  Medication Sig Dispense Refill   albuterol (PROVENTIL) (2.5 MG/3ML) 0.083% nebulizer solution ONE VIAL BY NEBULIZATION EVERY 4 HOURS AS NEEDED FOR WHEEZING OR SHORTNESS OF BREATH. 9 mL 0   albuterol (VENTOLIN HFA) 108 (90 Base) MCG/ACT inhaler Inhale 2 puffs into the lungs every 6 (six) hours as needed for wheezing or shortness of breath. 18 g 2   Ascorbic Acid (VITAMIN C ER PO) Take 2,000 mg by mouth daily.     aspirin EC 81 MG tablet Take 1 tablet (81 mg total) by mouth  daily.     Budeson-Glycopyrrol-Formoterol (BREZTRI AEROSPHERE) 160-9-4.8 MCG/ACT AERO Inhale 2 puffs into the lungs in the morning and at bedtime. 10.7 g 3   chlorpheniramine (CHLOR-TRIMETON) 4 MG tablet Take 4 mg by mouth See admin instructions. Take 4 mg in the morning and 8 mg at bedtime     Cholecalciferol (VITAMIN D) 125 MCG (5000 UT) CAPS Take 5,000 Units by mouth daily.     gabapentin (NEURONTIN) 600 MG tablet Take 600 mg by mouth 2 (two) times daily.     linaclotide (LINZESS) 145 MCG CAPS capsule Take 145 mcg by mouth every morning.     losartan (COZAAR) 25 MG tablet TAKE 1 TABLET ONCE DAILY. 30 tablet 4   magnesium oxide (MAG-OX) 400 MG tablet Take 1 tablet (400 mg total) by mouth daily. 90 tablet 3   metolazone (ZAROXOLYN)  2.5 MG tablet take 1 tablet by mouth on monday, wednesday and friday. 12 tablet 0   montelukast (SINGULAIR) 10 MG tablet Take 1 tablet (10 mg total) by mouth at bedtime. 30 tablet 3   omeprazole (PRILOSEC) 40 MG capsule Take 40 mg by mouth daily.     Oxycodone HCl 10 MG TABS      pravastatin (PRAVACHOL) 40 MG tablet TAKE 1 TABLET EVERY EVENING. 30 tablet 4   traZODone (DESYREL) 150 MG tablet Take 150 mg by mouth at bedtime.     warfarin (COUMADIN) 5 MG tablet Take ONE-HALF (1/2) tablet to 1 tablet by mouth daily or as directed by the coumadin clinic. 30 tablet 0   amoxicillin (AMOXIL) 500 MG capsule TAKE 4 TABLETS BY MOUTH 30-60 MINUTES PRIOR TO DENTAL PROCEDURES (Patient not taking: Reported on 08/29/2022) 4 capsule 3   Ensifentrine (OHTUVAYRE) 3 MG/2.5ML SUSP Inhale 2.5 mLs into the lungs 2 (two) times daily. (Patient not taking: Reported on 11/02/2022) 150 mL 0   EPINEPHrine (EPIPEN) 0.3 mg/0.3 mL IJ SOAJ injection Inject 0.3 mg into the muscle as needed for anaphylaxis. For allergic reaction     furosemide (LASIX) 40 MG tablet Take 1 tablet (40 mg total) by mouth 2 (two) times daily. 180 tablet 3   naloxone (NARCAN) nasal spray 4 mg/0.1 mL Place 1 spray into the nose  once.     oxyCODONE-acetaminophen (PERCOCET) 10-325 MG tablet Take 1 tablet by mouth 4 (four) times daily as needed for pain. (Patient not taking: Reported on 11/02/2022)     potassium chloride (KLOR-CON M) 10 MEQ tablet Take 4 tablets (40 mEq total) by mouth 2 (two) times daily. 620 tablet 3   No current facility-administered medications for this visit.    Allergies  Allergen Reactions   Alpha-Gal     Red meat   Beta Adrenergic Blockers Anaphylaxis   Doxycycline Other (See Comments)    "Swelling, dizziness, sleepy, talking out of my head, almost caused liver failure"   Peanut-Containing Drug Products Anaphylaxis, Shortness Of Breath, Swelling and Other (See Comments)    Swelling of the throat   Vancomycin Swelling    Rash and severe swelling   Avelox [Moxifloxacin Hcl In Nacl] Rash   Ciprofloxacin Nausea And Vomiting   Cleocin [Clindamycin Hcl] Rash   Moxifloxacin Rash   Sulfamethoxazole-Trimethoprim Rash   Tape Other (See Comments)    White clear itches, rash ONLY use paper tape    Social History   Socioeconomic History   Marital status: Divorced    Spouse name: Not on file   Number of children: 3   Years of education: Not on file   Highest education level: Not on file  Occupational History   Occupation: Disabled  Tobacco Use   Smoking status: Former    Current packs/day: 0.00    Average packs/day: 0.1 packs/day for 30.0 years (3.0 ttl pk-yrs)    Types: Cigarettes    Start date: 04/28/1985    Quit date: 04/29/2015    Years since quitting: 7.5   Smokeless tobacco: Never  Vaping Use   Vaping status: Never Used  Substance and Sexual Activity   Alcohol use: No    Alcohol/week: 0.0 standard drinks of alcohol   Drug use: No   Sexual activity: Not Currently  Other Topics Concern   Not on file  Social History Narrative   Not on file   Social Determinants of Health   Financial Resource Strain: Low Risk  (10/05/2021)  Received from [facility], [facility]    Overall Financial Resource Strain (CARDIA)    Difficulty of Paying Living Expenses: Not hard at all  Food Insecurity: No Food Insecurity (10/05/2021)   Received from [facility], [facility]   Hunger Vital Sign    Worried About Running Out of Food in the Last Year: Never true    Ran Out of Food in the Last Year: Never true  Transportation Needs: No Transportation Needs (10/05/2021)   Received from [facility], [facility]   [facility] - Transportation    Lack of Transportation (Medical): No    Lack of Transportation (Non-Medical): No  Physical Activity: Inactive (10/05/2021)   Received from [facility], [facility]   Exercise Vital Sign    Days of Exercise per Week: 0 days    Minutes of Exercise per Session: 0 min  Stress: No Stress Concern Present (10/05/2021)   Received from [facility], [facility] - Occupational Stress Questionnaire    Feeling of Stress : Not at all  Social Connections: Unknown (10/05/2021)   Received from [facility], [facility]   Social Connection and Isolation Panel [NHANES]    Frequency of Communication with Friends and Family: Twice a week    Frequency of Social Gatherings with Friends and Family: Twice a week    Attends Religious Services: 1 to 4 times per year    Active Member of [employer] or Organizations: No    Attends Engineer, structural: 1 to 4 times per year    Marital Status: Not on file  Intimate Partner Violence: Not At Risk (05/10/2022)   Received from [facility], [facility]   Humiliation, Afraid, Rape, and Kick questionnaire    Fear of Current or Ex-Partner: No    Emotionally Abused: No    Physically Abused: No    Sexually Abused: No    Family History  Problem Relation Age of Onset   Cancer Mother        Type unknown-? lung   Heart attack Father    Asthma Brother    Colon cancer Brother        dx at age 28   Esophageal cancer Maternal Grandfather         smoker   Diabetes Other        family history   Hyperlipidemia Other        family history    Review of Systems:  As stated in the HPI and otherwise negative.   BP 124/80   Pulse 77   Ht 5\' 3"  (1.6 m)   Wt 83.9 kg   BMI 32.77 kg/m   Physical Examination: General: Well developed, well nourished, NAD  HEENT: OP clear, mucus membranes moist  SKIN: warm, dry. No rashes. Neuro: No focal deficits  Musculoskeletal: Muscle strength 5/5 all ext  Psychiatric: Mood and affect normal  Neck: No JVD, no carotid bruits, no thyromegaly, no lymphadenopathy.  Lungs:Clear bilaterally with poor air movement, no wheezes, rhonci, crackles Cardiovascular: Regular rate and rhythm. Mechanical valve click with soft systolic murmur.  Abdomen:Soft. Bowel sounds present. Non-tender.  Extremities: No lower extremity edema. Pulses are 2 + in the bilateral DP/PT.  Echo July 2024: 1. Left ventricular ejection fraction, by estimation, is 65 to 70%. The  left ventricle has normal function. The left ventricle has no regional  wall motion abnormalities.  Left ventricular diastolic parameters were  normal.   2. Right ventricular systolic function is normal. The right ventricular  size is normal. There is normal pulmonary artery systolic pressure.   3. Right atrial size was mildly dilated.   4. Mild mitral valve regurgitation.   5. S/p AVR (23 mm St Jude mechanical valve, procedure dated 05/2015) Peak  and mean gradients through the valve are 23 and 12 mm Hg AVA (VTI) 1.64  cm2. COmpared to echo from March 2021, mild increase in mean gradient (9  to 12 mm). The aortic valve has  been repaired/replaced. Aortic valve regurgitation is not visualized.  There is a St. Jude mechanical valve present in the aortic position.  Procedure Date: 05/2015.   6. S/p Hemashield platinum graft. AAortic root, ascending aorta normal in  size.Marland Kitchen Aortic root/ascending aorta has been repaired/replaced.   7. The inferior vena  cava is normal in size with greater than 50%  respiratory variability, suggesting right atrial pressure of 3 mmHg.    EKG:  EKG is ordered today. The ekg ordered today demonstrates EKG Interpretation Date/Time:  Wednesday November 02 2022 11:13:29 EDT Ventricular Rate:  77 PR Interval:  136 QRS Duration:  90 QT Interval:  382 QTC Calculation: 432 R Axis:   92  Text Interpretation: Normal sinus rhythm Nonspecific ST abnormality Confirmed by Verne Carrow [phone]) on 11/02/2022 11:20:28 AM    Recent Labs: 08/11/2022: ALT 17; BUN 17; Creatinine, Ser 0.77; Hemoglobin 15.3; Platelets 283; Potassium 4.1; Sodium 139   Lipid Panel    Component Value Date/Time   CHOL 171 06/28/2019 0000   TRIG 205 (H) 06/28/2019 0000   HDL 50 06/28/2019 0000   CHOLHDL 3.4 06/28/2019 0000   CHOLHDL 2.7 10/22/2015 0933   VLDL 19 10/22/2015 0933   LDLCALC 87 06/28/2019 0000   LDLDIRECT 79.0 03/25/2014 0947     Wt Readings from Last 3 Encounters:  11/02/22 83.9 kg  11/01/22 83.5 kg  08/11/22 83.1 kg    Assessment and Plan:   1. Aortic valve disease: She is s/p mechanical AVR in 2017. Working well by echo July 2024. Continue coumadin and SBE prophylaxis when indicated.   2. Thoracic aortic aneurysm: She is s/p Bentall procedure 2017.  3. Tobacco abuse, in remission: She has stopped smoking.   4. Palpitations/PVCs/PACs:  She did not tolerate Cardizem due to sinus node dysfunction. Cardiac monitor in June 2021 with sinus with no bradycardia but with known PVCs, PACs and several short runs of SVT and VT. She has not required AV nodal blocking agents.      5. HLD: Lipids followed in primary care but no recent checks. Will check lipids and LFTs today. Continue statin  6. Chronic diastolic CHF: Weight is stable. No volume overload on exam. Continue Lasix 80 mg po BID and metolazone 2.5 mg MWF.   7. CAD: Mild RCA plaque by cath 2017. No chest pain. Continue ASA and statin.   8. HTN: BP is well  controlled. No changes today  Labs/ tests ordered today include:   Orders Placed This Encounter  Procedures   Lipid panel   Hepatic function panel   EKG 12-Lead   Disposition:   F/U with me in 12 months  Signed, Verne Carrow, MD 11/02/2022 11:54 AM    [facility] [address] [place], [place], [place]  [address] Phone: [phone]; Fax: [phone]

## 2022-11-01 NOTE — Assessment & Plan Note (Signed)
She continues to have a very high symptom burden in spite of triple therapy.  We have tried nebulized budesonide/Brovana/Yupelri combination without relief. We will trial PDE 3/4 inhibitor -Ensifentrine -twice daily neb I again offered her pulmonary rehab  We discussed action plan for COPD and signs and symptoms of exacerbation

## 2022-11-01 NOTE — Assessment & Plan Note (Signed)
Very mild Weight loss and positional therapyadvised

## 2022-11-02 ENCOUNTER — Ambulatory Visit: Payer: Medicaid Other | Attending: Cardiovascular Disease | Admitting: Cardiovascular Disease

## 2022-11-02 ENCOUNTER — Encounter: Payer: Self-pay | Admitting: Cardiovascular Disease

## 2022-11-02 VITALS — BP 124/80 | HR 77 | Ht 63.0 in | Wt 185.0 lb

## 2022-11-02 DIAGNOSIS — I493 Ventricular premature depolarization: Secondary | ICD-10-CM

## 2022-11-02 DIAGNOSIS — I712 Thoracic aortic aneurysm, without rupture, unspecified: Secondary | ICD-10-CM

## 2022-11-02 DIAGNOSIS — I359 Nonrheumatic aortic valve disorder, unspecified: Secondary | ICD-10-CM | POA: Diagnosis not present

## 2022-11-02 DIAGNOSIS — I5032 Chronic diastolic (congestive) heart failure: Secondary | ICD-10-CM | POA: Diagnosis not present

## 2022-11-02 DIAGNOSIS — I10 Essential (primary) hypertension: Secondary | ICD-10-CM | POA: Diagnosis not present

## 2022-11-02 DIAGNOSIS — I2510 Atherosclerotic heart disease of native coronary artery without angina pectoris: Secondary | ICD-10-CM

## 2022-11-02 MED ORDER — FUROSEMIDE 40 MG PO TABS: 40.0000 mg | ORAL_TABLET | Freq: Two times a day (BID) | ORAL | 3 refills | Status: DC

## 2022-11-02 MED ORDER — POTASSIUM CHLORIDE CRYS ER 10 MEQ PO TBCR: 40.0000 meq | EXTENDED_RELEASE_TABLET | Freq: Two times a day (BID) | ORAL | 3 refills | Status: DC

## 2022-11-02 NOTE — Patient Instructions (Addendum)
Medication Instructions:  No changes--CONTINUE LASIX 40 mg twice daily and POTASSIUM 40 meq twice daily --refills sent w updated directions/quantity *If you need a refill on your cardiac medications before your next appointment, please call your pharmacy*   Lab Work: Today: lipids/liver function If you have labs (blood work) drawn today and your tests are completely normal, you will receive your results only by: MyChart Message (if you have MyChart) OR A paper copy in the mail If you have any lab test that is abnormal or we need to change your treatment, we will call you to review the results.   Testing/Procedures: none   Follow-Up: At [facility], you and your health needs are our priority.  As part of our continuing mission to provide you with exceptional heart care, we have created designated Provider Care Teams.  These Care Teams include your primary Cardiologist (physician) and Advanced Practice Providers (APPs -  Physician Assistants and Nurse Practitioners) who all work together to provide you with the care you need, when you need it.    Your next appointment:   12 month(s)  Provider:   Verne Carrow, MD

## 2022-11-03 ENCOUNTER — Other Ambulatory Visit (HOSPITAL_BASED_OUTPATIENT_CLINIC_OR_DEPARTMENT_OTHER): Payer: Self-pay | Admitting: Pulmonary Disease

## 2022-11-03 LAB — LIPID PANEL
Chol/HDL Ratio: 3 ratio (ref 0.0–4.4)
Cholesterol, Total: 209 mg/dL — ABNORMAL HIGH (ref 100–199)
HDL: 70 mg/dL (ref >39)
LDL Chol Calc (NIH): 122 mg/dL — ABNORMAL HIGH (ref 0–99)
Triglycerides: 98 mg/dL (ref 0–149)
VLDL Cholesterol Cal: 17 mg/dL (ref 5–40)

## 2022-11-03 LAB — HEPATIC FUNCTION PANEL
ALT: 18 IU/L (ref 0–32)
AST: 23 IU/L (ref 0–40)
Albumin: 4.4 g/dL (ref 3.9–4.9)
Alkaline Phosphatase: 76 IU/L (ref 44–121)
Bilirubin Total: 0.6 mg/dL (ref 0.0–1.2)
Bilirubin, Direct: 0.18 mg/dL (ref 0.00–0.40)
Total Protein: 6.2 g/dL (ref 6.0–8.5)

## 2022-11-14 ENCOUNTER — Telehealth: Payer: Self-pay | Admitting: *Deleted

## 2022-11-14 DIAGNOSIS — I2510 Atherosclerotic heart disease of native coronary artery without angina pectoris: Secondary | ICD-10-CM

## 2022-11-14 DIAGNOSIS — E782 Mixed hyperlipidemia: Secondary | ICD-10-CM

## 2022-11-14 MED ORDER — ROSUVASTATIN CALCIUM 20 MG PO TABS: 20.0000 mg | ORAL_TABLET | Freq: Every day | ORAL | 3 refills | Status: AC

## 2022-11-14 NOTE — Telephone Encounter (Signed)
-----   Message from Verne Carrow sent at 11/03/2022  9:20 AM EDT ----- Her LDL is not near goal on Pravastatin 40 mg daily. Can we check with her and see if she has tried Crestor in the past? If she has not, I would recommend stopping the Pravastatin and starting Crestor 20 mg daily. If she has failed other statins, would refer to lipid clinic. cdm

## 2022-11-14 NOTE — Telephone Encounter (Signed)
Reviewed chart w patient. She was on Crestor way back and could not recall why it was stopped.  Found note in chart where she had to have tried and failed 2 other statins before Crestor would be covered.  This was back in 2015.  She is willing to stop pravastatin and start Crestor 20 mg daily.  Scheduled lipids and liver in 12 weeks.  Pt appreciative for assistance.

## 2022-11-15 ENCOUNTER — Telehealth: Payer: Self-pay

## 2022-11-15 NOTE — Telephone Encounter (Signed)
error 

## 2022-11-21 ENCOUNTER — Telehealth: Payer: Self-pay | Admitting: Pulmonary Disease

## 2022-11-21 NOTE — Telephone Encounter (Signed)
Myrna checking on prior authorization for Ohtuvayre. Pharmacy is [facility]. Sent thru Cover My Meds. Myrna phone number is [phone].

## 2022-11-22 ENCOUNTER — Ambulatory Visit: Payer: Medicaid Other | Attending: Cardiovascular Disease | Admitting: *Deleted

## 2022-11-22 DIAGNOSIS — Z5181 Encounter for therapeutic drug level monitoring: Secondary | ICD-10-CM | POA: Diagnosis not present

## 2022-11-22 DIAGNOSIS — I4892 Unspecified atrial flutter: Secondary | ICD-10-CM | POA: Diagnosis not present

## 2022-11-22 LAB — POCT INR: INR: 2.3 (ref 2.0–3.0)

## 2022-11-22 NOTE — Patient Instructions (Signed)
Continue warfarin 1 tablet daily except for 1/2 a tablet on Thursdays Recheck in 4 wks Call [phone] for questions or concerns.

## 2022-11-22 NOTE — Telephone Encounter (Signed)
Checking the status of PA

## 2022-11-24 ENCOUNTER — Other Ambulatory Visit (HOSPITAL_COMMUNITY): Payer: Self-pay

## 2022-11-24 ENCOUNTER — Telehealth: Payer: Self-pay

## 2022-11-24 NOTE — Telephone Encounter (Signed)
PA request has been Approved. New Encounter created for follow up. For additional info see Pharmacy Prior Auth telephone encounter from 09/26.

## 2022-11-24 NOTE — Telephone Encounter (Signed)
*  Pulm  Pharmacy Patient Advocate Encounter  Received notification from [facility] that Prior Authorization for Ohtuvayre 3MG /2.5ML suspension  has been APPROVED from 11/24/2022 to 11/23/2023. Ran test claim, Copay is $4.00. This test claim was processed through [facility]- copay amounts may vary at other pharmacies due to pharmacy/plan contracts, or as the patient moves through the different stages of their insurance plan.   PA #/Case ID/Reference #: [id number]

## 2022-11-28 ENCOUNTER — Ambulatory Visit: Payer: Medicaid Other | Admitting: Cardiovascular Disease

## 2022-11-29 ENCOUNTER — Ambulatory Visit (HOSPITAL_COMMUNITY): Payer: Medicaid Other

## 2022-11-30 ENCOUNTER — Other Ambulatory Visit: Payer: Self-pay | Admitting: Cardiovascular Disease

## 2022-11-30 ENCOUNTER — Other Ambulatory Visit (HOSPITAL_BASED_OUTPATIENT_CLINIC_OR_DEPARTMENT_OTHER): Payer: Self-pay | Admitting: Pulmonary Disease

## 2022-11-30 DIAGNOSIS — E782 Mixed hyperlipidemia: Secondary | ICD-10-CM

## 2022-11-30 DIAGNOSIS — I4892 Unspecified atrial flutter: Secondary | ICD-10-CM

## 2022-12-01 NOTE — Telephone Encounter (Signed)
Warfarin 5mg ; refill sent [facility] Last INR 11/22/22 Last OV 11/02/22

## 2022-12-06 ENCOUNTER — Encounter (HOSPITAL_BASED_OUTPATIENT_CLINIC_OR_DEPARTMENT_OTHER): Payer: Self-pay | Admitting: Pulmonary Disease

## 2022-12-06 DIAGNOSIS — J449 Chronic obstructive pulmonary disease, unspecified: Secondary | ICD-10-CM

## 2022-12-06 DIAGNOSIS — J9611 Chronic respiratory failure with hypoxia: Secondary | ICD-10-CM

## 2022-12-16 ENCOUNTER — Other Ambulatory Visit (HOSPITAL_BASED_OUTPATIENT_CLINIC_OR_DEPARTMENT_OTHER): Payer: Self-pay | Admitting: Pulmonary Disease

## 2022-12-20 ENCOUNTER — Ambulatory Visit: Payer: Medicaid Other | Attending: Cardiovascular Disease | Admitting: *Deleted

## 2022-12-20 DIAGNOSIS — Z5181 Encounter for therapeutic drug level monitoring: Secondary | ICD-10-CM | POA: Diagnosis not present

## 2022-12-20 DIAGNOSIS — I4892 Unspecified atrial flutter: Secondary | ICD-10-CM | POA: Diagnosis not present

## 2022-12-20 LAB — POCT INR: INR: 2.5 (ref 2.0–3.0)

## 2022-12-20 NOTE — Patient Instructions (Signed)
Continue warfarin 1 tablet daily except for 1/2 a tablet on Thursdays Recheck in 4 wks Call [phone] for questions or concerns.

## 2022-12-27 ENCOUNTER — Ambulatory Visit (HOSPITAL_BASED_OUTPATIENT_CLINIC_OR_DEPARTMENT_OTHER): Payer: Medicaid Other | Admitting: Pulmonary Disease

## 2023-01-04 ENCOUNTER — Ambulatory Visit
Admission: EM | Admit: 2023-01-04 | Discharge: 2023-01-04 | Disposition: A | Discharge status: Home / Self Care | Payer: Medicaid Other | Attending: Family Medicine | Admitting: Family Medicine

## 2023-01-04 DIAGNOSIS — J441 Chronic obstructive pulmonary disease with (acute) exacerbation: Secondary | ICD-10-CM

## 2023-01-04 DIAGNOSIS — J22 Unspecified acute lower respiratory infection: Secondary | ICD-10-CM | POA: Diagnosis not present

## 2023-01-04 MED ORDER — AZITHROMYCIN 250 MG PO TABS: ORAL_TABLET | ORAL | 0 refills | Status: DC

## 2023-01-04 MED ORDER — DEXAMETHASONE SODIUM PHOSPHATE 10 MG/ML IJ SOLN
10.0000 mg | Freq: Once | INTRAMUSCULAR | Status: AC
  Administered 2023-01-04: 10 mg via INTRAMUSCULAR

## 2023-01-04 MED ORDER — PROMETHAZINE HCL 25 MG PO TABS: 25.0000 mg | ORAL_TABLET | Freq: Three times a day (TID) | ORAL | 0 refills | Status: AC | PRN

## 2023-01-04 NOTE — ED Provider Notes (Signed)
[facility]    CSN: [id number] Arrival date & time: 01/04/23  1806      History   Chief Complaint No chief complaint on file.   HPI Lindsay Bonilla is a 62 y.o. female.   Patient presenting today with over a week of progressively worsening productive cough, shortness of breath, chest tightness.  Was seen last week at [place] care in [place] and prescribed amoxicillin which has not provided any relief.  She has a history of COPD and asthma on Breztri and albuterol, states these are no longer helping and the cough is becoming thicker mucus.    Past Medical History:  Diagnosis Date   Anxiety    Aortic aneurysm (HCC)    Aortic insufficiency    a. s/p Bentall with mechanical AVR 4/17 >> FU Echo 6/17: Mild LVH, EF 60-65%, normal wall motion, ventricular septum with paradoxical septal motion, St. Jude mechanical AVR functioning  normally with no regurgitation (mean 6 mmHg), MAC, trivial MR, mild RVE, trivial TR   Aortic stenosis, severe    Bicuspid valve   Arthritis    Asthma    Atrial flutter (HCC)    Bitten or stung by nonvenomous insect and other nonvenomous arthropods, initial encounter    Bronchitis    Chronic diastolic CHF (congestive heart failure) (HCC)    Chronic respiratory failure with hypoxia (HCC)    COPD (chronic obstructive pulmonary disease) (HCC)    DDD (degenerative disc disease), cervical    Depression    Endocarditis, valve unspecified    GERD (gastroesophageal reflux disease)    Hard of hearing    Heart failure (HCC)    Heart failure, unspecified (HCC)    Hemorrhage, not elsewhere classified    History of pneumonia    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   Hyperkalemia    Low back pain    Migraine headache    Mild CAD    Other chronic pain    Palpitations    PAT (paroxysmal atrial tachycardia) (HCC)    PONV (postoperative nausea and vomiting)    "only once"   Premature atrial contractions    PVC's  (premature ventricular contractions)    Shortness of breath    Nodule left lung CT done 8/6   Sinus bradycardia    Stress incontinence    Unspecified osteoarthritis, unspecified site    Valvular heart disease     Patient Active Problem List   Diagnosis Date Noted   Allergic rhinitis due to pollen 11/01/2022   Allergic rhinitis due to animal dander 11/01/2022   Anxiety 11/01/2022   Eustachian tube dysfunction 11/01/2022   OSA (obstructive sleep apnea) 08/11/2022   Chronic cough 01/11/2022   Allergy to environmental factors 08/19/2020   Gastroesophageal reflux disease 08/19/2020   Hypertensive disorder 08/19/2020   Acute on chronic heart failure with preserved ejection fraction (HFpEF) (HCC) 05/13/2019   Anticoagulated on Coumadin 05/13/2019   Endocarditis, valve unspecified    Heart failure (HCC)    Bitten or stung by nonvenomous insect and other nonvenomous arthropods, initial encounter    Chronic respiratory failure with hypoxia (HCC)    Heart failure, unspecified (HCC)    Hemorrhage, not elsewhere classified    Hyperkalemia    Low back pain    Other chronic pain    Unspecified osteoarthritis, unspecified site    Trochanteric bursitis of right hip 11/03/2017   Opiate overdose (HCC) 09/30/2017   Depression with anxiety 09/30/2017  Right foot injury 09/30/2017   Chronic pain syndrome 09/30/2017   Closed nondisplaced fracture of metatarsal bone of right foot    Cervical spondylosis 05/26/2017   Chronic diastolic CHF (congestive heart failure) (HCC) 05/05/2016   Hyperlipidemia 07/10/2015   Encounter for therapeutic drug monitoring 06/25/2015   Aortic valve replaced 06/25/2015   Atrial flutter (HCC) 06/21/2015   S/P aortic valve replacement 06/15/2015   S/P aortic valve replacement and aortoplasty 06/15/2015   Aortic valve disease    CAD (coronary artery disease), native coronary artery 08/29/2012   TOBACCO USER 02/25/2009   Aneurysm of thoracic aorta (HCC) 02/25/2009    DYSPNEA ON EXERTION 08/29/2008   MIGRAINE HEADACHE 08/21/2008   VALVULAR HEART DISEASE 08/21/2008   BRONCHITIS 08/21/2008   ASTHMA 08/21/2008   COPD, very severe (HCC) 08/21/2008   ARTHRITIS 08/21/2008   PALPITATIONS 08/21/2008    Past Surgical History:  Procedure Laterality Date   ABDOMINAL HYSTERECTOMY     ANTERIOR CERVICAL DECOMP/DISCECTOMY FUSION  10/05/2011   Procedure: ANTERIOR CERVICAL DECOMPRESSION/DISCECTOMY FUSION 2 LEVELS;  Surgeon: Karn Cassis, MD;  Location: [facility];  Service: Neurosurgery;  Laterality: N/A;  Cervical five - six , six - seven Anterior cervical decompression/diskectomy/fusion/plate   ANTERIOR CRUCIATE LIGAMENT REPAIR Right    AORTIC VALVE REPLACEMENT     ARTHROSCOPIC REPAIR ACL  rt   BENTALL PROCEDURE N/A 06/15/2015   Procedure: BENTALL PROCEDURE; HEMI-ARCH REPAIR WITH #23 ST JUDE MECHANICAL AVR CONDUIT AND #28 HEMASHIELD PLATINUM GRAFT;  Surgeon: Alleen Borne, MD;  Location: [facility] OR;  Service: Open Heart Surgery;  Laterality: N/A;   CARDIAC CATHETERIZATION N/A 05/21/2015   Procedure: Right/Left Heart Cath and Coronary Angiography;  Surgeon: Kathleene Hazel, MD;  Location: [facility] INVASIVE CV LAB;  Service: Cardiovascular;  Laterality: N/A;   CHOLECYSTECTOMY     COLONOSCOPY  10/13/05   COLONOSCOPY WITH PROPOFOL N/A 07/22/2021   Procedure: COLONOSCOPY WITH PROPOFOL;  Surgeon: Imogene Burn, MD;  Location: [facility];  Service: Gastroenterology;  Laterality: N/A;   DIAGNOSTIC LAPAROSCOPY     HEMOSTASIS CLIP PLACEMENT  07/22/2021   Procedure: HEMOSTASIS CLIP PLACEMENT;  Surgeon: Imogene Burn, MD;  Location: [facility];  Service: Gastroenterology;;   JOINT REPLACEMENT Right 2016   KNEE ARTHROSCOPY     rt x4  x1 lft   MYRINGOPLASTY W/ FAT GRAFT     graft from behind ear   POLYPECTOMY  07/22/2021   Procedure: POLYPECTOMY;  Surgeon: Imogene Burn, MD;  Location: [facility];  Service: Gastroenterology;;   Daiva Huge  10/13/05, 09/19/05   TEE  WITHOUT CARDIOVERSION N/A 06/15/2015   Procedure: TRANSESOPHAGEAL ECHOCARDIOGRAM (TEE);  Surgeon: Alleen Borne, MD;  Location: [facility] OR;  Service: Open Heart Surgery;  Laterality: N/A;   TONSILLECTOMY  2005   TUBAL LIGATION     TYMPANOSTOMY TUBE PLACEMENT      OB History     Gravida      Para      Term      Preterm      AB      Living  3      SAB      IAB      Ectopic      Multiple      Live Births               Home Medications    Prior to Admission medications   Medication Sig Start Date End Date Taking? Authorizing Provider  azithromycin (ZITHROMAX) 250  MG tablet Take first 2 tablets together, then 1 every day until finished. 01/04/23  Yes Particia Nearing, PA-C  promethazine (PHENERGAN) 25 MG tablet Take 1 tablet (25 mg total) by mouth every 8 (eight) hours as needed for nausea or vomiting. 01/04/23  Yes Particia Nearing, PA-C  albuterol (PROVENTIL) (2.5 MG/3ML) 0.083% nebulizer solution one vial by nebulization every 4 hours as needed for wheezing or shortness of breath. 12/17/22   Oretha Milch, MD  albuterol (VENTOLIN HFA) 108 (90 Base) MCG/ACT inhaler Inhale 2 puffs into the lungs every 6 (six) hours as needed for wheezing or shortness of breath. 10/17/22   Oretha Milch, MD  amoxicillin (AMOXIL) 500 MG capsule TAKE 4 TABLETS BY MOUTH 30-60 MINUTES PRIOR TO DENTAL PROCEDURES Patient not taking: Reported on 08/29/2022 02/11/22   Kathleene Hazel, MD  Ascorbic Acid (VITAMIN C ER PO) Take 2,000 mg by mouth daily.    [provider]  aspirin EC 81 MG tablet Take 1 tablet (81 mg total) by mouth daily. 07/23/15   Tereso Newcomer T, PA-C  Budeson-Glycopyrrol-Formoterol (BREZTRI AEROSPHERE) 160-9-4.8 MCG/ACT AERO Inhale 2 puffs into the lungs in the morning and at bedtime. 10/28/22   Oretha Milch, MD  chlorpheniramine (CHLOR-TRIMETON) 4 MG tablet Take 4 mg by mouth See admin instructions. Take 4 mg in the morning and 8 mg at bedtime     [provider]  Cholecalciferol (VITAMIN D) 125 MCG (5000 UT) CAPS Take 5,000 Units by mouth daily.    [provider]  Ensifentrine (OHTUVAYRE) 3 MG/2.5ML SUSP Inhale 2.5 mLs into the lungs 2 (two) times daily. Patient not taking: Reported on 11/02/2022 11/01/22   Oretha Milch, MD  EPINEPHrine (EPIPEN) 0.3 mg/0.3 mL IJ SOAJ injection Inject 0.3 mg into the muscle as needed for anaphylaxis. For allergic reaction    [provider]  furosemide (LASIX) 40 MG tablet Take 1 tablet (40 mg total) by mouth 2 (two) times daily. 11/02/22   Kathleene Hazel, MD  gabapentin (NEURONTIN) 600 MG tablet Take 600 mg by mouth 2 (two) times daily. 07/01/21   [provider]  linaclotide (LINZESS) 145 MCG CAPS capsule Take 145 mcg by mouth every morning. 09/08/20   [provider]  losartan (COZAAR) 25 MG tablet take 1 tablet once daily. 12/01/22   Kathleene Hazel, MD  magnesium oxide (MAG-OX) 400 MG tablet Take 1 tablet (400 mg total) by mouth daily. 12/31/18   Dunn, Tacey Ruiz, PA-C  metolazone (ZAROXOLYN) 2.5 MG tablet take 1 tablet by mouth on monday, wednesday and friday. 12/01/22   Kathleene Hazel, MD  montelukast (SINGULAIR) 10 MG tablet Take 1 tablet (10 mg total) by mouth at bedtime. 02/17/20   Oretha Milch, MD  naloxone [facility]) nasal spray 4 mg/0.1 mL Place 1 spray into the nose once.    [provider]  omeprazole (PRILOSEC) 40 MG capsule Take 40 mg by mouth daily.    [provider]  Oxycodone HCl 10 MG TABS     [provider]  oxyCODONE-acetaminophen (PERCOCET) 10-325 MG tablet Take 1 tablet by mouth 4 (four) times daily as needed for pain. Patient not taking: Reported on 11/02/2022    [provider]  potassium chloride (KLOR-CON M) 10 MEQ tablet Take 4 tablets (40 mEq total) by mouth 2 (two) times daily. 11/02/22   Kathleene Hazel, MD  rosuvastatin (CRESTOR) 20 MG tablet Take 1 tablet (20 mg total) by  mouth  daily. 11/14/22   Kathleene Hazel, MD  traZODone (DESYREL) 150 MG tablet Take 150 mg by mouth at bedtime.    [provider]  warfarin (COUMADIN) 5 MG tablet Take ONE-HALF (1/2) tablet to 1 tablet by mouth daily or as directed by the Anticoagulation Clinic. 12/01/22   Kathleene Hazel, MD    Family History Family History  Problem Relation Age of Onset   Cancer Mother        Type unknown-? lung   Heart attack Father    Asthma Brother    Colon cancer Brother        dx at age 10   Esophageal cancer Maternal Grandfather        smoker   Diabetes Other        family history   Hyperlipidemia Other        family history    Social History Social History   Tobacco Use   Smoking status: Former    Current packs/day: 0.00    Average packs/day: 0.1 packs/day for 30.0 years (3.0 ttl pk-yrs)    Types: Cigarettes    Start date: 04/28/1985    Quit date: 04/29/2015    Years since quitting: 7.6   Smokeless tobacco: Never  Vaping Use   Vaping status: Never Used  Substance Use Topics   Alcohol use: No    Alcohol/week: 0.0 standard drinks of alcohol   Drug use: No     Allergies   Alpha-gal, Beta adrenergic blockers, Doxycycline, Vancomycin, Avelox [moxifloxacin hcl in nacl], Ciprofloxacin, Cleocin [clindamycin hcl], Moxifloxacin, Sulfamethoxazole-trimethoprim, and Tape   Review of Systems Review of Systems Per HPI  Physical Exam Triage Vital Signs ED Triage Vitals  Encounter Vitals Group     BP 01/04/23 1827 (!) 143/77     Systolic BP Percentile --      Diastolic BP Percentile --      Pulse Rate 01/04/23 1827 80     Resp 01/04/23 1827 18     Temp 01/04/23 1827 98.1 F (36.7 C)     Temp Source 01/04/23 1827 Oral     SpO2 01/04/23 1827 92 %     Weight --      Height --      Head Circumference --      Peak Flow --      Pain Score 01/04/23 1829 0     Pain Loc --      Pain Education --      Exclude from Growth Chart --    No data found.  Updated  Vital Signs BP (!) 143/77 (BP Location: Right Arm)   Pulse 80   Temp 98.1 F (36.7 C) (Oral)   Resp 18   SpO2 92%   Visual Acuity Right Eye Distance:   Left Eye Distance:   Bilateral Distance:    Right Eye Near:   Left Eye Near:    Bilateral Near:     Physical Exam Vitals and nursing note reviewed.  Constitutional:      Appearance: Normal appearance.  HENT:     Head: Atraumatic.     Right Ear: Tympanic membrane and external ear normal.     Left Ear: Tympanic membrane and external ear normal.     Nose: Congestion present.     Mouth/Throat:     Mouth: Mucous membranes are moist.     Pharynx: Posterior oropharyngeal erythema present.  Eyes:     Extraocular Movements: Extraocular movements intact.  Conjunctiva/sclera: Conjunctivae normal.  Cardiovascular:     Rate and Rhythm: Normal rate and regular rhythm.     Heart sounds: Normal heart sounds.  Pulmonary:     Effort: Pulmonary effort is normal. No respiratory distress.     Breath sounds: Wheezing present.  Musculoskeletal:        General: Normal range of motion.     Cervical back: Normal range of motion and neck supple.  Skin:    General: Skin is warm and dry.  Neurological:     Mental Status: She is alert and oriented to person, place, and time.  Psychiatric:        Mood and Affect: Mood normal.        Thought Content: Thought content normal.      UC Treatments / Results  Labs (all labs ordered are listed, but only abnormal results are displayed) Labs Reviewed - No data to display  EKG   Radiology No results found.  Procedures Procedures (including critical care time)  Medications Ordered in UC Medications  dexamethasone (DECADRON) injection 10 mg (10 mg Intramuscular Given 01/04/23 1946)    Initial Impression / Assessment and Plan / UC Course  I have reviewed the triage vital signs and the nursing notes.  Pertinent labs & imaging results that were available during my care of the patient were  reviewed by me and considered in my medical decision making (see chart for details).     Vitals and exam overall reassuring, she appears in no acute distress.  Will switch to Zithromax for atypical coverage and IM Decadron for COPD exacerbation.  Phenergan sent for nausea as she states she has been nauseous with coughing spells.  She states she tolerates Phenergan well.  Supportive home care and return precautions reviewed.  Final Clinical Impressions(s) / UC Diagnoses   Final diagnoses:  COPD exacerbation (HCC)  Lower respiratory infection     Discharge Instructions      While you are on these antibiotics, make sure to be following up with your prescribing provider who writes the Coumadin to check your INRs as they can change your therapeutic levels.  We have also given you a steroid shot today.  Make sure to use your albuterol every 4 hours as needed and your Breztri consistently.  Follow-up for persisting or worsening symptoms.    ED Prescriptions     Medication Sig Dispense Auth. Provider   azithromycin (ZITHROMAX) 250 MG tablet Take first 2 tablets together, then 1 every day until finished. 6 tablet Particia Nearing, [place]   promethazine (PHENERGAN) 25 MG tablet Take 1 tablet (25 mg total) by mouth every 8 (eight) hours as needed for nausea or vomiting. 10 tablet Particia Nearing, [place]      PDMP not reviewed this encounter.   Particia Nearing, [place] 01/04/23 1956

## 2023-01-04 NOTE — ED Triage Notes (Signed)
Pt c/o cough and SOB, was seen last week in the Urgent Care in [place] they prescribed and Antibiotic pt has found no relief. Pt has a hx of COPD.   Pt is now experiencing hoarseness and coughing until she throws up.

## 2023-01-04 NOTE — Discharge Instructions (Signed)
While you are on these antibiotics, make sure to be following up with your prescribing provider who writes the Coumadin to check your INRs as they can change your therapeutic levels.  We have also given you a steroid shot today.  Make sure to use your albuterol every 4 hours as needed and your Breztri consistently.  Follow-up for persisting or worsening symptoms.

## 2023-01-17 ENCOUNTER — Ambulatory Visit: Payer: Medicaid Other | Attending: Cardiovascular Disease | Admitting: *Deleted

## 2023-01-17 DIAGNOSIS — I4892 Unspecified atrial flutter: Secondary | ICD-10-CM

## 2023-01-17 DIAGNOSIS — Z5181 Encounter for therapeutic drug level monitoring: Secondary | ICD-10-CM | POA: Diagnosis not present

## 2023-01-17 LAB — POCT INR: INR: 3.4 — AB (ref 2.0–3.0)

## 2023-01-17 NOTE — Patient Instructions (Signed)
Hold warfarin tonight then resume 1 tablet daily except for 1/2 a tablet on Thursdays Recheck in 4 wks Call [phone] for questions or concerns.

## 2023-02-01 ENCOUNTER — Other Ambulatory Visit (HOSPITAL_BASED_OUTPATIENT_CLINIC_OR_DEPARTMENT_OTHER): Payer: Self-pay | Admitting: Pulmonary Disease

## 2023-02-09 ENCOUNTER — Ambulatory Visit: Payer: Medicaid Other

## 2023-02-09 DIAGNOSIS — E782 Mixed hyperlipidemia: Secondary | ICD-10-CM

## 2023-02-09 DIAGNOSIS — I2510 Atherosclerotic heart disease of native coronary artery without angina pectoris: Secondary | ICD-10-CM

## 2023-02-14 ENCOUNTER — Ambulatory Visit: Payer: Medicaid Other | Attending: Cardiovascular Disease | Admitting: *Deleted

## 2023-02-14 DIAGNOSIS — I4892 Unspecified atrial flutter: Secondary | ICD-10-CM | POA: Diagnosis not present

## 2023-02-14 DIAGNOSIS — Z5181 Encounter for therapeutic drug level monitoring: Secondary | ICD-10-CM | POA: Diagnosis not present

## 2023-02-14 LAB — POCT INR: INR: 3.6 — AB (ref 2.0–3.0)

## 2023-02-14 NOTE — Patient Instructions (Signed)
Hold warfarin tonight then decrease dose to 1 tablet daily except for 1/2 a tablet on Mondays and Thursdays Recheck in 3 wks Call [phone] for questions or concerns.

## 2023-02-15 ENCOUNTER — Ambulatory Visit (HOSPITAL_BASED_OUTPATIENT_CLINIC_OR_DEPARTMENT_OTHER): Payer: Medicaid Other | Admitting: Pulmonary Disease

## 2023-02-15 ENCOUNTER — Encounter (HOSPITAL_BASED_OUTPATIENT_CLINIC_OR_DEPARTMENT_OTHER): Payer: Self-pay | Admitting: Pulmonary Disease

## 2023-02-15 VITALS — BP 100/68 | HR 83 | Resp 16 | Ht 63.0 in | Wt 185.9 lb

## 2023-02-15 DIAGNOSIS — J449 Chronic obstructive pulmonary disease, unspecified: Secondary | ICD-10-CM

## 2023-02-15 DIAGNOSIS — J9611 Chronic respiratory failure with hypoxia: Secondary | ICD-10-CM

## 2023-02-15 DIAGNOSIS — E782 Mixed hyperlipidemia: Secondary | ICD-10-CM

## 2023-02-15 NOTE — Progress Notes (Signed)
Subjective:    Patient ID: Lindsay Bonilla, female    DOB: 1961/01/13, 62 y.o.   MRN: [id number]  HPI  62 yo ex-smoker for FU of COPD and chronic hypoxic respiratory failure on 2 L of oxygen -quit smoking 2017, 1-2 PPD, >50 PYrs -last flare /ED visit 10/2020 -f/h/o COPD including a nephew @ 61 years old    PMH - s/p Aortic Valve Replacement/s/p Bentall procedure - Dr. Laneta Simmers on 05/2015 St. Jude Mechanical valve , original AVR in 1998 - sinus node dysfunction on Cardizem - HFpEF- on Lasix  -01/2020 covid infection -chronic back pain on percocet   Meds : 07/2022 -due to persistent high symptom burden, she was switched to nebulizer combination of budesonide/Brovana and Yupelri.   Roflumilast >>severe nausea and vomiting and headache     Chief Complaint  Patient presents with   Follow-up    COPD- breathing has been bad, she was sick, Lindsay Bonilla seems to be making her breathing worse. She has stopped it since getting sick, been off it for at least a month.  Was seen in ED 01/04/23. Was given 2 abx but has a horrible cough still.      Discussed the use of AI scribe software for clinical note transcription with the patient, who gave verbal consent to proceed.  History of Present Illness   a patient with a history of COPD, presents with a persistent cough and thick sputum production. She reports that her symptoms have worsened following a recent upper and lower respiratory infection. Despite completing a course of Amoxicillin 875 and a Z-Pak, her symptoms have not improved significantly. She describes the sputum as "really, really thick" and "hard to get up." She also mentions that she has been using Albuterol frequently due to her symptoms.  In addition to her respiratory issues, the patient discusses her struggles with her oxygen supply company, [facility] Bon[facility]. She reports that her portable oxygen device, which provides continuous flow, broke two years ago and has not been replaced. She expresses  frustration with the company's lack of responsiveness and is considering switching to a different provider.  The patient also mentions that she recently started a new cholesterol medication, Crestor, after Pravastatin was found to be ineffective. She requests blood work to check her cholesterol levels and to monitor the effectiveness of the new medication. She also mentions concerns about her INR levels, which were 3.6 the previous day, and wonders if the Crestor is affecting them.  Finally, the patient discusses a new breathing medication, O2 Wear, which she stopped taking due to worsening breathing symptoms. She expresses fear about restarting the medication due to the adverse effects she experienced.      Significant tests/ events reviewed   PFTs 04/2015 >>ratio 33, FEV1 0.69 /25% , improved to 0.95/35% with BD, FVC 61%, TLC 126 %, DLCO 80   06/2021 Alpha 1 137, MM LDCT chest 10/2021>> RLL 4mm nodule, stable   CT chest 07/2018 Coronary artery calcifications. Status post aortic valve and root graft repair, with probable reconstruction of the pulmonary outflow tract . Severe emphysema. Mild, diffuse bilateral bronchial wall thickening.   Echo 04/2019 nml LV fn, RVSP 37     HST 09/2022 >> mild OSA AHI 7/hour, low sat 84%   03/2017 NPSG >> no OSA, min desatn 77%  Review of Systems neg for any significant sore throat, dysphagia, itching, sneezing, nasal congestion or excess/ purulent secretions, fever, chills, sweats, unintended wt loss, pleuritic or exertional cp, hempoptysis, orthopnea pnd  or change in chronic leg swelling. Also denies presyncope, palpitations, heartburn, abdominal pain, nausea, vomiting, diarrhea or change in bowel or urinary habits, dysuria,hematuria, rash, arthralgias, visual complaints, headache, numbness weakness or ataxia.     Objective:   Physical Exam  Gen. Pleasant, well-nourished, in no distress ENT - no thrush, no pallor/icterus,no post nasal drip Neck: No JVD, no  thyromegaly, no carotid bruits Lungs: no use of accessory muscles, no dullness to percussion, clear without rales or rhonchi  Cardiovascular: Rhythm regular, heart sounds  normal, no murmurs or gallops, no peripheral edema Musculoskeletal: No deformities, no cyanosis or clubbing         Assessment & Plan:    Assessment and Plan    Chronic Obstructive Pulmonary Disease (COPD) Flare Reports worsening dyspnea and persistent, thick cough. Diagnosed with lower respiratory infection and COPD flare at [facility]. Using Breztri, Singulair, and albuterol. Hesitant to restart oxygen therapy due to previous adverse effects, including worsening symptoms. Discussed holding off on oxygen therapy until fully recovered and reassessing later. - Continue Breztri - Continue Singulair - Continue albuterol as needed - Hold off on restarting oxygen therapy until fully recovered - Call supplier to stop oxygen deliveries - Reassess if symptoms persist -stop ohtuwayre for now due to side effects  Upper Respiratory Infection Initially diagnosed and treated with amoxicillin and azithromycin, but symptoms did not improve. Persistent thick cough noted. - Monitor symptoms - Consider further evaluation if symptoms persist  Oxygen Therapy Management Issues with oxygen supplier, Lindsay Bonilla, regarding delivery and functionality of oxygen equipment. Requires continuous flow oxygen. Problems with Lindsay [facility]illa device noted. Discussed potential need to requalify for oxygen therapy and switching suppliers if issues persist. - Check with Lindsay Bonilla regarding new oxygen equipment delivery - Does not requalify for POC today - Switch oxygen supplier if issues persist  Anticoagulation Management INR recently checked and was 3.6. Concern that Crestor may be affecting INR levels. - Monitor INR levels closely - Consider adjusting anticoagulation therapy if necessary  Hyperlipidemia On Crestor for hyperlipidemia. Missed recent  appointment for blood work to check cholesterol levels. Previous medication, pravastatin, was ineffective. Discussed the need for fasting before cholesterol test. - Order blood work to check cholesterol levels - Forward results to Dr. Sanjuana Kava  Follow-up - Schedule follow-up appointment in three to four months.

## 2023-02-15 NOTE — Assessment & Plan Note (Signed)
x

## 2023-02-15 NOTE — Patient Instructions (Addendum)
X check LFTs/ lipid panel  OK to stay off ohtuwayre for now  X Ambulatory sat

## 2023-02-16 LAB — LIPID PANEL
Chol/HDL Ratio: 2.8 {ratio} (ref 0.0–4.4)
Cholesterol, Total: 169 mg/dL (ref 100–199)
HDL: 61 mg/dL (ref >39)
LDL Chol Calc (NIH): 78 mg/dL (ref 0–99)
Triglycerides: 180 mg/dL — ABNORMAL HIGH (ref 0–149)
VLDL Cholesterol Cal: 30 mg/dL (ref 5–40)

## 2023-02-16 LAB — HEPATIC FUNCTION PANEL
ALT: 19 [IU]/L (ref 0–32)
AST: 21 [IU]/L (ref 0–40)
Albumin: 4.3 g/dL (ref 3.9–4.9)
Alkaline Phosphatase: 72 [IU]/L (ref 44–121)
Bilirubin Total: 0.5 mg/dL (ref 0.0–1.2)
Bilirubin, Direct: 0.2 mg/dL (ref 0.00–0.40)
Total Protein: 6.2 g/dL (ref 6.0–8.5)

## 2023-03-03 ENCOUNTER — Other Ambulatory Visit (HOSPITAL_BASED_OUTPATIENT_CLINIC_OR_DEPARTMENT_OTHER): Payer: Self-pay | Admitting: Pulmonary Disease

## 2023-03-08 ENCOUNTER — Ambulatory Visit: Payer: Medicaid Other | Attending: Cardiovascular Disease | Admitting: *Deleted

## 2023-03-08 DIAGNOSIS — Z5181 Encounter for therapeutic drug level monitoring: Secondary | ICD-10-CM

## 2023-03-08 DIAGNOSIS — I4892 Unspecified atrial flutter: Secondary | ICD-10-CM | POA: Diagnosis not present

## 2023-03-08 LAB — POCT INR: INR: 3.2 — AB (ref 2.0–3.0)

## 2023-03-08 NOTE — Patient Instructions (Signed)
 Decrease warfarin to 1 tablet daily except for 1/2 a tablet on Mondays, Wednesdays and Saturdays Recheck in 4 wks Call [phone] for questions or concerns.

## 2023-03-22 ENCOUNTER — Other Ambulatory Visit (HOSPITAL_BASED_OUTPATIENT_CLINIC_OR_DEPARTMENT_OTHER): Payer: Self-pay | Admitting: Pulmonary Disease

## 2023-03-22 ENCOUNTER — Encounter (HOSPITAL_BASED_OUTPATIENT_CLINIC_OR_DEPARTMENT_OTHER): Payer: Self-pay | Admitting: Pulmonary Disease

## 2023-03-22 MED ORDER — BREZTRI AEROSPHERE 160-9-4.8 MCG/ACT IN AERO: 2.0000 | INHALATION_SPRAY | Freq: Two times a day (BID) | RESPIRATORY_TRACT | 3 refills | Status: DC

## 2023-03-22 NOTE — Addendum Note (Signed)
Addended by: Jama Flavors on: 03/22/2023 11:53 AM   Modules accepted: Orders

## 2023-04-04 ENCOUNTER — Other Ambulatory Visit: Payer: Self-pay | Admitting: Cardiovascular Disease

## 2023-04-04 ENCOUNTER — Other Ambulatory Visit: Payer: Self-pay | Admitting: Pulmonary Disease

## 2023-04-04 DIAGNOSIS — I4892 Unspecified atrial flutter: Secondary | ICD-10-CM

## 2023-04-04 NOTE — Telephone Encounter (Signed)
Prescription refill request received for warfarin Lov: 11/02/22 Clifton James)  Next INR check: 04/05/23 Clifton James)  Warfarin tablet strength: 5mg   Appropriate dose. Refill sent.

## 2023-04-05 ENCOUNTER — Ambulatory Visit: Payer: Medicaid Other | Attending: Cardiovascular Disease | Admitting: *Deleted

## 2023-04-05 DIAGNOSIS — Z5181 Encounter for therapeutic drug level monitoring: Secondary | ICD-10-CM

## 2023-04-05 DIAGNOSIS — I4892 Unspecified atrial flutter: Secondary | ICD-10-CM

## 2023-04-05 LAB — POCT INR: POC INR: 2.6

## 2023-04-05 NOTE — Patient Instructions (Addendum)
 Description   Continue taking 1 tablet daily except for 1/2 a tablet on Mondays, Wednesdays and Fridays. Recheck in 5 wks Call [phone] for questions or concerns.

## 2023-04-07 ENCOUNTER — Ambulatory Visit
Admission: EM | Admit: 2023-04-07 | Discharge: 2023-04-07 | Disposition: A | Discharge status: Home / Self Care | Payer: Medicaid Other | Attending: Family Medicine | Admitting: Family Medicine

## 2023-04-07 DIAGNOSIS — J441 Chronic obstructive pulmonary disease with (acute) exacerbation: Secondary | ICD-10-CM

## 2023-04-07 MED ORDER — AZITHROMYCIN 250 MG PO TABS: ORAL_TABLET | ORAL | 0 refills | Status: DC

## 2023-04-07 MED ORDER — DEXAMETHASONE SODIUM PHOSPHATE 10 MG/ML IJ SOLN
10.0000 mg | Freq: Once | INTRAMUSCULAR | Status: AC
  Administered 2023-04-07: 10 mg via INTRAMUSCULAR

## 2023-04-07 NOTE — Discharge Instructions (Signed)
 We have given you a steroid injection and prescribed antibiotic today for a COPD exacerbation.  Continue your home inhalers and nebulizer treatments, and may take plain Mucinex , drink plenty of fluids and take deep breaths.  Make sure to follow-up as soon as possible with your primary care provider to recheck symptoms as well as follow your INR since antibiotics can interact with your blood thinner.  Go to the emergency department for worsening symptoms.

## 2023-04-07 NOTE — ED Provider Notes (Signed)
 [facility]    CSN: [id number] Arrival date & time: 04/07/23  1801      History   Chief Complaint Chief Complaint  Patient presents with   Shortness of Breath    HPI Lindsay Bonilla is a 63 y.o. female.   Patient presenting today with several day history of progressively worsening cough, shortness of breath, chest tightness.  States they have been doing some painting at home and thinks this is irritating her COPD.  She denies fever, chills, chest pain, abdominal pain, nausea vomiting or diarrhea.  Her past medical history is significant for asthma, COPD, atrial flutter, heart failure, valvular disease and patient is on chronic Coumadin  therapy.  States she takes 2 L of oxygen  via nasal cannula at bedtime typically but has been using it throughout the day as well for the last few days since onset of symptoms.    Past Medical History:  Diagnosis Date   Anxiety    Aortic aneurysm (HCC)    Aortic insufficiency    a. s/p Bentall with mechanical AVR 4/17 >> FU Echo 6/17: Mild LVH, EF 60-65%, normal wall motion, ventricular septum with paradoxical septal motion, St. Jude mechanical AVR functioning  normally with no regurgitation (mean 6 mmHg), MAC, trivial MR, mild RVE, trivial TR   Aortic stenosis, severe    Bicuspid valve   Arthritis    Asthma    Atrial flutter (HCC)    Bitten or stung by nonvenomous insect and other nonvenomous arthropods, initial encounter    Bronchitis    Chronic diastolic CHF (congestive heart failure) (HCC)    Chronic respiratory failure with hypoxia (HCC)    COPD (chronic obstructive pulmonary disease) (HCC)    DDD (degenerative disc disease), cervical    Depression    Endocarditis, valve unspecified    GERD (gastroesophageal reflux disease)    Hard of hearing    Heart failure (HCC)    Heart failure, unspecified (HCC)    Hemorrhage, not elsewhere classified    History of pneumonia    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   Hyperkalemia    Low back pain    Migraine headache    Mild CAD    Other chronic pain    Palpitations    PAT (paroxysmal atrial tachycardia) (HCC)    PONV (postoperative nausea and vomiting)    only once   Premature atrial contractions    PVC's (premature ventricular contractions)    Shortness of breath    Nodule left lung CT done 8/6   Sinus bradycardia    Stress incontinence    Unspecified osteoarthritis, unspecified site    Valvular heart disease     Patient Active Problem List   Diagnosis Date Noted   Allergic rhinitis due to pollen 11/01/2022   Allergic rhinitis due to animal dander 11/01/2022   Anxiety 11/01/2022   Eustachian tube dysfunction 11/01/2022   OSA (obstructive sleep apnea) 08/11/2022   Chronic cough 01/11/2022   Allergy to environmental factors 08/19/2020   Gastroesophageal reflux disease 08/19/2020   Hypertensive disorder 08/19/2020   Acute on chronic heart failure with preserved ejection fraction (HFpEF) (HCC) 05/13/2019   Anticoagulated on Coumadin  05/13/2019   Endocarditis, valve unspecified    Heart failure (HCC)    Bitten or stung by nonvenomous insect and other nonvenomous arthropods, initial encounter    Chronic respiratory failure with hypoxia (HCC)    Heart failure, unspecified (HCC)    Hemorrhage, not elsewhere  classified    Hyperkalemia    Low back pain    Other chronic pain    Unspecified osteoarthritis, unspecified site    Trochanteric bursitis of right hip 11/03/2017   Opiate overdose (HCC) 09/30/2017   Depression with anxiety 09/30/2017   Right foot injury 09/30/2017   Chronic pain syndrome 09/30/2017   Closed nondisplaced fracture of metatarsal bone of right foot    Cervical spondylosis 05/26/2017   Chronic diastolic CHF (congestive heart failure) (HCC) 05/05/2016   Hyperlipidemia 07/10/2015   Encounter for therapeutic drug monitoring 06/25/2015   Aortic valve replaced 06/25/2015   Atrial flutter (HCC) 06/21/2015    S/P aortic valve replacement 06/15/2015   S/P aortic valve replacement and aortoplasty 06/15/2015   Aortic valve disease    CAD (coronary artery disease), native coronary artery 08/29/2012   TOBACCO USER 02/25/2009   Aneurysm of thoracic aorta (HCC) 02/25/2009   DYSPNEA ON EXERTION 08/29/2008   MIGRAINE HEADACHE 08/21/2008   VALVULAR HEART DISEASE 08/21/2008   BRONCHITIS 08/21/2008   ASTHMA 08/21/2008   COPD, very severe (HCC) 08/21/2008   ARTHRITIS 08/21/2008   PALPITATIONS 08/21/2008    Past Surgical History:  Procedure Laterality Date   ABDOMINAL HYSTERECTOMY     ANTERIOR CERVICAL DECOMP/DISCECTOMY FUSION  10/05/2011   Procedure: ANTERIOR CERVICAL DECOMPRESSION/DISCECTOMY FUSION 2 LEVELS;  Surgeon: Catalina CHRISTELLA Stains, MD;  Location: [facility];  Service: Neurosurgery;  Laterality: N/A;  Cervical five - six , six - seven Anterior cervical decompression/diskectomy/fusion/plate   ANTERIOR CRUCIATE LIGAMENT REPAIR Right    AORTIC VALVE REPLACEMENT     ARTHROSCOPIC REPAIR ACL  rt   BENTALL PROCEDURE N/A 06/15/2015   Procedure: BENTALL PROCEDURE; HEMI-ARCH REPAIR WITH #23 ST JUDE MECHANICAL AVR CONDUIT AND #28 HEMASHIELD PLATINUM GRAFT;  Surgeon: Dorise MARLA Fellers, MD;  Location: [facility] OR;  Service: Open Heart Surgery;  Laterality: N/A;   CARDIAC CATHETERIZATION N/A 05/21/2015   Procedure: Right/Left Heart Cath and Coronary Angiography;  Surgeon: Lonni JONETTA Cash, MD;  Location: [facility]ASIVE CV LAB;  Service: Cardiovascular;  Laterality: N/A;   CHOLECYSTECTOMY     COLONOSCOPY  10/13/05   COLONOSCOPY WITH PROPOFOL  N/A 07/22/2021   Procedure: COLONOSCOPY WITH PROPOFOL ;  Surgeon: Federico Rosario BROCKS, MD;  Location: [facility];  Service: Gastroenterology;  Laterality: N/A;   DIAGNOSTIC LAPAROSCOPY     HEMOSTASIS CLIP PLACEMENT  07/22/2021   Procedure: HEMOSTASIS CLIP PLACEMENT;  Surgeon: Federico Rosario BROCKS, MD;  Location: [facility];  Service: Gastroenterology;;   JOINT REPLACEMENT Right 2016   KNEE  ARTHROSCOPY     rt x4  x1 lft   MYRINGOPLASTY W/ FAT GRAFT     graft from behind ear   POLYPECTOMY  07/22/2021   Procedure: POLYPECTOMY;  Surgeon: Federico Rosario BROCKS, MD;  Location: [facility];  Service: Gastroenterology;;   [place]  10/13/05, 09/19/05   TEE WITHOUT CARDIOVERSION N/A 06/15/2015   Procedure: TRANSESOPHAGEAL ECHOCARDIOGRAM (TEE);  Surgeon: Dorise MARLA Fellers, MD;  Location: [facility] OR;  Service: Open Heart Surgery;  Laterality: N/A;   TONSILLECTOMY  2005   TUBAL LIGATION     TYMPANOSTOMY TUBE PLACEMENT      OB History     Gravida      Para      Term      Preterm      AB      Living  3      SAB      IAB      Ectopic  Multiple      Live Births               Home Medications    Prior to Admission medications   Medication Sig Start Date End Date Taking? Authorizing Provider  azithromycin  (ZITHROMAX ) 250 MG tablet Take first 2 tablets together, then 1 every day until finished. 04/07/23  Yes Stuart Vernell Norris, PA-C  albuterol  (PROVENTIL ) (2.5 MG/3ML) 0.083% nebulizer solution one vial by nebulization every 4 hours as needed for wheezing or shortness of breath. 12/17/22   Jude Harden GAILS, MD  albuterol  (VENTOLIN  HFA) 108 (90 Base) MCG/ACT inhaler inhale 2 puffs into the lungs every 6 (six) hours as needed for wheezing or shortness of breath. 04/07/23   Jude Harden GAILS, MD  amoxicillin  (AMOXIL ) 500 MG capsule TAKE 4 TABLETS BY MOUTH 30-60 MINUTES PRIOR TO DENTAL PROCEDURES Patient not taking: Reported on 08/29/2022 02/11/22   Verlin Lonni BIRCH, MD  Ascorbic Acid (VITAMIN C ER PO) Take 2,000 mg by mouth daily.    [provider]  aspirin  EC 81 MG tablet Take 1 tablet (81 mg total) by mouth daily. 07/23/15   Lelon Hamilton T, PA-C  azithromycin  (ZITHROMAX ) 250 MG tablet Take first 2 tablets together, then 1 every day until finished. 01/04/23   Stuart Vernell Norris, PA-C  Budeson-Glycopyrrol-Formoterol  (BREZTRI  AEROSPHERE) 160-9-4.8 MCG/ACT AERO  Inhale 2 puffs into the lungs in the morning and at bedtime. 03/22/23   Jude Harden GAILS, MD  chlorpheniramine (CHLOR-TRIMETON) 4 MG tablet Take 4 mg by mouth See admin instructions. Take 4 mg in the morning and 8 mg at bedtime    [provider]  Cholecalciferol (VITAMIN D) 125 MCG (5000 UT) CAPS Take 5,000 Units by mouth daily.    [provider]  EPINEPHrine  (EPIPEN ) 0.3 mg/0.3 mL IJ SOAJ injection Inject 0.3 mg into the muscle as needed for anaphylaxis. For allergic reaction    [provider]  furosemide  (LASIX ) 40 MG tablet Take 1 tablet (40 mg total) by mouth 2 (two) times daily. 11/02/22   Verlin Lonni BIRCH, MD  gabapentin  (NEURONTIN ) 600 MG tablet Take 600 mg by mouth 2 (two) times daily. 07/01/21   [provider]  linaclotide  (LINZESS ) 145 MCG CAPS capsule Take 145 mcg by mouth every morning. 09/08/20   [provider]  losartan  (COZAAR ) 25 MG tablet take 1 tablet once daily. 12/01/22   Verlin Lonni BIRCH, MD  magnesium  oxide (MAG-OX) 400 MG tablet Take 1 tablet (400 mg total) by mouth daily. 12/31/18   Dunn, Dayna N, PA-C  metolazone  (ZAROXOLYN ) 2.5 MG tablet take 1 tablet by mouth on monday, wednesday and friday. 12/01/22   Verlin Lonni BIRCH, MD  montelukast  (SINGULAIR ) 10 MG tablet take 1 tablet by mouth at bedtime. 03/06/23   Jude Harden GAILS, MD  naloxone  (NARCAN ) nasal spray 4 mg/0.1 mL Place 1 spray into the nose once.    [provider]  omeprazole  (PRILOSEC) 40 MG capsule Take 40 mg by mouth daily.    [provider]  Oxycodone  HCl 10 MG TABS     [provider]  potassium chloride  (KLOR-CON  M) 10 MEQ tablet Take 4 tablets (40 mEq total) by mouth 2 (two) times daily. 11/02/22   Verlin Lonni BIRCH, MD  promethazine  (PHENERGAN ) 25 MG tablet Take 1 tablet (25 mg total) by mouth every 8 (eight) hours as needed for nausea or vomiting. 01/04/23   Stuart Vernell Norris, PA-C  rosuvastatin  (CRESTOR ) 20 MG tablet  Take  1 tablet (20 mg total) by mouth daily. 11/14/22   Verlin Lonni BIRCH, MD  traZODone  (DESYREL ) 150 MG tablet Take 150 mg by mouth at bedtime.    [provider]  warfarin (COUMADIN ) 5 MG tablet take one-half (1/2) tablet to 1 tablet by mouth daily or as directed by the anticoagulation clinic. 04/04/23   Verlin Lonni BIRCH, MD    Family History Family History  Problem Relation Age of Onset   Cancer Mother        Type unknown-? lung   Heart attack Father    Asthma Brother    Colon cancer Brother        dx at age 72   Esophageal cancer Maternal Grandfather        smoker   Diabetes Other        family history   Hyperlipidemia Other        family history    Social History Social History   Tobacco Use   Smoking status: Former    Current packs/day: 0.00    Average packs/day: 0.1 packs/day for 30.0 years (3.0 ttl pk-yrs)    Types: Cigarettes    Start date: 04/28/1985    Quit date: 04/29/2015    Years since quitting: 7.9   Smokeless tobacco: Never  Vaping Use   Vaping status: Never Used  Substance Use Topics   Alcohol use: No    Alcohol/week: 0.0 standard drinks of alcohol   Drug use: No     Allergies   Alpha-gal, Beta adrenergic blockers, Doxycycline , Vancomycin , Avelox [moxifloxacin hcl in nacl], Ciprofloxacin, Cleocin [clindamycin hcl], Moxifloxacin, Sulfamethoxazole-trimethoprim, and Tape   Review of Systems Review of Systems Per HPI  Physical Exam Triage Vital Signs ED Triage Vitals  Encounter Vitals Group     BP 04/07/23 1809 124/77     Systolic BP Percentile --      Diastolic BP Percentile --      Pulse Rate 04/07/23 1809 85     Resp 04/07/23 1809 20     Temp 04/07/23 1809 98.8 F (37.1 C)     Temp Source 04/07/23 1809 Oral     SpO2 04/07/23 1809 92 %     Weight --      Height --      Head Circumference --      Peak Flow --      Pain Score 04/07/23 1810 0     Pain Loc --      Pain Education --      Exclude from Growth Chart --     No data found.  Updated Vital Signs BP 124/77 (BP Location: Right Arm)   Pulse 85   Temp 98.8 F (37.1 C) (Oral)   Resp 20   SpO2 92%   Visual Acuity Right Eye Distance:   Left Eye Distance:   Bilateral Distance:    Right Eye Near:   Left Eye Near:    Bilateral Near:     Physical Exam Vitals and nursing note reviewed.  Constitutional:      Appearance: Normal appearance.  HENT:     Head: Atraumatic.     Right Ear: Tympanic membrane and external ear normal.     Left Ear: Tympanic membrane and external ear normal.     Nose: Nose normal.     Mouth/Throat:     Mouth: Mucous membranes are moist.     Pharynx: Posterior oropharyngeal erythema present.  Eyes:     Extraocular Movements: Extraocular  movements intact.     Conjunctiva/sclera: Conjunctivae normal.  Cardiovascular:     Rate and Rhythm: Normal rate and regular rhythm.     Heart sounds: Normal heart sounds.  Pulmonary:     Effort: Pulmonary effort is normal.     Breath sounds: Wheezing present.     Comments: Decreased breath sounds throughout Musculoskeletal:        General: Normal range of motion.     Cervical back: Normal range of motion and neck supple.  Skin:    General: Skin is warm and dry.  Neurological:     Mental Status: She is alert and oriented to person, place, and time.  Psychiatric:        Mood and Affect: Mood normal.        Thought Content: Thought content normal.      UC Treatments / Results  Labs (all labs ordered are listed, but only abnormal results are displayed) Labs Reviewed - No data to display  EKG   Radiology No results found.  Procedures Procedures (including critical care time)  Medications Ordered in UC Medications  dexamethasone  (DECADRON ) injection 10 mg (10 mg Intramuscular Given 04/07/23 1834)    Initial Impression / Assessment and Plan / UC Course  I have reviewed the triage vital signs and the nursing notes.  Pertinent labs & imaging results that were  available during my care of the patient were reviewed by me and considered in my medical decision making (see chart for details).     Suspect COPD exacerbation.  Oxygen  saturations 92% on room air, and she was placed on 2 L via nasal cannula per her request as this has been helping with her symptoms at home.  Will treat with IM Decadron , Zithromax , continued breathing treatments and inhaler regimen and close PCP follow-up.  ED for worsening symptoms.  Final Clinical Impressions(s) / UC Diagnoses   Final diagnoses:  COPD exacerbation [facility])     Discharge Instructions      We have given you a steroid injection and prescribed antibiotic today for a COPD exacerbation.  Continue your home inhalers and nebulizer treatments, and may take plain Mucinex , drink plenty of fluids and take deep breaths.  Make sure to follow-up as soon as possible with your primary care provider to recheck symptoms as well as follow your INR since antibiotics can interact with your blood thinner.  Go to the emergency department for worsening symptoms.    ED Prescriptions     Medication Sig Dispense Auth. Provider   azithromycin  (ZITHROMAX ) 250 MG tablet Take first 2 tablets together, then 1 every day until finished. 6 tablet Stuart Vernell Norris, [place]      PDMP not reviewed this encounter.   Stuart Vernell Norris, [place] 04/07/23 1835

## 2023-04-07 NOTE — ED Triage Notes (Signed)
 Pt states she has COPD and is feeling more SOB. Face flushed Sat 92% on RA. Pt states she has been using o2 at home.

## 2023-04-28 ENCOUNTER — Telehealth (HOSPITAL_BASED_OUTPATIENT_CLINIC_OR_DEPARTMENT_OTHER): Payer: Self-pay | Admitting: Pulmonary Disease

## 2023-04-28 NOTE — Telephone Encounter (Signed)
 Pt is worried about losing her oxygen as [employer] is going to request  last notes and she did not qualify for POC at that visit. She states just sitting still at UC recently in an exacerbation she was at 92% and she can't afford to lose her oxygen.

## 2023-04-28 NOTE — Telephone Encounter (Signed)
 Patient called regarding O2 management. [employer] had called stating they are sending a request for at home O2 and her POC. Patient called stating Dr. Vassie Loll is wanting her to stop using O2 and feels strongly that she cannot go without and has has a COPD flare recently. Patient would like to speak to a nurse regarding this.  Per 02/15/2023 encounter notes: Hold off on restarting oxygen therapy until fully recovered AND  Call supplier to stop oxygen deliveries - Reassess if symptoms persist  Patient was having issues with [employer], but [employer] advised patient if she switched DME companies O2 would be taken away, so patient is still with [employer].  Please advise and call patient back

## 2023-05-01 NOTE — Telephone Encounter (Signed)
 Currently is scheduled for an appointment in April

## 2023-05-01 NOTE — Telephone Encounter (Signed)
 Pt notified and she will keep her appt for April unless she feels she needs O2 sooner

## 2023-05-10 ENCOUNTER — Ambulatory Visit: Payer: Medicaid Other | Attending: Cardiovascular Disease | Admitting: *Deleted

## 2023-05-10 DIAGNOSIS — Z5181 Encounter for therapeutic drug level monitoring: Secondary | ICD-10-CM

## 2023-05-10 DIAGNOSIS — I4892 Unspecified atrial flutter: Secondary | ICD-10-CM | POA: Diagnosis not present

## 2023-05-10 LAB — POCT INR: INR: 1.9 — AB (ref 2.0–3.0)

## 2023-05-10 NOTE — Patient Instructions (Signed)
 Continue taking 1 tablet daily except for 1/2 a tablet on Mondays, Wednesdays and Fridays. Recheck in 4 wks Call [phone] for questions or concerns.

## 2023-05-31 ENCOUNTER — Other Ambulatory Visit: Payer: Self-pay | Admitting: Cardiovascular Disease

## 2023-05-31 ENCOUNTER — Other Ambulatory Visit: Payer: Self-pay | Admitting: Pulmonary Disease

## 2023-05-31 DIAGNOSIS — I4892 Unspecified atrial flutter: Secondary | ICD-10-CM

## 2023-06-01 ENCOUNTER — Ambulatory Visit (HOSPITAL_BASED_OUTPATIENT_CLINIC_OR_DEPARTMENT_OTHER): Payer: Medicaid Other | Admitting: Pulmonary Disease

## 2023-06-01 ENCOUNTER — Encounter (HOSPITAL_BASED_OUTPATIENT_CLINIC_OR_DEPARTMENT_OTHER): Payer: Self-pay | Admitting: Pulmonary Disease

## 2023-06-01 VITALS — BP 124/80 | HR 105 | Ht 63.0 in | Wt 182.6 lb

## 2023-06-01 DIAGNOSIS — G4733 Obstructive sleep apnea (adult) (pediatric): Secondary | ICD-10-CM | POA: Diagnosis not present

## 2023-06-01 DIAGNOSIS — J9611 Chronic respiratory failure with hypoxia: Secondary | ICD-10-CM

## 2023-06-01 MED ORDER — ALBUTEROL SULFATE (2.5 MG/3ML) 0.083% IN NEBU: 2.5000 mg | INHALATION_SOLUTION | RESPIRATORY_TRACT | 5 refills | Status: DC | PRN

## 2023-06-01 MED ORDER — BREZTRI AEROSPHERE 160-9-4.8 MCG/ACT IN AERO: 2.0000 | INHALATION_SPRAY | Freq: Two times a day (BID) | RESPIRATORY_TRACT | 5 refills | Status: DC

## 2023-06-01 MED ORDER — ALBUTEROL SULFATE HFA 108 (90 BASE) MCG/ACT IN AERS: 2.0000 | INHALATION_SPRAY | Freq: Four times a day (QID) | RESPIRATORY_TRACT | 5 refills | Status: DC | PRN

## 2023-06-01 MED ORDER — MONTELUKAST SODIUM 10 MG PO TABS: 10.0000 mg | ORAL_TABLET | Freq: Every day | ORAL | 5 refills | Status: DC

## 2023-06-01 NOTE — Telephone Encounter (Signed)
 Prescription refill request received for warfarin Lov: 11/02/22 Lindsay Bonilla)  Next INR check: 06/07/23 Warfarin tablet strength: 5mg   Appropriate dose. Refill sent.

## 2023-06-01 NOTE — Progress Notes (Signed)
 Subjective:    Patient ID: Lindsay Bonilla, female    DOB: 23-Nov-1960, 63 y.o.   MRN: [id number]  HPI  63 yo ex-smoker for FU of COPD and chronic hypoxic respiratory failure on 2 L of oxygen -quit smoking 2017, 1-2 PPD, >50 PYrs -last flare /ED visit 10/2020 -f/h/o COPD including a nephew @ 49 years old    PMH - s/p Aortic Valve Replacement/s/p Bentall procedure - Dr. Laneta Simmers on 05/2015 St. Jude Mechanical valve , original AVR in 1998 - sinus node dysfunction on Cardizem - HFpEF- on Lasix  -01/2020 covid infection -chronic back pain on percocet   Meds : 07/2022 -due to persistent high symptom burden, she was switched to nebulizer combination of budesonide/Brovana and Yupelri.   Roflumilast >>severe nausea and vomiting and headache    4 month FU visit  The patient, with a history of COPD and chronic hypoxic respiratory failure, presents with concerns about fluctuating oxygen levels. She reports a recent visit to the emergency room due to a pulled muscle, during which her oxygen level dropped to 86. The patient is currently on Breztri and has noticed improved breathing in the mornings after taking the medication. She also reports a recent COPD flare-up in February, for which she received a steroid shot and antibiotics at an urgent care center. The patient uses albuterol via a nebulizer, but has been using it more frequently recently, up to four times a day. She also expresses concern about potential loss of her oxygen supply due to recent normal readings, and is unsure about the dosage of her albuterol nebulizer. DME is requesting oxygen re-eval  Significant tests/ events reviewed   PFTs 04/2015 >>ratio 33, FEV1 0.69 /25% , improved to 0.95/35% with BD, FVC 61%, TLC 126 %, DLCO 80   06/2021 Alpha 1 137, MM LDCT chest 10/2021>> RLL 4mm nodule, stable   CT chest 07/2018 Coronary artery calcifications. Status post aortic valve and root graft repair, with probable reconstruction of the pulmonary  outflow tract . Severe emphysema. Mild, diffuse bilateral bronchial wall thickening.   Echo 04/2019 nml LV fn, RVSP 37     HST 09/2022 >> mild OSA AHI 7/hour, low sat 84%   03/2017 NPSG >> no OSA, min desatn 77%     Review of Systems neg for any significant sore throat, dysphagia, itching, sneezing, nasal congestion or excess/ purulent secretions, fever, chills, sweats, unintended wt loss, pleuritic or exertional cp, hempoptysis, orthopnea pnd or change in chronic leg swelling. Also denies presyncope, palpitations, heartburn, abdominal pain, nausea, vomiting, diarrhea or change in bowel or urinary habits, dysuria,hematuria, rash, arthralgias, visual complaints, headache, numbness weakness or ataxia.     Objective:   Physical Exam  Gen. Pleasant, obese, in no distress ENT - no lesions, no post nasal drip Neck: No JVD, no thyromegaly, no carotid bruits Lungs: no use of accessory muscles, no dullness to percussion, decreased without rales or rhonchi  Cardiovascular: Rhythm regular, heart sounds  normal, no murmurs or gallops, no peripheral edema Musculoskeletal: No deformities, no cyanosis or clubbing , no tremors       Assessment & Plan:    Chronic Obstructive Pulmonary Disease (COPD) with chronic hypoxic respiratory failure She has COPD with chronic hypoxic respiratory failure, requiring long-term oxygen therapy. Her oxygen saturation recently dropped to 86% in the emergency room, but she was discharged as she is on home oxygen. She reports increased use of albuterol, up to four times a day, and had a flare in February  treated with a steroid injection and antibiotics. Her lung function is borderline, with variable days. She uses a portable oxygen concentrator mainly at night or when her saturation drops below 88%. There is concern about maintaining qualification for a portable oxygen concentrator due to fluctuating oxygen levels. A walking test is needed to document oxygen desaturation to  qualify for portable oxygen. A night test may be required to maintain stationary concentrator qualification. - Refill Breztri at [facility] - Refill Singulair at [facility] - Refill albuterol if needed   Chronic resp failure with hypoxia  - Conduct a walking test to assess oxygen saturation levels > she did not qualify - Consider noct oximetry test to qualify for a stationary oxygen concentrator if needed

## 2023-06-01 NOTE — Patient Instructions (Addendum)
 Ambulatory sat  X refils on breztri, singulair

## 2023-06-07 ENCOUNTER — Ambulatory Visit: Attending: Cardiovascular Disease | Admitting: *Deleted

## 2023-06-07 ENCOUNTER — Encounter: Payer: Self-pay | Admitting: Cardiovascular Disease

## 2023-06-07 DIAGNOSIS — Z5181 Encounter for therapeutic drug level monitoring: Secondary | ICD-10-CM | POA: Diagnosis not present

## 2023-06-07 DIAGNOSIS — I4892 Unspecified atrial flutter: Secondary | ICD-10-CM | POA: Diagnosis not present

## 2023-06-07 LAB — POCT INR: INR: 2.7 (ref 2.0–3.0)

## 2023-06-07 NOTE — Patient Instructions (Signed)
 Continue taking 1 tablet daily except for 1/2 a tablet on Mondays, Wednesdays and Fridays. Recheck in 4 wks Call [phone] for questions or concerns.

## 2023-07-03 ENCOUNTER — Ambulatory Visit: Attending: Cardiovascular Disease | Admitting: *Deleted

## 2023-07-03 DIAGNOSIS — I4892 Unspecified atrial flutter: Secondary | ICD-10-CM

## 2023-07-03 DIAGNOSIS — Z5181 Encounter for therapeutic drug level monitoring: Secondary | ICD-10-CM | POA: Diagnosis present

## 2023-07-03 LAB — POCT INR: INR: 2.2 (ref 2.0–3.0)

## 2023-07-03 NOTE — Patient Instructions (Signed)
 Continue taking 1 tablet daily except for 1/2 a tablet on Mondays, Wednesdays and Fridays. Recheck in 6 wks Call [phone] for questions or concerns.

## 2023-08-09 ENCOUNTER — Encounter

## 2023-08-17 ENCOUNTER — Encounter

## 2023-08-21 ENCOUNTER — Ambulatory Visit: Attending: Cardiovascular Disease | Admitting: *Deleted

## 2023-08-21 DIAGNOSIS — Z5181 Encounter for therapeutic drug level monitoring: Secondary | ICD-10-CM | POA: Insufficient documentation

## 2023-08-21 DIAGNOSIS — I4892 Unspecified atrial flutter: Secondary | ICD-10-CM | POA: Insufficient documentation

## 2023-08-21 LAB — POCT INR: INR: 2.4 (ref 2.0–3.0)

## 2023-08-21 NOTE — Patient Instructions (Signed)
 Continue taking 1 tablet daily except for 1/2 a tablet on Mondays, Wednesdays and Fridays. Recheck in 6 wks Call [phone] for questions or concerns.

## 2023-09-11 ENCOUNTER — Ambulatory Visit (HOSPITAL_BASED_OUTPATIENT_CLINIC_OR_DEPARTMENT_OTHER): Payer: Self-pay | Admitting: Pulmonary Disease

## 2023-09-11 MED ORDER — PREDNISONE 10 MG PO TABS: ORAL_TABLET | ORAL | 0 refills | Status: DC

## 2023-09-11 MED ORDER — AZITHROMYCIN 250 MG PO TABS: ORAL_TABLET | ORAL | 0 refills | Status: DC

## 2023-09-11 NOTE — Telephone Encounter (Signed)
**Note De-identified  [place] Obfuscation** Please advise 

## 2023-09-11 NOTE — Telephone Encounter (Addendum)
 FYI Only or Action Required?: Action required by provider: requesting medications.  Patient is followed in Pulmonology for COPD, last seen on 06/01/2023 by Jude Harden GAILS, MD.  Called Nurse Triage reporting Cough and Shortness of Breath.  Symptoms began Thursday.  Interventions attempted: Rescue inhaler, Maintenance inhaler, and Home oxygen  use.  Symptoms are: unchanged.  Triage Disposition: See HCP Within 4 Hours (Or PCP Triage)  Patient/caregiver understands and will follow disposition?: No, wishes to speak with PCP  Copied from CRM [id number]. Topic: Clinical - Red Word Triage >> Sep 11, 2023  8:28 AM Leila C wrote: Red Word that prompted transfer to Nurse Triage: Patient's child Alan [phone] states patient coughing phlegm clear yellowish, hard to breath- shortness of breath, wheezing, and dizziness. Patient denies pain, nor fever. Patient thinks she may have COPD flare up or bronchitis. Patient sees Dr. Jude, please advise.   [facility] - [place], [place] -  [address] [place] [address] Phone: [phone] Fax: [phone] Reason for Disposition  [1] MILD difficulty breathing (e.g., minimal/no SOB at rest, SOB with walking, pulse < 100) AND [2] NEW-onset or WORSE than normal  Answer Assessment - Initial Assessment Questions 1. RESPIRATORY STATUS: Describe your breathing? (e.g., wheezing, shortness of breath, unable to speak, severe coughing)      Shortness of breath-patient reports with moving around coughing-yellowish and clear sputum and shortness of breath increases.  2. ONSET: When did this breathing problem begin?      Started Thursday.  3. PATTERN Does the difficult breathing come and go, or has it been constant since it started?      Some shortness of breath with no activity but shortness of breath worsens with activity.  4. SEVERITY: How bad is your breathing? (e.g., mild, moderate, severe)      Mild-moderate 5. RECURRENT SYMPTOM: Have you had  difficulty breathing before? If Yes, ask: When was the last time? and What happened that time?      February 6. CARDIAC HISTORY: Do you have any history of heart disease? (e.g., heart attack, angina, bypass surgery, angioplasty)      2 open heart surgeries 7. LUNG HISTORY: Do you have any history of lung disease?  (e.g., pulmonary embolus, asthma, emphysema)     COPD 8. CAUSE: What do you think is causing the breathing problem?      Possible COPD flare up 9. OTHER SYMPTOMS: Do you have any other symptoms? (e.g., chest pain, cough, dizziness, fever, runny nose)     Cough, dizziness when coughing becomes  10. O2 SATURATION MONITOR:  Do you use an oxygen  saturation monitor (pulse oximeter) at home? If Yes, ask: What is your reading (oxygen  level) today? What is your usual oxygen  saturation reading? (e.g., 95%)       Patient is unable to check oxygen  saturation due to work being done on their house.  12. TRAVEL: Have you traveled out of the country in the last month? (e.g., travel history, exposures)       No  Patient's daughter calling for patient but patient is next to her. Patient is asking for medication to be sent into her pharmacy due to symptoms. Patient is concerned for COPD flare up or bronchitis. Patient is asking for a phone call back today from office.  Protocols used: Breathing Difficulty-A-AH

## 2023-09-11 NOTE — Telephone Encounter (Signed)
Rx sent to pharmacy and daughter notified

## 2023-09-29 ENCOUNTER — Encounter (HOSPITAL_BASED_OUTPATIENT_CLINIC_OR_DEPARTMENT_OTHER): Payer: Self-pay | Admitting: Pulmonary Disease

## 2023-09-29 ENCOUNTER — Ambulatory Visit (HOSPITAL_BASED_OUTPATIENT_CLINIC_OR_DEPARTMENT_OTHER): Payer: Self-pay | Admitting: Pulmonary Disease

## 2023-09-29 ENCOUNTER — Ambulatory Visit
Admission: RE | Admit: 2023-09-29 | Discharge: 2023-09-29 | Disposition: A | Discharge status: Home / Self Care | Source: Ambulatory Visit | Attending: Family Medicine | Admitting: Family Medicine

## 2023-09-29 VITALS — BP 118/71 | HR 86 | Temp 98.7°F | Resp 18

## 2023-09-29 DIAGNOSIS — R0602 Shortness of breath: Secondary | ICD-10-CM | POA: Diagnosis not present

## 2023-09-29 NOTE — Telephone Encounter (Signed)
 FYI Only or Action Required?: FYI only for provider.  Patient is followed in Pulmonology for COPD, last seen on 06/01/2023 by Jude Harden GAILS, MD.  Called Nurse Triage reporting Shortness of Breath.  Symptoms began several weeks ago.  Interventions attempted: Maintenance inhaler, Nebulizer treatments, and Home oxygen  use.  Symptoms are: unchanged.  Triage Disposition: See HCP Within 4 Hours (Or PCP Triage)  Patient/caregiver understands and will follow disposition?: Yes      Copied from CRM [id number]. Topic: Clinical - Red Word Triage >> Sep 29, 2023  8:30 AM Isabell A wrote: [facility] that prompted transfer to Nurse Triage: Trouble breathing, SOB     Patient requesting to speak with Dr.Alva's nurse - states she has been struggling with her breathing and her Breztri  isnt helping much - would like to confirm if there is anything else she can take. Reason for Disposition  [1] MILD difficulty breathing (e.g., minimal/no SOB at rest, SOB with walking, pulse < 100) AND [2] NEW-onset or WORSE than normal  Answer Assessment - Initial Assessment Questions E2C2 Pulmonary Triage - Initial Assessment Questions Chief Complaint (e.g., cough, sob, wheezing, fever, chills, sweat or additional symptoms) *Go to specific symptom protocol after initial questions. SOB - all the time, brown productive cough Recently finished abx/steroid with minimal relief  How long have symptoms been present? A few months, worsening  Have you tested for COVID or Flu? Note: If not, ask patient if a home test can be taken. If so, instruct patient to call back for positive results. No  MEDICINES:   Have you used any OTC meds to help with symptoms? No If yes, ask What medications? N/a  Have you used your inhalers/maintenance medication? Yes If yes, What medications? Breztri  - 2 puffs twice daily Albuterol  NEB - last used last night - pt reports that it's no longer helping like it used to  [facility]ager  reviewed/reinforced albuterol  usage and SIG.    If inhaler, ask How many puffs and how often? Note: Review instructions on medication in the chart. See above  OXYGEN : Do you wear supplemental oxygen ? Yes If yes, How many liters are you supposed to use? 2L at night and PRN - pt endorses does not appreciate difference  Do you monitor your oxygen  levels? No If yes, What is your reading (oxygen  level) today? Unknown where device is  What is your usual oxygen  saturation reading?  (Note: Pulmonary O2 sats should be 90% or greater) unknown       1. RESPIRATORY STATUS: Describe your breathing? (e.g., wheezing, shortness of breath, unable to speak, severe coughing)      See above 2. ONSET: When did this breathing problem begin?      See above 3. PATTERN Does the difficult breathing come and go, or has it been constant since it started?      constant 4. SEVERITY: How bad is your breathing? (e.g., mild, moderate, severe)      Mild - moderate  Triager does appreciate audible SOB during call. Pt is speaking in partial-full sentences.  5. RECURRENT SYMPTOM: Have you had difficulty breathing before? If Yes, ask: When was the last time? and What happened that time?      Yes, recently finished abx/steroids 6. CARDIAC HISTORY: Do you have any history of heart disease? (e.g., heart attack, angina, bypass surgery, angioplasty)      CHF - endorses taking water pills, reports wt fluctuates and feels like Rx is working [employer] cardiac surgies x 2 7. LUNG  HISTORY: Do you have any history of lung disease?  (e.g., pulmonary embolus, asthma, emphysema)     Asthma , COPD 8. CAUSE: What do you think is causing the breathing problem?      unknown 9. OTHER SYMPTOMS: Do you have any other symptoms? (e.g., chest pain, cough, dizziness, fever, runny nose)     Productive brown cough, chest tightness Denies other sx above 10. O2 SATURATION MONITOR:  Do you use an oxygen   saturation monitor (pulse oximeter) at home? If Yes, ask: What is your reading (oxygen  level) today? What is your usual oxygen  saturation reading? (e.g., 95%)       N/a 11. PREGNANCY: Is there any chance you are pregnant? When was your last menstrual period?       N/a 12. TRAVEL: Have you traveled out of the country in the last month? (e.g., travel history, exposures)       N/a    Triager attempted to schedule with [facility], but no access. Scheduled with [facility] as alternative.  Protocols used: Breathing Difficulty-A-AH

## 2023-09-29 NOTE — ED Triage Notes (Signed)
 Pt reports she has been [facility] for a couple of weeks now. States she was given steroids and antibiotics a couple weeks ago however she has not gotten better. Pt is on 2 L of 02 at night. States it does not help her breathing.   Reports spitting up brown mucus .some chest tightness. SHOB worsens with movement

## 2023-09-29 NOTE — ED Notes (Signed)
 Patient is being discharged from the Urgent Care and sent to the Emergency Department via POV . Per NP, patient is in need of higher level of care due to shortness of breath. Patient is aware and verbalizes understanding of plan of care.  Vitals:   09/29/23 1300  BP: 118/71  Pulse: 86  Resp: 18  Temp: 98.7 F (37.1 C)  SpO2: 96%

## 2023-09-29 NOTE — Telephone Encounter (Signed)
 Duplicate I have sent pt a Mychart message to find out other symptoms

## 2023-09-29 NOTE — Discharge Instructions (Signed)
 Go to the emergency department for further evaluation.

## 2023-09-29 NOTE — ED Provider Notes (Signed)
 [facility]    CSN: [id number] Arrival date & time: 09/29/23  1253      History   Chief Complaint Chief Complaint  Patient presents with   Wheezing    SOB - Entered by patient    HPI Lindsay Bonilla is a 63 y.o. female.   The history is provided by the patient and a relative.   Patient presents for complaints of shortness of breath and difficulty breathing for the past several days.  Patient states she was prescribed an antibiotic and prednisone  by her pulmonologist but symptoms have not improved.  Patient states she is short of breath both at rest and with exertion.  Patient with underlying history of COPD and atrial flutter.  Patient states that she thought this was her COPD, but she feels that it should have improved with the medications prescribed by her pulmonologist.  Patient denies chest pain, dizziness, lightheadedness, abdominal pain, nausea, vomiting, or diarrhea.  Patient states that she also uses a nebulizer treatment at home with minimal relief of her symptoms. Past Medical History:  Diagnosis Date   Anxiety    Aortic aneurysm (HCC)    Aortic insufficiency    a. s/p [place] with mechanical AVR 4/17 >> FU Echo 6/17: Mild LVH, EF 60-65%, normal wall motion, ventricular septum with paradoxical septal motion, [employer] mechanical AVR functioning  normally with no regurgitation (mean 6 mmHg), MAC, trivial MR, mild RVE, trivial TR   Aortic stenosis, severe    Bicuspid valve   Arthritis    Asthma    Atrial flutter (HCC)    Bitten or stung by nonvenomous insect and other nonvenomous arthropods, initial encounter    Bronchitis    Chronic diastolic CHF (congestive heart failure) (HCC)    Chronic respiratory failure with hypoxia (HCC)    COPD (chronic obstructive pulmonary disease) (HCC)    DDD (degenerative disc disease), cervical    Depression    Endocarditis, valve unspecified    GERD (gastroesophageal reflux disease)    Hard of hearing    Heart failure  (HCC)    Heart failure, unspecified (HCC)    Hemorrhage, not elsewhere classified    History of pneumonia    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   Hyperkalemia    Low back pain    Migraine headache    Mild CAD    Other chronic pain    Palpitations    PAT (paroxysmal atrial tachycardia) (HCC)    PONV (postoperative nausea and vomiting)    only once   Premature atrial contractions    PVC's (premature ventricular contractions)    Shortness of breath    Nodule left lung CT done 8/6   Sinus bradycardia    Stress incontinence    Unspecified osteoarthritis, unspecified site    Valvular heart disease     Patient Active Problem List   Diagnosis Date Noted   Allergic rhinitis due to pollen 11/01/2022   Allergic rhinitis due to animal dander 11/01/2022   Anxiety 11/01/2022   Eustachian tube dysfunction 11/01/2022   OSA (obstructive sleep apnea) 08/11/2022   Chronic cough 01/11/2022   Allergy to environmental factors 08/19/2020   Gastroesophageal reflux disease 08/19/2020   Hypertensive disorder 08/19/2020   Acute on chronic heart failure with preserved ejection fraction (HFpEF) (HCC) 05/13/2019   Anticoagulated on Coumadin  05/13/2019   Endocarditis, valve unspecified    Heart failure (HCC)    Bitten or stung by nonvenomous insect and  other nonvenomous arthropods, initial encounter    Chronic respiratory failure with hypoxia (HCC)    Heart failure, unspecified (HCC)    Hemorrhage, not elsewhere classified    Hyperkalemia    Low back pain    Other chronic pain    Unspecified osteoarthritis, unspecified site    Trochanteric bursitis of right hip 11/03/2017   Opiate overdose (HCC) 09/30/2017   Depression with anxiety 09/30/2017   Right foot injury 09/30/2017   Chronic pain syndrome 09/30/2017   Closed nondisplaced fracture of metatarsal bone of right foot    Cervical spondylosis 05/26/2017   Chronic diastolic CHF (congestive heart failure) (HCC)  05/05/2016   Hyperlipidemia 07/10/2015   Encounter for therapeutic drug monitoring 06/25/2015   Aortic valve replaced 06/25/2015   Atrial flutter (HCC) 06/21/2015   S/P aortic valve replacement 06/15/2015   S/P aortic valve replacement and aortoplasty 06/15/2015   Aortic valve disease    CAD (coronary artery disease), native coronary artery 08/29/2012   TOBACCO USER 02/25/2009   Aneurysm of thoracic aorta (HCC) 02/25/2009   DYSPNEA ON EXERTION 08/29/2008   MIGRAINE HEADACHE 08/21/2008   VALVULAR HEART DISEASE 08/21/2008   BRONCHITIS 08/21/2008   ASTHMA 08/21/2008   COPD, very severe (HCC) 08/21/2008   ARTHRITIS 08/21/2008   PALPITATIONS 08/21/2008    Past Surgical History:  Procedure Laterality Date   ABDOMINAL HYSTERECTOMY     ANTERIOR CERVICAL DECOMP/DISCECTOMY FUSION  10/05/2011   Procedure: ANTERIOR CERVICAL DECOMPRESSION/DISCECTOMY FUSION 2 LEVELS;  Surgeon: Catalina CHRISTELLA Stains, MD;  Location: [facility];  Service: Neurosurgery;  Laterality: N/A;  Cervical five - six , six - seven Anterior cervical decompression/diskectomy/fusion/plate   ANTERIOR CRUCIATE LIGAMENT REPAIR Right    AORTIC VALVE REPLACEMENT     ARTHROSCOPIC REPAIR ACL  rt   BENTALL PROCEDURE N/A 06/15/2015   Procedure: BENTALL PROCEDURE; HEMI-ARCH REPAIR WITH #23 ST JUDE MECHANICAL AVR CONDUIT AND #28 HEMASHIELD PLATINUM GRAFT;  Surgeon: Dorise MARLA Fellers, MD;  Location: [facility] OR;  Service: Open Heart Surgery;  Laterality: N/A;   CARDIAC CATHETERIZATION N/A 05/21/2015   Procedure: Right/Left Heart Cath and Coronary Angiography;  Surgeon: Lonni JONETTA Cash, MD;  Location: [facility] INVASIVE CV LAB;  Service: Cardiovascular;  Laterality: N/A;   CHOLECYSTECTOMY     COLONOSCOPY  10/13/05   COLONOSCOPY WITH PROPOFOL  N/A 07/22/2021   Procedure: COLONOSCOPY WITH PROPOFOL ;  Surgeon: Federico Rosario BROCKS, MD;  Location: [facility];  Service: Gastroenterology;  Laterality: N/A;   DIAGNOSTIC LAPAROSCOPY     HEMOSTASIS CLIP PLACEMENT   07/22/2021   Procedure: HEMOSTASIS CLIP PLACEMENT;  Surgeon: Federico Rosario BROCKS, MD;  Location: [facility];  Service: Gastroenterology;;   JOINT REPLACEMENT Right 2016   KNEE ARTHROSCOPY     rt x4  x1 lft   MYRINGOPLASTY W/ FAT GRAFT     graft from behind ear   POLYPECTOMY  07/22/2021   Procedure: POLYPECTOMY;  Surgeon: Federico Rosario BROCKS, MD;  Location: [facility];  Service: Gastroenterology;;   [place]  10/13/05, 09/19/05   TEE WITHOUT CARDIOVERSION N/A 06/15/2015   Procedure: TRANSESOPHAGEAL ECHOCARDIOGRAM (TEE);  Surgeon: Dorise MARLA Fellers, MD;  Location: [facility];  Service: Open Heart Surgery;  Laterality: N/A;   TONSILLECTOMY  2005   TUBAL LIGATION     TYMPANOSTOMY TUBE PLACEMENT      OB History     Gravida      Para      Term      Preterm      AB  Living  3      SAB      IAB      Ectopic      Multiple      Live Births               Home Medications    Prior to Admission medications   Medication Sig Start Date End Date Taking? Authorizing Provider  albuterol  (PROVENTIL ) (2.5 MG/3ML) 0.083% nebulizer solution Take 3 mLs (2.5 mg total) by nebulization every 4 (four) hours as needed for wheezing or shortness of breath. 06/01/23   Jude Harden GAILS, MD  albuterol  (VENTOLIN  HFA) 108 (90 Base) MCG/ACT inhaler Inhale 2 puffs into the lungs every 6 (six) hours as needed for wheezing or shortness of breath. 06/01/23   Jude Harden GAILS, MD  amoxicillin  (AMOXIL ) 500 MG capsule TAKE 4 TABLETS BY MOUTH 30-60 MINUTES PRIOR TO DENTAL PROCEDURES Patient not taking: Reported on 06/01/2023 02/11/22   Verlin Lonni BIRCH, MD  Ascorbic Acid (VITAMIN C ER PO) Take 2,000 mg by mouth daily.    [provider]  aspirin  EC 81 MG tablet Take 1 tablet (81 mg total) by mouth daily. 07/23/15   Lelon Hamilton T, PA-C  azithromycin  (ZITHROMAX ) 250 MG tablet Take first 2 tablets together, then 1 every day until finished. Patient not taking: Reported on 06/01/2023 04/07/23   Stuart Vernell Norris, PA-C  azithromycin  (ZITHROMAX ) 250 MG tablet Take first 2 tablets together, then 1 every day until finished. 09/11/23   Jude Harden GAILS, MD  budeson-glycopyrrolate -formoterol  (BREZTRI  AEROSPHERE) 160-9-4.8 MCG/ACT AERO Inhale 2 puffs into the lungs in the morning and at bedtime. 06/01/23   Jude Harden GAILS, MD  chlorpheniramine (CHLOR-TRIMETON) 4 MG tablet Take 4 mg by mouth See admin instructions. Take 4 mg in the morning and 8 mg at bedtime    [provider]  Cholecalciferol (VITAMIN D) 125 MCG (5000 UT) CAPS Take 5,000 Units by mouth daily.    [provider]  EPINEPHrine  (EPIPEN ) 0.3 mg/0.3 mL IJ SOAJ injection Inject 0.3 mg into the muscle as needed for anaphylaxis. For allergic reaction    [provider]  furosemide  (LASIX ) 40 MG tablet Take 1 tablet (40 mg total) by mouth 2 (two) times daily. 11/02/22   Verlin Lonni BIRCH, MD  gabapentin  (NEURONTIN ) 600 MG tablet Take 600 mg by mouth 2 (two) times daily. 07/01/21   [provider]  linaclotide  (LINZESS ) 145 MCG CAPS capsule Take 145 mcg by mouth every morning. 09/08/20   [provider]  losartan  (COZAAR ) 25 MG tablet take 1 tablet once daily. 12/01/22   Verlin Lonni BIRCH, MD  magnesium  oxide (MAG-OX) 400 MG tablet Take 1 tablet (400 mg total) by mouth daily. 12/31/18   Dunn, Dayna N, PA-C  metolazone  (ZAROXOLYN ) 2.5 MG tablet take 1 tablet by mouth on monday, wednesday and friday. 12/01/22   Verlin Lonni BIRCH, MD  montelukast  (SINGULAIR ) 10 MG tablet Take 1 tablet (10 mg total) by mouth at bedtime. 06/01/23   Jude Harden GAILS, MD  naloxone  (NARCAN ) nasal spray 4 mg/0.1 mL Place 1 spray into the nose once.    [provider]  omeprazole  (PRILOSEC) 40 MG capsule Take 40 mg by mouth daily.    [provider]  Oxycodone  HCl 10 MG TABS     [provider]  potassium chloride  (KLOR-CON  M) 10 MEQ tablet Take 4 tablets (40 mEq total) by mouth 2 (two) times daily.  11/02/22   Verlin Lonni  D, MD  predniSONE  (DELTASONE ) 10 MG tablet 4 tabs daily with food x 4 days, 3 tabs daily with food x 4 days, 2 tabs daily with food x 4 days, 1 tab daily with food x 4 days 09/11/23   Jude Harden GAILS, MD  promethazine  (PHENERGAN ) 25 MG tablet Take 1 tablet (25 mg total) by mouth every 8 (eight) hours as needed for nausea or vomiting. 01/04/23   Stuart Vernell Norris, PA-C  rosuvastatin  (CRESTOR ) 20 MG tablet Take 1 tablet (20 mg total) by mouth daily. 11/14/22   Verlin Lonni BIRCH, MD  traZODone  (DESYREL ) 150 MG tablet Take 150 mg by mouth at bedtime.    [provider]  warfarin (COUMADIN ) 5 MG tablet take one-half (1/2) tablet to 1 tablet by mouth daily or as directed by the anticoagulation clinic. Patient taking differently: Take ONE-HALF (1/2) tablet M,W,F to 1 tablet the rest of the week by mouth daily or as directed by the Anticoagulation Clinic. 06/01/23   Verlin Lonni BIRCH, MD    Family History Family History  Problem Relation Age of Onset   Cancer Mother        Type unknown-? lung   Heart attack Father    Asthma Brother    Colon cancer Brother        dx at age 62   Esophageal cancer Maternal Grandfather        smoker   Diabetes Other        family history   Hyperlipidemia Other        family history    Social History Social History   Tobacco Use   Smoking status: Former    Current packs/day: 0.00    Average packs/day: 0.1 packs/day for 30.0 years (3.0 ttl pk-yrs)    Types: Cigarettes    Start date: 04/28/1985    Quit date: 04/29/2015    Years since quitting: 8.4   Smokeless tobacco: Never  Vaping Use   Vaping status: Never Used  Substance Use Topics   Alcohol use: No    Alcohol/week: 0.0 standard drinks of alcohol   Drug use: No     Allergies   Alpha-gal, Beta adrenergic blockers, Doxycycline , Vancomycin , Avelox [moxifloxacin hcl in nacl], Ciprofloxacin, Cleocin [clindamycin hcl], Moxifloxacin,  Sulfamethoxazole-trimethoprim, and Tape   Review of Systems Review of Systems Per HPI  Physical Exam Triage Vital Signs ED Triage Vitals  Encounter Vitals Group     BP 09/29/23 1300 118/71     Girls Systolic BP Percentile --      Girls Diastolic BP Percentile --      Boys Systolic BP Percentile --      Boys Diastolic BP Percentile --      Pulse Rate 09/29/23 1300 86     Resp 09/29/23 1300 18     Temp 09/29/23 1300 98.7 F (37.1 C)     Temp Source 09/29/23 1300 Oral     SpO2 09/29/23 1300 96 %     Weight --      Height --      Head Circumference --      Peak Flow --      Pain Score 09/29/23 1304 0     Pain Loc --      Pain Education --      Exclude from Growth Chart --    No data found.  Updated Vital Signs BP 118/71 (BP Location: Right Arm)   Pulse 86   Temp 98.7 F (37.1 C) (  Oral)   Resp 18   SpO2 96%   Visual Acuity Right Eye Distance:   Left Eye Distance:   Bilateral Distance:    Right Eye Near:   Left Eye Near:    Bilateral Near:     Physical Exam Vitals and nursing note reviewed.  Constitutional:      General: She is not in acute distress.    Appearance: Normal appearance.  HENT:     Head: Normocephalic.  Eyes:     Extraocular Movements: Extraocular movements intact.     Pupils: Pupils are equal, round, and reactive to light.  Cardiovascular:     Rate and Rhythm: Normal rate and regular rhythm.     Pulses: Normal pulses.     Heart sounds: Normal heart sounds.  Pulmonary:     Breath sounds: Normal breath sounds. No stridor. No wheezing or rales.     Comments: Increased work of breathing noted with tripoding Abdominal:     General: Bowel sounds are normal.     Palpations: Abdomen is soft.  Musculoskeletal:     Cervical back: Normal range of motion.  Skin:    General: Skin is warm and dry.  Neurological:     General: No focal deficit present.     Mental Status: She is alert and oriented to person, place, and time.  Psychiatric:         Mood and Affect: Mood normal.        Behavior: Behavior normal.      UC Treatments / Results  Labs (all labs ordered are listed, but only abnormal results are displayed) Labs Reviewed - No data to display  EKG   Radiology No results found.  Procedures Procedures (including critical care time)  Medications Ordered in UC Medications - No data to display  Initial Impression / Assessment and Plan / UC Course  I have reviewed the triage vital signs and the nursing notes.  Pertinent labs & imaging results that were available during my care of the patient were reviewed by me and considered in my medical decision making (see chart for details).  Patient with difficulty breathing and shortness of breath for the past several days.  Patient has completed rounds of prednisone  along with an antibiotic prescribed by her pulmonologist with no relief of symptoms.  Patient was advised this location does not have imaging available, given the patient's past medical history and current symptoms, patient was advised it is recommended that she go to the emergency department for further evaluation.  Patient's vital signs are stable at this time.  Patient states I am not going to the hospital.  Discussed further recommendations and rationale as to why patient should be seen in the emergency department due to the limitations of the urgent care environment.  Daughter was in agreement with this plan of care and verbalized understanding.  All questions were answered.  Patient discharged to the emergency department via private vehicle.   Final Clinical Impressions(s) / UC Diagnoses   Final diagnoses:  Shortness of breath     Discharge Instructions      Go to the emergency department for further evaluation.    ED Prescriptions   None    PDMP not reviewed this encounter.   Gilmer Etta PARAS, NP 10/01/23 [phone]

## 2023-10-02 ENCOUNTER — Ambulatory Visit: Attending: Cardiovascular Disease | Admitting: *Deleted

## 2023-10-02 DIAGNOSIS — Z5181 Encounter for therapeutic drug level monitoring: Secondary | ICD-10-CM | POA: Insufficient documentation

## 2023-10-02 DIAGNOSIS — I4892 Unspecified atrial flutter: Secondary | ICD-10-CM | POA: Insufficient documentation

## 2023-10-02 LAB — POCT INR: INR: 2.5 (ref 2.0–3.0)

## 2023-10-02 NOTE — Patient Instructions (Signed)
 Continue taking 1 tablet daily except for 1/2 a tablet on Mondays, Wednesdays and Fridays. Recheck in 6 wks Call [phone] for questions or concerns.

## 2023-10-02 NOTE — Progress Notes (Signed)
 INR 2.5. Please see anticoagulation encounter

## 2023-10-07 ENCOUNTER — Encounter (HOSPITAL_COMMUNITY): Payer: Self-pay

## 2023-10-07 ENCOUNTER — Emergency Department (HOSPITAL_COMMUNITY)

## 2023-10-07 ENCOUNTER — Inpatient Hospital Stay (HOSPITAL_COMMUNITY)
Admission: EM | Admit: 2023-10-07 | Discharge: 2023-10-10 | DRG: 193 | Disposition: A | Discharge status: Home / Self Care | Attending: Family Medicine | Admitting: Family Medicine

## 2023-10-07 ENCOUNTER — Other Ambulatory Visit: Payer: Self-pay

## 2023-10-07 DIAGNOSIS — J159 Unspecified bacterial pneumonia: Principal | ICD-10-CM | POA: Diagnosis present

## 2023-10-07 DIAGNOSIS — Z881 Allergy status to other antibiotic agents status: Secondary | ICD-10-CM | POA: Diagnosis not present

## 2023-10-07 DIAGNOSIS — E876 Hypokalemia: Secondary | ICD-10-CM | POA: Diagnosis present

## 2023-10-07 DIAGNOSIS — R0602 Shortness of breath: Secondary | ICD-10-CM | POA: Diagnosis present

## 2023-10-07 DIAGNOSIS — Z833 Family history of diabetes mellitus: Secondary | ICD-10-CM

## 2023-10-07 DIAGNOSIS — I110 Hypertensive heart disease with heart failure: Secondary | ICD-10-CM | POA: Diagnosis present

## 2023-10-07 DIAGNOSIS — E66812 Obesity, class 2: Secondary | ICD-10-CM | POA: Diagnosis present

## 2023-10-07 DIAGNOSIS — Z952 Presence of prosthetic heart valve: Secondary | ICD-10-CM

## 2023-10-07 DIAGNOSIS — E785 Hyperlipidemia, unspecified: Secondary | ICD-10-CM | POA: Diagnosis present

## 2023-10-07 DIAGNOSIS — J9621 Acute and chronic respiratory failure with hypoxia: Secondary | ICD-10-CM | POA: Diagnosis present

## 2023-10-07 DIAGNOSIS — Z7951 Long term (current) use of inhaled steroids: Secondary | ICD-10-CM

## 2023-10-07 DIAGNOSIS — I5033 Acute on chronic diastolic (congestive) heart failure: Secondary | ICD-10-CM

## 2023-10-07 DIAGNOSIS — J189 Pneumonia, unspecified organism: Secondary | ICD-10-CM | POA: Diagnosis present

## 2023-10-07 DIAGNOSIS — Z882 Allergy status to sulfonamides status: Secondary | ICD-10-CM

## 2023-10-07 DIAGNOSIS — Z8249 Family history of ischemic heart disease and other diseases of the circulatory system: Secondary | ICD-10-CM

## 2023-10-07 DIAGNOSIS — K219 Gastro-esophageal reflux disease without esophagitis: Secondary | ICD-10-CM | POA: Diagnosis present

## 2023-10-07 DIAGNOSIS — J440 Chronic obstructive pulmonary disease with acute lower respiratory infection: Secondary | ICD-10-CM | POA: Diagnosis present

## 2023-10-07 DIAGNOSIS — Z7982 Long term (current) use of aspirin: Secondary | ICD-10-CM

## 2023-10-07 DIAGNOSIS — Z6835 Body mass index (BMI) 35.0-35.9, adult: Secondary | ICD-10-CM | POA: Diagnosis not present

## 2023-10-07 DIAGNOSIS — G4700 Insomnia, unspecified: Secondary | ICD-10-CM | POA: Diagnosis present

## 2023-10-07 DIAGNOSIS — Z7901 Long term (current) use of anticoagulants: Secondary | ICD-10-CM | POA: Diagnosis not present

## 2023-10-07 DIAGNOSIS — J9601 Acute respiratory failure with hypoxia: Principal | ICD-10-CM

## 2023-10-07 DIAGNOSIS — Z888 Allergy status to other drugs, medicaments and biological substances status: Secondary | ICD-10-CM

## 2023-10-07 DIAGNOSIS — I719 Aortic aneurysm of unspecified site, without rupture: Secondary | ICD-10-CM | POA: Diagnosis present

## 2023-10-07 DIAGNOSIS — Z800 Family history of malignant neoplasm of digestive organs: Secondary | ICD-10-CM

## 2023-10-07 DIAGNOSIS — J441 Chronic obstructive pulmonary disease with (acute) exacerbation: Secondary | ICD-10-CM | POA: Diagnosis present

## 2023-10-07 DIAGNOSIS — I5032 Chronic diastolic (congestive) heart failure: Secondary | ICD-10-CM | POA: Diagnosis present

## 2023-10-07 DIAGNOSIS — Z9049 Acquired absence of other specified parts of digestive tract: Secondary | ICD-10-CM

## 2023-10-07 DIAGNOSIS — Z1152 Encounter for screening for COVID-19: Secondary | ICD-10-CM

## 2023-10-07 DIAGNOSIS — I2510 Atherosclerotic heart disease of native coronary artery without angina pectoris: Secondary | ICD-10-CM | POA: Diagnosis present

## 2023-10-07 DIAGNOSIS — Z79899 Other long term (current) drug therapy: Secondary | ICD-10-CM

## 2023-10-07 DIAGNOSIS — Z87891 Personal history of nicotine dependence: Secondary | ICD-10-CM | POA: Diagnosis not present

## 2023-10-07 DIAGNOSIS — J449 Chronic obstructive pulmonary disease, unspecified: Secondary | ICD-10-CM

## 2023-10-07 DIAGNOSIS — Z825 Family history of asthma and other chronic lower respiratory diseases: Secondary | ICD-10-CM

## 2023-10-07 DIAGNOSIS — I4892 Unspecified atrial flutter: Secondary | ICD-10-CM

## 2023-10-07 DIAGNOSIS — Z981 Arthrodesis status: Secondary | ICD-10-CM

## 2023-10-07 LAB — CBC WITH DIFFERENTIAL/PLATELET
Abs Immature Granulocytes: 0.03 K/uL (ref 0.00–0.07)
Basophils Absolute: 0 K/uL (ref 0.0–0.1)
Basophils Relative: 0 %
Eosinophils Absolute: 0.2 K/uL (ref 0.0–0.5)
Eosinophils Relative: 3 %
HCT: 40.2 % (ref 36.0–46.0)
Hemoglobin: 13.5 g/dL (ref 12.0–15.0)
Immature Granulocytes: 0 %
Lymphocytes Relative: 22 %
Lymphs Abs: 1.6 K/uL (ref 0.7–4.0)
MCH: 29.9 pg (ref 26.0–34.0)
MCHC: 33.6 g/dL (ref 30.0–36.0)
MCV: 88.9 fL (ref 80.0–100.0)
Monocytes Absolute: 0.6 K/uL (ref 0.1–1.0)
Monocytes Relative: 8 %
Neutro Abs: 4.9 K/uL (ref 1.7–7.7)
Neutrophils Relative %: 67 %
Platelets: 239 K/uL (ref 150–400)
RBC: 4.52 MIL/uL (ref 3.87–5.11)
RDW: 13.4 % (ref 11.5–15.5)
WBC: 7.3 K/uL (ref 4.0–10.5)
nRBC: 0 % (ref 0.0–0.2)

## 2023-10-07 LAB — COMPREHENSIVE METABOLIC PANEL WITH GFR
ALT: 28 U/L (ref 0–44)
AST: 28 U/L (ref 15–41)
Albumin: 3.6 g/dL (ref 3.5–5.0)
Alkaline Phosphatase: 58 U/L (ref 38–126)
Anion gap: 12 (ref 5–15)
BUN: 15 mg/dL (ref 8–23)
CO2: 31 mmol/L (ref 22–32)
Calcium: 8.6 mg/dL — ABNORMAL LOW (ref 8.9–10.3)
Chloride: 94 mmol/L — ABNORMAL LOW (ref 98–111)
Creatinine, Ser: 0.68 mg/dL (ref 0.44–1.00)
GFR, Estimated: >60 mL/min (ref >60)
Glucose, Bld: 106 mg/dL — ABNORMAL HIGH (ref 70–99)
Potassium: 3.1 mmol/L — ABNORMAL LOW (ref 3.5–5.1)
Sodium: 137 mmol/L (ref 135–145)
Total Bilirubin: 0.7 mg/dL (ref 0.0–1.2)
Total Protein: 6.4 g/dL — ABNORMAL LOW (ref 6.5–8.1)

## 2023-10-07 LAB — BLOOD GAS, VENOUS
Acid-Base Excess: 12.3 mmol/L — ABNORMAL HIGH (ref 0.0–2.0)
Bicarbonate: 39.2 mmol/L — ABNORMAL HIGH (ref 20.0–28.0)
Drawn by: 1517
O2 Saturation: 66.5 %
Patient temperature: 37.1
pCO2, Ven: 59 mmHg (ref 44–60)
pH, Ven: 7.43 (ref 7.25–7.43)
pO2, Ven: 37 mmHg (ref 32–45)

## 2023-10-07 LAB — PROTIME-INR
INR: 1.8 — ABNORMAL HIGH (ref 0.8–1.2)
Prothrombin Time: 22.2 s — ABNORMAL HIGH (ref 11.4–15.2)

## 2023-10-07 LAB — MAGNESIUM: Magnesium: 1.9 mg/dL (ref 1.7–2.4)

## 2023-10-07 LAB — SARS CORONAVIRUS 2 BY RT PCR: SARS Coronavirus 2 by RT PCR: NEGATIVE

## 2023-10-07 LAB — BRAIN NATRIURETIC PEPTIDE: B Natriuretic Peptide: 70 pg/mL (ref 0.0–100.0)

## 2023-10-07 MED ORDER — WARFARIN SODIUM 5 MG PO TABS: 5.0000 mg | ORAL_TABLET | Freq: Once | ORAL | Status: DC

## 2023-10-07 MED ORDER — ROSUVASTATIN CALCIUM 20 MG PO TABS
20.0000 mg | ORAL_TABLET | Freq: Every day | ORAL | Status: DC
  Administered 2023-10-08 – 2023-10-10 (×6): 20 mg via ORAL
  Filled 2023-10-07 (×4): qty 1

## 2023-10-07 MED ORDER — ONDANSETRON HCL 4 MG PO TABS: 4.0000 mg | ORAL_TABLET | Freq: Four times a day (QID) | ORAL | Status: DC | PRN

## 2023-10-07 MED ORDER — METHYLPREDNISOLONE SODIUM SUCC 125 MG IJ SOLR
125.0000 mg | Freq: Once | INTRAMUSCULAR | Status: AC
  Administered 2023-10-07: 125 mg via INTRAVENOUS
  Filled 2023-10-07: qty 2

## 2023-10-07 MED ORDER — GABAPENTIN 300 MG PO CAPS
600.0000 mg | ORAL_CAPSULE | Freq: Two times a day (BID) | ORAL | Status: DC
  Administered 2023-10-08 – 2023-10-10 (×9): 600 mg via ORAL
  Filled 2023-10-07 (×6): qty 2

## 2023-10-07 MED ORDER — ONDANSETRON HCL 4 MG/2ML IJ SOLN: 4.0000 mg | Freq: Four times a day (QID) | INTRAMUSCULAR | Status: DC | PRN

## 2023-10-07 MED ORDER — ACETAMINOPHEN 325 MG PO TABS: 650.0000 mg | ORAL_TABLET | Freq: Four times a day (QID) | ORAL | Status: DC | PRN

## 2023-10-07 MED ORDER — BUDESONIDE 0.25 MG/2ML IN SUSP
0.2500 mg | Freq: Two times a day (BID) | RESPIRATORY_TRACT | Status: DC
  Administered 2023-10-08 – 2023-10-10 (×8): 0.25 mg via RESPIRATORY_TRACT
  Filled 2023-10-07 (×4): qty 2

## 2023-10-07 MED ORDER — WARFARIN - PHARMACIST DOSING INPATIENT: Freq: Every day | Status: DC

## 2023-10-07 MED ORDER — ACETAMINOPHEN 650 MG RE SUPP: 650.0000 mg | Freq: Four times a day (QID) | RECTAL | Status: DC | PRN

## 2023-10-07 MED ORDER — WARFARIN SODIUM 2.5 MG PO TABS
7.5000 mg | ORAL_TABLET | Freq: Once | ORAL | Status: AC
  Administered 2023-10-07: 7.5 mg via ORAL
  Filled 2023-10-07: qty 3

## 2023-10-07 MED ORDER — PREDNISONE 20 MG PO TABS: 50.0000 mg | ORAL_TABLET | Freq: Every day | ORAL | Status: DC

## 2023-10-07 MED ORDER — ALBUTEROL SULFATE (2.5 MG/3ML) 0.083% IN NEBU
10.0000 mg/h | INHALATION_SOLUTION | Freq: Once | RESPIRATORY_TRACT | Status: AC
  Administered 2023-10-07: 10 mg/h via RESPIRATORY_TRACT
  Filled 2023-10-07: qty 12

## 2023-10-07 MED ORDER — TRAZODONE HCL 50 MG PO TABS
150.0000 mg | ORAL_TABLET | Freq: Every day | ORAL | Status: DC
  Administered 2023-10-08 – 2023-10-09 (×4): 150 mg via ORAL
  Filled 2023-10-07 (×3): qty 3

## 2023-10-07 MED ORDER — MAGNESIUM SULFATE 2 GM/50ML IV SOLN
2.0000 g | Freq: Once | INTRAVENOUS | Status: AC
  Administered 2023-10-07: 2 g via INTRAVENOUS
  Filled 2023-10-07: qty 50

## 2023-10-07 MED ORDER — IPRATROPIUM-ALBUTEROL 0.5-2.5 (3) MG/3ML IN SOLN
3.0000 mL | Freq: Four times a day (QID) | RESPIRATORY_TRACT | Status: DC
  Administered 2023-10-08: 3 mL via RESPIRATORY_TRACT
  Filled 2023-10-07: qty 3

## 2023-10-07 MED ORDER — PANTOPRAZOLE SODIUM 40 MG PO TBEC
40.0000 mg | DELAYED_RELEASE_TABLET | Freq: Every day | ORAL | Status: DC
  Administered 2023-10-08 – 2023-10-10 (×5): 40 mg via ORAL
  Filled 2023-10-07 (×3): qty 1

## 2023-10-07 MED ORDER — ASPIRIN 81 MG PO TBEC
81.0000 mg | DELAYED_RELEASE_TABLET | Freq: Every day | ORAL | Status: DC
  Administered 2023-10-08 – 2023-10-10 (×5): 81 mg via ORAL
  Filled 2023-10-07 (×3): qty 1

## 2023-10-07 MED ORDER — ARFORMOTEROL TARTRATE 15 MCG/2ML IN NEBU
15.0000 ug | INHALATION_SOLUTION | Freq: Two times a day (BID) | RESPIRATORY_TRACT | Status: DC
  Administered 2023-10-08 – 2023-10-10 (×8): 15 ug via RESPIRATORY_TRACT
  Filled 2023-10-07 (×5): qty 2

## 2023-10-07 MED ORDER — LOSARTAN POTASSIUM 25 MG PO TABS
25.0000 mg | ORAL_TABLET | Freq: Every day | ORAL | Status: DC
  Administered 2023-10-08 – 2023-10-10 (×5): 25 mg via ORAL
  Filled 2023-10-07 (×3): qty 1

## 2023-10-07 MED ORDER — AZITHROMYCIN 250 MG PO TABS
500.0000 mg | ORAL_TABLET | Freq: Every day | ORAL | Status: DC
  Administered 2023-10-07 – 2023-10-10 (×6): 500 mg via ORAL
  Filled 2023-10-07 (×4): qty 2

## 2023-10-07 NOTE — Progress Notes (Signed)
 PHARMACY - ANTICOAGULATION CONSULT NOTE  Pharmacy Consult for Coumadin  Indication: atrial fibrillation  Allergies  Allergen Reactions   Alpha-Gal     Red meat   Beta Adrenergic Blockers Anaphylaxis   Doxycycline  Other (See Comments)    Swelling, dizziness, sleepy, talking out of my head, almost caused liver failure   Vancomycin  Swelling    Rash and severe swelling   Avelox [Moxifloxacin Hcl In Nacl] Rash   Ciprofloxacin Nausea And Vomiting   Cleocin [Clindamycin Hcl] Rash   Moxifloxacin Rash   Sulfamethoxazole-Trimethoprim Rash   Tape Other (See Comments)    White clear itches, rash ONLY use paper tape    Patient Measurements: Height: 5' 2 (157.5 cm) Weight: 83.9 kg (185 lb) IBW/kg (Calculated) : 50.1 HEPARIN  DW (KG): 69  Vital Signs: Temp: 98.3 F (36.8 C) (08/09 2100) Temp Source: Oral (08/09 2100) BP: 128/75 (08/09 2300) Pulse Rate: 79 (08/09 2300)  Labs: Recent Labs    10/07/23 2127  HGB 13.5  HCT 40.2  PLT 239  LABPROT 22.2*  INR 1.8*  CREATININE 0.68    Estimated Creatinine Clearance: 73.2 mL/min (by C-G formula based on SCr of 0.68 mg/dL).   Medical History: Past Medical History:  Diagnosis Date   Anxiety    Aortic aneurysm (HCC)    Aortic insufficiency    a. s/p Bentall with mechanical AVR 4/17 >> FU Echo 6/17: Mild LVH, EF 60-65%, normal wall motion, ventricular septum with paradoxical septal motion, St. Jude mechanical AVR functioning  normally with no regurgitation (mean 6 mmHg), MAC, trivial MR, mild RVE, trivial TR   Aortic stenosis, severe    Bicuspid valve   Arthritis    Asthma    Atrial flutter (HCC)    Bitten or stung by nonvenomous insect and other nonvenomous arthropods, initial encounter    Bronchitis    Chronic diastolic CHF (congestive heart failure) (HCC)    Chronic respiratory failure with hypoxia (HCC)    COPD (chronic obstructive pulmonary disease) (HCC)    DDD (degenerative disc disease), cervical    Depression     Endocarditis, valve unspecified    GERD (gastroesophageal reflux disease)    Hard of hearing    Heart failure (HCC)    Heart failure, unspecified (HCC)    Hemorrhage, not elsewhere classified    History of pneumonia    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   Current Outpatient Medications on File Prior to Encounter  Medication Sig Dispense Refill   albuterol  (PROVENTIL ) (2.5 MG/3ML) 0.083% nebulizer solution Take 3 mLs (2.5 mg total) by nebulization every 4 (four) hours as needed for wheezing or shortness of breath. 540 mL 5   albuterol  (VENTOLIN  HFA) 108 (90 Base) MCG/ACT inhaler Inhale 2 puffs into the lungs every 6 (six) hours as needed for wheezing or shortness  of breath. 18 g 5   amoxicillin  (AMOXIL ) 500 MG capsule TAKE 4 TABLETS BY MOUTH 30-60 MINUTES PRIOR TO DENTAL PROCEDURES (Patient not taking: Reported on 06/01/2023) 4 capsule 3   Ascorbic Acid (VITAMIN C ER PO) Take 2,000 mg by mouth daily.     aspirin  EC 81 MG tablet Take 1 tablet (81 mg total) by mouth daily.     azithromycin  (ZITHROMAX ) 250 MG tablet Take first 2 tablets together, then 1 every day until finished. (Patient not taking: Reported on 06/01/2023) 6 tablet 0   azithromycin  (ZITHROMAX ) 250 MG tablet Take first 2 tablets together, then 1 every day until finished. 6 tablet 0   budeson-glycopyrrolate -formoterol  (BREZTRI  AEROSPHERE) 160-9-4.8 MCG/ACT  AERO Inhale 2 puffs into the lungs in the morning and at bedtime. 10.7 g 5   chlorpheniramine (CHLOR-TRIMETON) 4 MG tablet Take 4 mg by mouth See admin instructions. Take 4 mg in the morning and 8 mg at bedtime     Cholecalciferol (VITAMIN D) 125 MCG (5000 UT) CAPS Take 5,000 Units by mouth daily.     EPINEPHrine  (EPIPEN ) 0.3 mg/0.3 mL IJ SOAJ injection Inject 0.3 mg into the muscle as needed for anaphylaxis. For allergic reaction     furosemide  (LASIX ) 40 MG tablet Take 1 tablet (40 mg total) by mouth 2 (two) times daily. 180 tablet 3   gabapentin  (NEURONTIN ) 600 MG tablet Take 600 mg by mouth 2 (two) times daily.     linaclotide  (LINZESS ) 145 MCG CAPS capsule Take 145 mcg by mouth every morning.     losartan  (COZAAR ) 25 MG tablet take 1 tablet once daily. 90 tablet 3   magnesium  oxide (MAG-OX) 400 MG tablet Take 1 tablet (400 mg total) by mouth daily. 90 tablet 3   metolazone  (ZAROXOLYN ) 2.5 MG tablet take 1 tablet by mouth on monday, wednesday and friday. 36 tablet 3   montelukast  (SINGULAIR ) 10 MG tablet Take 1 tablet (10 mg total) by mouth at bedtime. 30 tablet 5   naloxone  (NARCAN ) nasal spray 4 mg/0.1 mL Place 1 spray into the nose once.     omeprazole  (PRILOSEC) 40 MG capsule Take 40 mg by mouth daily.     Oxycodone  HCl 10 MG TABS      potassium chloride  (KLOR-CON  M) 10 MEQ tablet Take 4 tablets (40 mEq total) by mouth 2 (two) times daily. 620 tablet 3   predniSONE  (DELTASONE ) 10 MG tablet 4 tabs daily with food x 4 days, 3 tabs daily with food x 4 days, 2 tabs daily with food x 4 days, 1 tab daily with food x 4 days 40 tablet 0   promethazine  (PHENERGAN ) 25 MG tablet Take 1 tablet (25 mg total) by mouth every 8 (eight) hours as needed for nausea or vomiting. 10 tablet 0   rosuvastatin  (CRESTOR ) 20 MG tablet Take 1 tablet (20 mg total) by mouth daily. 90 tablet 3   traZODone  (DESYREL ) 150 MG tablet Take 150 mg by mouth at bedtime.     warfarin (COUMADIN ) 5 MG tablet take one-half (1/2)  tablet to 1 tablet by mouth daily or as directed by the anticoagulation clinic. (Patient taking differently: Take ONE-HALF (1/2) tablet M,W,F to 1 tablet the rest of the week by mouth daily or as directed by the Anticoagulation Clinic.) 75 tablet 0     Assessment: 63 y.o. female admitted with SOB, h/o Afib, to continue Coumadin .  Last dose taken 8/8  Goal of Therapy:  INR 2-3 Monitor platelets by anticoagulation protocol:  Yes   Plan:  Coumadin  7.5 mg tonight Daily INR  Dail Cordella Misty 10/07/2023,11:22 PM

## 2023-10-07 NOTE — ED Provider Notes (Signed)
  EMERGENCY DEPARTMENT AT [facility] Provider Note   CSN: [id number] Arrival date & time: 10/07/23  2044     Patient presents with: Shortness of Breath   Lindsay Bonilla is a 63 y.o. female.  He has a history of COPD with chronic hypoxic respiratory failure on oxygen  usually in the evenings.  She has having worsening of her baseline shortness of breath over the last week more severe over the last few days.  She recently finished a course of steroids and antibiotics.  She has been using her nebulizer and inhaler without improvement.  She said she cannot walk across the room without becoming more short of breath.  She has had a cough productive of some yellow and white sputum.  No fever no chest pain.  Pulmonologist is Dr. Jude.  {Add pertinent medical, surgical, social history, OB history to YEP:67052} The history is provided by the patient.  Shortness of Breath Severity:  Severe Onset quality:  Gradual Duration:  1 week Timing:  Constant Progression:  Worsening Chronicity:  Recurrent Relieved by:  Nothing Worsened by:  Activity and coughing Ineffective treatments:  Inhaler, oxygen  and rest Associated symptoms: cough, sputum production and wheezing   Associated symptoms: no abdominal pain, no chest pain, no fever and no hemoptysis        Prior to Admission medications   Medication Sig Start Date End Date Taking? Authorizing Provider  albuterol  (PROVENTIL ) (2.5 MG/3ML) 0.083% nebulizer solution Take 3 mLs (2.5 mg total) by nebulization every 4 (four) hours as needed for wheezing or shortness of breath. 06/01/23   Jude Harden GAILS, MD  albuterol  (VENTOLIN  HFA) 108 [phone] Base) MCG/ACT inhaler Inhale 2 puffs into the lungs every 6 (six) hours as needed for wheezing or shortness of breath. 06/01/23   Jude Harden GAILS, MD  amoxicillin  (AMOXIL ) 500 MG capsule TAKE 4 TABLETS BY MOUTH 30-60 MINUTES PRIOR TO DENTAL PROCEDURES Patient not taking: Reported on 06/01/2023 02/11/22    Verlin Lonni JONETTA, MD  Ascorbic Acid (VITAMIN C ER PO) Take 2,000 mg by mouth daily.    [provider]  aspirin  EC 81 MG tablet Take 1 tablet (81 mg total) by mouth daily. 07/23/15   Lelon Hamilton T, PA-C  azithromycin  (ZITHROMAX ) 250 MG tablet Take first 2 tablets together, then 1 every day until finished. Patient not taking: Reported on 06/01/2023 04/07/23   Stuart Vernell Norris, PA-C  azithromycin  (ZITHROMAX ) 250 MG tablet Take first 2 tablets together, then 1 every day until finished. 09/11/23   Jude Harden GAILS, MD  budeson-glycopyrrolate -formoterol  (BREZTRI  AEROSPHERE) 160-9-4.8 MCG/ACT AERO Inhale 2 puffs into the lungs in the morning and at bedtime. 06/01/23   Jude Harden GAILS, MD  chlorpheniramine (CHLOR-TRIMETON) 4 MG tablet Take 4 mg by mouth See admin instructions. Take 4 mg in the morning and 8 mg at bedtime    [provider]  Cholecalciferol (VITAMIN D) 125 MCG (5000 UT) CAPS Take 5,000 Units by mouth daily.    [provider]  EPINEPHrine  (EPIPEN ) 0.3 mg/0.3 mL IJ SOAJ injection Inject 0.3 mg into the muscle as needed for anaphylaxis. For allergic reaction    [provider]  furosemide  (LASIX ) 40 MG tablet Take 1 tablet (40 mg total) by mouth 2 (two) times daily. 11/02/22   Verlin Lonni JONETTA, MD  gabapentin  (NEURONTIN ) 600 MG tablet Take 600 mg by mouth 2 (two) times daily. 07/01/21   [provider]  linaclotide  (LINZESS ) 145 MCG CAPS capsule Take 145  mcg by mouth every morning. 09/08/20   [provider]  losartan  (COZAAR ) 25 MG tablet take 1 tablet once daily. 12/01/22   Verlin Lonni BIRCH, MD  magnesium  oxide (MAG-OX) 400 MG tablet Take 1 tablet (400 mg total) by mouth daily. 12/31/18   Dunn, Dayna N, PA-C  metolazone  (ZAROXOLYN ) 2.5 MG tablet take 1 tablet by mouth on monday, wednesday and friday. 12/01/22   Verlin Lonni BIRCH, MD  montelukast  (SINGULAIR ) 10 MG tablet Take 1 tablet (10 mg total) by mouth at bedtime.  06/01/23   Jude Harden GAILS, MD  naloxone  (NARCAN ) nasal spray 4 mg/0.1 mL Place 1 spray into the nose once.    [provider]  omeprazole  (PRILOSEC) 40 MG capsule Take 40 mg by mouth daily.    [provider]  Oxycodone  HCl 10 MG TABS     [provider]  potassium chloride  (KLOR-CON  M) 10 MEQ tablet Take 4 tablets (40 mEq total) by mouth 2 (two) times daily. 11/02/22   Verlin Lonni BIRCH, MD  predniSONE  (DELTASONE ) 10 MG tablet 4 tabs daily with food x 4 days, 3 tabs daily with food x 4 days, 2 tabs daily with food x 4 days, 1 tab daily with food x 4 days 09/11/23   Jude Harden GAILS, MD  promethazine  (PHENERGAN ) 25 MG tablet Take 1 tablet (25 mg total) by mouth every 8 (eight) hours as needed for nausea or vomiting. 01/04/23   Stuart Vernell Norris, PA-C  rosuvastatin  (CRESTOR ) 20 MG tablet Take 1 tablet (20 mg total) by mouth daily. 11/14/22   Verlin Lonni BIRCH, MD  traZODone  (DESYREL ) 150 MG tablet Take 150 mg by mouth at bedtime.    [provider]  warfarin (COUMADIN ) 5 MG tablet take one-half (1/2) tablet to 1 tablet by mouth daily or as directed by the anticoagulation clinic. Patient taking differently: Take ONE-HALF (1/2) tablet M,W,F to 1 tablet the rest of the week by mouth daily or as directed by the Anticoagulation Clinic. 06/01/23   Verlin Lonni BIRCH, MD    Allergies: Alpha-gal, Beta adrenergic blockers, Doxycycline , Vancomycin , Avelox [moxifloxacin hcl in nacl], Ciprofloxacin, Cleocin [clindamycin hcl], Moxifloxacin, Sulfamethoxazole-trimethoprim, and Tape    Review of Systems  Constitutional:  Negative for fever.  Respiratory:  Positive for cough, sputum production, shortness of breath and wheezing. Negative for hemoptysis.   Cardiovascular:  Negative for chest pain.  Gastrointestinal:  Negative for abdominal pain.    Updated Vital Signs BP 123/83 (BP Location: Right Arm)   Pulse 83   Temp 98.3 F (36.8 C) (Oral)   Resp 18   Ht 5'  2 (1.575 m)   Wt 83.9 kg   SpO2 100%   BMI 33.84 kg/m   Physical Exam Vitals and nursing note reviewed.  Constitutional:      General: She is not in acute distress.    Appearance: Normal appearance. She is well-developed.  HENT:     Head: Normocephalic and atraumatic.  Eyes:     Conjunctiva/sclera: Conjunctivae normal.  Cardiovascular:     Rate and Rhythm: Normal rate and regular rhythm.     Heart sounds: Murmur heard.     Comments: Loud mechanical click Pulmonary:     Effort: Tachypnea and accessory muscle usage present. No respiratory distress.     Breath sounds: No stridor. Wheezing present.  Abdominal:     Palpations: Abdomen is soft.     Tenderness: There is no abdominal tenderness. There is no guarding or rebound.  Musculoskeletal:        General: No tenderness or deformity. Normal range of motion.     Cervical back: Neck supple.     Right lower leg: No edema.     Left lower leg: No edema.  Skin:    General: Skin is warm and dry.  Neurological:     General: No focal deficit present.     Mental Status: She is alert.     GCS: GCS eye subscore is 4. GCS verbal subscore is 5. GCS motor subscore is 6.     (all labs ordered are listed, but only abnormal results are displayed) Labs Reviewed  SARS CORONAVIRUS 2 BY RT PCR  CBC WITH DIFFERENTIAL/PLATELET  COMPREHENSIVE METABOLIC PANEL WITH GFR  MAGNESIUM   BRAIN NATRIURETIC PEPTIDE  PROTIME-INR    EKG: None  Radiology: No results found.  {Document cardiac monitor, telemetry assessment procedure when appropriate:32947} Procedures   Medications Ordered in the ED  methylPREDNISolone  sodium succinate (SOLU-MEDROL ) 125 mg/2 mL injection 125 mg (has no administration in time range)  magnesium  sulfate IVPB 2 g 50 mL (has no administration in time range)  albuterol  (PROVENTIL ,VENTOLIN ) solution continuous neb (has no administration in time range)      {Click here for ABCD2, HEART and other calculators REFRESH Note  before signing:1}                              Medical Decision Making Amount and/or Complexity of Data Reviewed Labs: ordered. Radiology: ordered.  Risk Prescription drug management.   This patient complains of ***; this involves an extensive number of treatment Options and is a complaint that carries with it a high risk of complications and morbidity. The differential includes ***  I ordered, reviewed and interpreted labs, which included *** I ordered medication *** and reviewed PMP when indicated. I ordered imaging studies which included *** and I independently    visualized and interpreted imaging which showed *** Additional history obtained from *** Previous records obtained and reviewed *** I consulted *** and discussed lab and imaging findings and discussed disposition.  Cardiac monitoring reviewed, *** Social determinants considered, *** Critical Interventions: ***  After the interventions stated above, I reevaluated the patient and found *** Admission and further testing considered, ***   {Document critical care time when appropriate  Document review of labs and clinical decision tools ie CHADS2VASC2, etc  Document your independent review of radiology images and any outside records  Document your discussion with family members, caretakers and with consultants  Document social determinants of health affecting pt's care  Document your decision making why or why not admission, treatments were needed:32947:::1}   Final diagnoses:  None    ED Discharge Orders     None

## 2023-10-07 NOTE — ED Triage Notes (Signed)
 Pt comes in for SOB. Pt has hx of COPD and HF. Pt is on 2 L [place] at home (baseline). Pt has been sob for the past 2-3 days. Pt has tried multiple breathing medications and they have not been helping. Pt is in a tripod position but unlabored breathing. Pt is A&Ox4.

## 2023-10-08 DIAGNOSIS — J441 Chronic obstructive pulmonary disease with (acute) exacerbation: Secondary | ICD-10-CM

## 2023-10-08 LAB — BASIC METABOLIC PANEL WITH GFR
Anion gap: 14 (ref 5–15)
BUN: 15 mg/dL (ref 8–23)
CO2: 27 mmol/L (ref 22–32)
Calcium: 8.6 mg/dL — ABNORMAL LOW (ref 8.9–10.3)
Chloride: 92 mmol/L — ABNORMAL LOW (ref 98–111)
Creatinine, Ser: 0.7 mg/dL (ref 0.44–1.00)
GFR, Estimated: >60 mL/min (ref >60)
Glucose, Bld: 203 mg/dL — ABNORMAL HIGH (ref 70–99)
Potassium: 2.6 mmol/L — CL (ref 3.5–5.1)
Sodium: 133 mmol/L — ABNORMAL LOW (ref 135–145)

## 2023-10-08 LAB — CBC
HCT: 40.5 % (ref 36.0–46.0)
Hemoglobin: 13.4 g/dL (ref 12.0–15.0)
MCH: 29.3 pg (ref 26.0–34.0)
MCHC: 33.1 g/dL (ref 30.0–36.0)
MCV: 88.4 fL (ref 80.0–100.0)
Platelets: 249 K/uL (ref 150–400)
RBC: 4.58 MIL/uL (ref 3.87–5.11)
RDW: 13.3 % (ref 11.5–15.5)
WBC: 7.8 K/uL (ref 4.0–10.5)
nRBC: 0 % (ref 0.0–0.2)

## 2023-10-08 LAB — HIV ANTIBODY (ROUTINE TESTING W REFLEX): HIV Screen 4th Generation wRfx: NONREACTIVE

## 2023-10-08 LAB — PROTIME-INR
INR: 1.7 — ABNORMAL HIGH (ref 0.8–1.2)
Prothrombin Time: 21.2 s — ABNORMAL HIGH (ref 11.4–15.2)

## 2023-10-08 MED ORDER — POTASSIUM CHLORIDE 20 MEQ PO PACK
40.0000 meq | PACK | ORAL | Status: AC
  Administered 2023-10-08 (×3): 40 meq via ORAL
  Filled 2023-10-08 (×3): qty 2

## 2023-10-08 MED ORDER — ROSUVASTATIN CALCIUM 20 MG PO TABS: 20.0000 mg | ORAL_TABLET | Freq: Every day | ORAL | Status: DC

## 2023-10-08 MED ORDER — DM-GUAIFENESIN ER 30-600 MG PO TB12
1.0000 | ORAL_TABLET | Freq: Two times a day (BID) | ORAL | Status: DC
  Administered 2023-10-08 – 2023-10-10 (×8): 1 via ORAL
  Filled 2023-10-08 (×5): qty 1

## 2023-10-08 MED ORDER — FUROSEMIDE 40 MG PO TABS
40.0000 mg | ORAL_TABLET | Freq: Every day | ORAL | Status: DC
  Administered 2023-10-08 – 2023-10-10 (×5): 40 mg via ORAL
  Filled 2023-10-08 (×4): qty 1

## 2023-10-08 MED ORDER — METHYLPREDNISOLONE SODIUM SUCC 40 MG IJ SOLR
40.0000 mg | Freq: Two times a day (BID) | INTRAMUSCULAR | Status: DC
  Administered 2023-10-08 – 2023-10-10 (×8): 40 mg via INTRAVENOUS
  Filled 2023-10-08 (×5): qty 1

## 2023-10-08 MED ORDER — LINACLOTIDE 145 MCG PO CAPS
145.0000 ug | ORAL_CAPSULE | ORAL | Status: DC
  Administered 2023-10-08: 145 ug via ORAL
  Filled 2023-10-08: qty 1

## 2023-10-08 MED ORDER — WARFARIN SODIUM 7.5 MG PO TABS
7.5000 mg | ORAL_TABLET | Freq: Once | ORAL | Status: AC
  Administered 2023-10-08: 7.5 mg via ORAL
  Filled 2023-10-08: qty 1

## 2023-10-08 MED ORDER — POTASSIUM CHLORIDE 10 MEQ/100ML IV SOLN
10.0000 meq | INTRAVENOUS | Status: AC
  Administered 2023-10-08 (×4): 10 meq via INTRAVENOUS
  Filled 2023-10-08 (×4): qty 100

## 2023-10-08 MED ORDER — SODIUM CHLORIDE 0.9 % IV SOLN
2.0000 g | INTRAVENOUS | Status: DC
  Administered 2023-10-08 – 2023-10-10 (×5): 2 g via INTRAVENOUS
  Filled 2023-10-08 (×3): qty 20

## 2023-10-08 MED ORDER — IPRATROPIUM-ALBUTEROL 0.5-2.5 (3) MG/3ML IN SOLN
3.0000 mL | Freq: Three times a day (TID) | RESPIRATORY_TRACT | Status: DC
  Administered 2023-10-08 – 2023-10-10 (×10): 3 mL via RESPIRATORY_TRACT
  Filled 2023-10-08 (×5): qty 3

## 2023-10-08 MED ORDER — ORAL CARE MOUTH RINSE: 15.0000 mL | OROMUCOSAL | Status: DC | PRN

## 2023-10-08 NOTE — Progress Notes (Signed)
   10/08/23 1617  TOC Brief Assessment  Insurance and Status Reviewed  Patient has primary care physician Yes  Home environment has been reviewed From home  Prior level of function: Independent  Prior/Current Home Services No current home services  Social Drivers of Health Review SDOH reviewed no interventions necessary  Readmission risk has been reviewed Yes  Transition of care needs no transition of care needs at this time   Pt has home oxygen  2 lpm at baseline.  Transition of Care Department [facility]) has reviewed patient and no TOC needs have been identified at this time. We will continue to monitor patient advancement through interdisciplinary progression rounds. If new patient transition needs arise, please place a TOC consult.

## 2023-10-08 NOTE — Progress Notes (Signed)
 PHARMACY - ANTICOAGULATION CONSULT NOTE  Pharmacy Consult for Coumadin  Indication: atrial fibrillation  Allergies  Allergen Reactions   Alpha-Gal     Red meat   Beta Adrenergic Blockers Anaphylaxis   Doxycycline  Other (See Comments)    Swelling, dizziness, sleepy, talking out of my head, almost caused liver failure   Vancomycin  Swelling    Rash and severe swelling   Avelox [Moxifloxacin Hcl In Nacl] Rash   Ciprofloxacin Nausea And Vomiting   Cleocin [Clindamycin Hcl] Rash   Moxifloxacin Rash   Sulfamethoxazole-Trimethoprim Rash   Tape Other (See Comments)    White clear itches, rash ONLY use paper tape    Patient Measurements: Height: 5' 2 (157.5 cm) Weight: 88.5 kg (195 lb 1.7 oz) IBW/kg (Calculated) : 50.1 HEPARIN  DW (KG): 69  Vital Signs: Temp: 98.3 F (36.8 C) (08/10 0809) Temp Source: Oral (08/10 0809) BP: 127/85 (08/10 0809) Pulse Rate: 83 (08/10 0809)  Labs: Recent Labs    10/07/23 2127 10/08/23 0503  HGB 13.5 13.4  HCT 40.2 40.5  PLT 239 249  LABPROT 22.2* 21.2*  INR 1.8* 1.7*  CREATININE 0.68 0.70    Estimated Creatinine Clearance: 75.4 mL/min (by C-G formula based on SCr of 0.7 mg/dL).   Medical History: Past Medical History:  Diagnosis Date   Anxiety    Aortic aneurysm (HCC)    Aortic insufficiency    a. s/p Bentall with mechanical AVR 4/17 >> FU Echo 6/17: Mild LVH, EF 60-65%, normal wall motion, ventricular septum with paradoxical septal motion, St. Jude mechanical AVR functioning  normally with no regurgitation (mean 6 mmHg), MAC, trivial MR, mild RVE, trivial TR   Aortic stenosis, severe    Bicuspid valve   Arthritis    Asthma    Atrial flutter (HCC)    Bitten or stung by nonvenomous insect and other nonvenomous arthropods, initial encounter    Bronchitis    Chronic diastolic CHF (congestive heart failure) (HCC)    Chronic respiratory failure with hypoxia (HCC)    COPD (chronic obstructive pulmonary disease) (HCC)    DDD  (degenerative disc disease), cervical    Depression    Endocarditis, valve unspecified    GERD (gastroesophageal reflux disease)    Hard of hearing    Heart failure (HCC)    Heart failure, unspecified (HCC)    Hemorrhage, not elsewhere classified    History of pneumonia    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   Current Outpatient Medications on File Prior to Encounter  Medication Sig Dispense Refill   albuterol  (PROVENTIL ) (2.5 MG/3ML) 0.083% nebulizer solution Take 3 mLs (2.5 mg total) by nebulization every 4 (four) hours as needed for wheezing or shortness of breath. 540 mL 5   albuterol  (VENTOLIN  HFA) 108 (90 Base) MCG/ACT inhaler Inhale 2 puffs into the lungs  every 6 (six) hours as needed for wheezing or shortness of breath. 18 g 5   amoxicillin  (AMOXIL ) 500 MG capsule TAKE 4 TABLETS BY MOUTH 30-60 MINUTES PRIOR TO DENTAL PROCEDURES (Patient not taking: Reported on 06/01/2023) 4 capsule 3   Ascorbic Acid (VITAMIN C ER PO) Take 2,000 mg by mouth daily.     aspirin  EC 81 MG tablet Take 1 tablet (81 mg total) by mouth daily.     azithromycin  (ZITHROMAX ) 250 MG tablet Take first 2 tablets together, then 1 every day until finished. (Patient not taking: Reported on 06/01/2023) 6 tablet 0   azithromycin  (ZITHROMAX ) 250 MG tablet Take first 2 tablets together, then 1 every day until finished. 6 tablet 0    budeson-glycopyrrolate -formoterol  (BREZTRI  AEROSPHERE) 160-9-4.8 MCG/ACT AERO Inhale 2 puffs into the lungs in the morning and at bedtime. 10.7 g 5   chlorpheniramine (CHLOR-TRIMETON) 4 MG tablet Take 4 mg by mouth See admin instructions. Take 4 mg in the morning and 8 mg at bedtime     Cholecalciferol (VITAMIN D) 125 MCG (5000 UT) CAPS Take 5,000 Units by mouth daily.     EPINEPHrine  (EPIPEN ) 0.3 mg/0.3 mL IJ SOAJ injection Inject 0.3 mg into the muscle as needed for anaphylaxis. For allergic reaction     furosemide  (LASIX ) 40 MG tablet Take 1 tablet (40 mg total) by mouth 2 (two) times daily. 180 tablet 3   gabapentin  (NEURONTIN ) 600 MG tablet Take 600 mg by mouth 2 (two) times daily.     linaclotide  (LINZESS ) 145 MCG CAPS capsule Take 145 mcg by mouth every morning.     losartan  (COZAAR ) 25 MG tablet take 1 tablet once daily. 90 tablet 3   magnesium  oxide (MAG-OX) 400 MG tablet Take 1 tablet (400 mg total) by mouth daily. 90 tablet 3   metolazone  (ZAROXOLYN ) 2.5 MG tablet take 1 tablet by mouth on monday, wednesday and friday. 36 tablet 3   montelukast  (SINGULAIR ) 10 MG tablet Take 1 tablet (10 mg total) by mouth at bedtime. 30 tablet 5   naloxone  (NARCAN ) nasal spray 4 mg/0.1 mL Place 1 spray into the nose once.     omeprazole  (PRILOSEC) 40 MG capsule Take 40 mg by mouth daily.     Oxycodone  HCl 10 MG TABS      potassium chloride  (KLOR-CON  M) 10 MEQ tablet Take 4 tablets (40 mEq total) by mouth 2 (two) times daily. 620 tablet 3   predniSONE  (DELTASONE ) 10 MG tablet 4 tabs daily with food x 4 days, 3 tabs daily with food x 4 days, 2 tabs daily with food x 4 days, 1 tab daily with food x 4 days 40 tablet 0   promethazine  (PHENERGAN ) 25 MG tablet Take 1 tablet (25 mg total) by mouth every 8 (eight) hours as needed for nausea or vomiting. 10 tablet 0   rosuvastatin  (CRESTOR ) 20 MG tablet Take 1 tablet (20 mg total) by mouth daily. 90 tablet 3   traZODone  (DESYREL ) 150 MG tablet Take 150 mg by mouth  at bedtime.     warfarin (COUMADIN ) 5 MG tablet take one-half (1/2) tablet to 1 tablet by mouth daily or as directed by the anticoagulation clinic. (Patient taking differently: Take ONE-HALF (1/2) tablet M,W,F to 1 tablet the rest of the week by mouth daily or as directed by the Anticoagulation Clinic.) 75 tablet 0     Assessment: 63 y.o. female admitted with SOB, h/o Afib and aortic valve replacement to continue Coumadin .   INR  1.7, subtherapeutic, will give another booster dose today  PTA dose 2.5mg  MWF, and 5mg  T, TH, Sat , Sun  Goal of Therapy:  INR 2-3 Monitor platelets by anticoagulation protocol: Yes   Plan:  Coumadin  7.5 mg tonight Daily INR Monitor for S/S of bleeding  Cherlyn Boers, BS Pharm D, BCPS Clinical Pharmacist 10/08/2023,8:42 AM

## 2023-10-08 NOTE — Progress Notes (Signed)
 Patient seen and evaluated, chart reviewed, please see EMR for updated orders. Please see full H&P dictated by admitting physician Dr. Dena for same date of service.    Brief Summary:- 63 y.o. female with medical history significant of aortic aneurysm, atrial flutter, advanced COPD, presence of mechanical aortic valve on chronic anticoagulation with Coumadin  and chronic hypoxic respiratory failure due to underlying COPD admitted on 10/08/2023 with worsening respiratory symptoms due to COPD flareup in setting of community-acquired pneumonia   A/p 1)CAP--no fevers, no leukocytosis, does not meet sepsis criteria - Cough, dyspnea and respiratory symptoms persist - Treat with Rocephin /azithromycin  (allergic to doxycycline ) - Mucolytics, bronchodilators and IV Solu-Medrol  as ordered  2)Acute on chronic respiratory failure most likely secondary to pneumonia and COPD exacerbation -At baseline requires 2 L of oxygen  -Try and wean back to baseline oxygen  requirement  3) acute COPD exacerbation--due to 1 above -Manage as above -  4) mechanical aortic valve--on Coumadin  -Risk of cytochrome P4 50 interaction between Coumadin  and azithromycin --- however patient is allergic to doxycycline  -Monitor INR closely  # Hypertension-continue losartan    # CAD-continue aspirin  and Crestor  for secondary prevention   # Heart failure preserved ejection fraction-continue home Lasix    # GERD-continue Protonix    # Insomnia-continue trazodone    # Hypokalemia-replete potassium  Plan of care discussed with patient and daughter at bedside, questions answered  Patient seen and evaluated, chart reviewed, please see EMR for updated orders. Please see full H&P dictated by admitting physician Dr. Dena for same date of service.   - Total care time 43 minutes  Rendall Carwin, MD

## 2023-10-08 NOTE — Plan of Care (Signed)

## 2023-10-08 NOTE — Progress Notes (Signed)
 Critical value received from lab this am of potassium of 2.6.  Dr Steffani page twice, and secure chat messages sent, no response.  Will make day RN aware to follow up for treatment.

## 2023-10-08 NOTE — H&P (Signed)
 History and Physical    Lindsay Bonilla FMW:[id number] DOB: [birth year] DOA: 10/07/2023  PCP: [facility]   Chief Complaint:  sob  HPI: Lindsay Bonilla is a 63 y.o. female with medical history significant of aortic aneurysm, atrial flutter, COPD, heart failure who presented to the emergency department due to shortness of breath.  Patient has a history of COPD and intermittently requires oxygen  at night.  She is having worsening wheezing and shortness of breath over the last week.  She was given outpatient course of antibiotics and steroids that improvement.  She was unable to walk across the room so she presented to the ER for further assessment.  On arrival she was endorsing a productive cough.  She was afebrile hemodynamically stable.  Labs were obtained on presentation which showed WBC 7.3, hemoglobin 13.5, platelets 239, potassium 3.1, magnesium  1.9, BNP 70, INR 1.8, COVID flu negative.  Patient underwent chest x-ray which showed patchy opacities in both lungs concerning for atelectasis versus infection.  Due to oxygen  requirement and concern for worsening COPD patient was admitted for further workup.  On admission she had mild wheezing but it responded to nebulizers.   Review of Systems: Review of Systems  Constitutional: Negative.  Negative for chills and fever.  HENT: Negative.    Eyes: Negative.   Respiratory:  Positive for shortness of breath.   Cardiovascular: Negative.   Gastrointestinal: Negative.   Genitourinary: Negative.   Musculoskeletal: Negative.   Skin: Negative.   Neurological: Negative.   Endo/Heme/Allergies: Negative.   Psychiatric/Behavioral: Negative.       As per HPI otherwise 10 point review of systems negative.   Allergies  Allergen Reactions   Alpha-Gal     Red meat   Beta Adrenergic Blockers Anaphylaxis   Doxycycline  Other (See Comments)    Swelling, dizziness, sleepy, talking out of my head, almost caused liver failure   Vancomycin   Swelling    Rash and severe swelling   Avelox [Moxifloxacin Hcl In Nacl] Rash   Ciprofloxacin Nausea And Vomiting   Cleocin [Clindamycin Hcl] Rash   Moxifloxacin Rash   Sulfamethoxazole-Trimethoprim Rash   Tape Other (See Comments)    White clear itches, rash ONLY use paper tape    Past Medical History:  Diagnosis Date   Anxiety    Aortic aneurysm (HCC)    Aortic insufficiency    a. s/p Bentall with mechanical AVR 4/17 >> FU Echo 6/17: Mild LVH, EF 60-65%, normal wall motion, ventricular septum with paradoxical septal motion, St. Jude mechanical AVR functioning  normally with no regurgitation (mean 6 mmHg), MAC, trivial MR, mild RVE, trivial TR   Aortic stenosis, severe    Bicuspid valve   Arthritis    Asthma    Atrial flutter (HCC)    Bitten or stung by nonvenomous insect and other nonvenomous arthropods, initial encounter    Bronchitis    Chronic diastolic CHF (congestive heart failure) (HCC)    Chronic respiratory failure with hypoxia (HCC)    COPD (chronic obstructive pulmonary disease) (HCC)    DDD (degenerative disc disease), cervical    Depression    Endocarditis, valve unspecified    GERD (gastroesophageal reflux disease)    Hard of hearing    Heart failure (HCC)    Heart failure, unspecified (HCC)    Hemorrhage, not elsewhere classified    History of pneumonia    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   Hyperkalemia    Low back pain  Migraine headache    Mild CAD    Other chronic pain    Palpitations    PAT (paroxysmal atrial tachycardia) (HCC)    PONV (postoperative nausea and vomiting)    only once   Premature atrial contractions    PVC's (premature ventricular contractions)    Shortness of breath    Nodule left lung CT done 8/6   Sinus bradycardia    Stress incontinence    Unspecified osteoarthritis, unspecified site    Valvular heart disease     Past Surgical History:  Procedure Laterality Date   ABDOMINAL  HYSTERECTOMY     ANTERIOR CERVICAL DECOMP/DISCECTOMY FUSION  10/05/2011   Procedure: ANTERIOR CERVICAL DECOMPRESSION/DISCECTOMY FUSION 2 LEVELS;  Surgeon: Catalina CHRISTELLA Stains, MD;  Location: [facility];  Service: Neurosurgery;  Laterality: N/A;  Cervical five - six , six - seven Anterior cervical decompression/diskectomy/fusion/plate   ANTERIOR CRUCIATE LIGAMENT REPAIR Right    AORTIC VALVE REPLACEMENT     ARTHROSCOPIC REPAIR ACL  rt   BENTALL PROCEDURE N/A 06/15/2015   Procedure: BENTALL PROCEDURE; HEMI-ARCH REPAIR WITH #23 ST JUDE MECHANICAL AVR CONDUIT AND #28 HEMASHIELD PLATINUM GRAFT;  Surgeon: Dorise MARLA Fellers, MD;  Location: [facility] OR;  Service: Open Heart Surgery;  Laterality: N/A;   CARDIAC CATHETERIZATION N/A 05/21/2015   Procedure: Right/Left Heart Cath and Coronary Angiography;  Surgeon: Lonni JONETTA Cash, MD;  Location: [facility] INVASIVE CV LAB;  Service: Cardiovascular;  Laterality: N/A;   CHOLECYSTECTOMY     COLONOSCOPY  10/13/05   COLONOSCOPY WITH PROPOFOL  N/A 07/22/2021   Procedure: COLONOSCOPY WITH PROPOFOL ;  Surgeon: Federico Rosario BROCKS, MD;  Location: [facility];  Service: Gastroenterology;  Laterality: N/A;   DIAGNOSTIC LAPAROSCOPY     HEMOSTASIS CLIP PLACEMENT  07/22/2021   Procedure: HEMOSTASIS CLIP PLACEMENT;  Surgeon: Federico Rosario BROCKS, MD;  Location: [facility];  Service: Gastroenterology;;   JOINT REPLACEMENT Right 2016   KNEE ARTHROSCOPY     rt x4  x1 lft   MYRINGOPLASTY W/ FAT GRAFT     graft from behind ear   POLYPECTOMY  07/22/2021   Procedure: POLYPECTOMY;  Surgeon: Federico Rosario BROCKS, MD;  Location: [facility];  Service: Gastroenterology;;   [place]  10/13/05, 09/19/05   TEE WITHOUT CARDIOVERSION N/A 06/15/2015   Procedure: TRANSESOPHAGEAL ECHOCARDIOGRAM (TEE);  Surgeon: Dorise MARLA Fellers, MD;  Location: [facility] OR;  Service: Open Heart Surgery;  Laterality: N/A;   TONSILLECTOMY  2005   TUBAL LIGATION     TYMPANOSTOMY TUBE PLACEMENT       reports that she quit smoking about 8  years ago. Her smoking use included cigarettes. She started smoking about 38 years ago. She has a 3 pack-year smoking history. She has never used smokeless tobacco. She reports that she does not drink alcohol and does not use drugs.  Family History  Problem Relation Age of Onset   Cancer Mother        Type unknown-? lung   Heart attack Father    Asthma Brother    Colon cancer Brother        dx at age 25   Esophageal cancer Maternal Grandfather        smoker   Diabetes Other        family history   Hyperlipidemia Other        family history    Prior to Admission medications   Medication Sig Start Date End Date Taking? Authorizing Provider  albuterol  (PROVENTIL ) (2.5 MG/3ML) 0.083% nebulizer solution Take 3  mLs (2.5 mg total) by nebulization every 4 (four) hours as needed for wheezing or shortness of breath. 06/01/23   Jude Harden GAILS, MD  albuterol  (VENTOLIN  HFA) 108 [id number] Base) MCG/ACT inhaler Inhale 2 puffs into the lungs every 6 (six) hours as needed for wheezing or shortness of breath. 06/01/23   Jude Harden GAILS, MD  amoxicillin  (AMOXIL ) 500 MG capsule TAKE 4 TABLETS BY MOUTH 30-60 MINUTES PRIOR TO DENTAL PROCEDURES Patient not taking: Reported on 06/01/2023 02/11/22   Verlin Lonni BIRCH, MD  Ascorbic Acid (VITAMIN C ER PO) Take 2,000 mg by mouth daily.    [provider]  aspirin  EC 81 MG tablet Take 1 tablet (81 mg total) by mouth daily. 07/23/15   Lelon Hamilton T, PA-C  azithromycin  (ZITHROMAX ) 250 MG tablet Take first 2 tablets together, then 1 every day until finished. Patient not taking: Reported on 06/01/2023 04/07/23   Stuart Vernell Norris, PA-C  azithromycin  (ZITHROMAX ) 250 MG tablet Take first 2 tablets together, then 1 every day until finished. 09/11/23   Jude Harden GAILS, MD  budeson-glycopyrrolate -formoterol  (BREZTRI  AEROSPHERE) 160-9-4.8 MCG/ACT AERO Inhale 2 puffs into the lungs in the morning and at bedtime. 06/01/23   Jude Harden GAILS, MD  chlorpheniramine (CHLOR-TRIMETON)  4 MG tablet Take 4 mg by mouth See admin instructions. Take 4 mg in the morning and 8 mg at bedtime    [provider]  Cholecalciferol (VITAMIN D) 125 MCG (5000 UT) CAPS Take 5,000 Units by mouth daily.    [provider]  EPINEPHrine  (EPIPEN ) 0.3 mg/0.3 mL IJ SOAJ injection Inject 0.3 mg into the muscle as needed for anaphylaxis. For allergic reaction    [provider]  furosemide  (LASIX ) 40 MG tablet Take 1 tablet (40 mg total) by mouth 2 (two) times daily. 11/02/22   Verlin Lonni BIRCH, MD  gabapentin  (NEURONTIN ) 600 MG tablet Take 600 mg by mouth 2 (two) times daily. 07/01/21   [provider]  linaclotide  (LINZESS ) 145 MCG CAPS capsule Take 145 mcg by mouth every morning. 09/08/20   [provider]  losartan  (COZAAR ) 25 MG tablet take 1 tablet once daily. 12/01/22   Verlin Lonni BIRCH, MD  magnesium  oxide (MAG-OX) 400 MG tablet Take 1 tablet (400 mg total) by mouth daily. 12/31/18   Dunn, Dayna N, PA-C  metolazone  (ZAROXOLYN ) 2.5 MG tablet take 1 tablet by mouth on monday, wednesday and friday. 12/01/22   Verlin Lonni BIRCH, MD  montelukast  (SINGULAIR ) 10 MG tablet Take 1 tablet (10 mg total) by mouth at bedtime. 06/01/23   Jude Harden GAILS, MD  naloxone  (NARCAN ) nasal spray 4 mg/0.1 mL Place 1 spray into the nose once.    [provider]  omeprazole  (PRILOSEC) 40 MG capsule Take 40 mg by mouth daily.    [provider]  Oxycodone  HCl 10 MG TABS     [provider]  potassium chloride  (KLOR-CON  M) 10 MEQ tablet Take 4 tablets (40 mEq total) by mouth 2 (two) times daily. 11/02/22   Verlin Lonni BIRCH, MD  predniSONE  (DELTASONE ) 10 MG tablet 4 tabs daily with food x 4 days, 3 tabs daily with food x 4 days, 2 tabs daily with food x 4 days, 1 tab daily with food x 4 days 09/11/23   Jude Harden GAILS, MD  promethazine  (PHENERGAN ) 25 MG tablet Take 1 tablet (25 mg total) by mouth every 8 (eight) hours as needed for nausea or  vomiting. 01/04/23   Stuart Vernell Norris, PA-C  rosuvastatin  (CRESTOR ) 20 MG tablet Take 1 tablet (20 mg total) by mouth daily. 11/14/22   Verlin Lonni BIRCH, MD  traZODone  (DESYREL ) 150 MG tablet Take 150 mg by mouth at bedtime.    [provider]  warfarin (COUMADIN ) 5 MG tablet take one-half (1/2) tablet to 1 tablet by mouth daily or as directed by the anticoagulation clinic. Patient taking differently: Take ONE-HALF (1/2) tablet M,W,F to 1 tablet the rest of the week by mouth daily or as directed by the Anticoagulation Clinic. 06/01/23   Verlin Lonni BIRCH, MD    Physical Exam: Vitals:   10/07/23 2327 10/07/23 2330 10/08/23 0000 10/08/23 0400  BP:  126/74 (!) 155/86 106/71  Pulse: 84 85 89 82  Resp: 19 17 19 18   Temp:  97.6 F (36.4 C) 98.3 F (36.8 C) 97.8 F (36.6 C)  TempSrc:  Axillary Oral Oral  SpO2: 94% 93% 98% 96%  Weight:   88.5 kg   Height:       Physical Exam Constitutional:      Appearance: She is normal weight.  HENT:     Head: Normocephalic.  Eyes:     Pupils: Pupils are equal, round, and reactive to light.  Cardiovascular:     Rate and Rhythm: Normal rate and regular rhythm.  Pulmonary:     Effort: Pulmonary effort is normal.     Breath sounds: Wheezing present.  Abdominal:     Palpations: Abdomen is soft.  Musculoskeletal:        General: Normal range of motion.     Cervical back: Normal range of motion.  Skin:    General: Skin is warm.     Capillary Refill: Capillary refill takes less than 2 seconds.  Neurological:     General: No focal deficit present.     Mental Status: She is alert.  Psychiatric:        Mood and Affect: Mood normal.        Labs on Admission: I have personally reviewed the patients's labs and imaging studies.  Assessment/Plan Principal Problem:   COPD with acute exacerbation (HCC)   # Acute on chronic respiratory failure most likely secondary to COPD exacerbation - Chest x-ray with possible opacity -  Wheezing - Productive cough - Using inhalers at home Plan: Scheduled DuoNebs Pulmicort  and Brovana  3 days of azithromycin  5 days of prednisone   # Hypertension-continue losartan   # Mechanical aortic valve-continue Coumadin   # CAD-continue aspirin   # Heart failure preserved ejection fraction-continue home Lasix   # GERD-continue Protonix   # Hyperlipidemia-continue Crestor   # Insomnia-continue trazodone   # Hypokalemia-replete potassium   Admission status: Inpatient Telemetry  Certification: The appropriate patient status for this patient is INPATIENT. Inpatient status is judged to be reasonable and necessary in order to provide the required intensity of service to ensure the patient's safety. The patient's presenting symptoms, physical exam findings, and initial radiographic and laboratory data in the context of their chronic comorbidities is felt to place them at high risk for further clinical deterioration. Furthermore, it is not anticipated that the patient will be medically stable for discharge from the hospital within 2 midnights of admission.   * I certify that at the point of admission it is my clinical judgment that the patient will require inpatient hospital care spanning beyond 2 midnights from the point of admission due to high intensity of service, high risk for further deterioration and high frequency of surveillance required.DEWAINE Lamar Dess MD [facility] If  7PM-7AM, please contact night-coverage [email or web address]  10/08/2023, 5:52 AM

## 2023-10-09 DIAGNOSIS — J441 Chronic obstructive pulmonary disease with (acute) exacerbation: Secondary | ICD-10-CM | POA: Diagnosis not present

## 2023-10-09 LAB — RENAL FUNCTION PANEL
Albumin: 3.5 g/dL (ref 3.5–5.0)
Anion gap: 8 (ref 5–15)
BUN: 20 mg/dL (ref 8–23)
CO2: 25 mmol/L (ref 22–32)
Calcium: 8.3 mg/dL — ABNORMAL LOW (ref 8.9–10.3)
Chloride: 102 mmol/L (ref 98–111)
Creatinine, Ser: 0.6 mg/dL (ref 0.44–1.00)
GFR, Estimated: >60 mL/min (ref >60)
Glucose, Bld: 137 mg/dL — ABNORMAL HIGH (ref 70–99)
Phosphorus: 3.1 mg/dL (ref 2.5–4.6)
Potassium: 4.8 mmol/L (ref 3.5–5.1)
Sodium: 135 mmol/L (ref 135–145)

## 2023-10-09 LAB — CBC
HCT: 39 % (ref 36.0–46.0)
Hemoglobin: 12.7 g/dL (ref 12.0–15.0)
MCH: 30 pg (ref 26.0–34.0)
MCHC: 32.6 g/dL (ref 30.0–36.0)
MCV: 92 fL (ref 80.0–100.0)
Platelets: 272 K/uL (ref 150–400)
RBC: 4.24 MIL/uL (ref 3.87–5.11)
RDW: 13.9 % (ref 11.5–15.5)
WBC: 14.7 K/uL — ABNORMAL HIGH (ref 4.0–10.5)
nRBC: 0 % (ref 0.0–0.2)

## 2023-10-09 LAB — PROTIME-INR
INR: 3.2 — ABNORMAL HIGH (ref 0.8–1.2)
Prothrombin Time: 34.4 s — ABNORMAL HIGH (ref 11.4–15.2)

## 2023-10-09 LAB — MAGNESIUM: Magnesium: 2.1 mg/dL (ref 1.7–2.4)

## 2023-10-09 NOTE — Progress Notes (Signed)
 Mobility Specialist Progress Note:    10/09/23 1709  Mobility  Activity Ambulated with assistance  Level of Assistance Standby assist, set-up cues, supervision of patient - no hands on  Assistive Device None  Distance Ambulated (ft) 120 ft  Range of Motion/Exercises Active;All extremities  Activity Response Tolerated well  Mobility Referral Yes  Mobility visit 1 Mobility  Mobility Specialist Start Time (ACUTE ONLY) 1650  Mobility Specialist Stop Time (ACUTE ONLY) 1709  Mobility Specialist Time Calculation (min) (ACUTE ONLY) 19 min   Pt received in bed, agreeable to mobility. Required SBA to stand and ambulate with no AD. Tolerated well, see O2 sats below. Audible SOB throughout session and after. Returned to room, left sitting EOB. All needs met.  SpO2 95% on RA at rest SpO2 88% on RA during ambulation SpO2 89-90% on 1L during ambulation SpO2 93% on 2L during ambulation SpO2 96% on 2L after ambulating and recovering   Sherrilee Ditty Mobility Specialist Please contact via SecureChat or  Rehab office at [phone]

## 2023-10-09 NOTE — Progress Notes (Signed)
 PHARMACY - ANTICOAGULATION CONSULT NOTE  Pharmacy Consult for Coumadin  Indication: atrial fibrillation  Allergies  Allergen Reactions   Alpha-Gal Other (See Comments)    Red meat   Beta Adrenergic Blockers Anaphylaxis   Doxycycline  Swelling and Other (See Comments)    Dizziness, sleepy, talking out of my head, almost caused liver failure   Vancomycin  Swelling and Rash    Severe swelling   Avelox [Moxifloxacin Hcl In Nacl] Rash   Ciprofloxacin Nausea And Vomiting   Cleocin [Clindamycin Hcl] Rash   Moxifloxacin Rash   Sulfamethoxazole-Trimethoprim Rash   Tape Other (See Comments)    White clear itches, rash ONLY use paper tape    Patient Measurements: Height: 5' 2 (157.5 cm) Weight: 88.5 kg (195 lb 1.7 oz) IBW/kg (Calculated) : 50.1 HEPARIN  DW (KG): 69  Vital Signs: Temp: 98.3 F (36.8 C) (08/11 0406) Temp Source: Oral (08/11 0406) BP: 135/78 (08/11 0406) Pulse Rate: 82 (08/11 0406)  Labs: Recent Labs    10/07/23 2127 10/08/23 0503 10/09/23 0444  HGB 13.5 13.4  --   HCT 40.2 40.5  --   PLT 239 249  --   LABPROT 22.2* 21.2* 34.4*  INR 1.8* 1.7* 3.2*  CREATININE 0.68 0.70 0.60    Estimated Creatinine Clearance: 75.4 mL/min (by C-G formula based on SCr of 0.6 mg/dL).   Medical History: Past Medical History:  Diagnosis Date   Anxiety    Aortic aneurysm (HCC)    Aortic insufficiency    a. s/p Bentall with mechanical AVR 4/17 >> FU Echo 6/17: Mild LVH, EF 60-65%, normal wall motion, ventricular septum with paradoxical septal motion, St. Jude mechanical AVR functioning  normally with no regurgitation (mean 6 mmHg), MAC, trivial MR, mild RVE, trivial TR   Aortic stenosis, severe    Bicuspid valve   Arthritis    Asthma    Atrial flutter (HCC)    Bitten or stung by nonvenomous insect and other nonvenomous arthropods, initial encounter    Bronchitis    Chronic diastolic CHF (congestive heart failure) (HCC)    Chronic respiratory failure with hypoxia (HCC)     COPD (chronic obstructive pulmonary disease) (HCC)    DDD (degenerative disc disease), cervical    Depression    Endocarditis, valve unspecified    GERD (gastroesophageal reflux disease)    Hard of hearing    Heart failure (HCC)    Heart failure, unspecified (HCC)    Hemorrhage, not elsewhere classified    History of pneumonia    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   Current Outpatient Medications on File Prior to Encounter  Medication Sig Dispense Refill   albuterol  (PROVENTIL ) (2.5 MG/3ML) 0.083% nebulizer solution Take 3 mLs (2.5 mg total) by nebulization every 4 (four) hours as needed for wheezing or shortness of breath. 540 mL  5   albuterol  (VENTOLIN  HFA) 108 (90 Base) MCG/ACT inhaler Inhale 2 puffs into the lungs every 6 (six) hours as needed for wheezing or shortness of breath. 18 g 5   Ascorbic Acid (VITAMIN C ER PO) Take 2,000 mg by mouth daily.     aspirin  EC 81 MG tablet Take 1 tablet (81 mg total) by mouth daily. (Patient taking differently: Take 81 mg by mouth at bedtime.)     budeson-glycopyrrolate -formoterol  (BREZTRI  AEROSPHERE) 160-9-4.8 MCG/ACT AERO Inhale 2 puffs into the lungs in the morning and at bedtime. 10.7 g 5   chlorpheniramine (CHLOR-TRIMETON) 4 MG tablet Take 4 mg by mouth See admin instructions. Take 4 mg in the morning and 8 mg at bedtime     Cholecalciferol (VITAMIN D) 125 MCG  (5000 UT) CAPS Take 5,000 Units by mouth daily.     EPINEPHrine  (EPIPEN ) 0.3 mg/0.3 mL IJ SOAJ injection Inject 0.3 mg into the muscle as needed for anaphylaxis. For allergic reaction     furosemide  (LASIX ) 40 MG tablet Take 1 tablet (40 mg total) by mouth 2 (two) times daily. (Patient taking differently: Take 40 mg by mouth 4 (four) times a week.) 180 tablet 3   gabapentin  (NEURONTIN ) 600 MG tablet Take 600 mg by mouth 2 (two) times daily.     linaclotide  (LINZESS ) 145 MCG CAPS capsule Take 145 mcg by mouth 2 (two) times a week. Weekends     losartan  (COZAAR ) 25 MG tablet take 1 tablet once daily. 90 tablet 3   magnesium  oxide (MAG-OX) 400 MG tablet Take 1 tablet (400 mg total) by mouth daily. 90 tablet 3   metolazone  (ZAROXOLYN ) 2.5 MG tablet take 1 tablet by mouth on monday, wednesday and friday. (Patient taking differently: Take 2.5 mg by mouth 3 (three) times a week. TAKE 1 TABLET BY MOUTH ON MONDAY, WEDNESDAY AND FRIDAY.) 36 tablet 3   montelukast  (SINGULAIR ) 10 MG tablet Take 1 tablet (10 mg total) by mouth at bedtime. 30 tablet 5   naloxone  (NARCAN ) nasal spray 4 mg/0.1 mL Place 1 spray into the nose once.     omeprazole  (PRILOSEC) 40 MG capsule Take 40 mg by mouth daily.     Oxycodone  HCl 10 MG TABS Take 1 tablet by mouth 4 (four) times daily as needed (pain).     potassium chloride  (KLOR-CON  M) 10 MEQ tablet Take 4 tablets (40 mEq total) by mouth 2 (two) times daily. (Patient taking differently: Take 40 mEq by mouth daily.) 620 tablet 3   promethazine  (PHENERGAN ) 25 MG tablet Take 1 tablet (25 mg total) by mouth every 8 (eight) hours as needed for nausea or vomiting. 10 tablet 0   rosuvastatin  (CRESTOR ) 20 MG tablet Take 1 tablet (20 mg total) by mouth daily. (Patient taking differently: Take 20 mg by mouth at bedtime.) 90 tablet 3   traZODone  (DESYREL ) 100 MG tablet Take 150 mg by mouth at bedtime.     warfarin (COUMADIN ) 5 MG tablet take one-half (1/2) tablet to 1 tablet by mouth daily or  as directed by the anticoagulation clinic. (Patient taking differently: Take 2.5-5 mg by mouth one time only at 4 PM. Take ONE-HALF (1/2) tablet M,W,F to 1 tablet the rest of the week by mouth daily or as directed by the Anticoagulation Clinic.) 75 tablet 0     Assessment: 63 y.o. female admitted with SOB, h/o Afib and aortic valve replacement to continue Coumadin .   INR 1.7 >> 3.2  PTA dose 2.5mg  MWF, and  5mg  T, TH, Sat , Sun  Goal of Therapy:  INR 2-3 Monitor platelets by anticoagulation protocol: Yes   Plan:  Hold warfarin x 1 dose Daily INR Monitor for S/S of bleeding  Elspeth Sour, PharmD Clinical Pharmacist 10/09/2023 7:58 AM

## 2023-10-09 NOTE — Progress Notes (Signed)
 Mobility Specialist Progress Note:    10/09/23 1607  Mobility  Activity Ambulated with assistance  Level of Assistance Standby assist, set-up cues, supervision of patient - no hands on  Assistive Device None  Distance Ambulated (ft) 75 ft  Range of Motion/Exercises Active;All extremities  Activity Response Tolerated well  Mobility Referral Yes  Mobility visit 1 Mobility  Mobility Specialist Start Time (ACUTE ONLY) 1540  Mobility Specialist Stop Time (ACUTE ONLY) 1607  Mobility Specialist Time Calculation (min) (ACUTE ONLY) 27 min   Pt received in bed, family in room. Agreeable to mobility, required SBA to stand and ambulate with no AD. Tolerated well, SpO2 93% on 2L during ambulation. Audible SOB and many rest breaks needed. Returned to room, all needs met.   Sherrilee Ditty Mobility Specialist Please contact via Special educational needs teacher or  Rehab office at [phone]

## 2023-10-09 NOTE — Progress Notes (Addendum)
  SATURATION QUALIFICATIONS: (This note is used to comply with regulatory documentation for home oxygen )   Patient Saturations on Room Air while Ambulating = 86%   Patient Saturations on 1 Liter of oxygen  while Ambulating = 88%   Patient Saturations on 2 Liters of oxygen  while Ambulating = 93 to 95%   DX --COPD  - Patient had dyspnea on exertion  Rendall Carwin, MD

## 2023-10-09 NOTE — Plan of Care (Signed)

## 2023-10-09 NOTE — Progress Notes (Addendum)
 SATURATION QUALIFICATIONS: (This note is used to comply with regulatory documentation for home oxygen )  Patient Saturations on Room Air while Ambulating = 86%  Patient Saturations on 1 Liter of oxygen  while Ambulating = 88%  Patient Saturations on 2 Liters of oxygen  while Ambulating = 93 to 95%  DX --COPD  Patient had dyspnea on exertion  Rendall Carwin, MD

## 2023-10-09 NOTE — Progress Notes (Signed)
 PROGRESS NOTE  Lindsay Bonilla, is a 63 y.o. female, DOB - 1960-04-05, FMW:[id number]  Admit date - 10/07/2023   Admitting Physician Lamar Dess, MD  Outpatient Primary MD for the patient is [facility], [facility]  LOS - 2  Chief Complaint  Patient presents with   Shortness of Breath      Brief Summary:- 63 y.o. female with medical history significant of aortic aneurysm, atrial flutter, advanced COPD, presence of mechanical aortic valve on chronic anticoagulation with Coumadin  and chronic hypoxic respiratory failure due to underlying COPD admitted on 10/08/2023 with worsening respiratory symptoms due to COPD flareup in setting of community-acquired pneumonia   A/p 1)CAP--no fevers, no leukocytosis, does not meet sepsis criteria - Cough, dyspnea and respiratory symptoms persist - Continue Rocephin /azithromycin  (allergic to doxycycline ) - Mucolytics, bronchodilators and IV Solu-Medrol  as ordered  2)Acute on chronic respiratory failure most likely secondary to pneumonia and COPD exacerbation -At baseline requires 2 L of oxygen  - Patient ambulated on room air desaturated to 86%--- please see separate doctor documentation  3) acute COPD exacerbation--due to 1 above -Manage as above -  4) mechanical aortic valve--on Coumadin  -Risk of cytochrome P4 50 interaction between Coumadin  and azithromycin --- however patient is allergic to doxycycline  -Monitor INR closely  # Hypertension-continue losartan    # CAD-continue aspirin  and Crestor  for secondary prevention   # Heart failure preserved ejection fraction-continue home Lasix    # GERD-continue Protonix    # Insomnia-continue trazodone    # Hypokalemia- replaced and normalized  Status is: Inpatient   Disposition: The patient is from: Home              Anticipated d/c is to: Home              Anticipated d/c date is: 1 day              Patient currently is not medically stable to d/c. Barriers: Not Clinically Stable-   Code  Status :  -  Code Status: Full Code   Family Communication:  (patient is alert, awake and coherent)   Discussed with Daughter at bedside   DVT Prophylaxis  :   - SCDs  SCDs Start: 10/07/23 2313   Lab Results  Component Value Date   PLT 272 10/09/2023   Inpatient Medications  Scheduled Meds:  arformoterol   15 mcg Nebulization BID   aspirin  EC  81 mg Oral Daily   azithromycin   500 mg Oral Daily   budesonide  (PULMICORT ) nebulizer solution  0.25 mg Nebulization BID   dextromethorphan -guaiFENesin   1 tablet Oral BID   furosemide   40 mg Oral Daily   gabapentin   600 mg Oral BID   ipratropium-albuterol   3 mL Nebulization TID   linaclotide   145 mcg Oral Weekly   losartan   25 mg Oral Daily   methylPREDNISolone  (SOLU-MEDROL ) injection  40 mg Intravenous Q12H   pantoprazole   40 mg Oral Daily   rosuvastatin   20 mg Oral Daily   traZODone   150 mg Oral QHS   Warfarin - Pharmacist Dosing Inpatient   Does not apply q1600   Continuous Infusions:  cefTRIAXone  (ROCEPHIN )  IV 2 g (10/09/23 0956)   PRN Meds:.acetaminophen  **OR** acetaminophen , ondansetron  **OR** ondansetron  (ZOFRAN ) IV, mouth rinse   Anti-infectives (From admission, onward)    Start     Dose/Rate Route Frequency Ordered Stop   10/08/23 0915  cefTRIAXone  (ROCEPHIN ) 2 g in sodium chloride  0.9 % 100 mL IVPB        2 g 200 mL/hr over 30  Minutes Intravenous Every 24 hours 10/08/23 0818     10/07/23 2315  azithromycin  (ZITHROMAX ) tablet 500 mg        500 mg Oral Daily 10/07/23 2314         Subjective: Shona Grates today has no fevers, no emesis,  No chest pain,   Daughter at bedside, questions answered - Ambulated with staff on room air O2 sat dropped to 86% patient became dyspneic - On 2 L of oxygen  O2 sats up to 93% -   Objective: Vitals:   10/09/23 0707 10/09/23 0714 10/09/23 1311 10/09/23 1413  BP:    119/73  Pulse:    88  Resp:    18  Temp:    98.2 F (36.8 C)  TempSrc:    Oral  SpO2: 98% 98% 97% 98%   Weight:      Height:        Intake/Output Summary (Last 24 hours) at 10/09/2023 1946 Last data filed at 10/09/2023 1700 Gross per 24 hour  Intake 720 ml  Output --  Net 720 ml   Filed Weights   10/07/23 2100 10/08/23 0000  Weight: 83.9 kg 88.5 kg    Physical Exam  Gen:- Awake Alert, dyspnea on exertion HEENT:- [place].AT, No sclera icterus Nose- [place] 2L/min Neck-Supple Neck,No JVD,.  Lungs-diminished breath sounds, few scattered wheezes CV- S1, S2 normal, regular , metallic valve click Abd-  +ve B.Sounds, Abd Soft, No tenderness,    Extremity/Skin:- No  edema, pedal pulses present  Psych-affect is appropriate, oriented x3 Neuro-no new focal deficits, no tremors  Data Reviewed: I have personally reviewed following labs and imaging studies  CBC: Recent Labs  Lab 10/07/23 2127 10/08/23 0503 10/09/23 0814  WBC 7.3 7.8 14.7*  NEUTROABS 4.9  --   --   HGB 13.5 13.4 12.7  HCT 40.2 40.5 39.0  MCV 88.9 88.4 92.0  PLT 239 249 272   Basic Metabolic Panel: Recent Labs  Lab 10/07/23 2127 10/08/23 0503 10/09/23 0444  NA 137 133* 135  K 3.1* 2.6* 4.8  CL 94* 92* 102  CO2 31 27 25   GLUCOSE 106* 203* 137*  BUN 15 15 20   CREATININE 0.68 0.70 0.60  CALCIUM  8.6* 8.6* 8.3*  MG 1.9  --  2.1  PHOS  --   --  3.1   GFR: Estimated Creatinine Clearance: 75.4 mL/min (by C-G formula based on SCr of 0.6 mg/dL). Liver Function Tests: Recent Labs  Lab 10/07/23 2127 10/09/23 0444  AST 28  --   ALT 28  --   ALKPHOS 58  --   BILITOT 0.7  --   PROT 6.4*  --   ALBUMIN  3.6 3.5   Recent Results (from the past 240 hours)  SARS Coronavirus 2 by RT PCR (hospital order, performed in [place] hospital lab) *cepheid single result test* Anterior Nasal Swab     Status: None   Collection Time: 10/07/23  9:49 PM   Specimen: Anterior Nasal Swab  Result Value Ref Range Status   SARS Coronavirus 2 by RT PCR NEGATIVE NEGATIVE Final    Comment: (NOTE) SARS-CoV-2 target nucleic acids are NOT  DETECTED.  The SARS-CoV-2 RNA is generally detectable in upper and lower respiratory specimens during the acute phase of infection. The lowest concentration of SARS-CoV-2 viral copies this assay can detect is 250 copies / mL. A negative result does not preclude SARS-CoV-2 infection and should not be used as the sole basis for treatment or other patient management  decisions.  A negative result may occur with improper specimen collection / handling, submission of specimen other than nasopharyngeal swab, presence of viral mutation(s) within the areas targeted by this assay, and inadequate number of viral copies (<250 copies / mL). A negative result must be combined with clinical observations, patient history, and epidemiological information.  Fact Sheet for Patients:   RoadLapTop.co.za  Fact Sheet for Healthcare Providers: http://kim-miller.com/  This test is not yet approved or  cleared by the [place]  FDA and has been authorized for detection and/or diagnosis of SARS-CoV-2 by FDA under an Emergency Use Authorization (EUA).  This EUA will remain in effect (meaning this test can be used) for the duration of the COVID-19 declaration under Section 564(b)(1) of the Act, 21 U.S.C. section 360bbb-3(b)(1), unless the authorization is terminated or revoked sooner.  Performed at [facility], [address], [place], [place] [address]     Radiology Studies: DG Chest Portable 1 View Result Date: 10/07/2023 CLINICAL DATA:  Shortness of breath EXAM: PORTABLE CHEST 1 VIEW COMPARISON:  Chest x-ray 05/13/2019 FINDINGS: There some strandy and patchy opacities in both lower lungs, left greater than right. Costophrenic angles are clear. No pneumothorax or acute fracture. The cardiomediastinal silhouette is stable and within normal limits. Patient is status post valve replacement. Cervical spinal fusion plate again. IMPRESSION: Strandy and patchy opacities  in both lower lungs, left greater than right, which may represent atelectasis or infection. Electronically Signed   By: Greig Pique M.D.   On: 10/07/2023 21:39   Scheduled Meds:  arformoterol   15 mcg Nebulization BID   aspirin  EC  81 mg Oral Daily   azithromycin   500 mg Oral Daily   budesonide  (PULMICORT ) nebulizer solution  0.25 mg Nebulization BID   dextromethorphan -guaiFENesin   1 tablet Oral BID   furosemide   40 mg Oral Daily   gabapentin   600 mg Oral BID   ipratropium-albuterol   3 mL Nebulization TID   linaclotide   145 mcg Oral Weekly   losartan   25 mg Oral Daily   methylPREDNISolone  (SOLU-MEDROL ) injection  40 mg Intravenous Q12H   pantoprazole   40 mg Oral Daily   rosuvastatin   20 mg Oral Daily   traZODone   150 mg Oral QHS   Warfarin - Pharmacist Dosing Inpatient   Does not apply q1600   Continuous Infusions:  cefTRIAXone  (ROCEPHIN )  IV 2 g (10/09/23 0956)    LOS: 2 days   Rendall Carwin M.D on 10/09/2023 at 7:46 PM  Go to [email or web address] - for contact info  [facility] - Office  [phone]  If 7PM-7AM, please contact night-coverage [email or web address] 10/09/2023, 7:46 PM

## 2023-10-10 DIAGNOSIS — J441 Chronic obstructive pulmonary disease with (acute) exacerbation: Secondary | ICD-10-CM | POA: Diagnosis not present

## 2023-10-10 LAB — PROTIME-INR
INR: 3.5 — ABNORMAL HIGH (ref 0.8–1.2)
Prothrombin Time: 36.9 s — ABNORMAL HIGH (ref 11.4–15.2)

## 2023-10-10 MED ORDER — OHTUVAYRE 3 MG/2.5ML IN SUSP
2.5000 mL | Freq: Two times a day (BID) | RESPIRATORY_TRACT | 5 refills | Status: DC
Start: 2023-10-10 — End: 2023-10-10

## 2023-10-10 MED ORDER — ALBUTEROL SULFATE HFA 108 (90 BASE) MCG/ACT IN AERS
2.0000 | INHALATION_SPRAY | RESPIRATORY_TRACT | 5 refills | Status: AC | PRN
Start: 2023-10-10 — End: ?

## 2023-10-10 MED ORDER — PREDNISONE 20 MG PO TABS: 40.0000 mg | ORAL_TABLET | Freq: Every day | ORAL | 0 refills | Status: AC

## 2023-10-10 MED ORDER — AZITHROMYCIN 500 MG PO TABS: 500.0000 mg | ORAL_TABLET | Freq: Every day | ORAL | 0 refills | Status: AC

## 2023-10-10 MED ORDER — ARFORMOTEROL TARTRATE 15 MCG/2ML IN NEBU: 15.0000 ug | INHALATION_SOLUTION | Freq: Two times a day (BID) | RESPIRATORY_TRACT | 3 refills | Status: DC

## 2023-10-10 MED ORDER — ALBUTEROL SULFATE (2.5 MG/3ML) 0.083% IN NEBU: 2.5000 mg | INHALATION_SOLUTION | RESPIRATORY_TRACT | 5 refills | Status: DC | PRN

## 2023-10-10 MED ORDER — IPRATROPIUM-ALBUTEROL 0.5-2.5 (3) MG/3ML IN SOLN: 3.0000 mL | Freq: Three times a day (TID) | RESPIRATORY_TRACT | 5 refills | Status: DC

## 2023-10-10 MED ORDER — OHTUVAYRE 3 MG/2.5ML IN SUSP
2.5000 mL | Freq: Two times a day (BID) | RESPIRATORY_TRACT | 5 refills | Status: AC
Start: 2023-10-10 — End: ?

## 2023-10-10 MED ORDER — GUAIFENESIN ER 600 MG PO TB12: 600.0000 mg | ORAL_TABLET | Freq: Two times a day (BID) | ORAL | 2 refills | Status: AC

## 2023-10-10 MED ORDER — CEFDINIR 300 MG PO CAPS: 300.0000 mg | ORAL_CAPSULE | Freq: Two times a day (BID) | ORAL | 0 refills | Status: AC

## 2023-10-10 MED ORDER — OMEPRAZOLE 40 MG PO CPDR: 40.0000 mg | DELAYED_RELEASE_CAPSULE | Freq: Every day | ORAL | 5 refills | Status: AC

## 2023-10-10 NOTE — Discharge Instructions (Signed)
 1)Avoid ibuprofen/Advil/Aleve/Motrin/Goody Powders/Naproxen/BC powders/Meloxicam/Diclofenac/Indomethacin and other Nonsteroidal anti-inflammatory medications as these will make you more likely to bleed and can cause stomach ulcers, can also cause Kidney problems.   2)Watch for bleeding while on Blood Thinners--watch for blood in your stool which can make your stool black, maroon, mahogany or red---, blood in your urine which can make your urine pink or red, nosebleeds , also watch for possible bruising -You are taking Coumadin Lindsay Bonilla-- which is a blood thinner--- be careful to avoid injury or falls - Please recheck PT/INR  Blood Test on Monday, 10/16/2023  3)you need oxygen  at home at 2 L via nasal cannula continuously while awake and while asleep--- smoking or having open fires around oxygen  can cause fire, significant injury and death

## 2023-10-10 NOTE — TOC Transition Note (Signed)
 Transition of Care [facility]) - Discharge Note   Patient Details  Name: Lindsay Bonilla MRN: [id number] Date of Birth: [birth year]  [facility] CM/SW Contact:  Mcarthur Saddie Kim, LCSW Phone Number: 10/10/2023, 11:59 AM   Clinical Narrative:  Pt requesting wheelchair. No preference on agency. Referred to Zach with [employer] to drop ship to pt's home. Zach aware of d/c today. MD notified for order.      Final next level of care: Home/Self Care Barriers to Discharge: Barriers Resolved   Patient Goals and CMS Choice            Discharge Placement                       Discharge Plan and Services Additional resources added to the After Visit Summary for                  DME Arranged: Wheelchair manual DME Agency: [employer] Date DME Agency Contacted: 10/10/23 Time DME Agency Contacted: [id number] Representative spoke with at DME Agency: Darlyn            Social Drivers of Health (SDOH) Interventions SDOH Screenings   Food Insecurity: No Food Insecurity (10/07/2023)  Housing: Low Risk  (10/08/2023)  Transportation Needs: No Transportation Needs (10/07/2023)  Utilities: Not At Risk (10/07/2023)  Financial Resource Strain: Low Risk  (11/07/2022)   Received from [facility]  Physical Activity: Inactive (11/07/2022)   Received from [facility]  Social Connections: Socially Integrated (11/07/2022)   Received from [facility]  Stress: No Stress Concern Present (11/07/2022)   Received from [facility]  Tobacco Use: Medium Risk (10/07/2023)  Health Literacy: Low Risk  (11/07/2022)   Received from [facility]     Readmission Risk Interventions    10/08/2023    4:17 PM  Readmission Risk Prevention Plan  Transportation Screening Complete  PCP or Specialist Appt within 5-7 Days Complete  Home Care Screening Complete  Medication Review (RN CM) Complete

## 2023-10-10 NOTE — Progress Notes (Signed)
 PHARMACY - ANTICOAGULATION CONSULT NOTE  Pharmacy Consult for Coumadin  Indication: atrial fibrillation  Allergies  Allergen Reactions   Alpha-Gal Other (See Comments)    Red meat   Beta Adrenergic Blockers Anaphylaxis   Doxycycline  Swelling and Other (See Comments)    Dizziness, sleepy, talking out of my head, almost caused liver failure   Vancomycin  Swelling and Rash    Severe swelling   Avelox [Moxifloxacin Hcl In Nacl] Rash   Ciprofloxacin Nausea And Vomiting   Cleocin [Clindamycin Hcl] Rash   Moxifloxacin Rash   Sulfamethoxazole-Trimethoprim Rash   Tape Other (See Comments)    White clear itches, rash ONLY use paper tape    Patient Measurements: Height: 5' 2 (157.5 cm) Weight: 88.5 kg (195 lb 1.7 oz) IBW/kg (Calculated) : 50.1 HEPARIN  DW (KG): 69  Vital Signs: Temp: 97.8 F (36.6 C) (08/12 0511) Temp Source: Oral (08/12 0511) BP: 124/82 (08/12 0511) Pulse Rate: 80 (08/12 0511)  Labs: Recent Labs    10/07/23 2127 10/08/23 0503 10/09/23 0444 10/09/23 0814 10/10/23 0439  HGB 13.5 13.4  --  12.7  --   HCT 40.2 40.5  --  39.0  --   PLT 239 249  --  272  --   LABPROT 22.2* 21.2* 34.4*  --  36.9*  INR 1.8* 1.7* 3.2*  --  3.5*  CREATININE 0.68 0.70 0.60  --   --     Estimated Creatinine Clearance: 75.4 mL/min (by C-G formula based on SCr of 0.6 mg/dL).   Medical History: Past Medical History:  Diagnosis Date   Anxiety    Aortic aneurysm (HCC)    Aortic insufficiency    a. s/p [place] with mechanical AVR 4/17 >> FU Echo 6/17: Mild LVH, EF 60-65%, normal wall motion, ventricular septum with paradoxical septal motion, [employer] mechanical AVR functioning  normally with no regurgitation (mean 6 mmHg), MAC, trivial MR, mild RVE, trivial TR   Aortic stenosis, severe    Bicuspid valve   Arthritis    Asthma    Atrial flutter (HCC)    Bitten or stung by nonvenomous insect and other nonvenomous arthropods, initial encounter    Bronchitis    Chronic diastolic  CHF (congestive heart failure) (HCC)    Chronic respiratory failure with hypoxia (HCC)    COPD (chronic obstructive pulmonary disease) (HCC)    DDD (degenerative disc disease), cervical    Depression    Endocarditis, valve unspecified    GERD (gastroesophageal reflux disease)    Hard of hearing    Heart failure (HCC)    Heart failure, unspecified (HCC)    Hemorrhage, not elsewhere classified    History of pneumonia    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   Current Outpatient Medications on File Prior to Encounter  Medication Sig Dispense Refill  albuterol  (PROVENTIL ) (2.5 MG/3ML) 0.083% nebulizer solution Take 3 mLs (2.5 mg total) by nebulization every 4 (four) hours as needed for wheezing or shortness of breath. 540 mL 5   albuterol  (VENTOLIN  HFA) 108 (90 Base) MCG/ACT inhaler Inhale 2 puffs into the lungs every 6 (six) hours as needed for wheezing or shortness of breath. 18 g 5   Ascorbic Acid (VITAMIN C ER PO) Take 2,000 mg by mouth daily.     aspirin  EC 81 MG tablet Take 1 tablet (81 mg total) by mouth daily. (Patient taking differently: Take 81 mg by mouth at bedtime.)     budeson-glycopyrrolate -formoterol  (BREZTRI  AEROSPHERE) 160-9-4.8 MCG/ACT AERO Inhale 2 puffs into the lungs in the morning and at bedtime. 10.7 g 5   chlorpheniramine (CHLOR-TRIMETON) 4 MG tablet Take 4 mg by mouth See admin  instructions. Take 4 mg in the morning and 8 mg at bedtime     Cholecalciferol (VITAMIN D) 125 MCG (5000 UT) CAPS Take 5,000 Units by mouth daily.     EPINEPHrine  (EPIPEN ) 0.3 mg/0.3 mL IJ SOAJ injection Inject 0.3 mg into the muscle as needed for anaphylaxis. For allergic reaction     furosemide  (LASIX ) 40 MG tablet Take 1 tablet (40 mg total) by mouth 2 (two) times daily. (Patient taking differently: Take 40 mg by mouth 4 (four) times a week.) 180 tablet 3   gabapentin  (NEURONTIN ) 600 MG tablet Take 600 mg by mouth 2 (two) times daily.     linaclotide  (LINZESS ) 145 MCG CAPS capsule Take 145 mcg by mouth 2 (two) times a week. Weekends     losartan  (COZAAR ) 25 MG tablet take 1 tablet once daily. 90 tablet 3   magnesium  oxide (MAG-OX) 400 MG tablet Take 1 tablet (400 mg total) by mouth daily. 90 tablet 3   metolazone  (ZAROXOLYN ) 2.5 MG tablet take 1 tablet by mouth on monday, wednesday and friday. (Patient taking differently: Take 2.5 mg by mouth 3 (three) times a week. TAKE 1 TABLET BY MOUTH ON MONDAY, WEDNESDAY AND FRIDAY.) 36 tablet 3   montelukast  (SINGULAIR ) 10 MG tablet Take 1 tablet (10 mg total) by mouth at bedtime. 30 tablet 5   naloxone  (NARCAN ) nasal spray 4 mg/0.1 mL Place 1 spray into the nose once.     omeprazole  (PRILOSEC) 40 MG capsule Take 40 mg by mouth daily.     Oxycodone  HCl 10 MG TABS Take 1 tablet by mouth 4 (four) times daily as needed (pain).     potassium chloride  (KLOR-CON  M) 10 MEQ tablet Take 4 tablets (40 mEq total) by mouth 2 (two) times daily. (Patient taking differently: Take 40 mEq by mouth daily.) 620 tablet 3   promethazine  (PHENERGAN ) 25 MG tablet Take 1 tablet (25 mg total) by mouth every 8 (eight) hours as needed for nausea or vomiting. 10 tablet 0   rosuvastatin  (CRESTOR ) 20 MG tablet Take 1 tablet (20 mg total) by mouth daily. (Patient taking differently: Take 20 mg by mouth at bedtime.) 90 tablet 3   traZODone  (DESYREL ) 100 MG tablet Take 150 mg by mouth at  bedtime.     warfarin (COUMADIN ) 5 MG tablet take one-half (1/2) tablet to 1 tablet by mouth daily or as directed by the anticoagulation clinic. (Patient taking differently: Take 2.5-5 mg by mouth one time only at 4 PM. Take ONE-HALF (1/2) tablet M,W,F to 1 tablet the rest of the week by mouth daily or as directed by the Anticoagulation Clinic.) 75 tablet 0  Assessment: 63 y.o. female admitted with SOB, h/o Afib and aortic valve replacement to continue Coumadin .   INR 1.7 >> 3.2>>3.5  PTA dose 2.5mg  MWF, and 5mg  T, TH, Sat , Sun  Goal of Therapy:  INR 2-3 Monitor platelets by anticoagulation protocol: Yes   Plan:  Hold warfarin x 1 dose Daily INR Monitor for S/S of bleeding  Elspeth Sour, PharmD Clinical Pharmacist 10/10/2023 8:52 AM

## 2023-10-10 NOTE — Discharge Summary (Signed)
 Lindsay Bonilla, is a 63 y.o. female  DOB Sep 25, 1960  MRN [id number].  Admission date:  10/07/2023  Admitting Physician  Lamar Dess, MD  Discharge Date:  10/10/2023   Primary MD  P[facility], [facility]  Recommendations for primary care physician for things to follow:  1)Avoid ibuprofen/Advil/Aleve/Motrin/Goody Powders/Naproxen/BC powders/Meloxicam/Diclofenac/Indomethacin and other Nonsteroidal anti-inflammatory medications as these will make you more likely to bleed and can cause stomach ulcers, can also cause Kidney problems.   2)Watch for bleeding while on Blood Thinners--watch for blood in your stool which can make your stool black, maroon, mahogany or red---, blood in your urine which can make your urine pink or red, nosebleeds , also watch for possible bruising -You are taking Coumadin [name]-- which is a blood thinner--- be careful to avoid injury or falls - Please recheck PT/INR  Blood Test on Monday, 10/16/2023  3)you need oxygen  at home at 2 L via nasal cannula continuously while awake and while asleep--- smoking or having open fires around oxygen  can cause fire, significant injury and death  Admission Diagnosis  COPD with acute exacerbation (HCC) [J44.1]   Discharge Diagnosis  COPD with acute exacerbation (HCC) [J44.1]    Principal Problem:   COPD with acute exacerbation (HCC)      Past Medical History:  Diagnosis Date   Anxiety    Aortic aneurysm (HCC)    Aortic insufficiency    a. s/p Bent[place] with mechanical AVR 4/17 >> FU Echo 6/17: Mild LVH, EF 60-65%, normal wall motion, ventricular septum with paradoxical septal motion, St. Jude mechanical AVR functioning  normally with no regurgitation (mean 6 mmHg), MAC, trivial MR, mild RVE, trivial TR   Aortic stenosis, severe    Bicuspid valve   Arthritis    Asthma    Atrial flutter (HCC)    Bitten or stung by nonvenomous insect and  other nonvenomous arthropods, initial encounter    Bronchitis    Chronic diastolic CHF (congestive heart failure) (HCC)    Chronic respiratory failure with hypoxia (HCC)    COPD (chronic obstructive pulmonary disease) (HCC)    DDD (degenerative disc disease), cervical    Depression    Endocarditis, valve unspecified    GERD (gastroesophageal reflux disease)    Hard of hearing    Heart failure (HCC)    Heart failure, unspecified (HCC)    Hemorrhage, not elsewhere classified    History of pneumonia    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   Hyperkalemia    Low back pain    Migraine headache    Mild CAD    Other chronic pain    Palpitations    PAT (paroxysmal atrial tachycardia) (HCC)    PONV (postoperative nausea and vomiting)    only once   Premature atrial contractions    PVC's (premature ventricular contractions)    Shortness of breath    Nodule left lung CT done 8/6   Sinus bradycardia    Stress incontinence    Unspecified osteoarthritis, unspecified site  Valvular heart disease     Past Surgical History:  Procedure Laterality Date   ABDOMINAL HYSTERECTOMY     ANTERIOR CERVICAL DECOMP/DISCECTOMY FUSION  10/05/2011   Procedure: ANTERIOR CERVICAL DECOMPRESSION/DISCECTOMY FUSION 2 LEVELS;  Surgeon: Catalina CHRISTELLA Stains, MD;  Location: [facility];  Service: Neurosurgery;  Laterality: N/A;  Cervical five - six , six - seven Anterior cervical decompression/diskectomy/fusion/plate   ANTERIOR CRUCIATE LIGAMENT REPAIR Right    AORTIC VALVE REPLACEMENT     ARTHROSCOPIC REPAIR ACL  rt   BENTALL PROCEDURE N/A 06/15/2015   Procedure: BENTALL PROCEDURE; HEMI-ARCH REPAIR WITH #23 ST JUDE MECHANICAL AVR CONDUIT AND #28 HEMASHIELD PLATINUM GRAFT;  Surgeon: Dorise MARLA Fellers, MD;  Location: [facility] OR;  Service: Open Heart Surgery;  Laterality: N/A;   CARDIAC CATHETERIZATION N/A 05/21/2015   Procedure: Right/Left Heart Cath and Coronary Angiography;  Surgeon: Lonni JONETTA Cash, MD;  Location: [facility] INVASIVE CV LAB;  Service: Cardiovascular;  Laterality: N/A;   CHOLECYSTECTOMY     COLONOSCOPY  10/13/05   COLONOSCOPY WITH PROPOFOL  N/A 07/22/2021   Procedure: COLONOSCOPY WITH PROPOFOL ;  Surgeon: Federico Rosario BROCKS, MD;  Location: [facility];  Service: Gastroenterology;  Laterality: N/A;   DIAGNOSTIC LAPAROSCOPY     HEMOSTASIS CLIP PLACEMENT  07/22/2021   Procedure: HEMOSTASIS CLIP PLACEMENT;  Surgeon: Federico Rosario BROCKS, MD;  Location: [facility];  Service: Gastroenterology;;   JOINT REPLACEMENT Right 2016   KNEE ARTHROSCOPY     rt x4  x1 lft   MYRINGOPLASTY W/ FAT GRAFT     graft from behind ear   POLYPECTOMY  07/22/2021   Procedure: POLYPECTOMY;  Surgeon: Federico Rosario BROCKS, MD;  Location: [facility];  Service: Gastroenterology;;   [place]  10/13/05, 09/19/05   TEE WITHOUT CARDIOVERSION N/A 06/15/2015   Procedure: TRANSESOPHAGEAL ECHOCARDIOGRAM (TEE);  Surgeon: Dorise MARLA Fellers, MD;  Location: [facility] OR;  Service: Open Heart Surgery;  Laterality: N/A;   TONSILLECTOMY  2005   TUBAL LIGATION     TYMPANOSTOMY TUBE PLACEMENT         HPI  from the history and physical done on the day of admission:   Chief Complaint:  sob   HPI: Lindsay Bonilla is a 63 y.o. female with medical history significant of aortic aneurysm, atrial flutter, COPD, heart failure who presented to the emergency department due to shortness of breath.  Patient has a history of COPD and intermittently requires oxygen  at night.  She is having worsening wheezing and shortness of breath over the last week.  She was given outpatient course of antibiotics and steroids that improvement.  She was unable to walk across the room so she presented to the ER for further assessment.  On arrival she was endorsing a productive cough.  She was afebrile hemodynamically stable.  Labs were obtained on presentation which showed WBC 7.3, hemoglobin 13.5, platelets 239, potassium 3.1, magnesium  1.9, BNP 70, INR 1.8, COVID flu  negative.  Patient underwent chest x-ray which showed patchy opacities in both lungs concerning for atelectasis versus infection.  Due to oxygen  requirement and concern for worsening COPD patient was admitted for further workup.  On admission she had mild wheezing but it responded to nebulizers.     Review of Systems: Review of Systems     Hospital Course:     Brief Summary:- 63 y.o. female with medical history significant of aortic aneurysm, atrial flutter, advanced COPD, presence of mechanical aortic valve on chronic anticoagulation with Coumadin  and chronic hypoxic respiratory failure due  to underlying COPD admitted on 10/08/2023 with worsening respiratory symptoms due to COPD flareup in setting of community-acquired pneumonia     A/p 1)CAP--no fevers, no leukocytosis, does Not meet sepsis criteria - Cough, dyspnea and respiratory symptoms significantly - Treated with Rocephin /azithromycin  (allergic to doxycycline ) - Mucolytics, bronchodilators and IV Solu-Medrol  as ordered   2)Acute on chronic respiratory failure most likely secondary to pneumonia and COPD exacerbation -At baseline requires 2 L of oxygen  - Patient ambulated on room air desaturated to 86%--- please see separate doctor documentation   3) acute COPD exacerbation--due to 1 above Advanced COPD with frequent exacerbations -Patient will be  good candidate for Ohtuyvare- -treated with IV Solu-Medrol  -P.o. prednisone  as ordered -Manage as above -Outpatient follow-up with pulmonologist Dr. Harden Staff advised -  4) mechanical aortic valve--on Coumadin  -Risk of cytochrome P4 50 interaction between Coumadin  and azithromycin --- however patient is allergic to doxycycline  -INR trending up hold Coumadin  for the next 36 hours as advised -Monitor INR closely as outpatient as advised   # Hypertension-continue losartan    # CAD-continue aspirin  and Crestor  for secondary prevention   # Heart failure preserved ejection  fraction-continue home Lasix    # GERD-continue Protonix    # Insomnia-continue trazodone    # Hypokalemia- replaced and normalized  Class II obesity- -Low calorie diet, portion control and increase physical activity discussed with patient -Body mass index is 35.69 kg/m.   Disposition: The patient is from: Home              Anticipated d/c is to: Home  Discharge Condition: Stable  Follow UP   Follow-up Information     [facility]. Schedule an appointment as soon as possible for a visit in 1 week(s).   Contact information: [address] [place] [address] [phone]                 Diet and Activity recommendation:  As advised  Discharge Instructions    Discharge Instructions     Call MD for:  difficulty breathing, headache or visual disturbances   Complete by: As directed    Call MD for:  persistant dizziness or light-headedness   Complete by: As directed    Call MD for:  persistant nausea and vomiting   Complete by: As directed    Call MD for:  temperature >100.4   Complete by: As directed    Diet - low sodium heart healthy   Complete by: As directed    Discharge instructions   Complete by: As directed    1)Avoid ibuprofen/Advil/Aleve/Motrin/Goody Powders/Naproxen/BC powders/Meloxicam/Diclofenac/Indomethacin and other Nonsteroidal anti-inflammatory medications as these will make you more likely to bleed and can cause stomach ulcers, can also cause Kidney problems.   2)Watch for bleeding while on Blood Thinners--watch for blood in your stool which can make your stool black, maroon, mahogany or red---, blood in your urine which can make your urine pink or red, nosebleeds , also watch for possible bruising -You are taking Coumadin [name]-- which is a blood thinner--- be careful to avoid injury or falls - Please recheck PT/INR  Blood Test on Monday, 10/16/2023  3)you need oxygen  at home at 2 L via nasal cannula continuously while awake and while  asleep--- smoking or having open fires around oxygen  can cause fire, significant injury and death   For home use only DME standard manual wheelchair with seat cushion   Complete by: As directed    Patient suffers from advanced COPD with chronic hypoxic respiratory failure with  chronic dyspnea on exertion which impairs their ability to perform daily activities like grooming and toileting in the home.  A cane, crutch, or walker will not resolve issue with performing activities of daily living. A wheelchair will allow patient to safely perform daily activities. Patient can safely propel the wheelchair in the home or has a caregiver who can provide assistance. Length of need Lifetime. Accessories: elevating leg rests (ELRs), wheel locks, extensions and anti-tippers. --Diagnosis---advanced COPD with chronic hypoxic respiratory failure with chronic dyspnea on exertion   Increase activity slowly   Complete by: As directed        Discharge Medications     Allergies as of 10/10/2023       Reactions   Alpha-gal Other (See Comments)   Red meat   Beta Adrenergic Blockers Anaphylaxis   Doxycycline  Swelling, Other (See Comments)   Dizziness, sleepy, talking out of my head, almost caused liver failure   Vancomycin  Swelling, Rash   Severe swelling   Avelox [moxifloxacin Hcl In Nacl] Rash   Ciprofloxacin Nausea And Vomiting   Cleocin [clindamycin Hcl] Rash   Moxifloxacin Rash   Sulfamethoxazole-trimethoprim Rash   Tape Other (See Comments)   White clear itches, rash ONLY use paper tape        Medication List     PAUSE taking these medications    warfarin 5 MG tablet Wait to take this until: October 12, 2023 Commonly known as: COUMADIN  Take as directed. If you are unsure how to take this medication, talk to your nurse or doctor. Original instructions: take one-half (1/2) tablet to 1 tablet by mouth daily or as directed by the anticoagulation clinic. What changed: See the new  instructions.       STOP taking these medications    aspirin  EC 81 MG tablet   Breztri  Aerosphere 160-9-4.8 MCG/ACT Aero inhaler Generic drug: budesonide -glycopyrrolate -formoterol        TAKE these medications    albuterol  (2.5 MG/3ML) 0.083% nebulizer solution Commonly known as: PROVENTIL  Take 3 mLs (2.5 mg total) by nebulization every 4 (four) hours as needed for wheezing or shortness of breath. What changed: Another medication with the same name was changed. Make sure you understand how and when to take each.   albuterol  108 (90 Base) MCG/ACT inhaler Commonly known as: VENTOLIN  HFA Inhale 2 puffs into the lungs every 4 (four) hours as needed for wheezing or shortness of breath. What changed: when to take this   arformoterol  15 MCG/2ML Nebu Commonly known as: BROVANA  Take 2 mLs (15 mcg total) by nebulization 2 (two) times daily.   azithromycin  500 MG tablet Commonly known as: ZITHROMAX  Take 1 tablet (500 mg total) by mouth daily for 3 days.   cefdinir  300 MG capsule Commonly known as: OMNICEF  Take 1 capsule (300 mg total) by mouth 2 (two) times daily for 5 days.   chlorpheniramine 4 MG tablet Commonly known as: CHLOR-TRIMETON Take 4 mg by mouth See admin instructions. Take 4 mg in the morning and 8 mg at bedtime   EpiPen  0.3 MG/0.3ML Soaj injection Generic drug: EPINEPHrine  Inject 0.3 mg into the muscle as needed for anaphylaxis. For allergic reaction   furosemide  40 MG tablet Commonly known as: LASIX  Take 1 tablet (40 mg total) by mouth 2 (two) times daily. What changed: when to take this   gabapentin  600 MG tablet Commonly known as: NEURONTIN  Take 600 mg by mouth 2 (two) times daily.   guaiFENesin  600 MG 12 hr tablet Commonly known as: Mucinex   Take 1 tablet (600 mg total) by mouth 2 (two) times daily.   ipratropium-albuterol  0.5-2.5 (3) MG/3ML Soln Commonly known as: DUONEB Take 3 mLs by nebulization 3 (three) times daily.   linaclotide  145 MCG Caps  capsule Commonly known as: LINZESS  Take 145 mcg by mouth 2 (two) times a week. Weekends   losartan  25 MG tablet Commonly known as: COZAAR  take 1 tablet once daily.   magnesium  oxide 400 MG tablet Commonly known as: MAG-OX Take 1 tablet (400 mg total) by mouth daily.   metolazone  2.5 MG tablet Commonly known as: ZAROXOLYN  take 1 tablet by mouth on monday, wednesday and friday. What changed: See the new instructions.   montelukast  10 MG tablet Commonly known as: SINGULAIR  Take 1 tablet (10 mg total) by mouth at bedtime.   naloxone  4 MG/0.1ML Liqd nasal spray kit Commonly known as: NARCAN  Place 1 spray into the nose once.   Ohtuvayre  3 MG/2.5ML Susp Generic drug: Ensifentrine  Inhale 2.5 mLs into the lungs 2 (two) times daily. Nebulizer bid   omeprazole  40 MG capsule Commonly known as: PRILOSEC Take 1 capsule (40 mg total) by mouth daily.   Oxycodone  HCl 10 MG Tabs Take 1 tablet by mouth 4 (four) times daily as needed (pain).   potassium chloride  10 MEQ tablet Commonly known as: KLOR-CON  M Take 4 tablets (40 mEq total) by mouth 2 (two) times daily. What changed: when to take this   predniSONE  20 MG tablet Commonly known as: DELTASONE  Take 2 tablets (40 mg total) by mouth daily with breakfast for 5 days.   promethazine  25 MG tablet Commonly known as: PHENERGAN  Take 1 tablet (25 mg total) by mouth every 8 (eight) hours as needed for nausea or vomiting.   rosuvastatin  20 MG tablet Commonly known as: CRESTOR  Take 1 tablet (20 mg total) by mouth daily. What changed: when to take this   traZODone  100 MG tablet Commonly known as: DESYREL  Take 150 mg by mouth at bedtime.   VITAMIN C ER PO Take 2,000 mg by mouth daily.   Vitamin D 125 MCG (5000 UT) Caps Take 5,000 Units by mouth daily.               Durable Medical Equipment  (From admission, onward)           Start     Ordered   10/10/23 0000  For home use only DME standard manual wheelchair with  seat cushion       Comments: Patient suffers from advanced COPD with chronic hypoxic respiratory failure with chronic dyspnea on exertion which impairs their ability to perform daily activities like grooming and toileting in the home.  A cane, crutch, or walker will not resolve issue with performing activities of daily living. A wheelchair will allow patient to safely perform daily activities. Patient can safely propel the wheelchair in the home or has a caregiver who can provide assistance. Length of need Lifetime. Accessories: elevating leg rests (ELRs), wheel locks, extensions and anti-tippers. --Diagnosis---advanced COPD with chronic hypoxic respiratory failure with chronic dyspnea on exertion   10/10/23 1157           Major procedures and Radiology Reports - PLEASE review detailed and final reports for all details, in brief -   DG Chest Portable 1 View Result Date: 10/07/2023 CLINICAL DATA:  Shortness of breath EXAM: PORTABLE CHEST 1 VIEW COMPARISON:  Chest x-ray 05/13/2019 FINDINGS: There some strandy and patchy opacities in both lower lungs, left greater than right. Costophrenic  angles are clear. No pneumothorax or acute fracture. The cardiomediastinal silhouette is stable and within normal limits. Patient is status post valve replacement. Cervical spinal fusion plate again. IMPRESSION: Strandy and patchy opacities in both lower lungs, left greater than right, which may represent atelectasis or infection. Electronically Signed   By: Greig Pique M.D.   On: 10/07/2023 21:39    Micro Results  Recent Results (from the past 240 hours)  SARS Coronavirus 2 by RT PCR (hospital order, performed in [facility] hospital lab) *cepheid single result test* Anterior Nasal Swab     Status: None   Collection Time: 10/07/23  9:49 PM   Specimen: Anterior Nasal Swab  Result Value Ref Range Status   SARS Coronavirus 2 by RT PCR NEGATIVE NEGATIVE Final    Comment: (NOTE) SARS-CoV-2 target nucleic acids  are NOT DETECTED.  The SARS-CoV-2 RNA is generally detectable in upper and lower respiratory specimens during the acute phase of infection. The lowest concentration of SARS-CoV-2 viral copies this assay can detect is 250 copies / mL. A negative result does not preclude SARS-CoV-2 infection and should not be used as the sole basis for treatment or other patient management decisions.  A negative result may occur with improper specimen collection / handling, submission of specimen other than nasopharyngeal swab, presence of viral mutation(s) within the areas targeted by this assay, and inadequate number of viral copies (<250 copies / mL). A negative result must be combined with clinical observations, patient history, and epidemiological information.  Fact Sheet for Patients:   RoadLapTop.co.za  Fact Sheet for Healthcare Providers: http://kim-miller.com/  This test is not yet approved or  cleared by the [place]  FDA and has been authorized for detection and/or diagnosis of SARS-CoV-2 by FDA under an Emergency Use Authorization (EUA).  This EUA will remain in effect (meaning this test can be used) for the duration of the COVID-19 declaration under Section 564(b)(1) of the Act, 21 U.S.C. section 360bbb-3(b)(1), unless the authorization is terminated or revoked sooner.  Performed at [facility], [address], [place], [place] [address]     Today   Subjective    Trenee Igoe today has no new complaints -No fever  Or chills   No Nausea, Vomiting or Diarrhea  -- Cough, dyspnea and oxygen  requirement has improved    Patient has been seen and examined prior to discharge   Objective   Blood pressure 124/82, pulse 80, temperature 97.8 F (36.6 C), temperature source Oral, resp. rate 18, height 5' 2 (1.575 m), weight 88.5 kg, SpO2 95%.   Exam Gen:- Awake Alert, no acute distress  HEENT:- [place].AT, No sclera icterus Nose- [place]  2L/min Neck-Supple Neck,No JVD,.  Lungs-improving air movement, no significant wheezing CV- S1, S2 normal, regular Abd-  +ve B.Sounds, Abd Soft, No tenderness,    Extremity/Skin:- No  edema,   good pulses Psych-affect is appropriate, oriented x3 Neuro-no new focal deficits, no tremors    Data Review   CBC w Diff:  Lab Results  Component Value Date   WBC 14.7 (H) 10/09/2023   HGB 12.7 10/09/2023   HGB 15.3 08/11/2022   HCT 39.0 10/09/2023   HCT 44.3 08/11/2022   PLT 272 10/09/2023   PLT 283 08/11/2022   LYMPHOPCT 22 10/07/2023   MONOPCT 8 10/07/2023   EOSPCT 3 10/07/2023   BASOPCT 0 10/07/2023    CMP:  Lab Results  Component Value Date   NA 135 10/09/2023   NA 139 08/11/2022   K  4.8 10/09/2023   CL 102 10/09/2023   CO2 25 10/09/2023   BUN 20 10/09/2023   BUN 17 08/11/2022   CREATININE 0.60 10/09/2023   CREATININE 0.85 10/22/2015   PROT 6.4 (L) 10/07/2023   PROT 6.2 02/15/2023   ALBUMIN  3.5 10/09/2023   ALBUMIN  4.3 02/15/2023   BILITOT 0.7 10/07/2023   BILITOT 0.5 02/15/2023   ALKPHOS 58 10/07/2023   AST 28 10/07/2023   ALT 28 10/07/2023  .  Total Discharge time is about 33 minutes  Rendall Carwin M.D on 10/10/2023 at 7:49 PM  Go to [email or web address] -  for contact info  [facility] - Office  [phone]

## 2023-10-10 NOTE — Plan of Care (Signed)
  Problem: Education: Goal: Knowledge of General Education information will improve Description: Including pain rating scale, medication(s)/side effects and non-pharmacologic comfort measures Outcome: Progressing   Problem: Health Behavior/Discharge Planning: Goal: Ability to manage health-related needs will improve Outcome: Progressing   Problem: Clinical Measurements: Goal: Will remain free from infection Outcome: Progressing Goal: Diagnostic test results will improve Outcome: Progressing   Problem: Activity: Goal: Risk for activity intolerance will decrease Outcome: Progressing   Problem: Coping: Goal: Level of anxiety will decrease Outcome: Progressing   Problem: Pain Managment: Goal: General experience of comfort will improve and/or be controlled Outcome: Progressing   Problem: Safety: Goal: Ability to remain free from injury will improve Outcome: Progressing   Problem: Skin Integrity: Goal: Risk for impaired skin integrity will decrease Outcome: Progressing

## 2023-10-10 NOTE — Progress Notes (Signed)
 Patient suffers from CHF and COPD which impairs their ability to perform daily activities like ambulating in the home.  A walker will not resolve issue with performing activities of daily living. A wheelchair will allow patient to safely perform daily activities. Patient can safely propel the wheelchair in the home or has a caregiver who can provide assistance.

## 2023-10-11 NOTE — TOC Transition Note (Signed)
 Transition of Care [facility]) - Discharge Note   Patient Details  Name: Lindsay Bonilla MRN: [id number] Date of Birth: Nov 07, 1960  [facility] CM/SW Contact:  Sharlyne Stabs, RN Phone Number: 10/11/2023, 3:58 PM   Clinical Narrative:   Patient discharged home yesterday. MD prescribed Ohtuvayre .   CM completed Patient enrollment and consent form and faxed to company.    Barriers to Discharge: Barriers Resolved    Discharge Plan and Services Additional resources added to the After Visit Summary for                  DME Arranged: Wheelchair manual DME Agency: [employer] Date DME Agency Contacted: 10/10/23 Time DME Agency Contacted: 1159 Representative spoke with at DME Agency: Darlyn     Social Drivers of Health (SDOH) Interventions SDOH Screenings   Food Insecurity: No Food Insecurity (10/07/2023)  Housing: Low Risk  (10/08/2023)  Transportation Needs: No Transportation Needs (10/07/2023)  Utilities: Not At Risk (10/07/2023)  Financial Resource Strain: Low Risk  (11/07/2022)   Received from [facility]  Physical Activity: Inactive (11/07/2022)   Received from [facility]  Social Connections: Socially Integrated (11/07/2022)   [facility] from [facility]  Stress: No Stress Concern Present (11/07/2022)   Received from [facility]  Tobacco Use: Medium Risk (10/07/2023)  Health Literacy: Low Risk  (11/07/2022)   Received from [facility]     Readmission Risk Interventions    10/08/2023    4:17 PM  Readmission Risk Prevention Plan  Transportation Screening Complete  PCP or Specialist Appt within 5-7 Days Complete  Home Care Screening Complete  Medication Review (RN CM) Complete

## 2023-10-26 ENCOUNTER — Telehealth: Payer: Self-pay | Admitting: Pulmonary Disease

## 2023-10-26 NOTE — Telephone Encounter (Signed)
 Copied from CRM [id number]. Topic: General - Other >> Oct 25, 2023 11:36 AM Benton O wrote: Reason for RMF:fpwib from [employer]  received order but it was on there that the patient did not qualify needing to know if they still want patient on oxygen  and if so then need the rx signed and chart notes showing use and benefits sent back  Fax number [phone] >> Oct 25, 2023  1:29 PM Mary-Hannah B wrote: Our office has not sent an order for this patient since 11/2022

## 2023-11-13 ENCOUNTER — Ambulatory Visit: Attending: Cardiovascular Disease | Admitting: *Deleted

## 2023-11-13 ENCOUNTER — Telehealth (HOSPITAL_BASED_OUTPATIENT_CLINIC_OR_DEPARTMENT_OTHER): Payer: Self-pay

## 2023-11-13 DIAGNOSIS — I4892 Unspecified atrial flutter: Secondary | ICD-10-CM | POA: Insufficient documentation

## 2023-11-13 DIAGNOSIS — Z5181 Encounter for therapeutic drug level monitoring: Secondary | ICD-10-CM | POA: Insufficient documentation

## 2023-11-13 LAB — POCT INR: INR: 2.7 (ref 2.0–3.0)

## 2023-11-13 NOTE — Telephone Encounter (Signed)
 Copied from CRM #[id number]. Topic: General - Other >> Nov 10, 2023  3:29 PM Shona S wrote: Reason for CRM: [employer] calling to see if they should pick up the machine since the patient dont qualify for it, please call [phone] ext [phone]

## 2023-11-13 NOTE — Telephone Encounter (Signed)
 Copied from CRM [id number]. Topic: General - Other >> Nov 13, 2023  4:58 PM Rilla B wrote: Reason for CRM: Patient calling asking to speak with Dr Cyndi nurse who she spoke to earlier today regarding oxygen . Please call patient [phone].

## 2023-11-13 NOTE — Patient Instructions (Signed)
 Continue taking 1 tablet daily except for 1/2 a tablet on Mondays, Wednesdays and Fridays. Recheck in 4 wks Call [phone] for questions or concerns.

## 2023-11-13 NOTE — Telephone Encounter (Signed)
 Reached out to Mindy had to leave a message that no D/C orders have been sent for pt. Also told pt no orders have been sent since 10/24 and that was to [facility] pharmacy    Copied from CRM [id number]. Topic: Clinical - Order For Equipment >> Nov 13, 2023  3:50 PM Leila C wrote: Reason for CRM: Patient [phone] states got a call from [employer] last Friday, that Dr. Jude noted that patient no longer need an oxygen  machine and the office has not respond to [employer]. Patient is upset, denies any symptoms right now and need clarification. Patient does not want to lose her oxygen  machine. Patient has been in the hospital twice this month. Patient wants to speak with the nurse, please call back.

## 2023-11-13 NOTE — Progress Notes (Signed)
 INR 2.7; Please see anticoagulation encounter

## 2023-11-13 NOTE — Telephone Encounter (Signed)
 Left message for Mindy at [facility] that we had not DC pts oxygen 

## 2023-11-15 ENCOUNTER — Encounter (HOSPITAL_BASED_OUTPATIENT_CLINIC_OR_DEPARTMENT_OTHER): Payer: Self-pay | Admitting: Pulmonary Disease

## 2023-11-15 NOTE — Telephone Encounter (Signed)
 Just spoke to Mindy at [facility] and she will refax CMN for me to send with qualifying walk from ER

## 2023-11-15 NOTE — Telephone Encounter (Signed)
 Called mindy from [employer],states she got it figured out.NFN

## 2023-11-20 ENCOUNTER — Ambulatory Visit: Attending: Cardiovascular Disease | Admitting: Cardiovascular Disease

## 2023-11-20 ENCOUNTER — Ambulatory Visit (HOSPITAL_BASED_OUTPATIENT_CLINIC_OR_DEPARTMENT_OTHER): Admitting: Pulmonary Disease

## 2023-11-20 ENCOUNTER — Encounter (HOSPITAL_BASED_OUTPATIENT_CLINIC_OR_DEPARTMENT_OTHER): Payer: Self-pay | Admitting: Pulmonary Disease

## 2023-11-20 VITALS — BP 100/60 | HR 82 | Ht 62.0 in | Wt 197.0 lb

## 2023-11-20 VITALS — BP 134/72 | HR 84 | Ht 62.0 in | Wt 197.0 lb

## 2023-11-20 DIAGNOSIS — J9611 Chronic respiratory failure with hypoxia: Secondary | ICD-10-CM | POA: Diagnosis not present

## 2023-11-20 DIAGNOSIS — I2510 Atherosclerotic heart disease of native coronary artery without angina pectoris: Secondary | ICD-10-CM | POA: Insufficient documentation

## 2023-11-20 DIAGNOSIS — E782 Mixed hyperlipidemia: Secondary | ICD-10-CM | POA: Insufficient documentation

## 2023-11-20 DIAGNOSIS — I359 Nonrheumatic aortic valve disorder, unspecified: Secondary | ICD-10-CM | POA: Diagnosis present

## 2023-11-20 DIAGNOSIS — Z23 Encounter for immunization: Secondary | ICD-10-CM

## 2023-11-20 DIAGNOSIS — I10 Essential (primary) hypertension: Secondary | ICD-10-CM | POA: Insufficient documentation

## 2023-11-20 DIAGNOSIS — I5032 Chronic diastolic (congestive) heart failure: Secondary | ICD-10-CM | POA: Insufficient documentation

## 2023-11-20 DIAGNOSIS — I712 Thoracic aortic aneurysm, without rupture, unspecified: Secondary | ICD-10-CM | POA: Insufficient documentation

## 2023-11-20 DIAGNOSIS — J449 Chronic obstructive pulmonary disease, unspecified: Secondary | ICD-10-CM | POA: Diagnosis not present

## 2023-11-20 MED ORDER — BREZTRI AEROSPHERE 160-9-4.8 MCG/ACT IN AERO: 2.0000 | INHALATION_SPRAY | Freq: Two times a day (BID) | RESPIRATORY_TRACT | 5 refills | Status: DC

## 2023-11-20 MED ORDER — LEVALBUTEROL HCL 0.63 MG/3ML IN NEBU: 0.6300 mg | INHALATION_SOLUTION | RESPIRATORY_TRACT | 5 refills | Status: DC | PRN

## 2023-11-20 MED ORDER — MONTELUKAST SODIUM 10 MG PO TABS: 10.0000 mg | ORAL_TABLET | Freq: Every day | ORAL | 5 refills | Status: DC

## 2023-11-20 NOTE — Progress Notes (Signed)
 Subjective:    Patient ID: Lindsay Bonilla, female    DOB: Oct 21, 1960, 63 y.o.   MRN: [id number]     63 yo ex-smoker for FU of COPD and chronic hypoxic respiratory failure on 2 L of oxygen  -quit smoking 2017, 1-2 PPD, >50 PYrs -last flare /ED visit 10/2020 -f/h/o COPD including a nephew @ 6 years old    PMH - s/p Aortic Valve Replacement/s/p Bentall procedure - Dr. Lucas on 05/2015 St. Jude Mechanical valve , original AVR in 1998 - sinus node dysfunction on Cardizem  - HFpEF- on Lasix   -01/2020 covid infection -chronic back pain on percocet   Meds : 07/2022 -due to persistent high symptom burden, she was switched to nebulizer combination of budesonide /Brovana  and Yupelri .   Roflumilast  >>severe nausea and vomiting and headache    Discussed the use of AI scribe software for clinical note transcription with the patient, who gave verbal consent to proceed.  History of Present Illness Lindsay Bonilla is a 63 year old female with COPD and chronic respiratory failure who presents for follow-up after recent hospitalizations for pneumonia and COPD exacerbation.  She has not returned to her baseline respiratory function and experiences increased dyspnea, becoming breathless more quickly than before. She uses a nebulizer with levalbuterol  and Breztri  twice daily. She has discontinued marijuana use due to concerns about medication interactions.  She experienced a significant increase in heart rate, reaching 256 beats per minute, which she attributes to albuterol  use and prefers to avoid in the future.  Her hospital records showed desaturation to 86%, and she was prescribed oxygen  therapy. She reports difficulty with mobility, finding it challenging to move from her living room to the bathroom on some days. She has been using home oxygen  but encountered issues with her oxygen  supply being potentially discontinued due to administrative errors.  Her current medications include Breztri  twice  daily, levalbuterol  via nebulizer, and Singulair . She has completed her course of antibiotics and needs refills on her medications.  Hosp x 2 , @ [facility] then @ [facility] >> reviewed records, dc'd on HOT  CXR 10/07/23 BB infx ? Atx vs infection  Significant tests/ events reviewed   PFTs 04/2015 >>ratio 33, FEV1 0.69 /25% , improved to 0.95/35% with BD, FVC 61%, TLC 126 %, DLCO 80   06/2021 Alpha 1 137, MM LDCT chest 10/2021>> RLL 4mm nodule, stable   CT chest 07/2018 Coronary artery calcifications. Status post aortic valve and root graft repair, with probable reconstruction of the pulmonary outflow tract . Severe emphysema. Mild, diffuse bilateral bronchial wall thickening.   Echo 04/2019 nml LV fn, RVSP 37     HST 09/2022 >> mild OSA AHI 7/hour, low sat 84%   03/2017 NPSG >> no OSA, min desatn 77%    Review of Systems  neg for any significant sore throat, dysphagia, itching, sneezing, nasal congestion or excess/ purulent secretions, fever, chills, sweats, unintended wt loss, pleuritic or exertional cp, hempoptysis, orthopnea pnd or change in chronic leg swelling. Also denies presyncope, palpitations, heartburn, abdominal pain, nausea, vomiting, diarrhea or change in bowel or urinary habits, dysuria,hematuria, rash, arthralgias, visual complaints, headache, numbness weakness or ataxia.      Objective:   Physical Exam  Gen. Pleasant, obese, in no distress, on [place] [facility] - no lesions, no post nasal drip Neck: No JVD, no thyromegaly, no carotid bruits Lungs: no use of accessory muscles, no dullness to percussion, decreased without rales or rhonchi  Cardiovascular: Rhythm regular, heart sounds  normal, no murmurs or gallops, no peripheral edema Musculoskeletal: No deformities, no cyanosis or clubbing , no tremors       Assessment & Plan:   Assessment and Plan Assessment & Plan COPD with chronic respiratory failure and hypoxia Chronic respiratory failure with hypoxia secondary to COPD.  Recent hospitalization in August for pneumonia and COPD exacerbation. Persistent symptoms with increased dyspnea and reduced exercise tolerance. Current treatment includes Breztri  and levalbuterol . - Refill Breztri  and levalbuterol . - Encourage continued use of Ohtuwayre for a longer period. - Initiate approval process for Dupixent if O2 wear is not effective. - Administer flu shot. -If interested in zephyr valve, will have to reassess PFTs  chronic respiratory failure and hypoxia Oxygen  therapy management and qualification Oxygen  therapy qualification based on hospital records showing desaturation to 86%. Discussion about Medicare requirements for oxygen  therapy qualification, emphasizing the need for stable state measurements. - Send paperwork to DME [facility] for oxygen  therapy qualification. - Use hospital records for oxygen  qualification. - I offered her repeat walk to document sat drop but she refused. Perceived conflict here but I reassured her thaT I would fill out CMN paperwork   Adverse effects of respiratory and steroid medications Adverse effects from steroid use include weight gain, swelling, and bruising. Tachycardia potentially linked to albuterol  use, leading to discontinuation of albuterol  and switch to levalbuterol . - Discontinue albuterol  and replace with levalbuterol . - Monitor for adverse effects of steroid use.

## 2023-11-20 NOTE — Telephone Encounter (Signed)
 CMN faxed and notes confirmation received

## 2023-11-20 NOTE — Progress Notes (Signed)
 Chief Complaint  Patient presents with   Follow-up    CAD, chronic diastolic CHF   History of Present Illness: 63 yo female with history of CAD, bicuspid aortic valve s/p Ross procedure in 1998 with thoracic aortic aneurysm and then Bentall procedure in April 2017, severe COPD, GERD, anxiety, chronic diastolic CHF,  tobacco abuse, PVCs, PACs here today for cardiac follow up. She has been followed for aortic valve insufficiency and thoracic aortic aneurysm and underwent Bentall procedure per Dr. Lucas on 06/15/15.  Cardiac cath March 2017 with mild CAD (20% RCA stenosis). Cardiac monitor June 2017 with PACs, PVCs. She has chronic diastolic CHF and is on Lasix . Normal ABI December 2018. Venous dopplers August 2019 with no evidence of DVT. Volume overload when seen in our office in October 2018. Metolazone  added 3 times per week to her Lasix  and her volume overload improved. Echo December 2019 with LVEF=60-65%, mild MR. She did not tolerate Cardizem  due to sinus node dysfunction and was seen in EP clinic. Her cardizem  was stopped. Echo March 2021 with LVEF=55-60%, trivial MR, well functioning AVR. She was seen in March 2021 in the EP clinic by Dr. Waddell and he reassured her that her heart rate was truly not in the 40s as she was having PVCs. She was last seen in EP clinic 05/14/20 and no changes were made. She is followed in the pulmonary office for severe COPD and is now on supplemental O2 at night and sometimes during the day. Echo July 2024 with LVEF=65-70%. Mild mitral regurgitation. Normally functioning mechanical AVR. She was admitted to [facility] 10/10/23 with a COPD exacerbation. She was admitted to [facility] 10/25/23 with a COPD exacerbation. She reports having atrial fib while admitted. Echo 8/27\25 at [facility] with normal LV function and normally functioning mechanical AVR.  She is here today for follow up. The patient denies any chest pain, palpitations, lower extremity edema, orthopnea,  PND, dizziness, near syncope or syncope. No change in baseline dyspnea.     Primary Care Physician: [facility]  Past Medical History:  Diagnosis Date   Anxiety    Aortic aneurysm (HCC)    Aortic insufficiency    a. s/p Bentall with mechanical AVR 4/17 >> FU Echo 6/17: Mild LVH, EF 60-65%, normal wall motion, ventricular septum with paradoxical septal motion, St. Jude mechanical AVR functioning  normally with no regurgitation (mean 6 mmHg), MAC, trivial MR, mild RVE, trivial TR   Aortic stenosis, severe    Bicuspid valve   Arthritis    Asthma    Atrial flutter (HCC)    Bitten or stung by nonvenomous insect and other nonvenomous arthropods, initial encounter    Bronchitis    Chronic diastolic CHF (congestive heart failure) (HCC)    Chronic respiratory failure with hypoxia (HCC)    COPD (chronic obstructive pulmonary disease) (HCC)    DDD (degenerative disc disease), cervical    Depression    Endocarditis, valve unspecified    GERD (gastroesophageal reflux disease)    Hard of hearing    Heart failure (HCC)    Heart failure, unspecified (HCC)    Hemorrhage, not elsewhere classified    History of pneumonia    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   Hyperkalemia    Low back pain    Migraine headache    Mild CAD    Other chronic pain    Palpitations    PAT (paroxysmal atrial tachycardia) (HCC)  PONV (postoperative nausea and vomiting)    only once   Premature atrial contractions    PVC's (premature ventricular contractions)    Shortness of breath    Nodule left lung CT done 8/6   Sinus bradycardia    Stress incontinence    Unspecified osteoarthritis, unspecified site    Valvular heart disease     Past Surgical History:  Procedure Laterality Date   ABDOMINAL HYSTERECTOMY     ANTERIOR CERVICAL DECOMP/DISCECTOMY FUSION  10/05/2011   Procedure: ANTERIOR CERVICAL DECOMPRESSION/DISCECTOMY FUSION 2 LEVELS;  Surgeon: Catalina CHRISTELLA Stains,  MD;  Location: [facility];  Service: Neurosurgery;  Laterality: N/A;  Cervical five - six , six - seven Anterior cervical decompression/diskectomy/fusion/plate   ANTERIOR CRUCIATE LIGAMENT REPAIR Right    AORTIC VALVE REPLACEMENT     ARTHROSCOPIC REPAIR ACL  rt   BENTALL PROCEDURE N/A 06/15/2015   Procedure: BENTALL PROCEDURE; HEMI-ARCH REPAIR WITH #23 ST JUDE MECHANICAL AVR CONDUIT AND #28 HEMASHIELD PLATINUM GRAFT;  Surgeon: Dorise MARLA Fellers, MD;  Location: [facility] OR;  Service: Open Heart Surgery;  Laterality: N/A;   CARDIAC CATHETERIZATION N/A 05/21/2015   Procedure: Right/Left Heart Cath and Coronary Angiography;  Surgeon: Lonni JONETTA Cash, MD;  Location: [facility] INVASIVE CV LAB;  Service: Cardiovascular;  Laterality: N/A;   CHOLECYSTECTOMY     COLONOSCOPY  10/13/05   COLONOSCOPY WITH PROPOFOL  N/A 07/22/2021   Procedure: COLONOSCOPY WITH PROPOFOL ;  Surgeon: Federico Rosario BROCKS, MD;  Location: [facility];  Service: Gastroenterology;  Laterality: N/A;   DIAGNOSTIC LAPAROSCOPY     HEMOSTASIS CLIP PLACEMENT  07/22/2021   Procedure: HEMOSTASIS CLIP PLACEMENT;  Surgeon: Federico Rosario BROCKS, MD;  Location: [facility];  Service: Gastroenterology;;   JOINT REPLACEMENT Right 2016   KNEE ARTHROSCOPY     rt x4  x1 lft   MYRINGOPLASTY W/ FAT GRAFT     graft from behind ear   POLYPECTOMY  07/22/2021   Procedure: POLYPECTOMY;  Surgeon: Federico Rosario BROCKS, MD;  Location: [facility];  Service: Gastroenterology;;   [place]  10/13/05, 09/19/05   TEE WITHOUT CARDIOVERSION N/A 06/15/2015   Procedure: TRANSESOPHAGEAL ECHOCARDIOGRAM (TEE);  Surgeon: Dorise MARLA Fellers, MD;  Location: [facility] OR;  Service: Open Heart Surgery;  Laterality: N/A;   TONSILLECTOMY  2005   TUBAL LIGATION     TYMPANOSTOMY TUBE PLACEMENT      Current Outpatient Medications  Medication Sig Dispense Refill   albuterol  (PROVENTIL ) (2.5 MG/3ML) 0.083% nebulizer solution Take 3 mLs (2.5 mg total) by nebulization every 4 (four) hours as needed for wheezing  or shortness of breath. 540 mL 5   albuterol  (VENTOLIN  HFA) 108 (90 Base) MCG/ACT inhaler Inhale 2 puffs into the lungs every 4 (four) hours as needed for wheezing or shortness of breath. 18 g 5   amoxicillin -clavulanate (AUGMENTIN ) 875-125 MG tablet Take 1 tablet by mouth 2 (two) times daily as needed (Prior to Dentist App).     arformoterol  (BROVANA ) 15 MCG/2ML NEBU Take 2 mLs (15 mcg total) by nebulization 2 (two) times daily. 120 mL 3   Ascorbic Acid (VITAMIN C ER PO) Take 2,000 mg by mouth daily.     aspirin  EC 81 MG tablet Take 81 mg by mouth daily.     chlorpheniramine (CHLOR-TRIMETON) 4 MG tablet Take 4 mg by mouth See admin instructions. Take 4 mg in the morning and 8 mg at bedtime     Cholecalciferol (VITAMIN D) 125 MCG (5000 UT) CAPS Take 5,000 Units by mouth daily.  Ensifentrine  (OHTUVAYRE ) 3 MG/2.5ML SUSP Inhale 2.5 mLs into the lungs 2 (two) times daily. Nebulizer bid 75 mL 5   EPINEPHrine  (EPIPEN ) 0.3 mg/0.3 mL IJ SOAJ injection Inject 0.3 mg into the muscle as needed for anaphylaxis. For allergic reaction     furosemide  (LASIX ) 40 MG tablet Take 1 tablet (40 mg total) by mouth 2 (two) times daily. (Patient taking differently: Take 40 mg by mouth 4 (four) times a week.) 180 tablet 3   gabapentin  (NEURONTIN ) 600 MG tablet Take 600 mg by mouth 2 (two) times daily.     guaiFENesin  (MUCINEX ) 600 MG 12 hr tablet Take 1 tablet (600 mg total) by mouth 2 (two) times daily. 60 tablet 2   ipratropium-albuterol  (DUONEB) 0.5-2.5 (3) MG/3ML SOLN Take 3 mLs by nebulization 3 (three) times daily. 360 mL 5   linaclotide  (LINZESS ) 145 MCG CAPS capsule Take 145 mcg by mouth 2 (two) times a week. Weekends     losartan  (COZAAR ) 25 MG tablet take 1 tablet once daily. 90 tablet 3   magnesium  oxide (MAG-OX) 400 MG tablet Take 1 tablet (400 mg total) by mouth daily. 90 tablet 3   metolazone  (ZAROXOLYN ) 2.5 MG tablet take 1 tablet by mouth on monday, wednesday and friday. (Patient taking differently: Take  2.5 mg by mouth 3 (three) times a week. TAKE 1 TABLET BY MOUTH ON MONDAY, WEDNESDAY AND FRIDAY.) 36 tablet 3   montelukast  (SINGULAIR ) 10 MG tablet Take 1 tablet (10 mg total) by mouth at bedtime. 30 tablet 5   naloxone  (NARCAN ) nasal spray 4 mg/0.1 mL Place 1 spray into the nose once.     omeprazole  (PRILOSEC) 40 MG capsule Take 1 capsule (40 mg total) by mouth daily. 30 capsule 5   Oxycodone  HCl 10 MG TABS Take 1 tablet by mouth 4 (four) times daily as needed (pain).     potassium chloride  (KLOR-CON  M) 10 MEQ tablet Take 4 tablets (40 mEq total) by mouth 2 (two) times daily. (Patient taking differently: Take 40 mEq by mouth daily.) 620 tablet 3   promethazine  (PHENERGAN ) 25 MG tablet Take 1 tablet (25 mg total) by mouth every 8 (eight) hours as needed for nausea or vomiting. 10 tablet 0   rosuvastatin  (CRESTOR ) 20 MG tablet Take 1 tablet (20 mg total) by mouth daily. 90 tablet 3   traZODone  (DESYREL ) 100 MG tablet Take 150 mg by mouth at bedtime.     warfarin (COUMADIN ) 5 MG tablet take one-half (1/2) tablet to 1 tablet by mouth daily or as directed by the anticoagulation clinic. (Patient taking differently: Take 2.5-5 mg by mouth one time only at 4 PM. Take ONE-HALF (1/2) tablet M,W,F to 1 tablet the rest of the week by mouth daily or as directed by the Anticoagulation Clinic.) 75 tablet 0   No current facility-administered medications for this visit.    Allergies  Allergen Reactions   Alpha-Gal Other (See Comments)    Red meat   Beta Adrenergic Blockers Anaphylaxis   Doxycycline  Swelling and Other (See Comments)    Dizziness, sleepy, talking out of my head, almost caused liver failure   Vancomycin  Swelling and Rash    Severe swelling   Avelox [Moxifloxacin Hcl In Nacl] Rash   Ciprofloxacin Nausea And Vomiting   Cleocin [Clindamycin Hcl] Rash   Moxifloxacin Rash   Sulfamethoxazole-Trimethoprim Rash   Tape Other (See Comments)    White clear itches, rash ONLY use paper tape     Social History   Socioeconomic  History   Marital status: Divorced    Spouse name: Not on file   Number of children: 3   Years of education: Not on file   Highest education level: Not on file  Occupational History   Occupation: Disabled  Tobacco Use   Smoking status: Former    Current packs/day: 0.00    Average packs/day: 0.1 packs/day for 30.0 years (3.0 ttl pk-yrs)    Types: Cigarettes    Start date: 04/28/1985    Quit date: 04/29/2015    Years since quitting: 8.5   Smokeless tobacco: Never  Vaping Use   Vaping status: Never Used  Substance and Sexual Activity   Alcohol use: No    Alcohol/week: 0.0 standard drinks of alcohol   Drug use: No   Sexual activity: Not Currently  Other Topics Concern   Not on file  Social History Narrative   Not on file   Social Drivers of Health   Financial Resource Strain: Low Risk  (11/07/2022)   Received from [facility]   Overall Financial Resource Strain (CARDIA)    Difficulty of Paying Living Expenses: Not hard at all  Food Insecurity: No Food Insecurity (10/07/2023)   Hunger Vital Sign    Worried About Running Out of Food in the Last Year: Never true    Ran Out of Food in the Last Year: Never true  Transportation Needs: No Transportation Needs (10/07/2023)   PRAPARE - Administrator, Civil Service (Medical): No    Lack of Transportation (Non-Medical): No  Physical Activity: Inactive (11/07/2022)   Received from [facility]   Exercise Vital Sign    On average, how many days per week do you engage in moderate to strenuous exercise (like a brisk walk)?: 0 days    On average, how many minutes do you engage in exercise at this level?: 0 min  Stress: No Stress Concern Present (11/07/2022)   Received from [facility] of Occupational Health - Occupational Stress Questionnaire    Feeling of Stress : Not at all  Social Connections: Socially Integrated (11/07/2022)   Received from [facility]    Social Connection and Isolation Panel    In a typical week, how many times do you talk on the phone with family, friends, or neighbors?: Three times a week    How often do you get together with friends or relatives?: Three times a week    How often do you attend church or religious services?: 1 to 4 times per year    Do you belong to any clubs or organizations such as church groups, unions, fraternal or athletic groups, or school groups?: No    How often do you attend meetings of the clubs or organizations you belong to?: 1 to 4 times per year    Are you married, widowed, divorced, separated, never married, or living with a partner?: Married  Intimate Partner Violence: Not At Risk (10/07/2023)   Humiliation, Afraid, Rape, and Kick questionnaire    Fear of Current or Ex-Partner: No    Emotionally Abused: No    Physically Abused: No    Sexually Abused: No    Family History  Problem Relation Age of Onset   Cancer Mother        Type unknown-? lung   Heart attack Father    Asthma Brother    Colon cancer Brother        dx at age 43  Esophageal cancer Maternal Grandfather        smoker   Diabetes Other        family history   Hyperlipidemia Other        family history    Review of Systems:  As stated in the HPI and otherwise negative.   BP 100/60 (BP Location: Left Arm, Patient Position: Sitting, Cuff Size: Normal)   Pulse 82   Ht 5' 2 (1.575 m)   Wt 197 lb (89.4 kg)   SpO2 96%   BMI 36.03 kg/m   Physical Examination:  General: Well developed, well nourished, NAD  HEENT: OP clear, mucus membranes moist  SKIN: warm, dry. No rashes. Neuro: No focal deficits  Musculoskeletal: Muscle strength 5/5 all ext  Psychiatric: Mood and affect normal  Neck: No JVD, no carotid bruits, no thyromegaly, no lymphadenopathy.  Lungs:Clear bilaterally, no wheezes, rhonci, crackles Cardiovascular: Regular rate and rhythm. No murmurs, gallops or rubs. Abdomen:Soft. Bowel sounds present.  Non-tender.  Extremities: No lower extremity edema. Pulses are 2 + in the bilateral DP/PT.   EKG:  EKG is not ordered today. The ekg ordered today demonstrates  Recent Labs: 10/07/2023: ALT 28; B Natriuretic Peptide 70.0 10/09/2023: BUN 20; Creatinine, Ser 0.60; Hemoglobin 12.7; Magnesium  2.1; Platelets 272; Potassium 4.8; Sodium 135   Lipid Panel    Component Value Date/Time   CHOL 169 02/15/2023 1415   TRIG 180 (H) 02/15/2023 1415   HDL 61 02/15/2023 1415   CHOLHDL 2.8 02/15/2023 1415   CHOLHDL 2.7 10/22/2015 0933   VLDL 19 10/22/2015 0933   LDLCALC 78 02/15/2023 1415   LDLDIRECT 79.0 03/25/2014 0947     Wt Readings from Last 3 Encounters:  11/20/23 197 lb (89.4 kg)  10/08/23 195 lb 1.7 oz (88.5 kg)  06/01/23 182 lb 9.6 oz (82.8 kg)    Assessment and Plan:   1. Aortic valve disease: She is s/p mechanical AVR in 2017. Working well by echo July 2024. Continue coumadin . Continue SBE prophylaxis when indicated.   2. Thoracic aortic aneurysm: She is s/p Bentall procedure 2017.  3. Tobacco abuse, in remission: She has stopped smoking.   4. Palpitations/PVCs/PACs/atrial fibrillation:  She did not tolerate Cardizem  due to sinus node dysfunction. Cardiac monitor in June 2021 with sinus with no bradycardia but with known PVCs, PACs and several short runs of SVT and VT. She has not required AV nodal blocking agents. Short run of atrial fib during COPD exacerbation in August 2025. NO recurrence.      5. HLD: LDL near goal in December 7975. Continue statin  6. Chronic diastolic CHF: Weight is up after recent steroid use. No volume overload on exam. Continue Lasix  alternating and metolazone  three times weekly.   7. CAD: Mild RCA plaque by cath 2017. No chest pain. Continue ASA and statin  8. HTN: BP is controlled. Continue current therapy  Labs/ tests ordered today include:  No orders of the defined types were placed in this encounter.  Disposition:   F/U with me in 12  months  Signed, Lonni Cash, MD 11/20/2023 10:05 AM    [facility] [address] [place], [place], [place]  [address] Phone: [phone]; Fax: [phone]

## 2023-11-20 NOTE — Patient Instructions (Addendum)
 X refills on breztri  & levalbuterol  & singulair   X flu shot  X Try to get back on Ohtuwayre X start approval process for dupizxent

## 2023-11-20 NOTE — Patient Instructions (Signed)

## 2023-11-20 NOTE — Addendum Note (Signed)
 Addended by: TRUDY WARREN CROME on: 11/20/2023 04:11 PM   Modules accepted: Orders

## 2023-11-22 ENCOUNTER — Telehealth: Payer: Self-pay

## 2023-11-22 DIAGNOSIS — J449 Chronic obstructive pulmonary disease, unspecified: Secondary | ICD-10-CM

## 2023-11-22 NOTE — Telephone Encounter (Signed)
 Submitted a Prior Authorization request to [employer] for DUPIXENT via CoverMyMeds. Will update once we receive a response.  Key: [id number]

## 2023-11-24 NOTE — Telephone Encounter (Signed)
 Created in error

## 2023-11-24 NOTE — Telephone Encounter (Signed)
 Received a fax regarding Prior Authorization from [employer] for DUPIXENT. Authorization has been DENIED because COPD is not one of their covered indications.  Phone# [phone]   Reached out to [facility] and advised him of the denial. He said he will research to see if the plan recognizes the indication yet. Faxed enrollment form to [employer] with insurance card, denial letter, and medlist. Will await response.  Phone #: [phone] Fax#: [phone]

## 2023-11-27 ENCOUNTER — Encounter (HOSPITAL_BASED_OUTPATIENT_CLINIC_OR_DEPARTMENT_OTHER): Payer: Self-pay | Admitting: Pulmonary Disease

## 2023-11-28 ENCOUNTER — Encounter (HOSPITAL_BASED_OUTPATIENT_CLINIC_OR_DEPARTMENT_OTHER): Payer: Self-pay | Admitting: Pulmonary Disease

## 2023-11-28 ENCOUNTER — Other Ambulatory Visit (HOSPITAL_COMMUNITY): Payer: Self-pay

## 2023-11-28 NOTE — Telephone Encounter (Signed)
 PMH asthma-COPD overlap. Submitted a Prior Authorization request to [employer] for DUPIXENT via CoverMyMeds. Will update once we receive a response.  Key: [id number]  Aleck Puls, PharmD, BCPS Clinical Pharmacist  [facility]

## 2023-11-28 NOTE — Telephone Encounter (Signed)
 FYI- please note pt is also on Coumadin  and ASA- she tolerated Flu shot the day of with minimal bleeding at site of injection

## 2023-11-29 ENCOUNTER — Encounter: Payer: Self-pay | Admitting: Cardiovascular Disease

## 2023-11-29 ENCOUNTER — Other Ambulatory Visit (HOSPITAL_COMMUNITY): Payer: Self-pay

## 2023-11-29 NOTE — Telephone Encounter (Signed)
 Received notification from [employer] regarding a prior authorization for DUPIXENT. Authorization has been APPROVED from 11/28/23 to 05/26/24. Approval letter sent to scan center.  Per test claim, copay for 28 days supply is $4  Patient can fill through [facility]: [phone]   Authorization # [id number] Phone # [phone]

## 2023-11-29 NOTE — Telephone Encounter (Signed)
 ATC patient for new start Dupixent - LVMTCB

## 2023-11-30 ENCOUNTER — Encounter (HOSPITAL_BASED_OUTPATIENT_CLINIC_OR_DEPARTMENT_OTHER): Payer: Self-pay | Admitting: Pulmonary Disease

## 2023-12-01 ENCOUNTER — Other Ambulatory Visit (HOSPITAL_BASED_OUTPATIENT_CLINIC_OR_DEPARTMENT_OTHER): Payer: Self-pay

## 2023-12-01 MED ORDER — LEVALBUTEROL HCL 0.63 MG/3ML IN NEBU: 0.6300 mg | INHALATION_SOLUTION | RESPIRATORY_TRACT | 5 refills | Status: DC | PRN

## 2023-12-05 ENCOUNTER — Other Ambulatory Visit: Payer: Self-pay | Admitting: Cardiovascular Disease

## 2023-12-05 NOTE — Telephone Encounter (Signed)
 Spoke to patient - scheduled for new start Dupixent in office 12/14/23. Provided office address.   She is aware to look out for call from [place] for onboarding.   Aleck Puls, PharmD, BCPS, CPP Clinical Pharmacist  [facility]

## 2023-12-06 ENCOUNTER — Other Ambulatory Visit: Payer: Self-pay

## 2023-12-06 ENCOUNTER — Other Ambulatory Visit (HOSPITAL_COMMUNITY): Payer: Self-pay

## 2023-12-06 MED ORDER — DUPIXENT 300 MG/2ML ~~LOC~~ SOAJ
SUBCUTANEOUS | 0 refills | Status: AC
Start: 2023-12-06 — End: ?
  Filled 2023-12-06: qty 4, 28d supply, fill #0

## 2023-12-06 NOTE — Progress Notes (Signed)
 Specialty Pharmacy Initial Fill Coordination Note  Lindsay Bonilla is a 63 y.o. female contacted today regarding initial fill of specialty medication(s) Dupilumab (Dupixent)   Patient requested Courier to Provider Office   Delivery date: 12/08/23   Verified address: [address], [place], [place] [address]   Medication will be filled on 10/9.   Patient is aware of $4 copayment.

## 2023-12-06 NOTE — Telephone Encounter (Signed)
 Rx for Dupixent sent to [facility]. No loading dose.

## 2023-12-07 ENCOUNTER — Other Ambulatory Visit: Payer: Self-pay

## 2023-12-09 ENCOUNTER — Other Ambulatory Visit: Payer: Self-pay | Admitting: Cardiovascular Disease

## 2023-12-09 DIAGNOSIS — I4892 Unspecified atrial flutter: Secondary | ICD-10-CM

## 2023-12-11 ENCOUNTER — Encounter

## 2023-12-11 ENCOUNTER — Encounter (HOSPITAL_BASED_OUTPATIENT_CLINIC_OR_DEPARTMENT_OTHER): Payer: Self-pay | Admitting: Pulmonary Disease

## 2023-12-11 ENCOUNTER — Encounter: Payer: Self-pay | Admitting: *Deleted

## 2023-12-11 DIAGNOSIS — J9611 Chronic respiratory failure with hypoxia: Secondary | ICD-10-CM

## 2023-12-11 DIAGNOSIS — J449 Chronic obstructive pulmonary disease, unspecified: Secondary | ICD-10-CM

## 2023-12-11 NOTE — Telephone Encounter (Signed)
 Refill request for warfarin:  Last INR was 2.7 on 11/13/23 Next INR due 12/11/23 LOV was 11/20/23  Refill approved.

## 2023-12-12 NOTE — Telephone Encounter (Signed)
 Spoke with [employer] they went out yesterday to service her machine and found nothing wrong with it. They state she is complaining about battery life and with a continuous flow POC she is only getting about an 1hr battery life and this is normal for machine. They state if we will send an order over for POC titration study they will see if she can qualify for pulsed O2 and get her a pulsed POC. Please advise on POC titration order

## 2023-12-13 NOTE — Telephone Encounter (Signed)
 Order placed

## 2023-12-14 ENCOUNTER — Ambulatory Visit

## 2023-12-14 DIAGNOSIS — Z7189 Other specified counseling: Secondary | ICD-10-CM

## 2023-12-14 DIAGNOSIS — J449 Chronic obstructive pulmonary disease, unspecified: Secondary | ICD-10-CM | POA: Diagnosis not present

## 2023-12-14 NOTE — Patient Instructions (Signed)
 Your next Dupixent dose is due on 12/28/23, 01/11/24, and every 14 days thereafter  CONTINUE your other respiratory medications. Dupixent does NOT replace these other medications.   Your prescription will be shipped from [facility]: [phone].  Someone will call to schedule shipment and confirm address. They will mail your medication to your home.  You will need to be seen by your provider in 3 to 4 months to assess how Dupixent is working for you. You have a follow-up appointment scheduled in 02/19/24.   Stay up to date on all routine vaccines: influenza, pneumonia, COVID19, Shingles  How to manage an injection site reaction: Remember the 5 C's: COUNTER - leave on the counter at least 30 minutes but up to overnight to bring medication to room temperature. This may help prevent stinging COLD - place something cold (like an ice gel pack or cold water bottle) on the injection site just before cleansing with alcohol. This may help reduce pain CLARITIN  - use Claritin  (generic name is loratadine ) for the first two weeks of treatment or the day of, the day before, and the day after injecting. This will help to minimize injection site reactions CORTISONE CREAM - apply if injection site is irritated and itching CALL ME - if injection site reaction is bigger than the size of your fist, looks infected, blisters, or if you develop hives

## 2023-12-14 NOTE — Progress Notes (Signed)
 HPI Patient presents today to [facility] to see pharmacy team for Dupixent new start. She has COPD. Recent hospitalization in August 2025 for PNA and COPD exacerbation. Persistent symptoms include increased dyspnea and reduced exercise tolerance. See last visit note with Dr. Jude on 11/20/23 for further details. At that time, plan to initiate process for Dupixent.  Respiratory Medications Current regimen: Breztri  160-9-4.8 mcg/act (Inhale 2 puffs twice daily), levalbuterol  0.63mg /56mL neb soln (Take 3 mLs (0.63 mg total) by nebulization every 4 (four) hours as needed for wheezing or shortness of breath), Ohtuvayre  3mg /2.84mL ( Inhale 2.5 mLs into the lungs 2 (two) times daily), Singulair  10mg  tab (Take 1 tablet (10 mg total) by mouth at bedtime)  Patient reports no known adherence challenges  OBJECTIVE Allergies  Allergen Reactions   Alpha-Gal Other (See Comments)    Red meat   Beta Adrenergic Blockers Anaphylaxis   Doxycycline  Swelling and Other (See Comments)    Dizziness, sleepy, talking out of my head, almost caused liver failure   Vancomycin  Swelling and Rash    Severe swelling   Avelox [Moxifloxacin Hcl In Nacl] Rash   Ciprofloxacin Nausea And Vomiting   Cleocin [Clindamycin Hcl] Rash   Moxifloxacin Rash   Sulfamethoxazole-Trimethoprim Rash   Tape Other (See Comments)    White clear itches, rash ONLY use paper tape    Outpatient Encounter Medications as of 12/14/2023  Medication Sig   albuterol  (VENTOLIN  HFA) 108 (90 Base) MCG/ACT inhaler Inhale 2 puffs into the lungs every 4 (four) hours as needed for wheezing or shortness of breath.   Ascorbic Acid (VITAMIN C ER PO) Take 2,000 mg by mouth daily.   aspirin  EC 81 MG tablet Take 81 mg by mouth daily.   budesonide -glycopyrrolate -formoterol  (BREZTRI  AEROSPHERE) 160-9-4.8 MCG/ACT AERO inhaler Inhale 2 puffs into the lungs in the morning and at bedtime.   Cholecalciferol (VITAMIN D) 125 MCG (5000 UT) CAPS Take 5,000 Units  by mouth daily.   Dupilumab (DUPIXENT) 300 MG/2ML SOAJ Inject 300mg  in the skin on day 0 in clinic and every 14 days thereafter.   Ensifentrine  (OHTUVAYRE ) 3 MG/2.5ML SUSP Inhale 2.5 mLs into the lungs 2 (two) times daily. Nebulizer bid (Patient not taking: Reported on 11/20/2023)   EPINEPHrine  (EPIPEN ) 0.3 mg/0.3 mL IJ SOAJ injection Inject 0.3 mg into the muscle as needed for anaphylaxis. For allergic reaction   furosemide  (LASIX ) 40 MG tablet Take 1 tablet (40 mg total) by mouth 2 (two) times daily.   gabapentin  (NEURONTIN ) 600 MG tablet Take 600 mg by mouth 2 (two) times daily.   guaiFENesin  (MUCINEX ) 600 MG 12 hr tablet Take 1 tablet (600 mg total) by mouth 2 (two) times daily.   levalbuterol  (XOPENEX ) 0.63 MG/3ML nebulizer solution Take 3 mLs (0.63 mg total) by nebulization every 4 (four) hours as needed for wheezing or shortness of breath.   linaclotide  (LINZESS ) 145 MCG CAPS capsule Take 145 mcg by mouth 2 (two) times a week. Weekends   losartan  (COZAAR ) 25 MG tablet take 1 tablet once daily.   magnesium  oxide (MAG-OX) 400 MG tablet Take 1 tablet (400 mg total) by mouth daily.   metolazone  (ZAROXOLYN ) 2.5 MG tablet take 1 tablet by mouth on monday, wednesday and friday.   montelukast  (SINGULAIR ) 10 MG tablet Take 1 tablet (10 mg total) by mouth at bedtime.   naloxone  (NARCAN ) nasal spray 4 mg/0.1 mL Place 1 spray into the nose once.   omeprazole  (PRILOSEC) 40 MG capsule Take 1 capsule (40 mg total)  by mouth daily.   Oxycodone  HCl 10 MG TABS Take 1 tablet by mouth 4 (four) times daily as needed (pain).   potassium chloride  (KLOR-CON  M) 10 MEQ tablet Take 4 tablets (40 mEq total) by mouth 2 (two) times daily.   promethazine  (PHENERGAN ) 25 MG tablet Take 1 tablet (25 mg total) by mouth every 8 (eight) hours as needed for nausea or vomiting.   rosuvastatin  (CRESTOR ) 20 MG tablet Take 1 tablet (20 mg total) by mouth daily.   traZODone  (DESYREL ) 100 MG tablet Take 150 mg by mouth at bedtime.    warfarin (COUMADIN ) 5 MG tablet take one-half (1/2) tablet to 1 tablet by mouth daily or as directed by the anticoagulation clinic.   No facility-administered encounter medications on file as of 12/14/2023.     Immunization History  Administered Date(s) Administered   Hepatitis B 03/04/2002   Hepatitis B, ADULT 03/04/2002, 12/23/2015, 12/15/2016   Influenza Inj Mdck Quad Pf 11/28/2019, 12/08/2021   Influenza Split 12/15/2003, 01/25/2005   Influenza Whole 12/29/2020   Influenza, Seasonal, Injecte, Preservative Fre 11/20/2023   Influenza,inj,quad, With Preservative 12/23/2015, 12/15/2016   Influenza-Unspecified 11/11/2010, 01/31/2012, 12/12/2012, 11/07/2013, 12/17/2014, 12/14/2015, 12/15/2016, 12/07/2017, 11/26/2018, 11/27/2019   Moderna Sars-Covid-2 Vaccination 05/16/2019, 06/14/2019, 01/01/2020, 06/24/2020   Pneumococcal Conjugate-13 11/26/2018, 11/28/2019   Pneumococcal Polysaccharide-23 01/25/2005, 11/11/2010, 10/06/2011, 12/15/2016, 12/07/2017   Pneumococcal-Unspecified 01/25/2005, 12/15/2016   Respiratory Syncytial Virus Vaccine,Recomb Aduvanted(Arexvy) 12/08/2021   Td 12/23/2015   Tdap 08/08/2010, 12/23/2015, 12/30/2018   Zoster Recombinant(Shingrix) 06/24/2020     PFTs    Latest Ref Rng & Units 05/08/2015    2:15 PM  PFT Results  FVC-Pre L 2.12   FVC-Predicted Pre % 61   FVC-Post L 2.25   FVC-Predicted Post % 65   Pre FEV1/FVC % % 33   Post FEV1/FCV % % 42   FEV1-Pre L 0.69   FEV1-Predicted Pre % 25   FEV1-Post L 0.95   DLCO uncorrected ml/min/mmHg 19.65   DLCO UNC% % 80   DLCO corrected ml/min/mmHg 19.47   DLCO COR %Predicted % 80   DLVA Predicted % 84   TLC L 6.37   TLC % Predicted % 126   RV % Predicted % 218      Eosinophils Most recent blood eosinophil count was <50 cells/microL taken on 10/27/23.   IgE: unable to review   Assessment   Biologics training for dupilumab (Dupixent)  Goals of therapy: Mechanism: human monoclonal IgG4 antibody that  inhibits interleukin-4 and interleukin-13 cytokine-induced responses, including release of proinflammatory cytokines, chemokines, and IgE Reviewed that Dupixent is add-on medication and patient must continue maintenance inhaler regimen. Response to therapy: may take 4 months to determine efficacy. Discussed that patients generally feel improvement sooner than 4 months.  Side effects: injection site reaction (6-18%), antibody development (5-16%), ophthalmic conjunctivitis (2-16%), transient blood eosinophilia (1-2%)  Dose: 300mg  every 14 days (for COPD)  Administration/Storage:  Reviewed administration sites of thigh or abdomen (at least 2-3 inches away from abdomen). Reviewed the upper arm is only appropriate if caregiver is administering injection  Do not shake pen/syringe as this could lead to product foaming or precipitation. Do not use if solution is discolored or contains particulate matter or if window on prefilled pen is yellow (indicates pen has been used).  Reviewed storage of medication in refrigerator. Reviewed that Dupixent can be stored at room temperature in unopened carton for up to 14 days.  Access: Approval of Dupixent through: insurance  Patient self-administered Dupixent 300mg /76ml x  1 (total dose 300mg ) in upper L thigh using [facility]-supplied medication.  Dupixent 300mg /32mL autoinjector pen NDC: 0024-5915-02 Lot: [id number] Expiration: 2026-02-27  Patient monitored for 30 minutes for adverse reaction.  Patient tolerated well.  Injection site noted. Patient denies itchiness and irritation at injection.   PLAN Continue Dupixent 300mg  every 14 days.  Next dose is due 12/28/23 and every 14 days thereafter. Rx sent to: [facility]: [phone] .  Patient provided with pharmacy phone number and advised to call later this week to schedule shipment to home. Patient provided with copay card information to provide to pharmacy if quoted copay exceeds $5 per  month. Continue maintenance inhaler regimen as prescribed.  All questions encouraged and answered.  Instructed patient to reach out with any further questions or concerns.  Thank you for allowing pharmacy to participate in this patient's care.  This appointment required 45 minutes of patient care (this includes precharting, chart review, review of results, face-to-face care, etc.).

## 2023-12-15 ENCOUNTER — Telehealth: Payer: Self-pay

## 2023-12-15 MED ORDER — DUPIXENT 300 MG/2ML ~~LOC~~ SOAJ
300.0000 mg | SUBCUTANEOUS | 3 refills | Status: AC
  Filled 2023-12-18 – 2024-01-04 (×2): qty 4, 28d supply, fill #0
  Filled 2024-01-29: qty 4, 28d supply, fill #1
  Filled 2024-02-20: qty 4, 28d supply, fill #2
  Filled 2024-03-22 – 2024-03-25 (×2): qty 4, 28d supply, fill #3

## 2023-12-15 NOTE — Telephone Encounter (Signed)
 Copied from CRM [id number]. Topic: General - Other >> Dec 14, 2023  5:34 PM Lavanda D wrote: Reason for CRM: Patient would like to leave a message for Aleck, she is missing a piece of her phone. She said it is a little magnet that sticks to back of her phone that helps her hold is better, she said it's silver with mixed colors in the middle part with silver around the edge. She was there today and thinks it may have fallen off.  Routing to [employer]

## 2023-12-18 ENCOUNTER — Other Ambulatory Visit: Payer: Self-pay

## 2023-12-19 ENCOUNTER — Ambulatory Visit: Attending: Cardiovascular Disease | Admitting: *Deleted

## 2023-12-19 DIAGNOSIS — I4892 Unspecified atrial flutter: Secondary | ICD-10-CM | POA: Diagnosis present

## 2023-12-19 DIAGNOSIS — Z5181 Encounter for therapeutic drug level monitoring: Secondary | ICD-10-CM | POA: Diagnosis present

## 2023-12-19 LAB — POCT INR: INR: 2.4 (ref 2.0–3.0)

## 2023-12-19 NOTE — Patient Instructions (Signed)
 Continue taking 1 tablet daily except for 1/2 a tablet on Mondays, Wednesdays and Fridays. Recheck in 6 wks Call [phone] for questions or concerns.

## 2023-12-19 NOTE — Progress Notes (Signed)
 INR 2.4 Please see anticoagulation encounter

## 2023-12-20 NOTE — Telephone Encounter (Signed)
 This was routed to pharmacy as she was seen by the pharmacist. Clinical does not know where pharmacy see's pt's, especially in different pods.   I called and spoke to pt. I informed pt that our office has not seen this and pt verbalized understanding. NFN

## 2023-12-22 ENCOUNTER — Telehealth (HOSPITAL_BASED_OUTPATIENT_CLINIC_OR_DEPARTMENT_OTHER): Payer: Self-pay

## 2023-12-22 NOTE — Telephone Encounter (Signed)
 CMN received for oxygen  sent to [facility] signed by provider and faxed confirmation received

## 2023-12-22 NOTE — Progress Notes (Signed)
 Lindsay Bonilla started Dupixent in clinic on 12/14/23. Tolerated well. Able to self-administer.   PLAN Continue Dupixent 300mg  every 14 days.  Next dose is due 12/28/23 and every 14 days thereafter. Rx sent to: [facility]: [phone] .  Patient provided with pharmacy phone number and advised to call later this week to schedule shipment to home. Patient provided with copay card information to provide to pharmacy if quoted copay exceeds $5 per month. Continue maintenance inhaler regimen as prescribed.   All questions encouraged and answered.  Instructed patient to reach out with any further questions or concerns.  Aleck Puls, PharmD, BCPS, CPP Clinical Pharmacist  [facility]

## 2023-12-29 ENCOUNTER — Other Ambulatory Visit: Payer: Self-pay

## 2024-01-04 ENCOUNTER — Encounter (INDEPENDENT_AMBULATORY_CARE_PROVIDER_SITE_OTHER): Payer: Self-pay

## 2024-01-04 ENCOUNTER — Other Ambulatory Visit: Payer: Self-pay

## 2024-01-04 ENCOUNTER — Other Ambulatory Visit: Payer: Self-pay | Admitting: Pharmacy Technician

## 2024-01-04 NOTE — Progress Notes (Signed)
 Specialty Pharmacy Refill Coordination Note  Lindsay Bonilla is a 63 y.o. female contacted today regarding refills of specialty medication(s) Dupilumab (Dupixent)   Patient requested (Patient-Rptd) Delivery   Delivery date: 01/09/2024 Verified address: (Patient-Rptd) [address] [place], [place]   Medication will be filled on: 01/08/2024

## 2024-01-07 ENCOUNTER — Other Ambulatory Visit (HOSPITAL_BASED_OUTPATIENT_CLINIC_OR_DEPARTMENT_OTHER): Payer: Self-pay | Admitting: Pulmonary Disease

## 2024-01-08 ENCOUNTER — Other Ambulatory Visit: Payer: Self-pay

## 2024-01-09 ENCOUNTER — Other Ambulatory Visit: Payer: Self-pay

## 2024-01-29 ENCOUNTER — Other Ambulatory Visit (HOSPITAL_COMMUNITY): Payer: Self-pay

## 2024-01-29 ENCOUNTER — Other Ambulatory Visit: Payer: Self-pay | Admitting: Pharmacy Technician

## 2024-01-29 ENCOUNTER — Other Ambulatory Visit: Payer: Self-pay

## 2024-01-29 NOTE — Progress Notes (Signed)
 Specialty Pharmacy Refill Coordination Note  Lindsay Bonilla is a 63 y.o. female contacted today regarding refills of specialty medication(s) Dupilumab  (Dupixent )   Patient requested (Patient-Rptd) Delivery   Delivery date: 02/02/2024 Verified address: (Patient-Rptd) [address] [place] [address]   Medication will be filled on: 02/01/2024

## 2024-01-30 ENCOUNTER — Ambulatory Visit: Attending: Cardiovascular Disease | Admitting: *Deleted

## 2024-01-30 DIAGNOSIS — Z5181 Encounter for therapeutic drug level monitoring: Secondary | ICD-10-CM | POA: Insufficient documentation

## 2024-01-30 DIAGNOSIS — I4892 Unspecified atrial flutter: Secondary | ICD-10-CM | POA: Insufficient documentation

## 2024-01-30 LAB — POCT INR: INR: 2.8 (ref 2.0–3.0)

## 2024-01-30 NOTE — Progress Notes (Signed)
 INR-2.8 Please see anticoagulation encounter

## 2024-01-30 NOTE — Patient Instructions (Signed)
 Continue taking 1 tablet daily except for 1/2 a tablet on Mondays, Wednesdays and Fridays. Recheck in 6 wks Call [phone] for questions or concerns.

## 2024-01-31 ENCOUNTER — Other Ambulatory Visit: Payer: Self-pay

## 2024-02-07 ENCOUNTER — Encounter (HOSPITAL_BASED_OUTPATIENT_CLINIC_OR_DEPARTMENT_OTHER): Payer: Self-pay | Admitting: Pulmonary Disease

## 2024-02-12 ENCOUNTER — Other Ambulatory Visit (HOSPITAL_BASED_OUTPATIENT_CLINIC_OR_DEPARTMENT_OTHER): Payer: Self-pay | Admitting: Pulmonary Disease

## 2024-02-19 ENCOUNTER — Encounter (HOSPITAL_BASED_OUTPATIENT_CLINIC_OR_DEPARTMENT_OTHER): Payer: Self-pay

## 2024-02-19 ENCOUNTER — Ambulatory Visit (HOSPITAL_BASED_OUTPATIENT_CLINIC_OR_DEPARTMENT_OTHER)

## 2024-02-19 VITALS — BP 107/73 | HR 80 | Ht 62.0 in | Wt 203.0 lb

## 2024-02-19 DIAGNOSIS — J439 Emphysema, unspecified: Secondary | ICD-10-CM | POA: Diagnosis not present

## 2024-02-19 DIAGNOSIS — G4733 Obstructive sleep apnea (adult) (pediatric): Secondary | ICD-10-CM

## 2024-02-19 DIAGNOSIS — Z87891 Personal history of nicotine dependence: Secondary | ICD-10-CM

## 2024-02-19 DIAGNOSIS — J9611 Chronic respiratory failure with hypoxia: Secondary | ICD-10-CM | POA: Diagnosis not present

## 2024-02-19 DIAGNOSIS — J4489 Other specified chronic obstructive pulmonary disease: Secondary | ICD-10-CM

## 2024-02-19 NOTE — Progress Notes (Signed)
 "  @Patient  ID: Lindsay Bonilla, female    DOB: 1960-07-18, 63 y.o.   MRN: [id number]  Chief Complaint  Patient presents with   Follow-up    Referring provider: [facility], [facility]  HPI: Discussed the use of AI scribe software for clinical note transcription with the patient, who gave verbal consent to proceed.  History of Present Illness Lindsay Bonilla is a 63 year old female with chronic respiratory issues who presents with persistent shortness of breath. She is accompanied by her daughter and granddaughter.  She experiences ongoing shortness of breath, which has been a persistent issue. She was hospitalized twice in August for severe breathing difficulties. Although her breathing has not returned to the severity experienced during those hospitalizations, she still experiences shortness of breath at times.  She is currently using Breztri , taking two puffs twice a day. She has started Dupixent , with her first dose on October 31st, and has received approximately four doses. She notices some improvement in her breathing after Dupixent  administration. She has previously tried Yupelri , which was discontinued due to adverse effects from a concomitant pill, which made her 'sick as a dog'. She continues to take Singulair , which she has been on for many years, and uses Xopenex  as needed.  She does not feel that she has gotten benefit from Ohtuvayre , but does report feeling benefit from using the Dupixent .  She has experienced issues with medication access, particularly with Levalbuterol , which [facility] has been [facility] since [facility] local [facility]store does not carry it. She has [facility] [place] to consolidate her medication pickups.  She has gained weight due to steroid use during her severe respiratory episodes.  Mornings are her best time for activity, and she manages her condition with the help of her daughter, who assists her at home.  Last OV 11/20/2023  Mathias): Lindsay Bonilla is a 63 year old female with COPD and chronic respiratory failure who presents for follow-up after recent hospitalizations for pneumonia and COPD exacerbation.   She has not returned to her baseline respiratory function and experiences increased dyspnea, becoming breathless more quickly than before. She uses a nebulizer with levalbuterol  and Breztri  twice daily. She has discontinued marijuana use due to concerns about medication interactions.   She experienced a significant increase in heart rate, reaching 256 beats per minute, which she attributes to albuterol  use and prefers to avoid in the future.   Her hospital records showed desaturation to 86%, and she was prescribed oxygen  therapy. She reports difficulty with mobility, finding it challenging to move from her living room to the bathroom on some days. She has been using home oxygen  but encountered issues with her oxygen  supply being potentially discontinued due to administrative errors.   Her current medications include Breztri  twice daily, levalbuterol  via nebulizer, and Singulair . She has completed her course of antibiotics and needs refills on her medications.   Hosp x 2 , @ [facility] then [facility] >> reviewed records, dc'd on HOT  CXR 10/07/23 BB infx ? Atx vs infection   Significant tests/ events reviewed   PFTs 04/2015 >>ratio 33, FEV1 0.69 /25% , improved to 0.95/35% with BD, FVC 61%, TLC 126 %, DLCO 80   06/2021 Alpha 1 137, MM LDCT chest 10/2021>> RLL 4mm nodule, stable   CT chest 07/2018 Coronary artery calcifications. Status post aortic valve and root graft repair, with probable reconstruction of the pulmonary outflow tract . Severe emphysema. Mild, diffuse  bilateral bronchial wall thickening.   Echo 04/2019 nml LV fn, RVSP 37     HST 09/2022 >> mild OSA AHI 7/hour, low sat 84%   03/2017 NPSG >> no OSA, min desatn 77%  Allergies[1]  Immunization History  Administered Date(s) Administered   Hepatitis  B 03/04/2002   Hepatitis B, ADULT 03/04/2002, 12/23/2015, 12/15/2016   Influenza Inj Mdck Quad Pf 11/28/2019, 12/08/2021   Influenza Split 12/15/2003, 01/25/2005   Influenza Whole 12/29/2020   Influenza, Seasonal, Injecte, Preservative Fre 11/20/2023   Influenza,inj,quad, With Preservative 12/23/2015, 12/15/2016   Influenza-Unspecified 11/11/2010, 01/31/2012, 12/12/2012, 11/07/2013, 12/17/2014, 12/14/2015, 12/15/2016, 12/07/2017, 11/26/2018, 11/27/2019   Moderna Sars-Covid-2 Vaccination 05/16/2019, 06/14/2019, 01/01/2020, 06/24/2020   Pneumococcal Conjugate-13 11/26/2018, 11/28/2019   Pneumococcal Polysaccharide-23 01/25/2005, 11/11/2010, 10/06/2011, 12/15/2016, 12/07/2017   Pneumococcal-Unspecified 01/25/2005, 12/15/2016   Respiratory Syncytial Virus Vaccine,Recomb Aduvanted(Arexvy) 12/08/2021   Td 12/23/2015   Tdap 08/08/2010, 12/23/2015, 12/30/2018   Zoster Recombinant(Shingrix) 06/24/2020    Past Medical History:  Diagnosis Date   Anxiety    Aortic aneurysm    Aortic insufficiency    a. s/p [place] with mechanical AVR 4/17 >> FU Echo 6/17: Mild LVH, EF 60-65%, normal wall motion, ventricular septum with paradoxical septal motion, [employer] mechanical AVR functioning  normally with no regurgitation (mean 6 mmHg), MAC, trivial MR, mild RVE, trivial TR   Aortic stenosis, severe    Bicuspid valve   Arthritis    Asthma    Atrial flutter (HCC)    Bitten or stung by nonvenomous insect and other nonvenomous arthropods, initial encounter    Bronchitis    Chronic diastolic CHF (congestive heart failure) (HCC)    Chronic respiratory failure with hypoxia (HCC)    COPD (chronic obstructive pulmonary disease) (HCC)    DDD (degenerative disc disease), cervical    Depression    Endocarditis, valve unspecified    GERD (gastroesophageal reflux disease)    Hard of hearing    Heart failure (HCC)    Heart failure, unspecified (HCC)    Hemorrhage, not elsewhere classified    History of  pneumonia    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   Hyperkalemia    Low back pain    Migraine headache    Mild CAD    Other chronic pain    Palpitations    PAT (paroxysmal atrial tachycardia)    PONV (postoperative nausea and vomiting)    only once   Premature atrial contractions    PVC's (premature ventricular contractions)    Shortness of breath    Nodule left lung CT done 8/6   Sinus bradycardia    Stress incontinence    Unspecified osteoarthritis, unspecified site    Valvular heart disease     Tobacco History: Tobacco Use History[2] Counseling given: Not Answered   Outpatient Medications Prior to Visit  Medication Sig Dispense Refill   albuterol  (VENTOLIN  HFA) 108 (90 Base) MCG/ACT inhaler Inhale 2 puffs into the lungs every 4 (four) hours as needed for wheezing or shortness of breath. 18 g 5   Ascorbic Acid (VITAMIN C ER PO) Take 2,000 mg by mouth daily.     aspirin  EC 81 MG tablet Take 81 mg by mouth daily.     Cholecalciferol (VITAMIN D) 125 MCG (5000 UT) CAPS Take 5,000 Units by mouth daily.     Dupilumab  (DUPIXENT ) 300 MG/2ML SOAJ Inject 300 mg into the skin every 14 (fourteen) days. Inject 300mg  in the skin on day 0 in clinic  and every 14 days thereafter. 4 mL 3   Ensifentrine  (OHTUVAYRE ) 3 MG/2.5ML SUSP Inhale 2.5 mLs into the lungs 2 (two) times daily. Nebulizer bid 75 mL 5   EPINEPHrine  (EPIPEN ) 0.3 mg/0.3 mL IJ SOAJ injection Inject 0.3 mg into the muscle as needed for anaphylaxis. For allergic reaction     furosemide  (LASIX ) 40 MG tablet Take 1 tablet (40 mg total) by mouth 2 (two) times daily. 180 tablet 3   gabapentin  (NEURONTIN ) 600 MG tablet Take 600 mg by mouth 2 (two) times daily.     guaiFENesin  (MUCINEX ) 600 MG 12 hr tablet Take 1 tablet (600 mg total) by mouth 2 (two) times daily. 60 tablet 2   linaclotide  (LINZESS ) 145 MCG CAPS capsule Take 145 mcg by mouth 2 (two) times a week. Weekends     losartan  (COZAAR ) 25 MG tablet  take 1 tablet once daily. 90 tablet 3   magnesium  oxide (MAG-OX) 400 MG tablet Take 1 tablet (400 mg total) by mouth daily. 90 tablet 3   metolazone  (ZAROXOLYN ) 2.5 MG tablet take 1 tablet by mouth on monday, wednesday and friday. 36 tablet 3   montelukast  (SINGULAIR ) 10 MG tablet take 1 tablet (10 milligram total) by mouth at bedtime. 30 tablet 11   naloxone  (NARCAN ) nasal spray 4 mg/0.1 mL Place 1 spray into the nose once.     omeprazole  (PRILOSEC) 40 MG capsule Take 1 capsule (40 mg total) by mouth daily. 30 capsule 5   Oxycodone  HCl 10 MG TABS Take 1 tablet by mouth 4 (four) times daily as needed (pain).     potassium chloride  (KLOR-CON  M) 10 MEQ tablet Take 4 tablets (40 mEq total) by mouth 2 (two) times daily. 620 tablet 3   promethazine  (PHENERGAN ) 25 MG tablet Take 1 tablet (25 mg total) by mouth every 8 (eight) hours as needed for nausea or vomiting. 10 tablet 0   rosuvastatin  (CRESTOR ) 20 MG tablet Take 1 tablet (20 mg total) by mouth daily. 90 tablet 3   traZODone  (DESYREL ) 100 MG tablet Take 150 mg by mouth at bedtime.     warfarin (COUMADIN ) 5 MG tablet take one-half (1/2) tablet to 1 tablet by mouth daily or as directed by the anticoagulation clinic. 75 tablet 3   budesonide -glycopyrrolate -formoterol  (BREZTRI  AEROSPHERE) 160-9-4.8 MCG/ACT AERO inhaler Inhale 2 puffs into the lungs in the morning and at bedtime. 10.7 g 5   levalbuterol  (XOPENEX ) 0.63 MG/3ML nebulizer solution Take 3 mLs (0.63 mg total) by nebulization every 4 (four) hours as needed for wheezing or shortness of breath. 360 mL 5   No facility-administered medications prior to visit.     Review of Systems: as per hpi  Constitutional:   No  weight loss, night sweats,  Fevers, chills, fatigue, or  lassitude.  HEENT:   No headaches,  Difficulty swallowing,  Tooth/dental problems, or  Sore throat,                No sneezing, itching, ear ache, nasal congestion, post nasal drip,   CV:  No chest pain,  Orthopnea, PND,  swelling in lower extremities, anasarca, dizziness, palpitations, syncope.   GI  No heartburn, indigestion, abdominal pain, nausea, vomiting, diarrhea, change in bowel habits, loss of appetite, bloody stools.   Resp: No shortness of breath with exertion or at rest.  No excess mucus, no productive cough,  No non-productive cough,  No coughing up of blood.  No change in color of mucus.  No wheezing.  No  chest wall deformity  Skin: no rash or lesions.  GU: no dysuria, change in color of urine, no urgency or frequency.  No flank pain, no hematuria   MS:  No joint pain or swelling.  No decreased range of motion.  No back pain.    Physical Exam  BP 107/73   Pulse 80   Ht 5' 2 (1.575 m)   Wt 203 lb (92.1 kg)   SpO2 96%   BMI 37.13 kg/m   GEN: A/Ox3; pleasant , NAD, well nourished.  Speaks in full sentences.   HEENT:  [place]/AT,  EACs-clear, TMs-wnl, NOSE-clear, THROAT-clear, no lesions, no postnasal drip or exudate noted.   NECK:  Supple w/ fair ROM; no JVD; normal carotid impulses w/o bruits; no thyromegaly or nodules palpated; no lymphadenopathy.    RESP  Clear but diminished P & A; w/o, wheezes/ rales/ or rhonchi. no accessory muscle use, no dullness to percussion  CARD:  RRR, no m/r/g, no peripheral edema, pulses intact, no cyanosis or clubbing.  GI:   Soft & nt; nml bowel sounds; no organomegaly or masses detected.   Musco: Warm bil, no deformities or joint swelling noted.   Neuro: alert, no focal deficits noted.    Skin: Warm, no lesions or rashes    Lab Results:  CBC    Component Value Date/Time   WBC 14.7 (H) 10/09/2023 0814   RBC 4.24 10/09/2023 0814   HGB 12.7 10/09/2023 0814   HGB 15.3 08/11/2022 1518   HCT 39.0 10/09/2023 0814   HCT 44.3 08/11/2022 1518   PLT 272 10/09/2023 0814   PLT 283 08/11/2022 1518   MCV 92.0 10/09/2023 0814   MCV 87 08/11/2022 1518   MCH 30.0 10/09/2023 0814   MCHC 32.6 10/09/2023 0814   RDW 13.9 10/09/2023 0814   RDW 12.9  08/11/2022 1518   LYMPHSABS 1.6 10/07/2023 2127   LYMPHSABS 1.8 08/11/2022 1518   MONOABS 0.6 10/07/2023 2127   EOSABS 0.2 10/07/2023 2127   EOSABS 0.1 08/11/2022 1518   BASOSABS 0.0 10/07/2023 2127   BASOSABS 0.0 08/11/2022 1518    BMET    Component Value Date/Time   NA 135 10/09/2023 0444   NA 139 08/11/2022 1518   K 4.8 10/09/2023 0444   CL 102 10/09/2023 0444   CO2 25 10/09/2023 0444   GLUCOSE 137 (H) 10/09/2023 0444   BUN 20 10/09/2023 0444   BUN 17 08/11/2022 1518   CREATININE 0.60 10/09/2023 0444   CREATININE 0.85 10/22/2015 0949   CALCIUM  8.3 (L) 10/09/2023 0444   GFRNONAA >60 10/09/2023 0444   GFRAA 102 06/28/2019 0000    BNP    Component Value Date/Time   BNP 70.0 10/07/2023 2127    ProBNP    Component Value Date/Time   PROBNP 494 (H) 08/03/2018 1003    Imaging: No results found.  Administration History     None          Latest Ref Rng & Units 05/08/2015    2:15 PM  PFT Results  FVC-Pre L 2.12   FVC-Predicted Pre % 61   FVC-Post L 2.25   FVC-Predicted Post % 65   Pre FEV1/FVC % % 33   Post FEV1/FCV % % 42   FEV1-Pre L 0.69   FEV1-Predicted Pre % 25   FEV1-Post L 0.95   DLCO uncorrected ml/min/mmHg 19.65   DLCO UNC% % 80   DLCO corrected ml/min/mmHg 19.47   DLCO COR %Predicted % 80   DLVA Predicted %  84   TLC L 6.37   TLC % Predicted % 126   RV % Predicted % 218     No results found for: NITRICOXIDE   Assessment & Plan:   Assessment & Plan COPD with chronic bronchitis and emphysema (HCC)  OSA (obstructive sleep apnea)  Chronic respiratory failure with hypoxia (HCC)  Assessment and Plan Assessment & Plan Chronic obstructive pulmonary disease Persistent dyspnea with recent exacerbation. Breztri  and Dupixent  show some benefit. Ohtuvayre  ineffective.  - Continue Breztri  two puffs twice daily. - Continue Dupixent  injections every two weeks. - Discontinued Ohtuvayre  - [facility]  [place] - Scheduled for follow-up in four months or sooner if issues arise. -  Discussed Zephyr valve, but patient is not interested currently, nor does she want to repeat PFTs.  Plan to continue Dupixent  and Breztri ; readdress Zephyr valve if these prove ineffective.   Return in about 4 months (around 06/19/2024) for COPD; dupixent .  Candis Dandy, PA-C 02/19/2024      [1]  Allergies Allergen Reactions   Alpha-Gal Other (See Comments)    Red meat   Beta Adrenergic Blockers Anaphylaxis   Doxycycline  Swelling and Other (See Comments)    Dizziness, sleepy, talking out of my head, almost caused liver failure   Vancomycin  Swelling and Rash    Severe swelling   Avelox [Moxifloxacin Hcl In Nacl] Rash   Ciprofloxacin Nausea And Vomiting   Cleocin [Clindamycin Hcl] Rash   Moxifloxacin Rash   Sulfamethoxazole-Trimethoprim Rash   Tape Other (See Comments)    White clear itches, rash ONLY use paper tape  [2]  Social History Tobacco Use  Smoking Status Former   Current packs/day: 0.00   Average packs/day: 0.1 packs/day for 30.0 years (3.0 ttl pk-yrs)   Types: Cigarettes   Start date: 04/28/1985   Quit date: 04/29/2015   Years since quitting: 8.8  Smokeless Tobacco Never   "

## 2024-02-19 NOTE — Patient Instructions (Signed)
 Stop Ohtuvayre .  Continue Breztri , Xopenex , Albuterol .  Continue Dupixent  shots every 2 weeks.

## 2024-02-20 ENCOUNTER — Other Ambulatory Visit: Payer: Self-pay

## 2024-02-20 NOTE — Progress Notes (Signed)
 Specialty Pharmacy Refill Coordination Note  Lindsay Bonilla is a 63 y.o. female contacted today regarding refills of specialty medication(s) Dupilumab  (Dupixent )   Patient requested Delivery   Delivery date: 03/05/24   Verified address: [address] [place] [address]   Medication will be filled on: 03/04/24

## 2024-03-04 ENCOUNTER — Other Ambulatory Visit: Payer: Self-pay

## 2024-03-12 ENCOUNTER — Ambulatory Visit: Attending: Cardiovascular Disease | Admitting: *Deleted

## 2024-03-12 DIAGNOSIS — Z5181 Encounter for therapeutic drug level monitoring: Secondary | ICD-10-CM | POA: Insufficient documentation

## 2024-03-12 DIAGNOSIS — I4892 Unspecified atrial flutter: Secondary | ICD-10-CM | POA: Diagnosis present

## 2024-03-12 LAB — POCT INR: INR: 3 (ref 2.0–3.0)

## 2024-03-12 NOTE — Patient Instructions (Signed)
 Continue taking 1 tablet daily except for 1/2 a tablet on Mondays, Wednesdays and Fridays. Recheck in 6 wks Call [phone] for questions or concerns.

## 2024-03-12 NOTE — Progress Notes (Signed)
 INR 3.0; Please see anticoagulation encounter

## 2024-03-22 ENCOUNTER — Other Ambulatory Visit: Payer: Self-pay

## 2024-03-22 ENCOUNTER — Other Ambulatory Visit: Payer: Self-pay | Admitting: Cardiovascular Disease

## 2024-03-25 ENCOUNTER — Other Ambulatory Visit (HOSPITAL_COMMUNITY): Payer: Self-pay

## 2024-03-25 ENCOUNTER — Encounter (INDEPENDENT_AMBULATORY_CARE_PROVIDER_SITE_OTHER): Payer: Self-pay

## 2024-03-26 ENCOUNTER — Other Ambulatory Visit (HOSPITAL_COMMUNITY): Payer: Self-pay

## 2024-03-26 NOTE — Progress Notes (Signed)
 Specialty Pharmacy Refill Coordination Note  Lindsay Bonilla is a 64 y.o. female contacted today regarding refills of specialty medication(s) Dupilumab  (Dupixent )   Patient requested Delivery   Delivery date: 03/29/24   Verified address: [address] [place] [address]   Medication will be filled on: 03/28/24

## 2024-03-28 ENCOUNTER — Other Ambulatory Visit: Payer: Self-pay

## 2024-04-23 ENCOUNTER — Ambulatory Visit

## 2024-06-17 ENCOUNTER — Ambulatory Visit (HOSPITAL_BASED_OUTPATIENT_CLINIC_OR_DEPARTMENT_OTHER)
